# Patient Record
Sex: Male | Born: 1970 | Race: Black or African American | Hispanic: No | Marital: Married | Smoking: Never smoker
Health system: Southern US, Community
[De-identification: ages and names within clinical notes are randomized; demographics above are authoritative.]

## PROBLEM LIST (undated history)

## (undated) DIAGNOSIS — M1990 Unspecified osteoarthritis, unspecified site: Secondary | ICD-10-CM

## (undated) DIAGNOSIS — N319 Neuromuscular dysfunction of bladder, unspecified: Secondary | ICD-10-CM

## (undated) DIAGNOSIS — R7989 Other specified abnormal findings of blood chemistry: Secondary | ICD-10-CM

## (undated) DIAGNOSIS — F32A Depression, unspecified: Secondary | ICD-10-CM

## (undated) DIAGNOSIS — G709 Myoneural disorder, unspecified: Secondary | ICD-10-CM

## (undated) DIAGNOSIS — R519 Headache, unspecified: Secondary | ICD-10-CM

## (undated) DIAGNOSIS — K219 Gastro-esophageal reflux disease without esophagitis: Secondary | ICD-10-CM

## (undated) DIAGNOSIS — E119 Type 2 diabetes mellitus without complications: Secondary | ICD-10-CM

## (undated) DIAGNOSIS — R748 Abnormal levels of other serum enzymes: Secondary | ICD-10-CM

## (undated) DIAGNOSIS — G35 Multiple sclerosis: Secondary | ICD-10-CM

## (undated) DIAGNOSIS — R51 Headache: Secondary | ICD-10-CM

## (undated) DIAGNOSIS — R251 Tremor, unspecified: Secondary | ICD-10-CM

## (undated) HISTORY — PX: TESTICLE REMOVAL: SHX68

## (undated) HISTORY — PX: KNEE SURGERY: SHX244

## (undated) HISTORY — DX: Other specified abnormal findings of blood chemistry: R79.89

## (undated) HISTORY — DX: Gastro-esophageal reflux disease without esophagitis: K21.9

## (undated) HISTORY — PX: HERNIA REPAIR: SHX51

## (undated) HISTORY — PX: COLONOSCOPY: SHX174

## (undated) HISTORY — DX: Unspecified osteoarthritis, unspecified site: M19.90

---

## 2003-06-23 ENCOUNTER — Encounter: Payer: Self-pay | Admitting: Emergency Medicine

## 2003-06-23 ENCOUNTER — Emergency Department (HOSPITAL_COMMUNITY): Admission: EM | Admit: 2003-06-23 | Discharge: 2003-06-23 | Payer: Self-pay | Admitting: Emergency Medicine

## 2008-05-28 ENCOUNTER — Ambulatory Visit: Payer: Self-pay | Admitting: Urology

## 2009-11-29 ENCOUNTER — Emergency Department (HOSPITAL_COMMUNITY): Admission: EM | Admit: 2009-11-29 | Discharge: 2009-11-29 | Payer: Self-pay | Admitting: Emergency Medicine

## 2009-12-04 ENCOUNTER — Emergency Department (HOSPITAL_COMMUNITY): Admission: EM | Admit: 2009-12-04 | Discharge: 2009-12-04 | Payer: Self-pay | Admitting: Emergency Medicine

## 2009-12-04 IMAGING — CT CT MAXILLOFACIAL W/ CM
3 of 4 series · 15 of 47 positions shown, 18 images · non-contrast
Comparison: None.

CT HEAD

CLINICAL DATA: Headache.  Weakness.  Pain.  Maxillary pain.
Immunosuppression.

CT HEAD WITHOUT CONTRAST
CT MAXILLOFACIAL WITHOUT CONTRAST
TECHNIQUE: Multidetector CT imaging of the head and maxillofacial
structures were performed using the standard protocol without
intravenous contrast. Multiplanar CT image reconstructions of the
maxillofacial structures were also generated.

[Series 4: max st 2.0 h31s · axial · 0.37mm/px · z∈[+42,+180]mm · 9 of 87 slices shown, 12 images]
[im 9/87  brain]
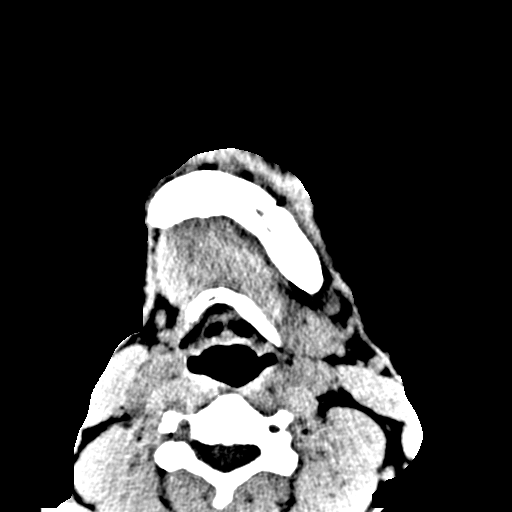
[im 9/87  bone]
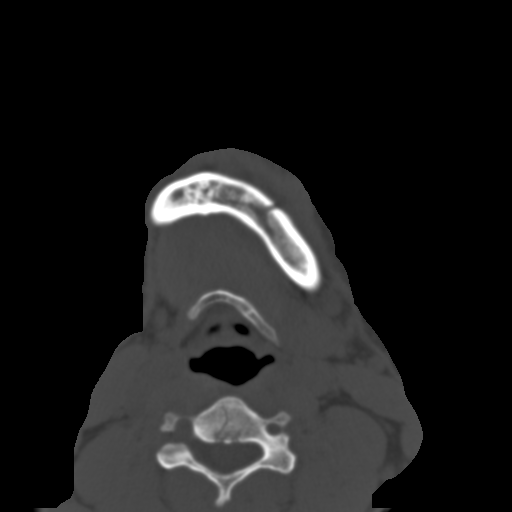
[im 18/87  bone]
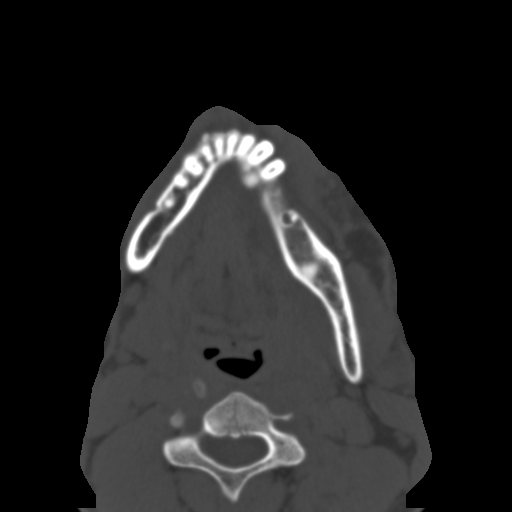
[im 26/87  bone]
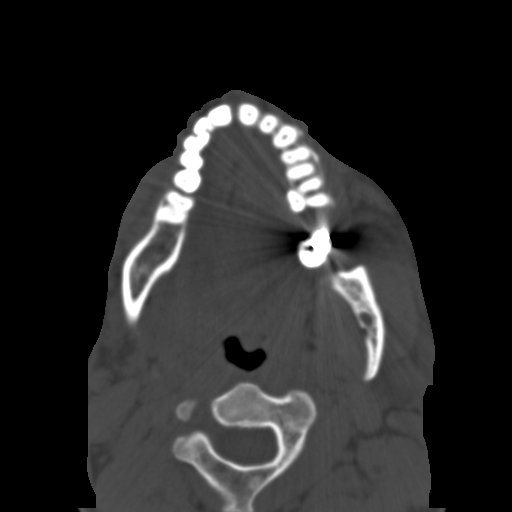
[im 35/87  bone]
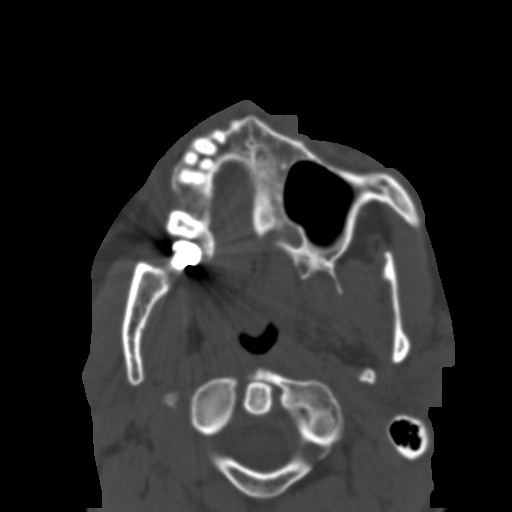
[im 44/87  brain]
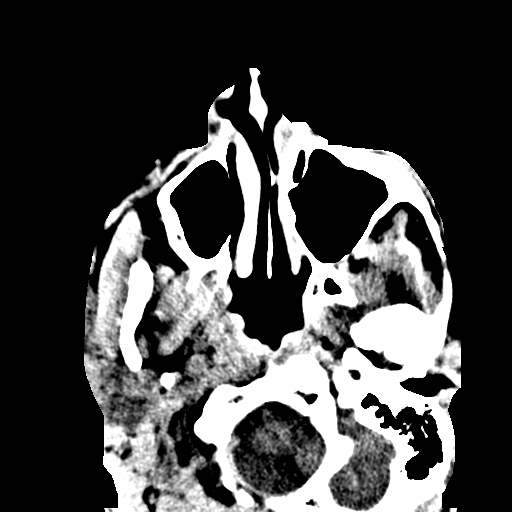
[im 44/87  bone]
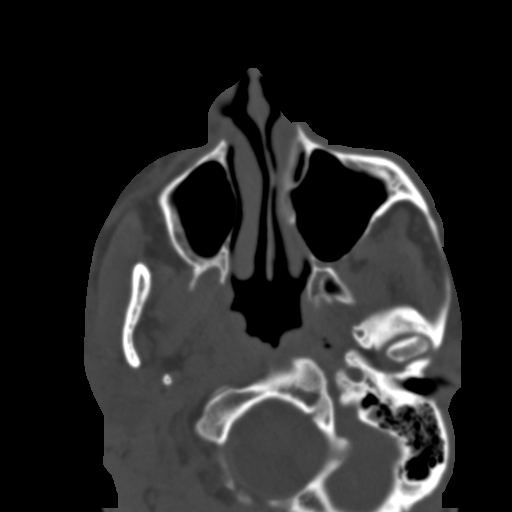
[im 52/87  bone]
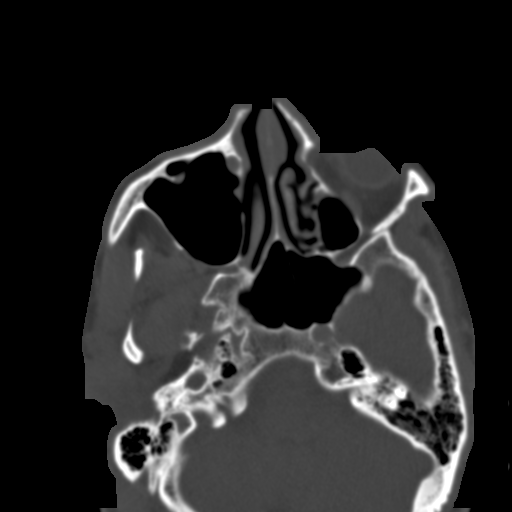
[im 61/87  bone]
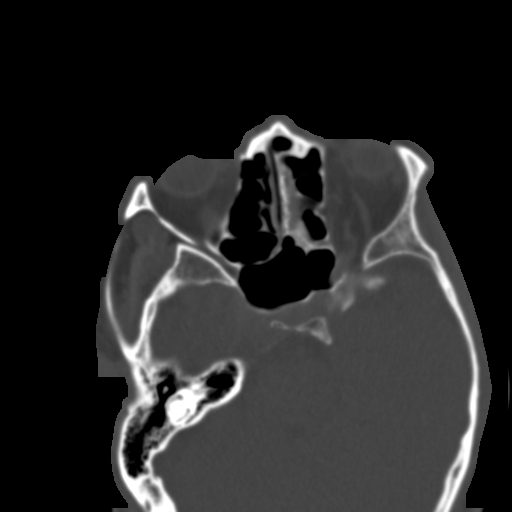
[im 69/87  bone]
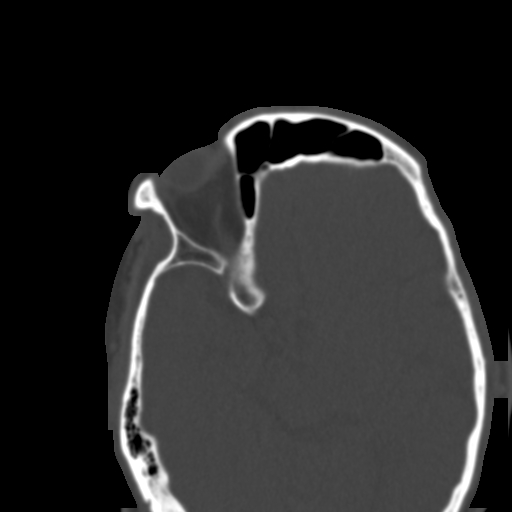
[im 78/87  brain]
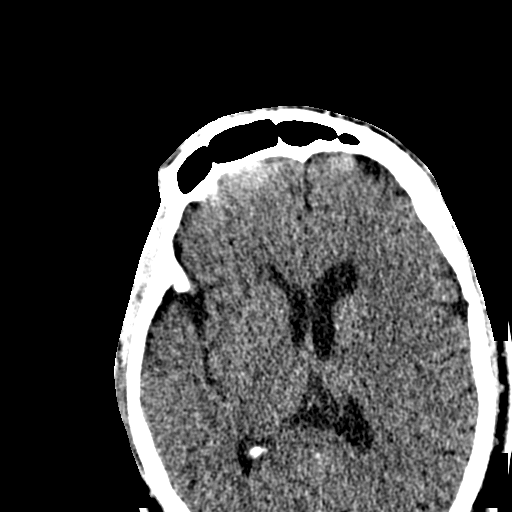
[im 78/87  bone]
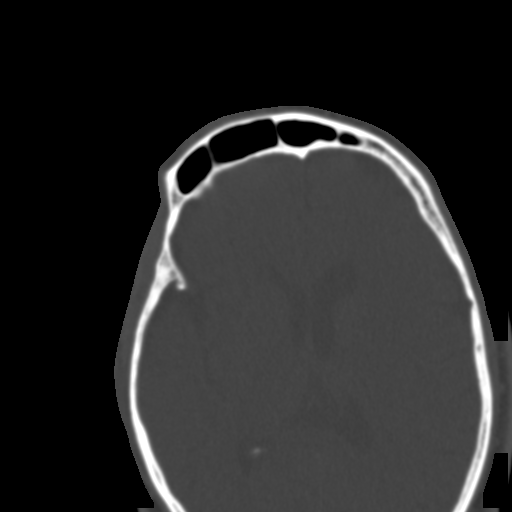

[Series 6: max st coronal · coronal · 0.33mm/px · 3 of 63 slices shown]
[im 21/63  bone]
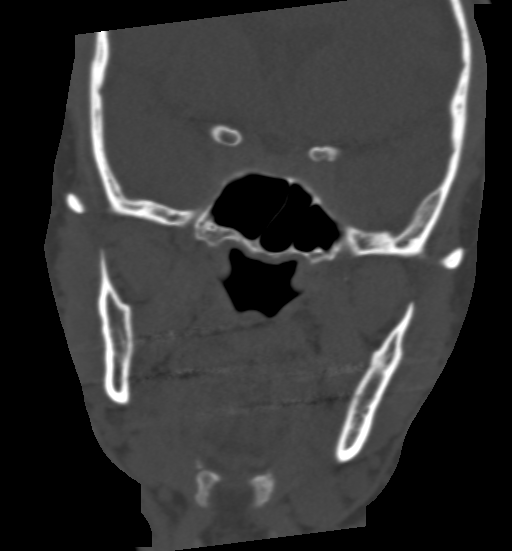
[im 28/63  bone]
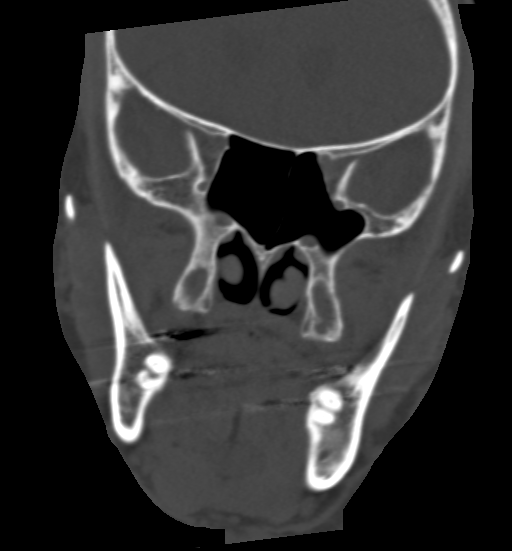
[im 35/63  bone]
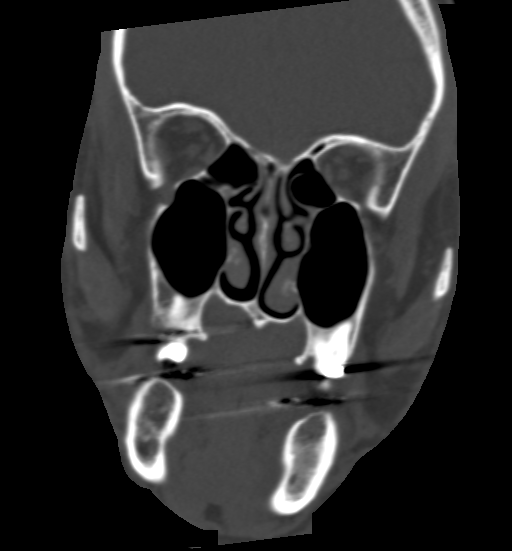

[Series 8: max st sag · sagittal · 0.27mm/px · 3 of 76 slices shown]
[im 30/76  bone]
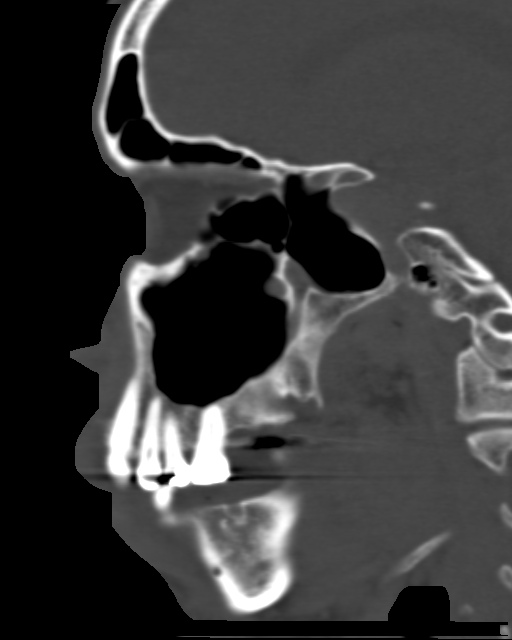
[im 38/76  bone]
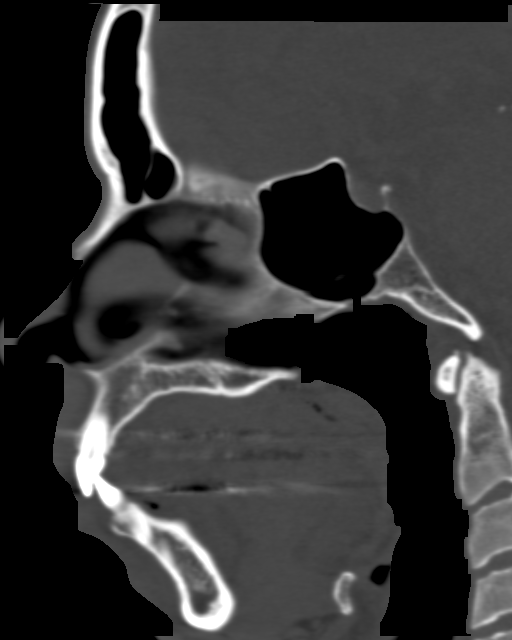
[im 46/76  bone]
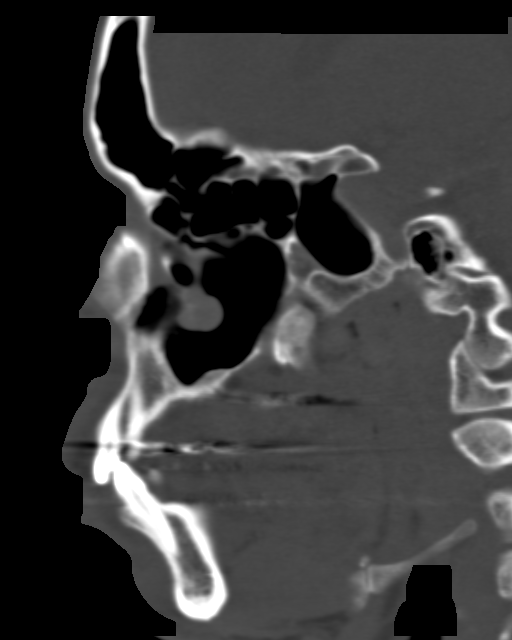

[15 of 47 positions shown; findings below may reference images not displayed]

FINDINGS: Small white matter lesion is present in the anterior
right frontal lobe.   No mass lesion, mass effect, midline shift,
hydrocephalus, hemorrhage.  No territorial ischemia or acute
infarction. Paranasal sinuses appear within normal limits aside
from right mucous retention cyst or polyp in the maxillary sinus.
IMPRESSION: No acute intracranial abnormality.  Small right frontal white
matter lesion is nonspecific.

CT MAXILLOFACIAL
FINDINGS: Right maxillary mucous retention cyst or polyp is
present.  The frontal sinuses appear clear.  Mild left maxillary
mucosal thickening.  Sphenoid sinuses unremarkable.  Mastoid air
cells clear.  No significant inflammatory changes.  Evaluation of
adenopathy suboptimal secondary to noncontrast technique.  Dental
artifact is present over the mandible and maxilla.  No osteolysis.
No focal inflammatory changes or masses identified.
IMPRESSION: No acute abnormality on the unenhanced CT maxillofacial. Paranasal
sinus disease is mild.

## 2010-08-12 ENCOUNTER — Emergency Department (HOSPITAL_COMMUNITY): Admission: EM | Admit: 2010-08-12 | Discharge: 2010-08-12 | Payer: Self-pay | Admitting: Emergency Medicine

## 2010-08-12 IMAGING — CT CT ABD-PELV W/ CM
2 of 4 series · 16 of 46 positions shown, 18 images · IV contrast (Omnipaque 300)
Comparison: None

CLINICAL DATA: Right flank and right lower quadrant pain for 2 days

CT ABDOMEN AND PELVIS WITH CONTRAST
TECHNIQUE: Multidetector CT imaging of the abdomen and pelvis was
performed following the standard protocol during bolus
administration of intravenous contrast. Breast shield utilized.
Sagittal and coronal MPR images reconstructed from axial data set.
Contrast: Dilute oral contrast and 100 ml [X8] IV

[Series 2: abd_pel_with 5.0 b40f · axial · 0.71mm/px · z∈[-502,-97]mm · 13 of 89 slices shown, 15 images]
[im 4/89  soft-tissue]
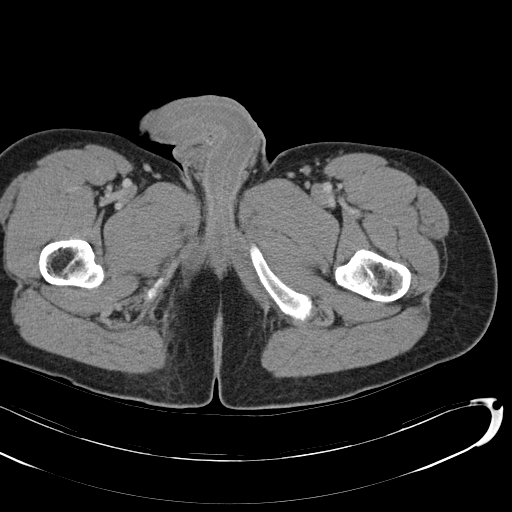
[im 4/89  bone]
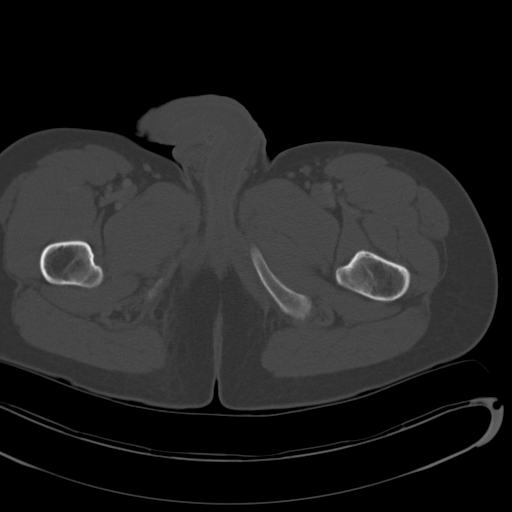
[im 11/89  soft-tissue]
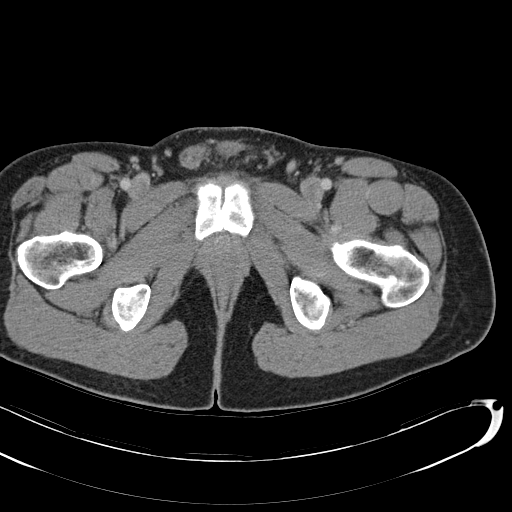
[im 17/89  soft-tissue]
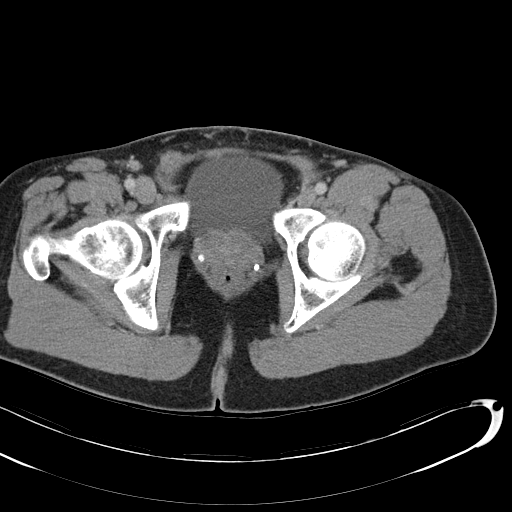
[im 24/89  soft-tissue]
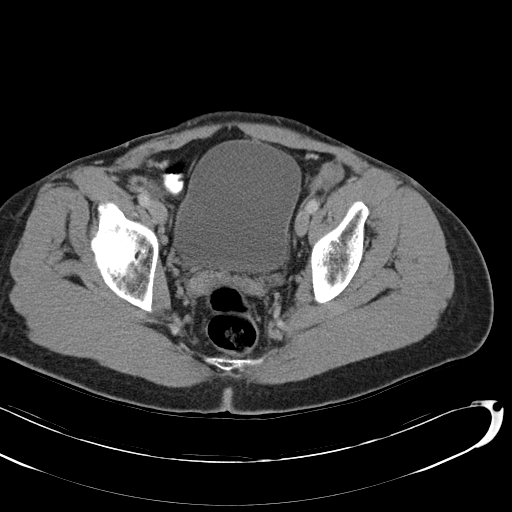
[im 31/89  soft-tissue]
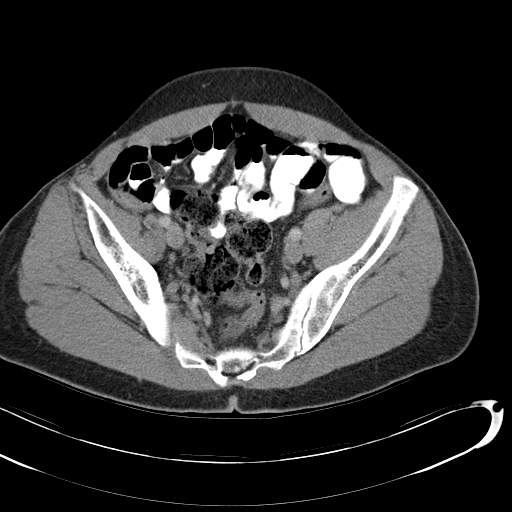
[im 38/89  soft-tissue]
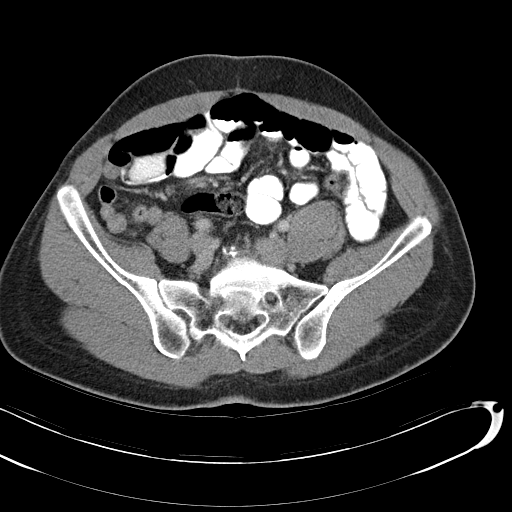
[im 45/89  soft-tissue]
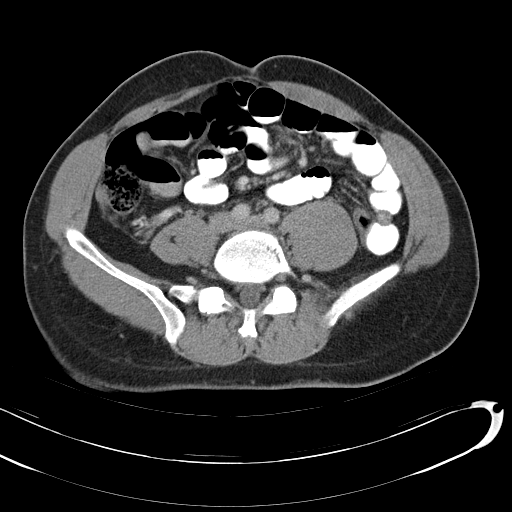
[im 51/89  soft-tissue]
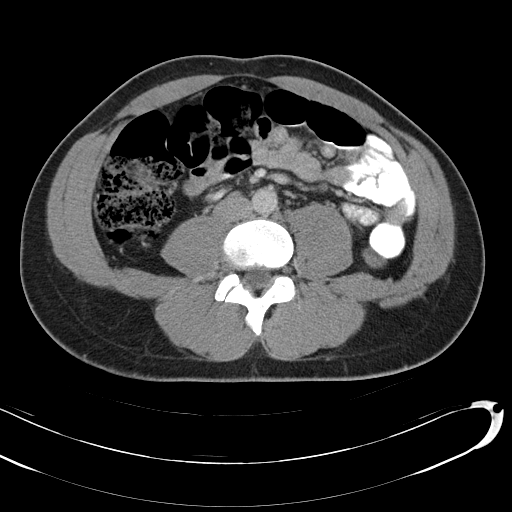
[im 58/89  soft-tissue]
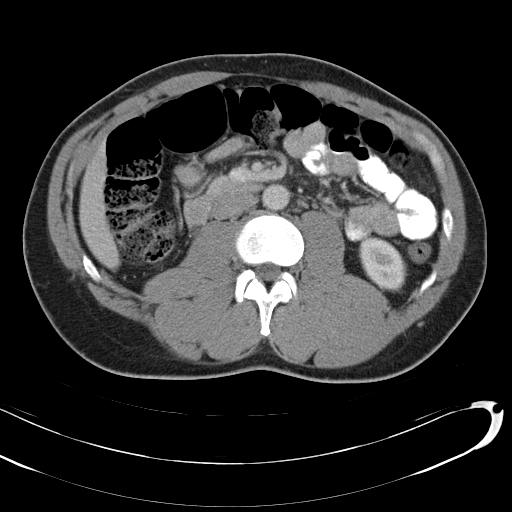
[im 58/89  bone]
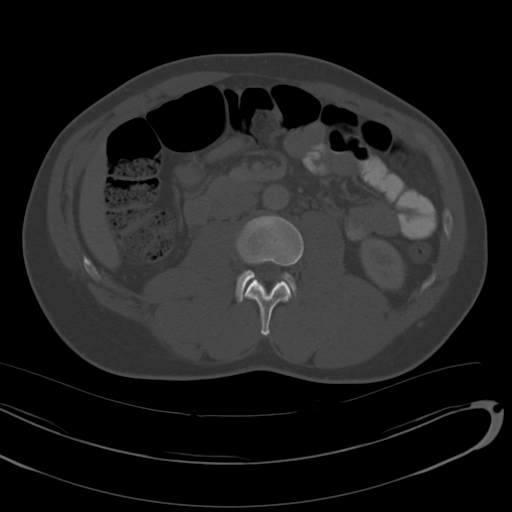
[im 65/89  soft-tissue]
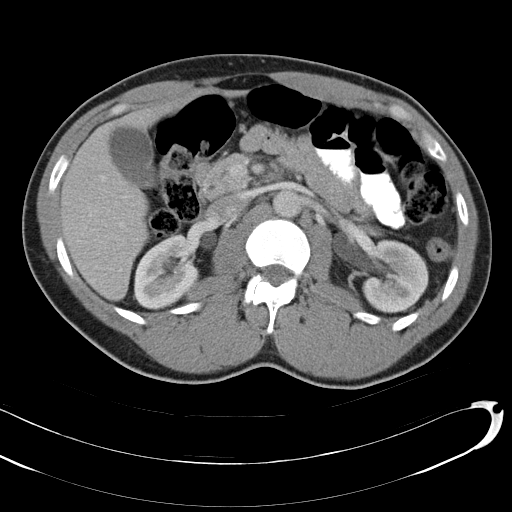
[im 72/89  soft-tissue]
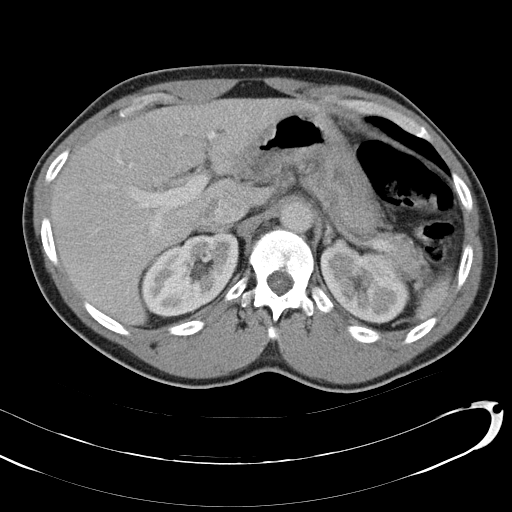
[im 78/89  soft-tissue]
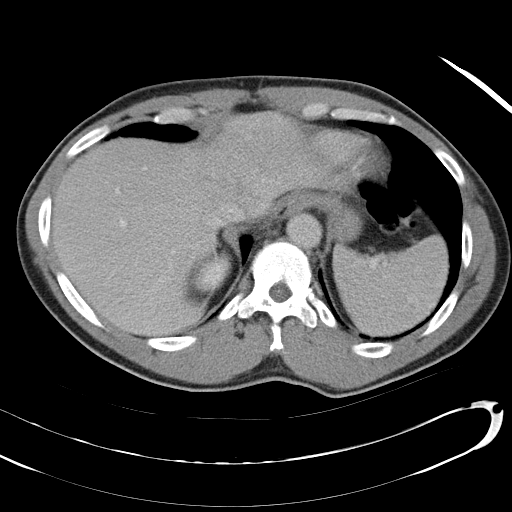
[im 85/89  soft-tissue]
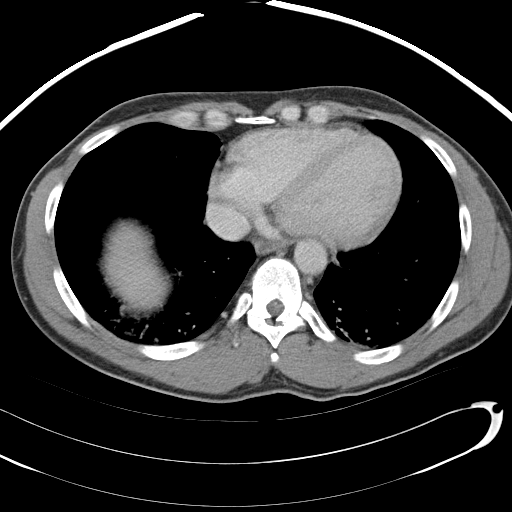

[Series 4: abd_pel_with 3.0 spo cor · coronal · 0.68mm/px · 3 of 80 slices shown]
[im 27/80  soft-tissue]
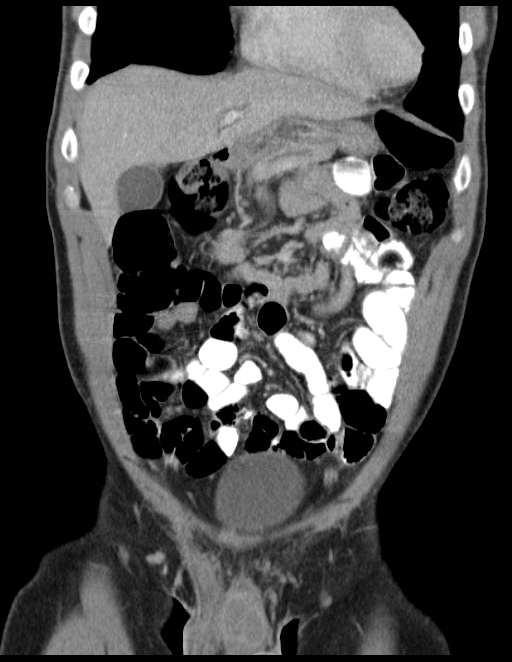
[im 36/80  soft-tissue]
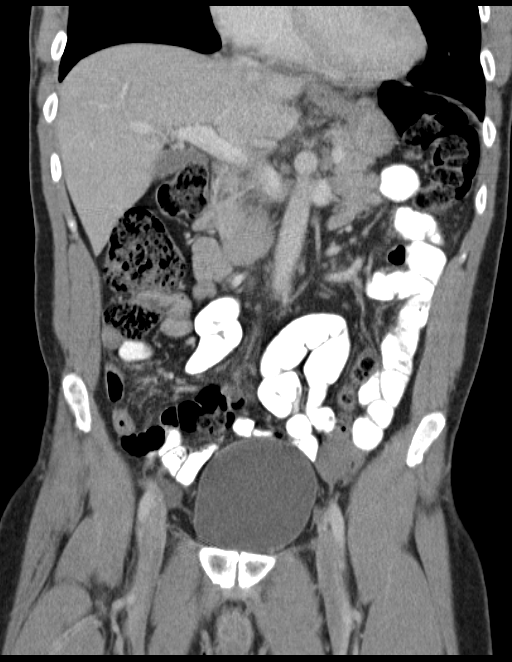
[im 44/80  soft-tissue]
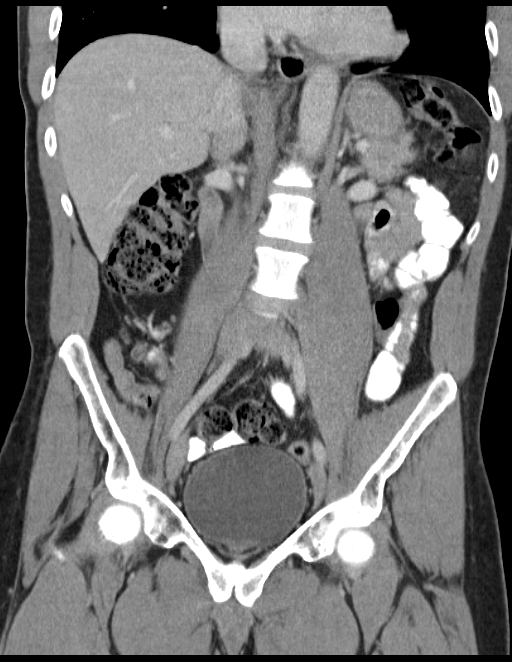

[16 of 46 positions shown; findings below may reference images not displayed]

FINDINGS: Dependent atelectasis bilateral lung bases.
Liver, spleen, pancreas, kidneys, and adrenal glands normal
appearance.
Distal ureters and bladder unremarkable.
Normal appendix, retrocecal in position.
Small umbilical hernia containing fat.
Single small bowel loop in the right lower quadrant shows
questionable wall thickening versus underdistension artifact.

Small collection of low attenuation fluid identified deep to right
internal inguinal ring, 2.8 x 1.3 cm image 68, with minimal
stranding at the inguinal fascial planes, question prior hernia
repair.
Soft tissue nodules identified in the left lower quadrant/mid left
pelvis, largest 3.0 x 2.4 cm image 63, question enlarged left
external iliac lymph nodes; these are not of fluid attenuation and
are not felt represent cysts.
Scattered pelvic phleboliths.
No additional mass, free fluid or inflammatory process.
No acute osseous findings.
IMPRESSION: Small focal fluid collection deep to right internal inguinal ring,
question prior hernia repair.
Suspect enlarged left external iliac lymph nodes, uncertain
etiology; consider follow-up imaging in 4-6 months to assess
stability.
Segment of questionable small bowel wall thickening of a loop of
distal ileum, cannot exclude infection or inflammatory bowel
disease versus artifact from underdistension.
Bibasilar atelectasis.
Small umbilical hernia containing fat.

## 2010-12-09 LAB — DIFFERENTIAL
Basophils Absolute: 0 10*3/uL (ref 0.0–0.1)
Basophils Relative: 0 % (ref 0–1)
Eosinophils Absolute: 0 10*3/uL (ref 0.0–0.7)
Eosinophils Relative: 0 % (ref 0–5)
Lymphocytes Relative: 3 % — ABNORMAL LOW (ref 12–46)
Lymphs Abs: 0.1 10*3/uL — ABNORMAL LOW (ref 0.7–4.0)
Monocytes Absolute: 0.5 10*3/uL (ref 0.1–1.0)
Monocytes Relative: 10 % (ref 3–12)
Neutro Abs: 4.8 10*3/uL (ref 1.7–7.7)
Neutrophils Relative %: 87 % — ABNORMAL HIGH (ref 43–77)

## 2010-12-09 LAB — CBC
HCT: 38.9 % — ABNORMAL LOW (ref 39.0–52.0)
Hemoglobin: 12.8 g/dL — ABNORMAL LOW (ref 13.0–17.0)
MCH: 28.4 pg (ref 26.0–34.0)
MCHC: 32.9 g/dL (ref 30.0–36.0)
MCV: 86.2 fL (ref 78.0–100.0)
Platelets: 161 10*3/uL (ref 150–400)
RBC: 4.52 MIL/uL (ref 4.22–5.81)
RDW: 14.4 % (ref 11.5–15.5)
WBC: 5.4 10*3/uL (ref 4.0–10.5)

## 2010-12-09 LAB — URINALYSIS, ROUTINE W REFLEX MICROSCOPIC
Bilirubin Urine: NEGATIVE
Glucose, UA: NEGATIVE mg/dL
Hgb urine dipstick: NEGATIVE
Ketones, ur: NEGATIVE mg/dL
Nitrite: NEGATIVE
Protein, ur: NEGATIVE mg/dL
Specific Gravity, Urine: 1.015 (ref 1.005–1.030)
Urobilinogen, UA: 2 mg/dL — ABNORMAL HIGH (ref 0.0–1.0)
pH: 6.5 (ref 5.0–8.0)

## 2010-12-09 LAB — BASIC METABOLIC PANEL
BUN: 13 mg/dL (ref 6–23)
CO2: 28 mEq/L (ref 19–32)
Calcium: 9.3 mg/dL (ref 8.4–10.5)
Chloride: 106 mEq/L (ref 96–112)
Creatinine, Ser: 1.49 mg/dL (ref 0.4–1.5)
GFR calc Af Amer: >60 mL/min (ref >60)
GFR calc non Af Amer: 53 mL/min — ABNORMAL LOW (ref >60)
Glucose, Bld: 92 mg/dL (ref 70–99)
Potassium: 4.2 mEq/L (ref 3.5–5.1)
Sodium: 140 mEq/L (ref 135–145)

## 2010-12-21 LAB — POCT I-STAT, CHEM 8
BUN: 13 mg/dL (ref 6–23)
Calcium, Ion: 1.15 mmol/L (ref 1.12–1.32)
Chloride: 105 mEq/L (ref 96–112)
Creatinine, Ser: 1.3 mg/dL (ref 0.4–1.5)
Glucose, Bld: 104 mg/dL — ABNORMAL HIGH (ref 70–99)
HCT: 44 % (ref 39.0–52.0)
Hemoglobin: 15 g/dL (ref 13.0–17.0)
Potassium: 3.7 mEq/L (ref 3.5–5.1)
Sodium: 138 mEq/L (ref 135–145)
TCO2: 26 mmol/L (ref 0–100)

## 2011-01-13 ENCOUNTER — Emergency Department (HOSPITAL_COMMUNITY)
Admission: EM | Admit: 2011-01-13 | Discharge: 2011-01-14 | Disposition: A | Discharge status: Home / Self Care | Payer: Medicare Other | Attending: Emergency Medicine | Admitting: Emergency Medicine

## 2011-01-13 DIAGNOSIS — Z79899 Other long term (current) drug therapy: Secondary | ICD-10-CM | POA: Insufficient documentation

## 2011-01-13 DIAGNOSIS — R300 Dysuria: Secondary | ICD-10-CM | POA: Insufficient documentation

## 2011-01-13 DIAGNOSIS — G35 Multiple sclerosis: Secondary | ICD-10-CM | POA: Insufficient documentation

## 2011-01-14 LAB — URINALYSIS, ROUTINE W REFLEX MICROSCOPIC
Bilirubin Urine: NEGATIVE
Glucose, UA: NEGATIVE mg/dL
Hgb urine dipstick: NEGATIVE
Nitrite: NEGATIVE
Protein, ur: NEGATIVE mg/dL
Specific Gravity, Urine: >1.03 — ABNORMAL HIGH (ref 1.005–1.030)
Urobilinogen, UA: 1 mg/dL (ref 0.0–1.0)
pH: 6 (ref 5.0–8.0)

## 2011-11-13 DIAGNOSIS — R3915 Urgency of urination: Secondary | ICD-10-CM | POA: Insufficient documentation

## 2011-11-13 DIAGNOSIS — N319 Neuromuscular dysfunction of bladder, unspecified: Secondary | ICD-10-CM | POA: Insufficient documentation

## 2012-01-13 DIAGNOSIS — L219 Seborrheic dermatitis, unspecified: Secondary | ICD-10-CM | POA: Insufficient documentation

## 2012-01-13 DIAGNOSIS — L709 Acne, unspecified: Secondary | ICD-10-CM | POA: Insufficient documentation

## 2012-09-28 HISTORY — PX: COLONOSCOPY: SHX174

## 2012-10-11 DIAGNOSIS — H5509 Other forms of nystagmus: Secondary | ICD-10-CM | POA: Insufficient documentation

## 2012-10-11 DIAGNOSIS — Z79899 Other long term (current) drug therapy: Secondary | ICD-10-CM | POA: Insufficient documentation

## 2013-01-29 DIAGNOSIS — M25561 Pain in right knee: Secondary | ICD-10-CM | POA: Insufficient documentation

## 2013-03-04 ENCOUNTER — Emergency Department (HOSPITAL_COMMUNITY)
Admission: EM | Admit: 2013-03-04 | Discharge: 2013-03-04 | Disposition: A | Discharge status: Home / Self Care | Payer: Medicare Other | Attending: Emergency Medicine | Admitting: Emergency Medicine

## 2013-03-04 ENCOUNTER — Encounter (HOSPITAL_COMMUNITY): Payer: Self-pay | Admitting: *Deleted

## 2013-03-04 DIAGNOSIS — S0180XA Unspecified open wound of other part of head, initial encounter: Secondary | ICD-10-CM | POA: Insufficient documentation

## 2013-03-04 DIAGNOSIS — Y9239 Other specified sports and athletic area as the place of occurrence of the external cause: Secondary | ICD-10-CM | POA: Insufficient documentation

## 2013-03-04 DIAGNOSIS — Y92838 Other recreation area as the place of occurrence of the external cause: Secondary | ICD-10-CM | POA: Insufficient documentation

## 2013-03-04 DIAGNOSIS — Z8669 Personal history of other diseases of the nervous system and sense organs: Secondary | ICD-10-CM | POA: Insufficient documentation

## 2013-03-04 DIAGNOSIS — W208XXA Other cause of strike by thrown, projected or falling object, initial encounter: Secondary | ICD-10-CM | POA: Insufficient documentation

## 2013-03-04 DIAGNOSIS — S0181XA Laceration without foreign body of other part of head, initial encounter: Secondary | ICD-10-CM

## 2013-03-04 DIAGNOSIS — Y93B1 Activity, exercise machines primarily for muscle strengthening: Secondary | ICD-10-CM | POA: Insufficient documentation

## 2013-03-04 HISTORY — DX: Headache: R51

## 2013-03-04 HISTORY — DX: Tremor, unspecified: R25.1

## 2013-03-04 HISTORY — DX: Multiple sclerosis: G35

## 2013-03-04 HISTORY — DX: Headache, unspecified: R51.9

## 2013-03-04 MED ORDER — LIDOCAINE HCL (PF) 1 % IJ SOLN
5.0000 mL | Freq: Once | INTRAMUSCULAR | Status: AC
  Administered 2013-03-04: 5 mL
  Filled 2013-03-04: qty 5

## 2013-03-04 MED ORDER — HYDROCODONE-ACETAMINOPHEN 5-325 MG PO TABS: 1.0000 | ORAL_TABLET | ORAL | Status: DC | PRN

## 2013-03-04 MED ORDER — LIDOCAINE-EPINEPHRINE-TETRACAINE (LET) SOLUTION
3.0000 mL | Freq: Once | NASAL | Status: AC
  Administered 2013-03-04: 3 mL via TOPICAL
  Filled 2013-03-04: qty 3

## 2013-03-04 NOTE — ED Notes (Signed)
Laceration to chin after weights fell on chin.  Gauze applied at this time with bleeding.

## 2013-03-04 NOTE — ED Provider Notes (Signed)
History     CSN: [id number]  Arrival date & time 03/04/13  1126   First MD Initiated Contact with Patient 03/04/13 1220      Chief Complaint  Patient presents with  . Laceration    (Consider location/radiation/quality/duration/timing/severity/associated sxs/prior treatment) HPI JEDREK DINOVO is a 42 y.o. male who presents to the ED with a laceration to his chin. He went to his doctor this morning and they told him to come to the ED. He has MS and goes to the Gym every day to try and keep his muscles working. Today he was working with a machine and it fell against is chin and caused a laceration. He is not sure if his teeth went through his chin or not. He denies any other injuries.   Past Medical History  Diagnosis Date  . MS (multiple sclerosis)   . Headache   . Tremor     Past Surgical History  Procedure Laterality Date  . Knee surgery    . Hernia repair      No family history on file.  History  Substance Use Topics  . Smoking status: Never Smoker   . Smokeless tobacco: Not on file  . Alcohol Use: No      Review of Systems  Constitutional: Negative for fever and chills.  HENT: Negative for neck pain.   Respiratory: Negative for chest tightness and shortness of breath.   Gastrointestinal: Negative for nausea and vomiting.  Musculoskeletal:       Hx of MS  Skin: Positive for wound.  Psychiatric/Behavioral: The patient is not nervous/anxious.     Allergies  Celebrex; Celexa; and Vioxx  Home Medications  No current outpatient prescriptions on file.  BP 119/96  Pulse 72  Temp(Src) 97.4 F (36.3 C) (Oral)  Resp 14  Ht 6\' 3"  (1.905 m)  Wt 212 lb (96.163 kg)  BMI 26.5 kg/m2  SpO2 100%  Physical Exam  Nursing note and vitals reviewed. Constitutional: He is oriented to person, place, and time. He appears well-developed and well-nourished. No distress.  HENT:  Head: Head is with laceration.    Laceration inside bottom lip, through and through    Eyes: EOM are normal.  Neck: Neck supple.  Cardiovascular: Normal rate.   Pulmonary/Chest: Effort normal.  Musculoskeletal: Normal range of motion.  Neurological: He is alert and oriented to person, place, and time. No cranial nerve deficit.  Skin: Skin is warm and dry.  Psychiatric: He has a normal mood and affect. His behavior is normal.    ED Course  Procedures (including critical care time) LACERATION REPAIR Performed by: Trystin Hargrove Authorized by: Darrill Vreeland Consent: Verbal consent obtained. Risks and benefits: risks, benefits and alternatives were discussed Consent given by: patient Patient identity confirmed: provided demographic data Prepped and Draped in normal sterile fashion Wound explored  Laceration Location: chin   Laceration Length: 1.5 cm  No Foreign Bodies seen or palpated  Anesthesia: local infiltration  Local anesthetic: lidocaine 1% without epinephrine  Anesthetic total: 3 ml  Irrigation method: syringe Amount of cleaning: standard  Skin closure: 5-0 prolene  Number of sutures: 5  Technique: single interrupted  Patient tolerance: Patient tolerated the procedure well with no immediate complications.    MDM: Dr. Adriana Simas in to examine the patient.  42 y.o. male with through and through laceration to chin. He will follow up with his PCP in 5 days for suture removal. He will use warm salt water rinses for the laceration inside the mouth.  He will return for any problems.  Discussed with the patient and all questioned fully answered.   Medication List    TAKE these medications       HYDROcodone-acetaminophen 5-325 MG per tablet  Commonly known as:  NORCO/VICODIN  Take 1 tablet by mouth every 4 (four) hours as needed.               [facility] Orlene Och, NP 03/04/13 1710

## 2013-03-04 NOTE — ED Notes (Signed)
Pt lost balance while holding on to weight machine, machine came down and hit pt's chin with fall, denies hitting head, lac to chin/lower lip that went all the way through

## 2013-03-06 NOTE — ED Provider Notes (Signed)
Medical screening examination/treatment/procedure(s) were conducted as a shared visit with non-physician practitioner(s) and myself.  I personally evaluated the patient during the encounter.  No neuro deficits.  Lac repair per NP  Donnetta Hutching, MD 03/06/13 1125

## 2013-05-16 DIAGNOSIS — Q5310 Unspecified undescended testicle, unilateral: Secondary | ICD-10-CM | POA: Insufficient documentation

## 2014-03-20 DIAGNOSIS — G894 Chronic pain syndrome: Secondary | ICD-10-CM | POA: Insufficient documentation

## 2014-10-12 ENCOUNTER — Ambulatory Visit (HOSPITAL_COMMUNITY)
Admission: RE | Admit: 2014-10-12 | Discharge: 2014-10-12 | Disposition: A | Discharge status: Home / Self Care | Payer: Medicare Other | Source: Ambulatory Visit | Attending: Neurology | Admitting: Neurology

## 2014-10-12 DIAGNOSIS — M25659 Stiffness of unspecified hip, not elsewhere classified: Secondary | ICD-10-CM | POA: Diagnosis not present

## 2014-10-12 DIAGNOSIS — R29898 Other symptoms and signs involving the musculoskeletal system: Secondary | ICD-10-CM

## 2014-10-12 DIAGNOSIS — R198 Other specified symptoms and signs involving the digestive system and abdomen: Secondary | ICD-10-CM

## 2014-10-12 DIAGNOSIS — R2689 Other abnormalities of gait and mobility: Secondary | ICD-10-CM | POA: Insufficient documentation

## 2014-10-12 DIAGNOSIS — Z9181 History of falling: Secondary | ICD-10-CM | POA: Diagnosis not present

## 2014-10-12 DIAGNOSIS — M6281 Muscle weakness (generalized): Secondary | ICD-10-CM | POA: Insufficient documentation

## 2014-10-12 DIAGNOSIS — G35 Multiple sclerosis: Secondary | ICD-10-CM | POA: Insufficient documentation

## 2014-10-12 NOTE — Therapy (Signed)
[facility] [address] [place], [place], [address] Phone: [phone]   Fax:  [phone]  Physical Therapy Evaluation  Patient Details  Name: Vincent Anthony MRN: [id number] Date of Birth: 10-20-1970 Referring Provider:  Harlen Labs, MD  Encounter Date: 10/12/2014      PT End of Session - 10/12/14 1537    Visit Number 1   Number of Visits 16   Date for PT Re-Evaluation 11/11/14   Authorization Type medicare   Authorization - Visit Number 1   Authorization - Number of Visits 16   PT Start Time 1445   PT Stop Time 1530   PT Time Calculation (min) 45 min   Equipment Utilized During Treatment Gait belt   Activity Tolerance Patient tolerated treatment well   Behavior During Therapy [facility] for tasks assessed/performed      Past Medical History  Diagnosis Date  . MS (multiple sclerosis)   . Headache   . Tremor     Past Surgical History  Procedure Laterality Date  . Knee surgery    . Hernia repair      There were no vitals taken for this visit.  Visit Diagnosis:  Multiple sclerosis  Hip stiffness, unspecified laterality  Weakness of right lower extremity  Weakness of left hip  Abdominal weakness  At risk for injury related to fall      Subjective Assessment - 10/12/14 1451    Pertinent History Patient arrives with promary complaint of LE weakness and trunk instability secondary to MS placing patient at ioncreased risk for falls. noted muscle spasms jin rt knee brac3 for which he has an knee brace and AFO to prevent Rt knee from hyper extending. Notes the cold stiffnes up his leg more.    How long can you walk comfortably? 15 minutes before wearing out. uses a cart for getting groceries can walk 20 minutes. .    Patient Stated Goals Balance: wants to be able to stand withtou needing UE supprot and being able to tolerate outside pushing.,  to be able to perofrm sit to stand without UE assistance or plopping. improve  gait efficiency to be able to ambulate >72minutes.    Pain Score 3    Pain Location Knee   Pain Orientation Right;Left;Anterior;Medial;Lateral   Pain Descriptors / Indicators Aching;Dull;Sharp          [facility] PT Assessment - 10/12/14 0001    Assessment   Medical Diagnosis decreased balance, difficulty walking and LE weakness secondary to MS.    Onset Date 02/15/00   Next MD Visit Harlen Labs, Leane Para 11/02/14   Prior Therapy Yes, 2014   Balance Screen   Has the patient fallen in the past 6 months Yes   How many times? 1  rain and slipped on floor   Has the patient had a decrease in activity level because of a fear of falling?  No   Is the patient reluctant to leave their home because of a fear of falling?  No   Prior Function   Level of Independence Independent with basic ADLs;Needs assistance with ADLs   Cognition   Overall Cognitive Status Within Functional Limits for tasks assessed   Functional Tests   Functional tests Sit to Stand;Other;Other2   Sit to Stand   Comments 22" no difficulty, 18.5": limited control with fall last 4 inches.  unable to stand withtou UE assistance   Other:   Other/ Comments Standing balance:    Other:  Other/Comments 3D hip excursions: requires UE support for maintenance of balance   Strength   Right Hip Flexion 0/5   Right Hip Extension 0/5   Right Hip ABduction 0/5   Left Hip Flexion 3+/5   Left Hip Extension 2/5   Left Hip ABduction 2/5   Right Knee Flexion 0/5   Right Knee Extension 0/5   Left Knee Flexion 4-/5   Left Knee Extension 4+/5   Right Ankle Dorsiflexion 0/5   Right Ankle Plantar Flexion 0/5   Left Ankle Dorsiflexion 4+/5   Left Ankle Plantar Flexion 2+/5   Lumbar Flexion 2+/5                          PT Education - 10/12/14 1537    Education provided Yes   Education Details HEP 3D hip excursionse, sit to stand technique   Person(s) Educated Patient;Spouse   Methods  Explanation;Demonstration;Tactile cues;Handout;Verbal cues   Comprehension Verbalized understanding;Returned demonstration          PT Short Term Goals - 10/12/14 1548    PT SHORT TERM GOAL #1   Title Patient will be able ot perfom sit to stand from 18" chair without UE assistance   Time 4   Period Weeks   Status New   PT SHORT TERM GOAL #2   Title Patient will be able to stand >1 minute withotu any UE assistance or assistive device   Time 4   Period Weeks   Status New   PT SHORT TERM GOAL #3   Title Patient will demonstrate increased Lt hamstring strength to 4+/5 to improve gait stability   Time 4   Period Weeks   Status New   PT SHORT TERM GOAL #4   Title patient will be bale to perform walk test withtou rest   Time 4   Period Weeks   Status New           PT Long Term Goals - 10/12/14 1549    PT LONG TERM GOAL #1   Title Patient will be able ot perfom sit to stand from 18" chair without UE assistance 5x in less than 20 seconds indicatign improving LE power   Time 8   Period Weeks   Status New   PT LONG TERM GOAL #2   Title Patient will be able to bend over and pick up 5 lb from the floor independently indicating improved function trunk stability and 4/5 MMT fot abdominal strength   Baseline 2/5   Time 8   Period Weeks   Status New   PT LONG TERM GOAL #3   Title Patient will dmeosntrate fast gait speed of 1.98m/s to indicate patient can ambualte at comminuty ambulatory status speeds when needed.    Time 8   Period Weeks   Status New               Plan - 10/12/14 1542    Clinical Impression Statement Patient displays decreased balance and coordination attributed to deconditioning and diagnosis of multiple sclerosis resulting in minimal to no Rt LE muscle acitvation dififculty walkign and increased risk of fall. Patient will benfit from skilled phsyical therapy to  increase balance, decrease risk of falls , improve transfers from sit to stand, and  increased gait efficiency and endurance.    PT Next Visit Plan Perform 6 minute walk test, 10 ft gait speed (convert to meters persecond)  and TUG to obtain balance  and gait measures. Initial focus on increasing abdominal strength utilizing bed mobility activities, and improvign sit to stand transfer visa 3D hip excursions and squats.  Introduce hip flexor and hamstringstretch as well.    PT Home Exercise Plan 3D hip excursions and sit to stand.    Recommended Other Services Patient would benefit from Occupational therapy to address Lt hand weakness and contractures.    Consulted and Agree with Plan of Care Patient          G-Codes - 10-14-2014 1553    Functional Assessment Tool Used FOTO 57% limited   Functional Limitation Mobility: Walking and moving around   Mobility: Walking and Moving Around Current Status [phone]) At least 40 percent but less than 60 percent impaired, limited or restricted   Mobility: Walking and Moving Around Goal Status [phone]) At least 20 percent but less than 40 percent impaired, limited or restricted       Problem List There are no active problems to display for this patient.   Doyne Keel 2014-10-14, 3:58 PM  [facility] [address] [place], [place], [address] Phone: [phone]   Fax:  [phone]

## 2014-10-19 ENCOUNTER — Ambulatory Visit (HOSPITAL_COMMUNITY): Payer: Medicare Other

## 2014-10-24 ENCOUNTER — Ambulatory Visit (HOSPITAL_COMMUNITY)
Admission: RE | Admit: 2014-10-24 | Discharge: 2014-10-24 | Disposition: A | Discharge status: Home / Self Care | Payer: Medicare Other | Source: Ambulatory Visit | Attending: Neurology | Admitting: Neurology

## 2014-10-24 DIAGNOSIS — M25659 Stiffness of unspecified hip, not elsewhere classified: Secondary | ICD-10-CM

## 2014-10-24 DIAGNOSIS — R29898 Other symptoms and signs involving the musculoskeletal system: Secondary | ICD-10-CM

## 2014-10-24 DIAGNOSIS — G35 Multiple sclerosis: Secondary | ICD-10-CM

## 2014-10-24 DIAGNOSIS — Z9181 History of falling: Secondary | ICD-10-CM

## 2014-10-24 DIAGNOSIS — M6281 Muscle weakness (generalized): Secondary | ICD-10-CM | POA: Diagnosis not present

## 2014-10-24 DIAGNOSIS — R198 Other specified symptoms and signs involving the digestive system and abdomen: Secondary | ICD-10-CM

## 2014-10-24 NOTE — Therapy (Signed)
[facility] [address] [place], [place], [address] Phone: [phone]   Fax:  [phone]  Physical Therapy Treatment  Patient Details  Name: Vincent Anthony MRN: [id number] Date of Birth: [birth year] Referring Provider:  Harlen Labs, MD  Encounter Date: 10/24/2014      PT End of Session - 10/24/14 1340    Visit Number 2   Number of Visits 16   Date for PT Re-Evaluation 11/11/14   Authorization Type medicare   Authorization - Visit Number 2   Authorization - Number of Visits 16   PT Start Time 1301   PT Stop Time 1346   PT Time Calculation (min) 45 min      Past Medical History  Diagnosis Date  . MS (multiple sclerosis)   . Headache   . Tremor     Past Surgical History  Procedure Laterality Date  . Knee surgery    . Hernia repair      There were no vitals taken for this visit.  Visit Diagnosis:  Multiple sclerosis  Hip stiffness, unspecified laterality  Weakness of right lower extremity  Weakness of left hip  Abdominal weakness  At risk for injury related to fall        [facility] PT Assessment - 10/24/14 0001    Assessment   Medical Diagnosis decreased balance, difficulty walking and LE weakness secondary to MS.    Onset Date 02/15/00   Next MD Visit Harlen Labs, Suzette Battiest Abbott 11/02/14   Prior Therapy Yes, 2014   Observation/Other Assessments   Observations walk: 116ft   Focus on Therapeutic Outcomes (FOTO)  57% limited   6 Minute Walk- Baseline   6 Minute Walk- Baseline yes   Standardized Balance Assessment   Standardized Balance Assessment Timed Up and Go Test   Timed Up and Go Test   Normal TUG (seconds) 80   TUG Comments Significant difficulty in balance tasks and navigating around bolster; muscle tremors and lagging of R leg noted            [facility] Adult PT Treatment/Exercise - 10/24/14 0001    Bed Mobility   Bed Mobility Rolling Right;Rolling Left   Rolling Right 5: Set up    Rolling Right Details (indicate cue type and reason) 10x with cuing verbal an d tactile for sequencing.    Rolling Left 5: Supervision   Rolling Left Details (indicate cue type and reason) 10x with cuing verbal an d tactile for sequencing.   Knee/Hip Exercises: Supine   Bridges 10 reps;Strengthening;Left;2 sets;Both   Bridges Limitations 1 set with both and 1 set with Left   Other Supine Knee Exercises bridge up on elbow 10x    Other Supine Knee Exercises Supine to sit 2x with verbal and tactile cuing.            PT Short Term Goals - 10/12/14 1548    PT SHORT TERM GOAL #1   Title Patient will be able ot perfom sit to stand from 18" chair without UE assistance   Time 4   Period Weeks   Status New   PT SHORT TERM GOAL #2   Title Patient will be able to stand >1 minute withotu any UE assistance or assistive device   Time 4   Period Weeks   Status New   PT SHORT TERM GOAL #3   Title Patient will demonstrate increased Lt hamstring strength to 4+/5 to improve gait stability   Time 4  Period Weeks   Status New   PT SHORT TERM GOAL #4   Title patient will be bale to perform walk test withtou rest   Time 4   Period Weeks   Status New           PT Long Term Goals - 10/12/14 1549    PT LONG TERM GOAL #1   Title Patient will be able ot perfom sit to stand from 18" chair without UE assistance 5x in less than 20 seconds indicatign improving LE power   Time 8   Period Weeks   Status New   PT LONG TERM GOAL #2   Title Patient will be able to bend over and pick up 5 lb from the floor independently indicating improved function trunk stability and 4/5 MMT fot abdominal strength   Baseline 2/5   Time 8   Period Weeks   Status New   PT LONG TERM GOAL #3   Title Patient will dmeosntrate fast gait speed of 1.64m/s to indicate patient can ambualte at comminuty ambulatory status speeds when needed.    Time 8   Period Weeks   Status New           Plan - 10/24/14 1343     Clinical Impression Statement TUG and walk performed with patient dispalsing very slow gait and limitd balance. session focused on introduction of bed mobility exercises to decrease dependence on wife for help with supine to sit and rolling in bed. Also introduced heel toe raises to increase LE strength and balance.    PT Next Visit Plan  Initial focus on increasing abdominal strength utilizing bed mobility activities, and improvign sit to stand transfer visa 3D hip excursions and squats.   PT Home Exercise Plan 3D hip excursions and sit to stand; rolling in bed 10x each way and calf raises 10x.       Problem List There are no active problems to display for this patient.  Jerilee Field PT DPT [phone]  [facility] [address] [place], [place], [address] Phone: [phone]   Fax:  [phone]

## 2014-10-24 NOTE — Patient Instructions (Signed)
Ankle Plantar Flexion / Dorsiflexion, Standing   Stand while holding a stable object. Rise up on toes. Then rock back on heels.  Repeat 10 to 2 times per session. Do 2 sessions per day.  Copyright  VHI. All rights reserved.

## 2014-10-26 ENCOUNTER — Ambulatory Visit (HOSPITAL_COMMUNITY)
Admission: RE | Admit: 2014-10-26 | Discharge: 2014-10-26 | Disposition: A | Discharge status: Home / Self Care | Payer: Medicare Other | Source: Ambulatory Visit | Attending: Neurology | Admitting: Neurology

## 2014-10-26 DIAGNOSIS — M6281 Muscle weakness (generalized): Secondary | ICD-10-CM | POA: Diagnosis not present

## 2014-10-26 DIAGNOSIS — G35 Multiple sclerosis: Secondary | ICD-10-CM

## 2014-10-26 DIAGNOSIS — M25659 Stiffness of unspecified hip, not elsewhere classified: Secondary | ICD-10-CM

## 2014-10-26 DIAGNOSIS — R29898 Other symptoms and signs involving the musculoskeletal system: Secondary | ICD-10-CM

## 2014-10-26 DIAGNOSIS — R198 Other specified symptoms and signs involving the digestive system and abdomen: Secondary | ICD-10-CM

## 2014-10-26 DIAGNOSIS — Z9181 History of falling: Secondary | ICD-10-CM

## 2014-10-26 NOTE — Therapy (Signed)
[facility] [address] [place], [place], [address] Phone: [phone]   Fax:  [phone]  Physical Therapy Treatment  Patient Details  Name: Vincent Anthony MRN: [id number] Date of Birth: [birth year] Referring Provider:  Harlen Labs, MD  Encounter Date: 10/26/2014      PT End of Session - 10/26/14 1752    Visit Number 3   Number of Visits 16   Date for PT Re-Evaluation 11/11/14   Authorization Type medicare   Authorization - Visit Number 3   Authorization - Number of Visits 16   PT Start Time 1524   PT Stop Time 1615   PT Time Calculation (min) 51 min   Equipment Utilized During Treatment Gait belt   Activity Tolerance Patient tolerated treatment well;Patient limited by fatigue   Behavior During Therapy [facility] for tasks assessed/performed      Past Medical History  Diagnosis Date  . MS (multiple sclerosis)   . Headache   . Tremor     Past Surgical History  Procedure Laterality Date  . Knee surgery    . Hernia repair      There were no vitals taken for this visit.  Visit Diagnosis:  Multiple sclerosis  Hip stiffness, unspecified laterality  Weakness of left hip  Abdominal weakness  At risk for injury related to fall  Weakness of right lower extremity      Subjective Assessment - 10/26/14 1542    Symptoms Pain free, compliant with HEP daily.  Pt stated his bed mobiltiy is improving a lot, has been able to get in and out without wife assistance.   Currently in Pain? No/denies                    [facility] Adult PT Treatment/Exercise - 10/26/14 0001    Knee/Hip Exercises: Standing   Forward Lunges Both;10 reps   Forward Lunges Limitations 6 in step with therapist assistance placing Rt foot on step   Functional Squat 2 sets;10 reps   Functional Squat Limitations 3D hip excursion   Gait Training Instructed proper UE for Rt LE weakness, cueing for sequence x 226 feet   Other Standing Knee Exercises  Side stepping and retro gait 2RT inside // bars   Other Standing Knee Exercises hip wall bumps 10x from 15in away from wall                  PT Short Term Goals - 10/26/14 1800    PT SHORT TERM GOAL #1   Title Patient will be able ot perfom sit to stand from 18" chair without UE assistance   Status On-going   PT SHORT TERM GOAL #2   Title Patient will be able to stand >1 minute withotu any UE assistance or assistive device   PT SHORT TERM GOAL #3   Title Patient will demonstrate increased Lt hamstring strength to 4+/5 to improve gait stability   PT SHORT TERM GOAL #4   Title patient will be bale to perform walk test withtou rest           PT Long Term Goals - 10/26/14 1800    PT LONG TERM GOAL #1   Title Patient will be able ot perfom sit to stand from 18" chair without UE assistance 5x in less than 20 seconds indicatign improving LE power   PT LONG TERM GOAL #2   Title Patient will be able to bend over and pick up 5  lb from the floor independently indicating improved function trunk stability and 4/5 MMT fot abdominal strength   PT LONG TERM GOAL #3   Title Patient will dmeosntrate fast gait speed of 1.62m/s to indicate patient can ambualte at comminuty ambulatory status speeds when needed.                Plan - 10/26/14 1754    Clinical Impression Statement This session focus on appropraite gait mechanics with QC for proper UE and sequence and functional strengtheing following reports of increase ease with bed mobility.  THerapist facilitation for proper form and technique with standing exercises.  Began 3D hip excursion for hip mobilty and gluteal strenghtening with hands on hips for increase challenge with balance.  Lunges comples with therapist assistance putting Rt LE onto step.  Standing balance activaiton for gluteal strengtheing inside parallel bars for safety.  Noted musculature fatigue with activtiies.   PT Next Visit Plan Continue with current PT  POC.  Assess gait mechanics.  Return to bed mobiltiy activities utilizing abdominal strengthening, sit to stands, squats and lunges.  Begin shoulder wall bumps for abdominal strenghteniing.          Problem List There are no active problems to display for this patient.  Becky Sax, [place] [phone]  Juel Burrow 10/26/2014, 6:04 PM  [facility] [address] [place], [place], [address] Phone: [phone]   Fax:  [phone]

## 2014-10-30 ENCOUNTER — Ambulatory Visit (HOSPITAL_COMMUNITY)
Admission: RE | Admit: 2014-10-30 | Discharge: 2014-10-30 | Disposition: A | Discharge status: Home / Self Care | Payer: Medicare Other | Source: Ambulatory Visit | Attending: Neurology | Admitting: Neurology

## 2014-10-30 DIAGNOSIS — R29898 Other symptoms and signs involving the musculoskeletal system: Secondary | ICD-10-CM

## 2014-10-30 DIAGNOSIS — R2689 Other abnormalities of gait and mobility: Secondary | ICD-10-CM | POA: Insufficient documentation

## 2014-10-30 DIAGNOSIS — Z9181 History of falling: Secondary | ICD-10-CM | POA: Diagnosis not present

## 2014-10-30 DIAGNOSIS — G35 Multiple sclerosis: Secondary | ICD-10-CM | POA: Insufficient documentation

## 2014-10-30 DIAGNOSIS — R198 Other specified symptoms and signs involving the digestive system and abdomen: Secondary | ICD-10-CM

## 2014-10-30 DIAGNOSIS — M25659 Stiffness of unspecified hip, not elsewhere classified: Secondary | ICD-10-CM | POA: Diagnosis not present

## 2014-10-30 DIAGNOSIS — M6281 Muscle weakness (generalized): Secondary | ICD-10-CM | POA: Insufficient documentation

## 2014-10-30 NOTE — Patient Instructions (Signed)
Bridge   Lie back, legs bent. Inhale, pressing hips up. Keeping ribs in, lengthen lower back. Exhale, rolling down along spine from top. Repeat 10 times. Do 3 sets per day.  Copyright  VHI. All rights reserved.

## 2014-10-30 NOTE — Therapy (Signed)
[facility] [address] [place], [place], [address] Phone: [phone]   Fax:  [phone]  Physical Therapy Treatment  Patient Details  Name: Vincent Anthony MRN: [id number] Date of Birth: 24-Nov-1970 Referring Provider:  Harlen Labs, MD  Encounter Date: 10/30/2014      PT End of Session - 10/30/14 1737    Visit Number 4   Number of Visits 16   Date for PT Re-Evaluation 11/11/14   Authorization Type medicare   Authorization - Visit Number 4   Authorization - Number of Visits 16   PT Start Time 1433   PT Stop Time 1515   PT Time Calculation (min) 42 min   Activity Tolerance Patient tolerated treatment well;Patient limited by fatigue   Behavior During Therapy [facility] for tasks assessed/performed      Past Medical History  Diagnosis Date  . MS (multiple sclerosis)   . Headache   . Tremor     Past Surgical History  Procedure Laterality Date  . Knee surgery    . Hernia repair      There were no vitals taken for this visit.  Visit Diagnosis:  Multiple sclerosis  Hip stiffness, unspecified laterality  Weakness of left hip  Abdominal weakness  At risk for injury related to fall  Weakness of right lower extremity      Subjective Assessment - 10/30/14 1706    Symptoms No pain, patient states he's noted gettign strnger and so have his workout buddies             [facility] Adult PT Treatment/Exercise - 10/30/14 0001    Knee/Hip Exercises: Standing   Forward Lunges Both;10 reps   Other Standing Knee Exercises 3D hip excursions 10x   Other Standing Knee Exercises Split stance 3D hip excursions 10x Lunges 10x forward   Knee/Hip Exercises: Supine   Bridges Left;Right;Strengthening;3 sets   Bridges Limitations 1 set with both and 1 set with Left and 1 set with right    Patient required multiple rest breaks restulting in limited performance of TherEx       PT Short Term Goals - 10/26/14 1800    PT SHORT TERM GOAL  #1   Title Patient will be able ot perfom sit to stand from 18" chair without UE assistance   Status On-going   PT SHORT TERM GOAL #2   Title Patient will be able to stand >1 minute withotu any UE assistance or assistive device   PT SHORT TERM GOAL #3   Title Patient will demonstrate increased Lt hamstring strength to 4+/5 to improve gait stability   PT SHORT TERM GOAL #4   Title patient will be bale to perform walk test withtou rest           PT Long Term Goals - 10/26/14 1800    PT LONG TERM GOAL #1   Title Patient will be able ot perfom sit to stand from 18" chair without UE assistance 5x in less than 20 seconds indicatign improving LE power   PT LONG TERM GOAL #2   Title Patient will be able to bend over and pick up 5 lb from the floor independently indicating improved function trunk stability and 4/5 MMT fot abdominal strength   PT LONG TERM GOAL #3   Title Patient will dmeosntrate fast gait speed of 1.72m/s to indicate patient can ambualte at comminuty ambulatory status speeds when needed.  Plan - 10/30/14 1738    Clinical Impression Statement Session focused on LE strengtheing and mobili9ty exercises to improve ability t walk and perform bed mobility. Patientdisplasyed improved ability to weight shift during exercises folllowing but is quick to fatigue resulting in increased therapist assistance with lunging and squats to prevent fall.     PT Home Exercise Plan 3D hip excursions and sit to stand; rolling in bed 10x each way and calf raises 10x. Split stance 3D hip excursions, bridges       Problem List There are no active problems to display for this patient.  Jerilee Field PT DPT [phone]  [name] [address] [place], [place], [address] Phone: [phone]   Fax:  [phone]

## 2014-10-31 ENCOUNTER — Ambulatory Visit (HOSPITAL_COMMUNITY)
Admission: RE | Admit: 2014-10-31 | Discharge: 2014-10-31 | Disposition: A | Discharge status: Home / Self Care | Payer: Medicare Other | Source: Ambulatory Visit | Attending: Neurology | Admitting: Neurology

## 2014-10-31 DIAGNOSIS — R198 Other specified symptoms and signs involving the digestive system and abdomen: Secondary | ICD-10-CM

## 2014-10-31 DIAGNOSIS — M6281 Muscle weakness (generalized): Secondary | ICD-10-CM | POA: Diagnosis not present

## 2014-10-31 DIAGNOSIS — R29898 Other symptoms and signs involving the musculoskeletal system: Secondary | ICD-10-CM

## 2014-10-31 DIAGNOSIS — Z9181 History of falling: Secondary | ICD-10-CM

## 2014-10-31 DIAGNOSIS — G35 Multiple sclerosis: Secondary | ICD-10-CM

## 2014-10-31 DIAGNOSIS — M25659 Stiffness of unspecified hip, not elsewhere classified: Secondary | ICD-10-CM

## 2014-10-31 NOTE — Therapy (Signed)
[facility] [address] [place], [place], [address] Phone: [phone]   Fax:  [phone]  Physical Therapy Treatment  Patient Details  Name: Vincent Anthony MRN: [id number] Date of Birth: Nov 14, 1970 Referring Provider:  Harlen Labs, MD  Encounter Date: 10/31/2014      PT End of Session - 10/31/14 1206    Visit Number 5   Number of Visits 16   Date for PT Re-Evaluation 11/11/14   Authorization Type medicare   Authorization - Visit Number 5   Authorization - Number of Visits 16   PT Start Time 1105   PT Stop Time 1156   PT Time Calculation (min) 51 min   Activity Tolerance Patient tolerated treatment well;Patient limited by fatigue   Behavior During Therapy [facility] for tasks assessed/performed      Past Medical History  Diagnosis Date  . MS (multiple sclerosis)   . Headache   . Tremor     Past Surgical History  Procedure Laterality Date  . Knee surgery    . Hernia repair      There were no vitals taken for this visit.  Visit Diagnosis:  Multiple sclerosis  Hip stiffness, unspecified laterality  Weakness of left hip  Abdominal weakness  At risk for injury related to fall  Weakness of right lower extremity      Subjective Assessment - 10/31/14 1158    Symptoms Pain R wrist, pt states that he had it when he woke up this morning and that it was also present yesterday but that it is been happening for a couple of months now   Pertinent History Patient arrives with promary complaint of LE weakness and trunk instability secondary to MS placing patient at ioncreased risk for falls. noted muscle spasms jin rt knee brac3 for which he has an knee brace and AFO to prevent Rt knee from hyper extending. Notes the cold stiffnes up his leg more.                     [facility] Adult PT Treatment/Exercise - 10/31/14 0001    Bed Mobility   Bed Mobility Rolling Right;Rolling Left  manual resistance   Rolling Left  Details (indicate cue type and reason) Supine to sit with Mod cues and Min-Mod assist; patient continues to show difficulty with sequencing during task   Knee/Hip Exercises: Standing   Heel Raises 1 set;15 reps   Forward Lunges Both;1 set;10 reps   Functional Squat 1 set;10 reps   Other Standing Knee Exercises 3D hip excursions 10x   Other Standing Knee Exercises Standing hip extension with cues for form 1x10 B   Knee/Hip Exercises: Supine   Bridges 1 set;10 reps   Bridges Limitations manual resistance   Other Supine Knee Exercises Pelvic walks at edge of mat table; Mod(A) for proper form, good posture, Mod levels of facilitation during task   Other Supine Knee Exercises Sit to stand with SBA 1x8                  PT Short Term Goals - 10/26/14 1800    PT SHORT TERM GOAL #1   Title Patient will be able ot perfom sit to stand from 18" chair without UE assistance   Status On-going   PT SHORT TERM GOAL #2   Title Patient will be able to stand >1 minute withotu any UE assistance or assistive device   PT SHORT TERM GOAL #3   Title  Patient will demonstrate increased Lt hamstring strength to 4+/5 to improve gait stability   PT SHORT TERM GOAL #4   Title patient will be bale to perform walk test withtou rest           PT Long Term Goals - 10/26/14 1800    PT LONG TERM GOAL #1   Title Patient will be able ot perfom sit to stand from 18" chair without UE assistance 5x in less than 20 seconds indicatign improving LE power   PT LONG TERM GOAL #2   Title Patient will be able to bend over and pick up 5 lb from the floor independently indicating improved function trunk stability and 4/5 MMT fot abdominal strength   PT LONG TERM GOAL #3   Title Patient will dmeosntrate fast gait speed of 1.32m/s to indicate patient can ambualte at comminuty ambulatory status speeds when needed.                Plan - 10/31/14 1206    Clinical Impression Statement Focus on core strength  and mobility on mat table; patient had significant difficulty with dynamic seated balance in long sitting as well as pelvic walks, required Mod(A)/Mod facilitation to complete. Education provided regarding correct sequencing for supine to sit however patient continues to have difficulty with sequencing throughout. Patient did express potential interest in bed rails at home, also possibly in [place]t crutches and states that he will speak to his neurologist about possibly obtaining prescription.   PT Next Visit Plan Core and pelvic stabilizer strength, functional strengthening with sit to stand/lunges/hip extensions/bridges, shoulder wall bumps, trial step up on 2 inch step   PT Home Exercise Plan 3D hip excursions and sit to stand; rolling in bed 10x each way and calf raises 10x. Split stance 3D hip excursions, bridges; added standing hip extension at kitchen counter        Problem List There are no active problems to display for this patient.   Nedra Hai PT, DPT [phone]  [facility] [address] [place], [place], [address] Phone: [phone]   Fax:  [phone]

## 2014-10-31 NOTE — Patient Instructions (Signed)
Balance, Proprioception: Hip Extension With Tubing   While standing at kitchen counter, maintain upright posture and swing leg back. Return. Be careful of not going to far- do not perform exercise by bending at your hips. Repeat __10__ times each side. Do _1-2___ sessions per day.  http://cc.exer.us/19   Copyright  VHI. All rights reserved.

## 2014-11-02 ENCOUNTER — Ambulatory Visit (HOSPITAL_COMMUNITY): Payer: Medicare Other | Admitting: Physical Therapy

## 2014-11-06 ENCOUNTER — Ambulatory Visit (HOSPITAL_COMMUNITY): Payer: Medicare Other

## 2014-11-09 ENCOUNTER — Ambulatory Visit (HOSPITAL_COMMUNITY): Payer: Medicare Other | Admitting: Physical Therapy

## 2014-11-13 ENCOUNTER — Ambulatory Visit (HOSPITAL_COMMUNITY)
Admission: RE | Admit: 2014-11-13 | Discharge: 2014-11-13 | Disposition: A | Discharge status: Home / Self Care | Payer: Medicare Other | Source: Ambulatory Visit | Attending: Neurology | Admitting: Neurology

## 2014-11-13 DIAGNOSIS — G35 Multiple sclerosis: Secondary | ICD-10-CM

## 2014-11-13 DIAGNOSIS — R29898 Other symptoms and signs involving the musculoskeletal system: Secondary | ICD-10-CM

## 2014-11-13 DIAGNOSIS — M25659 Stiffness of unspecified hip, not elsewhere classified: Secondary | ICD-10-CM

## 2014-11-13 DIAGNOSIS — M6281 Muscle weakness (generalized): Secondary | ICD-10-CM | POA: Diagnosis not present

## 2014-11-13 DIAGNOSIS — R198 Other specified symptoms and signs involving the digestive system and abdomen: Secondary | ICD-10-CM

## 2014-11-13 DIAGNOSIS — Z9181 History of falling: Secondary | ICD-10-CM

## 2014-11-13 NOTE — Addendum Note (Signed)
Encounter addended by: Doyne Keel, PT on: 11/13/2014  3:27 PM<BR>     Documentation filed: Patient Instructions Section, Flowsheet VN, BPA Follow-up Actions

## 2014-11-13 NOTE — Therapy (Signed)
[facility] [address] [place], [place], [address] Phone: [phone]   Fax:  [phone]  Physical Therapy Treatment  Patient Details  Name: Vincent Anthony MRN: [id number] Date of Birth: [birth year] Referring Provider:  Harlen Labs, MD  Encounter Date: 11/13/2014      PT End of Session - 11/13/14 1511    Visit Number 6   Number of Visits 16   Date for PT Re-Evaluation 11/11/14   Authorization Type medicare   Authorization - Visit Number 6   Authorization - Number of Visits 16   PT Start Time 1430   PT Stop Time 1515   PT Time Calculation (min) 45 min   Equipment Utilized During Treatment Gait belt   Activity Tolerance Patient tolerated treatment well;Patient limited by fatigue   Behavior During Therapy [facility] for tasks assessed/performed      Past Medical History  Diagnosis Date  . MS (multiple sclerosis)   . Headache   . Tremor     Past Surgical History  Procedure Laterality Date  . Knee surgery    . Hernia repair      There were no vitals taken for this visit.  Visit Diagnosis:  Multiple sclerosis  Hip stiffness, unspecified laterality  Weakness of left hip  Abdominal weakness  At risk for injury related to fall  Weakness of right lower extremity      Subjective Assessment - 11/13/14 1517    Symptoms Patient notes being sickk over the last week resulting in him missing his recent appoitnemnts , stiffening up and feeling weak. notes he was too weak to perform HE though he is going to do it the rest of this week up to his next appointment.           [facility] Adult PT Treatment/Exercise - 11/13/14 0001    Knee/Hip Exercises: Standing   Heel Raises 1 set;10 reps   Forward Lunges Both;10 reps;2 sets   Forward Lunges Limitations to 6"   Side Lunges 10 reps;Both   Side Lunges Limitations to 6"    Functional Squat Limitations 3D hip excursion   Gait Training Instructed proper cane use for Rt LE weakness,  cueing for sequence 4x 21ft.  feet   Other Standing Knee Exercises Standing hip extension and hip abducion with cues form 1x10 B                  PT Short Term Goals - 10/26/14 1800    PT SHORT TERM GOAL #1   Title Patient will be able ot perfom sit to stand from 18" chair without UE assistance   Status On-going   PT SHORT TERM GOAL #2   Title Patient will be able to stand >1 minute withotu any UE assistance or assistive device   PT SHORT TERM GOAL #3   Title Patient will demonstrate increased Lt hamstring strength to 4+/5 to improve gait stability   PT SHORT TERM GOAL #4   Title patient will be bale to perform walk test withtou rest           PT Long Term Goals - 10/26/14 1800    PT LONG TERM GOAL #1   Title Patient will be able ot perfom sit to stand from 18" chair without UE assistance 5x in less than 20 seconds indicatign improving LE power   PT LONG TERM GOAL #2   Title Patient will be able to bend over and pick up 5 lb  from the floor independently indicating improved function trunk stability and 4/5 MMT fot abdominal strength   PT LONG TERM GOAL #3   Title Patient will dmeosntrate fast gait speed of 1.87m/s to indicate patient can ambualte at comminuty ambulatory status speeds when needed.                Plan - 11/13/14 1518    Clinical Impression Statement Session focused on increasing glut med max strength. t improve gait stability and balance to decrease fear of falling. Patient demosntrated difficulty wit Rt hip marching secondary to right hip weakness.   PT Next Visit Plan Core and pelvic stabilizer strength, functional strengthening with sit to stand/lunges/hip extensions/bridges, shoulder wall bumps, trial step up on 2 inch step, continue gait training with cane in Lt UE. perform reassessment. and update gcode   Consulted and Agree with Plan of Care Patient        Problem List There are no active problems to display for this  patient.   Doyne Keel 11/13/2014, 3:19 PM  [facility] [address] [place], [place], [address] Phone: [phone]   Fax:  [phone]

## 2014-11-13 NOTE — Patient Instructions (Signed)
ABDUCTION: Standing (Active)   Stand, feet flat. Lift right leg out to side. U Complete 2 sets of 10 repetitions. Perform 2  sessions per day.  http://gtsc.exer.us/110   Copyright  VHI. All rights reserved.

## 2014-11-16 ENCOUNTER — Ambulatory Visit (HOSPITAL_COMMUNITY): Payer: Medicare Other | Admitting: Physical Therapy

## 2014-11-16 DIAGNOSIS — G35 Multiple sclerosis: Secondary | ICD-10-CM

## 2014-11-16 DIAGNOSIS — R29898 Other symptoms and signs involving the musculoskeletal system: Secondary | ICD-10-CM

## 2014-11-16 DIAGNOSIS — R198 Other specified symptoms and signs involving the digestive system and abdomen: Secondary | ICD-10-CM

## 2014-11-16 DIAGNOSIS — M25659 Stiffness of unspecified hip, not elsewhere classified: Secondary | ICD-10-CM

## 2014-11-16 DIAGNOSIS — Z9181 History of falling: Secondary | ICD-10-CM

## 2014-11-16 DIAGNOSIS — M6281 Muscle weakness (generalized): Secondary | ICD-10-CM | POA: Diagnosis not present

## 2014-11-16 NOTE — Therapy (Signed)
[facility] [address] [place], [place], [address] Phone: [phone]   Fax:  [phone]  Physical Therapy Treatment/ Reassement  Patient Details  Name: Vincent Anthony MRN: [id number] Date of Birth: 1971-03-15 Referring Provider:  Harlen Labs, MD  Encounter Date: 11/16/2014      PT End of Session - 11/16/14 1529    Visit Number 7   Number of Visits 16   Date for PT Re-Evaluation 11/11/14   Authorization Type medicare   Authorization - Visit Number 7   Authorization - Number of Visits 16   PT Start Time 1515   PT Stop Time 1600   PT Time Calculation (min) 45 min   Activity Tolerance Patient tolerated treatment well;Patient limited by fatigue   Behavior During Therapy [facility] for tasks assessed/performed      Past Medical History  Diagnosis Date  . MS (multiple sclerosis)   . Headache   . Tremor     Past Surgical History  Procedure Laterality Date  . Knee surgery    . Hernia repair      There were no vitals taken for this visit.  Visit Diagnosis:  Multiple sclerosis  Hip stiffness, unspecified laterality  Weakness of left hip  Abdominal weakness  At risk for injury related to fall  Weakness of right lower extremity        [facility] PT Assessment - 11/16/14 0001    Assessment   Medical Diagnosis decreased balance, difficulty walking and LE weakness secondary to MS.    Onset Date 02/15/00   Next MD Visit Vincent Anthony, Vincent Anthony 11/02/14   Prior Therapy Yes, 2014   Observation/Other Assessments   Focus on Therapeutic Outcomes (FOTO)  51%limited   Strength   Left Hip Flexion 4-/5   Left Hip Extension 2+/5   Left Hip ABduction 2+/5   Left Knee Flexion 4/5   Left Knee Extension 5/5   Left Ankle Dorsiflexion 5/5   Timed Up and Go Test   Normal TUG (seconds) 129   TUG Comments patint fatigued priior to measurement.            [facility] Adult PT Treatment/Exercise - 11/16/14 0001    Knee/Hip Exercises:  Standing   Heel Raises 2 sets;10 reps   Forward Lunges Both;10 reps;2 sets   Forward Lunges Limitations to 6"   Side Lunges 10 reps;Both   Side Lunges Limitations to 6"    Forward Step Up Step Height: 4";10 reps   Functional Squat Limitations 3D split stance hip excursion 10x   Gait Training Instructed proper cane use for Rt LE weakness, cueing for sequence 4x 34ft.  257ft   Other Standing Knee Exercises Standing hip extension and hip abducion with cues form 1x10 B   Knee/Hip Exercises: Supine   Bridges 1 set;10 reps            PT Short Term Goals - 11/16/14 1649    PT SHORT TERM GOAL #1   Title Patient will be able ot perfom sit to stand from 18" chair without UE assistance   Status On-going   PT SHORT TERM GOAL #2   Title Patient will be able to stand >1 minute withotu any UE assistance or assistive device   Status Achieved   PT SHORT TERM GOAL #3   Title Patient will demonstrate increased Lt hamstring strength to 4+/5 to improve gait stability   Status Achieved   PT SHORT TERM GOAL #4   Title  patient will be bale to perform walk test withtou rest   Status On-going           PT Long Term Goals - 2014/12/13 1649    PT LONG TERM GOAL #1   Title Patient will be able ot perfom sit to stand from 18" chair without UE assistance 5x in less than 20 seconds indicatign improving LE power   Status On-going   PT LONG TERM GOAL #2   Title Patient will be able to bend over and pick up 5 lb from the floor independently indicating improved function trunk stability and 4/5 MMT fot abdominal strength   Status On-going   PT LONG TERM GOAL #3   Title Patient will dmeosntrate fast gait speed of 1.54m/s to indicate patient can ambualte at comminuty ambulatory status speeds when needed.    Status On-going          Plan - December 13, 2014 1647    Clinical Impression Statement Reassessment performed. Patient dmeosntrates improving Lt LE strength resulting in decreased dependence on UE  support with functional LE activities. Patient continues to display difficulty with functional outcomes measured secondary to LE weakness. Continue skilled PT 2x a week for 4 more weeks.    PT Next Visit Plan Core and pelvic stabilizer strength, functional strengthening with sit to stand/lunges/hip extensions/bridges, shoulder wall bumps, trial step up on 2 inch step, continue gait training with cane in Lt UE.           G-Codes - Dec 13, 2014 1537    Functional Assessment Tool Used FOTO 51% limited   Functional Limitation Mobility: Walking and moving around   Mobility: Walking and Moving Around Current Status (C1448) At least 40 percent but less than 60 percent impaired, limited or restricted   Mobility: Walking and Moving Around Goal Status [id number]) At least 20 percent but less than 40 percent impaired, limited or restricted      Problem List There are no active problems to display for this patient.  Jerilee Field PT DPT [phone]  [facility] [address] [place], [place], [address] Phone: [phone]   Fax:  [phone]

## 2014-11-16 NOTE — Patient Instructions (Signed)
ABDUCTION: Standing (Active)   Stand, feet flat. Lift right leg out to side.  Complete 2 sets of _10__ repetitions. Perform 1 sessions per day.  http://gtsc.exer.us/110   Copyright  VHI. All rights reserved.

## 2014-11-20 ENCOUNTER — Ambulatory Visit (HOSPITAL_COMMUNITY): Payer: Medicare Other

## 2014-11-23 ENCOUNTER — Ambulatory Visit (HOSPITAL_COMMUNITY): Payer: Medicare Other

## 2014-11-23 ENCOUNTER — Telehealth (HOSPITAL_COMMUNITY): Payer: Self-pay

## 2014-11-23 NOTE — Telephone Encounter (Signed)
His aunt died and he can't come in today

## 2014-11-27 ENCOUNTER — Ambulatory Visit (HOSPITAL_COMMUNITY): Payer: Medicare Other | Attending: Neurology

## 2014-11-27 DIAGNOSIS — R2689 Other abnormalities of gait and mobility: Secondary | ICD-10-CM | POA: Diagnosis not present

## 2014-11-27 DIAGNOSIS — M25659 Stiffness of unspecified hip, not elsewhere classified: Secondary | ICD-10-CM

## 2014-11-27 DIAGNOSIS — R198 Other specified symptoms and signs involving the digestive system and abdomen: Secondary | ICD-10-CM

## 2014-11-27 DIAGNOSIS — M6281 Muscle weakness (generalized): Secondary | ICD-10-CM | POA: Diagnosis not present

## 2014-11-27 DIAGNOSIS — Z9181 History of falling: Secondary | ICD-10-CM | POA: Insufficient documentation

## 2014-11-27 DIAGNOSIS — G35 Multiple sclerosis: Secondary | ICD-10-CM | POA: Insufficient documentation

## 2014-11-27 DIAGNOSIS — R29898 Other symptoms and signs involving the musculoskeletal system: Secondary | ICD-10-CM

## 2014-11-27 NOTE — Therapy (Signed)
[facility] [address] [place], [place], [address] Phone: [phone]   Fax:  [phone]  Physical Therapy Treatment  Patient Details  Name: Vincent Anthony MRN: [id number] Date of Birth: 25-Feb-1971 Referring Provider:  Harlen Labs, MD  Encounter Date: 11/27/2014      PT End of Session - 11/27/14 1453    Visit Number 8   Number of Visits 16   Date for PT Re-Evaluation 12/14/14   Authorization Type medicare   Authorization Time Period Gcode complete 7th visit   Authorization - Visit Number 8   Authorization - Number of Visits 16   PT Start Time 1445   PT Stop Time 1520   PT Time Calculation (min) 35 min   Equipment Utilized During Treatment Gait belt   Activity Tolerance Patient tolerated treatment well;Patient limited by fatigue   Behavior During Therapy [facility] for tasks assessed/performed      Past Medical History  Diagnosis Date  . MS (multiple sclerosis)   . Headache   . Tremor     Past Surgical History  Procedure Laterality Date  . Knee surgery    . Hernia repair      There were no vitals taken for this visit.  Visit Diagnosis:  Multiple sclerosis  Hip stiffness, unspecified laterality  Weakness of left hip  Abdominal weakness  At risk for injury related to fall  Weakness of right lower extremity      Subjective Assessment - 11/27/14 1449    Symptoms Pt entered dept ambulating with RW, reported he had a fall last Thursday, no reports of pain today.  Pt late for apt today.     Currently in Pain? No/denies          [facility] PT Assessment - 11/27/14 0001    Assessment   Medical Diagnosis decreased balance, difficulty walking and LE weakness secondary to MS.    Onset Date 02/15/00   Next MD Visit Harlen Labs, Leane Para    Prior Therapy Yes, 2014                  [facility] Adult PT Treatment/Exercise - 11/27/14 0001    Exercises   Exercises Knee/Hip   Knee/Hip Exercises: Stretches   Gastroc Stretch 3 reps;20 seconds   Gastroc Stretch Limitations therapist facilitaton   Knee/Hip Exercises: Standing   Forward Lunges Both;2 sets;15 reps   Forward Lunges Limitations to 6"   Side Lunges Both;15 reps   Side Lunges Limitations to 6" with therapist facilitaton   Functional Squat 15 reps   Functional Squat Limitations split stance with Rt behind   Gait Training Gait training with RW, cueing to keep LE inside walker, therapist facilitation with Rt LE knee flexion and heel to toe                  PT Short Term Goals - 11/27/14 1455    PT SHORT TERM GOAL #1   Title Patient will be able ot perfom sit to stand from 18" chair without UE assistance   Status On-going   PT SHORT TERM GOAL #2   Title Patient will be able to stand >1 minute withotu any UE assistance or assistive device   Status Achieved   PT SHORT TERM GOAL #3   Title Patient will demonstrate increased Lt hamstring strength to 4+/5 to improve gait stability   Status Achieved   PT SHORT TERM GOAL #4   Title patient will be bale  to perform walk test withtou rest   Status On-going           PT Long Term Goals - 11/27/14 1456    PT LONG TERM GOAL #1   Title Patient will be able ot perfom sit to stand from 18" chair without UE assistance 5x in less than 20 seconds indicatign improving LE power   Status On-going   PT LONG TERM GOAL #2   Title Patient will be able to bend over and pick up 5 lb from the floor independently indicating improved function trunk stability and 4/5 MMT fot abdominal strength   Status On-going   PT LONG TERM GOAL #3   Title Patient will dmeosntrate fast gait speed of 1.35m/s to indicate patient can ambualte at comminuty ambulatory status speeds when needed.                Plan - 11/27/14 1717    Clinical Impression Statement Pt ambulating with RW this session following reports of fall last Thursday.  Gait training with therapist facilitaiton for Rt LE and cueing  to keep LE inside walker for safety.  Continued gluteal strenghteing exercises with lunges,with therapist facilitation to improve Rt LE mechanics with exercise.  Pt given tennis balls for RW for increase ease with gait.  Unable to complete full POC due to pt. late for apt.     PT Next Visit Plan Core and pelvic stabilizer strength, functional strengthening with sit to stand/lunges/hip extensions/bridges, shoulder wall bumps, trial step up on 2 inch step, continue gait training with cane in Lt UE.         Problem List There are no active problems to display for this patient.  Becky Sax, [place] [phone]  Juel Burrow 11/27/2014, 5:22 PM  [facility] [address] [place], [place], [address] Phone: [phone]   Fax:  [phone]

## 2014-11-30 ENCOUNTER — Ambulatory Visit (HOSPITAL_COMMUNITY): Payer: Medicare Other | Admitting: Physical Therapy

## 2014-11-30 ENCOUNTER — Emergency Department (HOSPITAL_COMMUNITY): Payer: No Typology Code available for payment source

## 2014-11-30 ENCOUNTER — Emergency Department (HOSPITAL_COMMUNITY)
Admission: EM | Admit: 2014-11-30 | Discharge: 2014-11-30 | Disposition: A | Discharge status: Home / Self Care | Payer: No Typology Code available for payment source | Attending: Emergency Medicine | Admitting: Emergency Medicine

## 2014-11-30 ENCOUNTER — Encounter (HOSPITAL_COMMUNITY): Payer: Self-pay | Admitting: Emergency Medicine

## 2014-11-30 DIAGNOSIS — S4992XA Unspecified injury of left shoulder and upper arm, initial encounter: Secondary | ICD-10-CM | POA: Insufficient documentation

## 2014-11-30 DIAGNOSIS — S199XXA Unspecified injury of neck, initial encounter: Secondary | ICD-10-CM | POA: Diagnosis present

## 2014-11-30 DIAGNOSIS — G35 Multiple sclerosis: Secondary | ICD-10-CM | POA: Diagnosis not present

## 2014-11-30 DIAGNOSIS — Y9389 Activity, other specified: Secondary | ICD-10-CM | POA: Diagnosis not present

## 2014-11-30 DIAGNOSIS — S0990XA Unspecified injury of head, initial encounter: Secondary | ICD-10-CM | POA: Insufficient documentation

## 2014-11-30 DIAGNOSIS — Z79899 Other long term (current) drug therapy: Secondary | ICD-10-CM | POA: Diagnosis not present

## 2014-11-30 DIAGNOSIS — Y92410 Unspecified street and highway as the place of occurrence of the external cause: Secondary | ICD-10-CM | POA: Insufficient documentation

## 2014-11-30 DIAGNOSIS — Y998 Other external cause status: Secondary | ICD-10-CM | POA: Insufficient documentation

## 2014-11-30 DIAGNOSIS — S161XXA Strain of muscle, fascia and tendon at neck level, initial encounter: Secondary | ICD-10-CM | POA: Diagnosis not present
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
## 2014-11-30 NOTE — Discharge Instructions (Signed)
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
## 2014-11-30 NOTE — ED Notes (Signed)
Patient given discharge instruction, verbalized understand. Patient ambulatory out of the department.  
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
## 2014-12-04 ENCOUNTER — Ambulatory Visit (HOSPITAL_COMMUNITY): Payer: Medicare Other | Admitting: Physical Therapy

## 2014-12-07 ENCOUNTER — Ambulatory Visit (HOSPITAL_COMMUNITY): Payer: Medicare Other | Admitting: Physical Therapy

## 2014-12-07 ENCOUNTER — Telehealth (HOSPITAL_COMMUNITY): Payer: Self-pay | Admitting: Physical Therapy

## 2014-12-07 NOTE — Telephone Encounter (Signed)
He is still not feeling well and will not come in today

## 2014-12-18 ENCOUNTER — Ambulatory Visit (HOSPITAL_COMMUNITY): Payer: Medicare Other | Admitting: Physical Therapy

## 2015-01-03 DIAGNOSIS — N209 Urinary calculus, unspecified: Secondary | ICD-10-CM | POA: Insufficient documentation

## 2015-01-16 ENCOUNTER — Encounter (HOSPITAL_COMMUNITY): Payer: Self-pay | Admitting: Physical Therapy

## 2015-01-16 NOTE — Therapy (Signed)
[facility] [place], [place], [address] Phone: [phone]   Fax:  [phone]  Patient Details  Name: Vincent Anthony MRN: [id number] Date of Birth: [birth year] Referring Provider:  No ref. provider found  Encounter Date: 01/16/2015  PHYSICAL THERAPY DISCHARGE SUMMARY  Visits from Start of Care: 8  Current functional level related to goals / functional outcomes: PT SHORT TERM GOAL #1    Title Patient will be able ot perfom sit to stand from 18" chair without UE assistance   Status Not Met   PT SHORT TERM GOAL #2   Title Patient will be able to stand >1 minute withotu any UE assistance or assistive device   Status Achieved   PT SHORT TERM GOAL #3   Title Patient will demonstrate increased Lt hamstring strength to 4+/5 to improve gait stability   Status Achieved   PT SHORT TERM GOAL #4   Title patient will be bale to perform 17minute walk test withtou rest   Status Not Met           PT Long Term Goals - 11/27/14 1456    PT LONG TERM GOAL #1   Title Patient will be able ot perfom sit to stand from 18" chair without UE assistance 5x in less than 20 seconds indicatign improving LE power   Status Not Met   PT LONG TERM GOAL #2   Title Patient will be able to bend over and pick up 5 lb from the floor independently indicating improved function trunk stability and 4/5 MMT fot abdominal strength   Status Not Met   PT LONG TERM GOAL #3   Title Patient will dmeosntrate fast gait speed of 1.37m/s to indicate patient can ambualte at comminuty ambulatory status speeds when needed. Not Met        Plan: Patient agrees to discharge.  Patient goals were not met. Patient is being discharged due to not returning since the last visit.  ?????       Devona Konig PT DPT [facility] [address] [place], [place],  [address] Phone: [phone]   Fax:  [phone]

## 2015-05-23 DIAGNOSIS — M792 Neuralgia and neuritis, unspecified: Secondary | ICD-10-CM | POA: Insufficient documentation

## 2015-05-23 DIAGNOSIS — M25561 Pain in right knee: Secondary | ICD-10-CM | POA: Insufficient documentation

## 2015-10-08 ENCOUNTER — Telehealth (HOSPITAL_COMMUNITY): Payer: Self-pay | Admitting: Physical Therapy
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
## 2015-10-09 ENCOUNTER — Ambulatory Visit (HOSPITAL_COMMUNITY): Payer: Medicare Other | Admitting: Physical Therapy

## 2015-10-23 ENCOUNTER — Ambulatory Visit (HOSPITAL_COMMUNITY): Payer: Medicare Other | Attending: Neurology | Admitting: Physical Therapy

## 2015-10-23 ENCOUNTER — Encounter (HOSPITAL_COMMUNITY): Payer: Self-pay | Admitting: Physical Therapy

## 2015-10-23 DIAGNOSIS — Z9181 History of falling: Secondary | ICD-10-CM

## 2015-10-23 DIAGNOSIS — R198 Other specified symptoms and signs involving the digestive system and abdomen: Secondary | ICD-10-CM | POA: Diagnosis present

## 2015-10-23 DIAGNOSIS — R269 Unspecified abnormalities of gait and mobility: Secondary | ICD-10-CM | POA: Diagnosis present

## 2015-10-23 DIAGNOSIS — G35 Multiple sclerosis: Secondary | ICD-10-CM | POA: Diagnosis not present

## 2015-10-23 DIAGNOSIS — M6281 Muscle weakness (generalized): Secondary | ICD-10-CM

## 2015-10-23 DIAGNOSIS — R293 Abnormal posture: Secondary | ICD-10-CM | POA: Diagnosis present

## 2015-10-23 DIAGNOSIS — Z9189 Other specified personal risk factors, not elsewhere classified: Secondary | ICD-10-CM | POA: Insufficient documentation

## 2015-10-23 DIAGNOSIS — R278 Other lack of coordination: Secondary | ICD-10-CM | POA: Diagnosis present

## 2015-10-23 NOTE — Patient Instructions (Signed)
Bridging    Slowly raise buttocks from floor, keeping stomach tight. Repeat __10__ times per set. Do __1__ sets per session. Do __2-3__ sessions per day.  http://orth.exer.us/1096   Functional Quadriceps: Sit to Stand    Sit on edge of chair, feet flat on floor. Stand upright, extending knees fully. Make sure you have: 1) feet bent back under you 2) you are pushing up from the surface you are sitting on instead of pulling on the walker 3) you are leaning forward, nose over toes.   Repeat __5__ times per set. Do _1___ sets per session. Do _2-3___ sessions per day.  http://orth.exer.us/734    CORE ACTIVATION  While sitting on your couch, or another comfortable chair, with good posture and your hands in your lap: 1) lean as far back as you can without losing your balance backwards, then use your core to pull yourself forwards 2) lean to the left and right as far as you can without losing your balance either way, using your core to pull yourself forward  Repeat 5-10 times each direction, 2-3 times per day.   Copyright  VHI. All rights reserved.    Copyright  VHI. All rights reserved.

## 2015-10-23 NOTE — Therapy (Signed)
[facility] [address] [place], [place], [address] Phone: [phone]   Fax:  [phone]  Physical Therapy Evaluation  Patient Details  Name: Vincent Anthony MRN: [id number] Date of Birth: 1971/01/15 Referring Provider: Dr. Harlen Labs   Encounter Date: 10/23/2015      PT End of Session - 10/23/15 1542    Visit Number 1   Number of Visits 20   Date for PT Re-Evaluation 11/20/15   Authorization Type Medicare    Authorization Time Period 10/23/15 to 12/21/15   Authorization - Visit Number 1   Authorization - Number of Visits 10   PT Start Time 1431   PT Stop Time 1521   PT Time Calculation (min) 50 min   Equipment Utilized During Treatment Gait belt   Activity Tolerance Patient tolerated treatment well   Behavior During Therapy [facility] for tasks assessed/performed      Past Medical History  Diagnosis Date  . MS (multiple sclerosis) (HCC)   . Headache   . Tremor     Past Surgical History  Procedure Laterality Date  . Knee surgery    . Hernia repair    . Testicle removal      There were no vitals filed for this visit.  Visit Diagnosis:  Multiple sclerosis (HCC) - Plan: PT plan of care cert/re-cert  Abdominal weakness - Plan: PT plan of care cert/re-cert  At risk for injury related to fall - Plan: PT plan of care cert/re-cert  Muscle weakness - Plan: PT plan of care cert/re-cert  Abnormal gait - Plan: PT plan of care cert/re-cert  Coordination abnormal - Plan: PT plan of care cert/re-cert  Poor posture - Plan: PT plan of care cert/re-cert      Subjective Assessment - 10/23/15 1443    Subjective Getting from sit to stand is quite difficult, staying inside of walker safely; has had quite a few falls since he stopped PT last year, maybe 10-12 times while he was quad cane. Started using walker after neurologist, also has a custom made wheelchair but does not use it a lot. Has a hard time using wheelchair correctly.    Pertinent History Patient reports that he has had more problems with his balance and has really gotten weak in his core; has had to walk with a walker recently. Has also gotten an AFO to help keep his R knee from hyperextending. Patient reports he does not feel like it is not working the way he wants it to; in process of tracking down orthotist to get another adjustment to his brace.    Patient Stated Goals be able to transfer with more ease, walk better, better balance    Currently in Pain? No/denies            [facility] PT Assessment - 10/23/15 0001    Assessment   Medical Diagnosis multiple sclerosis    Referring Provider Dr. Harlen Labs    Onset Date/Surgical Date --  chronic    Next MD Visit Dr. Renne Crigler June 2017   Prior Therapy recieved PT in early 2016 for MS at this clinic    Precautions   Precautions Fall   Restrictions   Weight Bearing Restrictions No   Balance Screen   Has the patient fallen in the past 6 months Yes   How many times? 2   Has the patient had a decrease in activity level because of a fear of falling?  Yes   Is the patient  reluctant to leave their home because of a fear of falling?  Yes   Prior Function   Level of Independence Independent;Independent with basic ADLs;Independent with gait;Independent with transfers;Other (comment)  needs help getting AFO on    Vocation Part time employment   Leisure no hobbies, just wants to be able to move getter and be more mobile in general   Posture/Postural Control   Posture Comments forward head, B IR shoulders, wide BOS, flexed at hips; seated posture poor with slouching tendency, B IR shoulders, sacral sitting    Strength   Right Hip Flexion 2/5   Right Hip Extension 2+/5  estimated with quality of functional bridge    Right Hip ABduction 2-/5  supine    Left Hip Flexion 2/5   Left Hip Extension 2+/5  estimated by quality of functional bridge   Left Hip ABduction 2+/5  supine    Right Knee Flexion 2+/5  based on  heel slides in supine    Right Knee Extension 3-/5   Left Knee Flexion 2+/5  estimated based on heel slides in supine    Left Knee Extension 4+/5   Right Ankle Dorsiflexion 1/5   Left Ankle Dorsiflexion 4+/5   Palpation   Palpation comment moderate limitations noted in L ankle (AFO on R), moderate hamstring tightness R LE, moderate hip tightness L LE    Bed Mobility   Bed Mobility Rolling Right;Rolling Left;Supine to Sit;Sit to Supine   Rolling Right 3: Mod assist   Rolling Left 3: Mod assist   Supine to Sit 3: Mod assist   Sit to Supine 4: Min assist   Transfers   Transfers Sit to Stand;Stand to Sit   Sit to Stand 4: Min guard   Stand to Sit 4: Min guard   Ambulation/Gait   Gait Comments poor toe clearance R with lots of toe scuffing, reduced swing R/stance L, felxed at hips, unsafe use of walker, unsteadiness, very slow gait speed, high fall risk                            PT Education - 10/23/15 1541    Education provided Yes   Education Details prognosis, HEP, plan of care    Person(s) Educated Patient   Methods Explanation;Handout;Demonstration   Comprehension Verbalized understanding;Returned demonstration;Need further instruction          PT Short Term Goals - 10/23/15 1557    PT SHORT TERM GOAL #1   Title Patient to be able to perform rolling from supine to either side with correct sequencing and only min guard in order to increase independence in mobility at home    Time 4   Period Weeks   Status New   PT SHORT TERM GOAL #2   Title Patient to be able to perform sit to stand and stand to sit from chairs of various heights with correct sequencing and minimal unsteadiness in order to enhance safety and independence in mobility    Time 4   Period Weeks   Status New   PT SHORT TERM GOAL #3   Title Patient to demonstrate improved gait mechanics including reduced toe drag/scuff R LE, improved stance/swing times both LEs, improved posture during  gait, improved gait speed, and safe use of AD,  with cues no mroe than 25% of the time in order to improve efficiency of mobility and safety in gait    Time 4   Period Weeks  Status New   PT SHORT TERM GOAL #4   Title Patient to be independend in wheelchair management and navigation in order to enhance mobility and independence in community    Time 4   Period Weeks   Status New   PT SHORT TERM GOAL #5   Title Patient to be consistent in correctly performing approprate HEP, to be updated PRN    Time 4   Period Weeks   Status New           PT Long Term Goals - 10/23/15 1618    PT LONG TERM GOAL #1   Title Patient to demonstrate strength 4-/5 in all tested muscle groups, especally hip extensors and abductors, in order to enhance mobility skills and reduce fall risk    Time 8   Period Weeks   Status New   PT LONG TERM GOAL #2   Title Patient to be able to perform all bed mobility and functional transfers with mod(I) and rolling walker in order to enhance independence in mobilty at home with minimal fall risk    Time 8   Period Weeks   Status New   PT LONG TERM GOAL #3   Title Patient to report no falls within the past 4 weeks in order to demosntrate improved safety at home and minimal risk of injury with mobility    Time 8   Period Weeks   Status New   PT LONG TERM GOAL #4   Title Patient to be able to perform TUG test in 20 seconds or less with rolling walker in order to demonstrate improved general mobility and balance    Time 8   Period Weeks   Status New   PT LONG TERM GOAL #5   Title Patient to maintain correct posture 80% of the time when sitting on all surfaces in order to demonstrate improved core strength and improved body awareness for mobilty    Time 8   Period Weeks   Status New               Plan - 10/23/15 1546    Clinical Impression Statement Patient arrives with diagnosis of ongoing MS, which has gotten worse over the course of the past year to  the point that he has had at least 10 falls and is now needing to use rolling walker for mobility; he does have AFO but reports that it still needs adjustment, is in process of contacting orthotist to take care of this. He also reports quite a bit of difffiulcty in properly steering and using his wheelchair. Required Min guard escort to safely enter bathroom today with rolling walker but was able to compelte all toileting/dressing with distant supervision. Upon examination, noted severe gait and postural deficits, significant difficulty with balance and unsteadiness,muscle tightness, difficulty with functional mobility, unsafe use of AD, and in general overall high risk of fall. Assigned appropriate HEP today. Recommend skilled PT services in order to address functional limitations, assist in reaching optimal level of function,and reducing overall fall risk.    Pt will benefit from skilled therapeutic intervention in order to improve on the following deficits Abnormal gait;Decreased endurance;Hypomobility;Decreased activity tolerance;Decreased strength;Decreased balance;Decreased mobility;Difficulty walking;Improper body mechanics;Decreased coordination;Decreased safety awareness;Postural dysfunction   Rehab Potential Good   Clinical Impairments Affecting Rehab Potential ongoing MS and complications thereof    PT Frequency 3x / week  3x/week for 4 weeks, 2x/week for next 4 weeks    PT Duration 8 weeks  PT Treatment/Interventions ADLs/Self Care Home Management;DME Instruction;Gait training;Stair training;Functional mobility training;Therapeutic activities;Therapeutic exercise;Balance training;Neuromuscular re-education;Patient/family education;Wheelchair mobility training;Manual techniques;Energy conservation   PT Next Visit Plan review HEP and goals; remind patient to bring [facility] for training; postural training, core strengthing, functional mobility, gait   PT Home Exercise Plan given    Consulted and Agree  with Plan of Care Patient          G-Codes - 2015-11-13 1636    Functional Assessment Tool Used FOTO 59% limited however based on skilled clinical assessment of gait, balance, mobility, safety, fall risk estimate limitation to be about 75%   Functional Limitation Mobility: Walking and moving around   Mobility: Walking and Moving Around Current Status (R6045) At least 60 percent but less than 80 percent impaired, limited or restricted   Mobility: Walking and Moving Around Goal Status (W0981) At least 40 percent but less than 60 percent impaired, limited or restricted       Problem List There are no active problems to display for this patient.   Nedra Hai PT, DPT [phone]  [facility] [address] [place], [place], [address] Phone: [phone]   Fax:  [phone]  Name: ANDREE GOLPHIN MRN: [id number] Date of Birth: 04/10/1971

## 2015-10-28 ENCOUNTER — Ambulatory Visit (HOSPITAL_COMMUNITY): Payer: Medicare Other | Admitting: Physical Therapy

## 2015-10-30 ENCOUNTER — Ambulatory Visit (HOSPITAL_COMMUNITY): Payer: Medicare Other | Admitting: Physical Therapy

## 2015-10-30 ENCOUNTER — Telehealth (HOSPITAL_COMMUNITY): Payer: Self-pay | Admitting: Physical Therapy

## 2015-10-30 NOTE — Telephone Encounter (Signed)
he just got in from seeing the MD in [place], [place] and he doesn't feel good he is very tired. NF 10/30/15

## 2015-11-04 ENCOUNTER — Ambulatory Visit (HOSPITAL_COMMUNITY): Payer: Medicare Other | Admitting: Physical Therapy

## 2015-11-06 ENCOUNTER — Ambulatory Visit (HOSPITAL_COMMUNITY): Payer: Medicare Other | Attending: Neurology

## 2015-11-06 DIAGNOSIS — R293 Abnormal posture: Secondary | ICD-10-CM | POA: Insufficient documentation

## 2015-11-06 DIAGNOSIS — Z9189 Other specified personal risk factors, not elsewhere classified: Secondary | ICD-10-CM | POA: Diagnosis present

## 2015-11-06 DIAGNOSIS — G35 Multiple sclerosis: Secondary | ICD-10-CM | POA: Diagnosis not present

## 2015-11-06 DIAGNOSIS — R198 Other specified symptoms and signs involving the digestive system and abdomen: Secondary | ICD-10-CM | POA: Diagnosis present

## 2015-11-06 DIAGNOSIS — M25659 Stiffness of unspecified hip, not elsewhere classified: Secondary | ICD-10-CM | POA: Diagnosis present

## 2015-11-06 DIAGNOSIS — R278 Other lack of coordination: Secondary | ICD-10-CM | POA: Diagnosis present

## 2015-11-06 DIAGNOSIS — R29898 Other symptoms and signs involving the musculoskeletal system: Secondary | ICD-10-CM | POA: Diagnosis present

## 2015-11-06 DIAGNOSIS — Z9181 History of falling: Secondary | ICD-10-CM

## 2015-11-06 DIAGNOSIS — M6281 Muscle weakness (generalized): Secondary | ICD-10-CM

## 2015-11-06 DIAGNOSIS — Z789 Other specified health status: Secondary | ICD-10-CM | POA: Insufficient documentation

## 2015-11-06 DIAGNOSIS — R269 Unspecified abnormalities of gait and mobility: Secondary | ICD-10-CM | POA: Insufficient documentation

## 2015-11-06 NOTE — Therapy (Signed)
[facility] [address] [place], [place], [address] Phone: [phone]   Fax:  [phone]  Physical Therapy Treatment  Patient Details  Name: Vincent Anthony MRN: [id number] Date of Birth: Apr 23, 1971 Referring Provider: Dr. Harlen Labs   Encounter Date: 11/06/2015      PT End of Session - 11/06/15 1352    Visit Number 2   Number of Visits 20   Date for PT Re-Evaluation 11/20/15   Authorization Type Medicare    Authorization Time Period 10/23/15 to 12/21/15   Authorization - Visit Number 2   Authorization - Number of Visits 10   PT Start Time 1305   PT Stop Time 1350   PT Time Calculation (min) 45 min   Equipment Utilized During Treatment Gait belt   Activity Tolerance Patient tolerated treatment well   Behavior During Therapy [facility] for tasks assessed/performed      Past Medical History  Diagnosis Date  . MS (multiple sclerosis) (HCC)   . Headache   . Tremor     Past Surgical History  Procedure Laterality Date  . Knee surgery    . Hernia repair    . Testicle removal      There were no vitals filed for this visit.  Visit Diagnosis:  Multiple sclerosis (HCC)  Abdominal weakness  At risk for injury related to fall  Muscle weakness  Abnormal gait  Coordination abnormal  Poor posture  Hip stiffness, unspecified laterality  Weakness of left hip  Weakness of right lower extremity      Subjective Assessment - 11/06/15 1359    Subjective Pt walked into dept, no reports of pain.  Reports compliance with HEP has difficulty with bridge and requires wifes assistance to bend Rt knee   Pertinent History Patient reports that he has had more problems with his balance and has really gotten weak in his core; has had to walk with a walker recently. Has also gotten an AFO to help keep his R knee from hyperextending. Patient reports he does not feel like it is not working the way he wants it to; in process of tracking down  orthotist to get another adjustment to his brace.    Patient Stated Goals be able to transfer with more ease, walk better, better balance    Currently in Pain? No/denies                         [facility] Adult PT Treatment/Exercise - 11/06/15 0001    Bed Mobility   Bed Mobility Left Sidelying to Sit;Sit to Sidelying Right;Rolling Right;Rolling Left  5x each direction   Transfers   Transfers Sit to Stand;Stand to Sit   Sit to Stand 4: Min guard   Stand to Sit 4: Min guard   Comments cueing for forward lean to reduce posterior lean upon standing 2 sets x 5 reps   Exercises   Exercises Knee/Hip   Knee/Hip Exercises: Standing   Gait Training Gait training with cueing to increase Rt LE stride length and reduce shuffling assistance leaving dept for safety   Knee/Hip Exercises: Seated   Other Seated Knee/Hip Exercises cone rotation sitting on dynadisc 10cones each UE   Knee/Hip Exercises: Supine   Heel Slides AAROM;Right;10 reps   Heel Slides Limitations knee flex/ext and abd/add   Bridges 10 reps                PT Education - 11/06/15 1417  Education provided Yes   Education Details Reviewed goals, compliance and technique with HEP, copy of eval given, pt educated on benefits of bringing in [facility] for safety transfer   Person(s) Educated Patient   Methods Explanation;Demonstration;Handout   Comprehension Verbalized understanding;Returned demonstration;Verbal cues required          PT Short Term Goals - 10/23/15 1557    PT SHORT TERM GOAL #1   Title Patient to be able to perform rolling from supine to either side with correct sequencing and only min guard in order to increase independence in mobility at home    Time 4   Period Weeks   Status New   PT SHORT TERM GOAL #2   Title Patient to be able to perform sit to stand and stand to sit from chairs of various heights with correct sequencing and minimal unsteadiness in order to enhance safety and independence in  mobility    Time 4   Period Weeks   Status New   PT SHORT TERM GOAL #3   Title Patient to demonstrate improved gait mechanics including reduced toe drag/scuff R LE, improved stance/swing times both LEs, improved posture during gait, improved gait speed, and safe use of AD,  with cues no mroe than 25% of the time in order to improve efficiency of mobility and safety in gait    Time 4   Period Weeks   Status New   PT SHORT TERM GOAL #4   Title Patient to be independend in wheelchair management and navigation in order to enhance mobility and independence in community    Time 4   Period Weeks   Status New   PT SHORT TERM GOAL #5   Title Patient to be consistent in correctly performing approprate HEP, to be updated PRN    Time 4   Period Weeks   Status New           PT Long Term Goals - 10/23/15 1618    PT LONG TERM GOAL #1   Title Patient to demonstrate strength 4-/5 in all tested muscle groups, especally hip extensors and abductors, in order to enhance mobility skills and reduce fall risk    Time 8   Period Weeks   Status New   PT LONG TERM GOAL #2   Title Patient to be able to perform all bed mobility and functional transfers with mod(I) and rolling walker in order to enhance independence in mobilty at home with minimal fall risk    Time 8   Period Weeks   Status New   PT LONG TERM GOAL #3   Title Patient to report no falls within the past 4 weeks in order to demosntrate improved safety at home and minimal risk of injury with mobility    Time 8   Period Weeks   Status New   PT LONG TERM GOAL #4   Title Patient to be able to perform TUG test in 20 seconds or less with rolling walker in order to demonstrate improved general mobility and balance    Time 8   Period Weeks   Status New   PT LONG TERM GOAL #5   Title Patient to maintain correct posture 80% of the time when sitting on all surfaces in order to demonstrate improved core strength and improved body awareness for  mobilty    Time 8   Period Weeks   Status New  Plan - 11/06/15 1501    Clinical Impression Statement Reviewed goals, compliance and technique with HEP and copy of evaluation given.  Session focus on improving mechanics with bed mobitliy and core/ proximal musculature strengthening.  Pt required AAROM with all Rt LE activities due to extreme weakness and spasticity for Rt knee extension.  Min guard and cueng required to improve mechanics with sit to stands with tendency to posterior lean.  Gait training to car at end of session for safety with cueing to increase stride length with Rt LE.  Pt encouraged to bring his wheelchair next session for training to increase ease with WC mobility for safety to reduce risk of fall.  No reports of increased pain through session.    PT Next Visit Plan Continue with current PT POC;  Pt asked to bring [facility] for training next session, core strenghtening, functional mobiltiy, postural training and gait training.        Problem List There are no active problems to display for this patient.  [address], LPTA; CBIS [phone]   Juel Burrow 11/06/2015, 3:13 PM  [facility] [address] [place], [place], [address] Phone: [phone]   Fax:  [phone]  Name: Vincent Anthony MRN: [id number] Date of Birth: 04/28/1971

## 2015-11-08 ENCOUNTER — Encounter (HOSPITAL_COMMUNITY): Payer: Self-pay | Admitting: Occupational Therapy

## 2015-11-08 ENCOUNTER — Ambulatory Visit (HOSPITAL_COMMUNITY): Payer: Medicare Other | Admitting: Occupational Therapy

## 2015-11-08 ENCOUNTER — Ambulatory Visit (HOSPITAL_COMMUNITY): Payer: Medicare Other

## 2015-11-08 DIAGNOSIS — G35 Multiple sclerosis: Secondary | ICD-10-CM

## 2015-11-08 DIAGNOSIS — Z9181 History of falling: Secondary | ICD-10-CM

## 2015-11-08 DIAGNOSIS — R269 Unspecified abnormalities of gait and mobility: Secondary | ICD-10-CM

## 2015-11-08 DIAGNOSIS — R29898 Other symptoms and signs involving the musculoskeletal system: Secondary | ICD-10-CM

## 2015-11-08 DIAGNOSIS — Z789 Other specified health status: Secondary | ICD-10-CM

## 2015-11-08 DIAGNOSIS — R278 Other lack of coordination: Secondary | ICD-10-CM

## 2015-11-08 DIAGNOSIS — R293 Abnormal posture: Secondary | ICD-10-CM

## 2015-11-08 DIAGNOSIS — R198 Other specified symptoms and signs involving the digestive system and abdomen: Secondary | ICD-10-CM

## 2015-11-08 DIAGNOSIS — M6281 Muscle weakness (generalized): Secondary | ICD-10-CM

## 2015-11-08 DIAGNOSIS — M25659 Stiffness of unspecified hip, not elsewhere classified: Secondary | ICD-10-CM

## 2015-11-08 NOTE — Therapy (Signed)
[facility] [address] [place], [place], [address] Phone: [phone]   Fax:  [phone]  Physical Therapy Treatment  Patient Details  Name: Vincent Anthony MRN: [id number] Date of Birth: [birth year] Referring Provider: Dr. Harlen Labs   Encounter Date: 11/08/2015      PT End of Session - 11/08/15 1513    Visit Number 3   Number of Visits 20   Date for PT Re-Evaluation 11/20/15   Authorization Type Medicare    Authorization Time Period 10/23/15 to 12/21/15   Authorization - Visit Number 3   Authorization - Number of Visits 10   PT Start Time 1435   PT Stop Time 1514   PT Time Calculation (min) 39 min   Equipment Utilized During Treatment Gait belt   Activity Tolerance Patient limited by fatigue;Patient tolerated treatment well   Behavior During Therapy [facility] for tasks assessed/performed      Past Medical History  Diagnosis Date  . MS (multiple sclerosis) (HCC)   . Headache   . Tremor     Past Surgical History  Procedure Laterality Date  . Knee surgery    . Hernia repair    . Testicle removal      There were no vitals filed for this visit.  Visit Diagnosis:  Multiple sclerosis (HCC)  Abdominal weakness  At risk for injury related to fall  Muscle weakness  Abnormal gait  Coordination abnormal  Poor posture  Hip stiffness, unspecified laterality  Weakness of left hip  Weakness of right lower extremity      Subjective Assessment - 11/08/15 1431    Subjective Pt walked into dept, stated he forgot WC.  Reports increased soreness following last session pain scale 4/10 BLE    Pertinent History Patient reports that he has had more problems with his balance and has really gotten weak in his core; has had to walk with a walker recently. Has also gotten an AFO to help keep his R knee from hyperextending. Patient reports he does not feel like it is not working the way he wants it to; in process of tracking down  orthotist to get another adjustment to his brace.    Patient Stated Goals be able to transfer with more ease, walk better, better balance    Currently in Pain? Yes   Pain Score 4    Pain Location Leg   Pain Orientation Right;Left   Pain Descriptors / Indicators Sore             [facility] Adult PT Treatment/Exercise - 11/08/15 0001    Bed Mobility   Bed Mobility Left Sidelying to Sit;Rolling Right;Rolling Left;Sit to Sidelying Left   Rolling Right 3: Mod assist   Rolling Right Details (indicate cue type and reason) Cueing for knee   Rolling Left 3: Mod assist   Supine to Sit 3: Mod assist   Sit to Supine 3: Mod assist   Transfers   Transfers Sit to Stand;Stand to Sit   Sit to Stand 4: Min guard   Stand to Sit 4: Min guard   Comments cueing for forward lean to reduce posterior lean upon standing 2 sets x 5 reps   Exercises   Exercises Knee/Hip   Knee/Hip Exercises: Standing   Gait Training Gait training with cueing to increase Rt LE stride length and reduce shuffling    Knee/Hip Exercises: Seated   Other Seated Knee/Hip Exercises cone rotation sitting on dynadisc 10cones each UE  Knee/Hip Exercises: Supine   Heel Slides AAROM;Right;10 reps   Heel Slides Limitations knee flex/ext and abd/add   Bridges 2 sets;10 reps   Bridges Limitations Therapist facilitation with Rt LE   Other Supine Knee/Hip Exercises clam 10x   Other Supine Knee/Hip Exercises ankle pumps 10x                  PT Short Term Goals - 10/23/15 1557    PT SHORT TERM GOAL #1   Title Patient to be able to perform rolling from supine to either side with correct sequencing and only min guard in order to increase independence in mobility at home    Time 4   Period Weeks   Status New   PT SHORT TERM GOAL #2   Title Patient to be able to perform sit to stand and stand to sit from chairs of various heights with correct sequencing and minimal unsteadiness in order to enhance safety and independence in  mobility    Time 4   Period Weeks   Status New   PT SHORT TERM GOAL #3   Title Patient to demonstrate improved gait mechanics including reduced toe drag/scuff R LE, improved stance/swing times both LEs, improved posture during gait, improved gait speed, and safe use of AD,  with cues no mroe than 25% of the time in order to improve efficiency of mobility and safety in gait    Time 4   Period Weeks   Status New   PT SHORT TERM GOAL #4   Title Patient to be independend in wheelchair management and navigation in order to enhance mobility and independence in community    Time 4   Period Weeks   Status New   PT SHORT TERM GOAL #5   Title Patient to be consistent in correctly performing approprate HEP, to be updated PRN    Time 4   Period Weeks   Status New           PT Long Term Goals - 10/23/15 1618    PT LONG TERM GOAL #1   Title Patient to demonstrate strength 4-/5 in all tested muscle groups, especally hip extensors and abductors, in order to enhance mobility skills and reduce fall risk    Time 8   Period Weeks   Status New   PT LONG TERM GOAL #2   Title Patient to be able to perform all bed mobility and functional transfers with mod(I) and rolling walker in order to enhance independence in mobilty at home with minimal fall risk    Time 8   Period Weeks   Status New   PT LONG TERM GOAL #3   Title Patient to report no falls within the past 4 weeks in order to demosntrate improved safety at home and minimal risk of injury with mobility    Time 8   Period Weeks   Status New   PT LONG TERM GOAL #4   Title Patient to be able to perform TUG test in 20 seconds or less with rolling walker in order to demonstrate improved general mobility and balance    Time 8   Period Weeks   Status New   PT LONG TERM GOAL #5   Title Patient to maintain correct posture 80% of the time when sitting on all surfaces in order to demonstrate improved core strength and improved body awareness for  mobilty    Time 8   Period Weeks   Status New  Plan - 11/08/15 1648    Clinical Impression Statement Session focus on improving bed mobiltiy and core/proximal musculature strenghtening.  Verbal cueing to improve form with bed mobiltiy to improve technique with sit to supine and rolling technqiues, increased difficulty due to fatigue and Rt LE weakness/spasticity required mod to max assistance especially with knee flexion.  Cueing with gait to increase stride length for more normalized gait mechanics and energy conservation especially wtih Rt LE advancement.  Pt forgot to bring WC to session, pt encouraged to place in car over weekend for next session as pt. stated he does not use inside home.  No reports of increased pain through session, was limited by fatigue with activity.   PT Next Visit Plan Continue with current PT POC;  Pt asked to bring [facility] for training next session, core strenghtening, functional mobiltiy, postural training and gait training.        Problem List There are no active problems to display for this patient.  [address], LPTA; CBIS [phone]  Juel Burrow 11/08/2015, 5:00 PM  [facility] [address] [place], [place], [address] Phone: [phone]   Fax:  [phone]  Name: Vincent Anthony MRN: [id number] Date of Birth: September 11, 1971

## 2015-11-08 NOTE — Patient Instructions (Signed)
AROM Exercises   1) Wrist Flexion  Start with wrist at edge of table, palm facing up. With wrist hanging slightly off table, curl wrist upward, and back down.      2) Wrist Extension  Start with wrist at edge of table, palm facing down. With wrist slightly off the edge of the table, curl wrist up and back down.      3) Radial Deviations  Start with forearm flat against a table, wrist hanging slightly off the edge, and palm facing the wall. Bending at the wrist only, and keeping palm facing the wall, bend wrist so fist is pointing towards the floor, back up to start position, and up towards the ceiling. Return to start.        4) WRIST PRONATION  Turn your forearm towards palm face down.  Keep your elbow bent and by the side of your  Body.      5) WRIST SUPINATION  Turn your forearm towards palm face up.  Keep your elbow bent and by the side of your  Body.      *Complete exercises _10-15_____ times each, ___1-2____ times per day*

## 2015-11-08 NOTE — Therapy (Signed)
[facility] [address] [place], [place], [address] Phone: [phone]   Fax:  [phone]  Occupational Therapy Evaluation  Patient Details  Name: Vincent Anthony MRN: [id number] Date of Birth: 08/06/1971 Referring Provider: Dr. Harlen Labs  Encounter Date: 11/08/2015      OT End of Session - 11/08/15 1536    Visit Number 1   Number of Visits 12   Date for OT Re-Evaluation 01/07/16  12/06/2015   Authorization Type Medicare/Medicare A & B   Authorization Time Period Before 10th visit   Authorization - Visit Number 1   Authorization - Number of Visits 10   OT Start Time 1350   OT Stop Time 1433   OT Time Calculation (min) 43 min   Activity Tolerance Patient tolerated treatment well   Behavior During Therapy [facility] for tasks assessed/performed      Past Medical History  Diagnosis Date  . MS (multiple sclerosis) (HCC)   . Headache   . Tremor     Past Surgical History  Procedure Laterality Date  . Knee surgery    . Hernia repair    . Testicle removal      There were no vitals filed for this visit.  Visit Diagnosis:  Multiple sclerosis (HCC) - Plan: Ot plan of care cert/re-cert  Decreased grip strength of right hand - Plan: Ot plan of care cert/re-cert  Decreased pinch strength - Plan: Ot plan of care cert/re-cert  Weakness of right upper extremity - Plan: Ot plan of care cert/re-cert  Decreased activities of daily living (ADL) - Plan: Ot plan of care cert/re-cert      Subjective Assessment - 11/08/15 1532    Subjective  S: I would like to be able to use this right hand better.    Pertinent History Pt is a 45 y/o male presenting with multiple sclerosis effecting his RUE and RLE. Pt expresses desire to work on improving strength and ability to use the right hand during daily and work tasks. Dr. Harlen Labs referred pt to occupational therapy for evaluation and treatment.    Patient Stated Goals To have better use of  my right hand.   Currently in Pain? No/denies           [facility] OT Assessment - 11/08/15 1350    Assessment   Diagnosis multiple sclerosis   Referring Provider Dr. Harlen Labs   Onset Date --  chronic condition   Prior Therapy PT in 2016 at [facility]   Precautions   Precautions Fall   Restrictions   Weight Bearing Restrictions No   Balance Screen   Has the patient fallen in the past 6 months No   Has the patient had a decrease in activity level because of a fear of falling?  No   Is the patient reluctant to leave their home because of a fear of falling?  No   Home  Environment   Family/patient expects to be discharged to: Private residence   Living Arrangements Spouse/significant other   Available Help at Discharge [facility] Equipment [place] - 2 wheels;Cane -quad;Wheelchair - manual   Lives With Spouse   Prior Function   Level of Independence Independent with basic ADLs;Independent with gait;Independent with transfers   Vocation Part time employment   Vocation Requirements answering phones, filing   Leisure weight lifting, working out   ADL   Eating/Feeding Set up   Grooming Modified independent   Chief of Staff;Increased time  Lower Body Dressing Moderate assistance  wife assists   Toilet Tranfer Modified independent   Mobility   Mobility Status History of falls   Mobility Status Comments Pt uses walker for functional mobility purposes. Also has custom manual wheelchair for community mobility   Written Expression   Dominant Hand Right   Handwriting 50% legible  print; 25% legible with cursive   Cognition   Overall Cognitive Status Within Functional Limits for tasks assessed   Coordination   9 Hole Peg Test Left;Right   Right 9 Hole Peg Test 2'04"   Left 9 Hole Peg Test 52.80"   ROM / Strength   AROM / PROM / Strength AROM;Strength   AROM   Overall AROM Comments A/ROM is [facility] for shoulder/elbow/wrist   Strength   Strength Assessment Site  Shoulder;Elbow;Wrist;Hand   Right/Left Shoulder Right   Right Shoulder Flexion 4+/5   Right Shoulder ABduction 4+/5   Right Shoulder Internal Rotation 4/5   Right Shoulder External Rotation 4/5   Right/Left Elbow Right   Right Elbow Flexion 4+/5   Right Elbow Extension 4-/5   Right/Left Wrist Right   Right Wrist Flexion 4-/5   Right Wrist Extension 4-/5   Right Wrist Radial Deviation 4-/5   Right Wrist Ulnar Deviation 4-/5   Right/Left hand Right;Left   Right Hand Gross Grasp Impaired   Right Hand Grip (lbs) 45   Right Hand Lateral Pinch 16 lbs   Right Hand 3 Point Pinch --  unable   Left Hand Gross Grasp Functional   Left Hand Grip (lbs) 95   Left Hand Lateral Pinch 24 lbs   Left Hand 3 Point Pinch 17 lbs                         OT Education - 11/08/15 1531    Education provided Yes   Education Details A/ROM wrist exercises    Person(s) Educated Patient   Methods Explanation;Demonstration;Handout   Comprehension Verbalized understanding;Returned demonstration          OT Short Term Goals - 11/08/15 1545    OT SHORT TERM GOAL #1   Title Pt will be educated on and independent in HEP.    Time 6   Period Weeks   Status New   OT SHORT TERM GOAL #2   Title Pt will increase right grip strength by 10# to improve ability to grasp and hold grooming objects during daily routine.    Time 6   Period Weeks   Status New   OT SHORT TERM GOAL #3   Title Pt will increase right pinch strength by 3# to increase ability to grasp writing and eating utensils.     Time 6   Period Weeks   Status New   OT SHORT TERM GOAL #4   Title Pt will increase right wrist strength to 4+/5 to increase ability to use RUE for support when completing daily and work tasks.    Time 6   Period Weeks   Status New   OT SHORT TERM GOAL #5   Title Pt will demonstrate independence in operating manual wheelchair and manuevering around obstacles.    Time 6   Period Weeks   Status New    Additional Short Term Goals   Additional Short Term Goals Yes   OT SHORT TERM GOAL #6   Title Pt will demonstrate independence in loading and unloading wheelchair from car to increase ability to complete community mobility  tasks.     Time 6   Period Weeks   Status New   OT SHORT TERM GOAL #7   Title Pt will increase knowledge of compensatory strategies for ADL completion by verbally relaying current and newly learned strategies to OT.    Time 6   Period Weeks   Status New                  Plan - 05-Dec-2015 1538    Clinical Impression Statement A: Pt is a 45 y/o male presenting with multiple sclerosis predominantly effecting his right side and ability to complete ADL and work tasks using his RUE as dominant. Pt is equipped with a resting hand splint and elbow extension splint for nighttime use, as well as owns a custom manual wheelchair.    Pt will benefit from skilled therapeutic intervention in order to improve on the following deficits (Retired) Decreased strength;Decreased activity tolerance;Impaired UE functional use;Decreased range of motion;Decreased coordination;Impaired flexibility;Decreased knowledge of use of DME   Rehab Potential Good   OT Frequency 2x / week   OT Duration 6 weeks   OT Treatment/Interventions Self-care/ADL training;Energy conservation;Patient/family education;Passive range of motion;Splinting;Therapeutic exercise;Manual Therapy;Therapeutic activities;Functional Mobility Training;DME and/or AE instruction   Plan P: Pt would benefit from skilled occupational therapy services to increase ability to perform ADL tasks at highest level of functioning. Treatment plan: general RUE strengthening, grip and lateral pinch strengthening, wheelchair mobility trainnig, compensatory strategies for ADL completion. Next Session: Complete DASH.    OT Home Exercise Plan A/ROM exercises for wrist   Consulted and Agree with Plan of Care Patient          G-Codes - 12-05-15  1552    Functional Assessment Tool Used clinical judgement   Functional Limitation Carrying, moving and handling objects   Carrying, Moving and Handling Objects Current Status (Z6109) At least 40 percent but less than 60 percent impaired, limited or restricted   Carrying, Moving and Handling Objects Goal Status (U0454) At least 20 percent but less than 40 percent impaired, limited or restricted      Problem List There are no active problems to display for this patient.   Ezra Sites, OTR/L  [phone]  12-05-15, 3:54 PM   [facility] [address] [place], [place], [address] Phone: [phone]   Fax:  [phone]  Name: Vincent Anthony MRN: [id number] Date of Birth: 1970/10/27

## 2015-11-11 ENCOUNTER — Ambulatory Visit (HOSPITAL_COMMUNITY): Payer: Medicare Other | Admitting: Physical Therapy

## 2015-11-11 DIAGNOSIS — R293 Abnormal posture: Secondary | ICD-10-CM

## 2015-11-11 DIAGNOSIS — R29898 Other symptoms and signs involving the musculoskeletal system: Secondary | ICD-10-CM

## 2015-11-11 DIAGNOSIS — R278 Other lack of coordination: Secondary | ICD-10-CM

## 2015-11-11 DIAGNOSIS — R269 Unspecified abnormalities of gait and mobility: Secondary | ICD-10-CM

## 2015-11-11 DIAGNOSIS — M6281 Muscle weakness (generalized): Secondary | ICD-10-CM

## 2015-11-11 DIAGNOSIS — M25659 Stiffness of unspecified hip, not elsewhere classified: Secondary | ICD-10-CM

## 2015-11-11 DIAGNOSIS — G35 Multiple sclerosis: Secondary | ICD-10-CM | POA: Diagnosis not present

## 2015-11-11 DIAGNOSIS — Z9181 History of falling: Secondary | ICD-10-CM

## 2015-11-11 DIAGNOSIS — R198 Other specified symptoms and signs involving the digestive system and abdomen: Secondary | ICD-10-CM

## 2015-11-11 NOTE — Therapy (Signed)
[facility] [address] [place], [place], [address] Phone: [phone]   Fax:  [phone]  Physical Therapy Treatment  Patient Details  Name: Vincent Anthony MRN: [id number] Date of Birth: 1970-10-08 Referring Provider: Dr. Harlen Labs   Encounter Date: 11/11/2015      PT End of Session - 11/11/15 1732    Visit Number 4   Number of Visits 20   Date for PT Re-Evaluation 11/20/15   Authorization Type Medicare    Authorization Time Period 10/23/15 to 12/21/15   Authorization - Visit Number 4   Authorization - Number of Visits 10   PT Start Time 1436   PT Stop Time 1525   PT Time Calculation (min) 49 min   Equipment Utilized During Treatment Gait belt   Activity Tolerance Patient limited by fatigue;Patient tolerated treatment well   Behavior During Therapy [facility] for tasks assessed/performed      Past Medical History  Diagnosis Date  . MS (multiple sclerosis) (HCC)   . Headache   . Tremor     Past Surgical History  Procedure Laterality Date  . Knee surgery    . Hernia repair    . Testicle removal      There were no vitals filed for this visit.  Visit Diagnosis:  Multiple sclerosis (HCC)  Abdominal weakness  At risk for injury related to fall  Muscle weakness  Abnormal gait  Coordination abnormal  Hip stiffness, unspecified laterality  Weakness of left hip  Weakness of right lower extremity  Poor posture      Subjective Assessment - 11/11/15 1721    Subjective Pt comes with spouse today.  Pt reports no pain and states he's been completing his HEP.   Currently in Pain? No/denies                         [facility] Adult PT Treatment/Exercise - 11/11/15 1729    Bed Mobility   Rolling Left 4: Min assist   Supine to Sit 3: Mod assist  due to Assistance with Rt LE needed   Sit to Supine 3: Mod assist  due to assistance needed with Rt LE.   Knee/Hip Exercises: Standing   Gait Training Gait  training with cueing to increase Rt LE stride length and reduce shuffling    Knee/Hip Exercises: Seated   Sit to Sand 5 reps;2 sets;without UE support   Knee/Hip Exercises: Supine   Heel Slides AAROM;Right;10 reps   Bridges 2 sets;15 reps   Bridges Limitations Therapist facilitation to bend up Rt LE then able to complete independently   Other Supine Knee/Hip Exercises clam 10x   Other Supine Knee/Hip Exercises hip abduction AAROM Rt LE, Lt AROM 10 reps                  PT Short Term Goals - 10/23/15 1557    PT SHORT TERM GOAL #1   Title Patient to be able to perform rolling from supine to either side with correct sequencing and only min guard in order to increase independence in mobility at home    Time 4   Period Weeks   Status New   PT SHORT TERM GOAL #2   Title Patient to be able to perform sit to stand and stand to sit from chairs of various heights with correct sequencing and minimal unsteadiness in order to enhance safety and independence in mobility    Time 4  Period Weeks   Status New   PT SHORT TERM GOAL #3   Title Patient to demonstrate improved gait mechanics including reduced toe drag/scuff R LE, improved stance/swing times both LEs, improved posture during gait, improved gait speed, and safe use of AD,  with cues no mroe than 25% of the time in order to improve efficiency of mobility and safety in gait    Time 4   Period Weeks   Status New   PT SHORT TERM GOAL #4   Title Patient to be independend in wheelchair management and navigation in order to enhance mobility and independence in community    Time 4   Period Weeks   Status New   PT SHORT TERM GOAL #5   Title Patient to be consistent in correctly performing approprate HEP, to be updated PRN    Time 4   Period Weeks   Status New           PT Long Term Goals - 10/23/15 1618    PT LONG TERM GOAL #1   Title Patient to demonstrate strength 4-/5 in all tested muscle groups, especally hip extensors  and abductors, in order to enhance mobility skills and reduce fall risk    Time 8   Period Weeks   Status New   PT LONG TERM GOAL #2   Title Patient to be able to perform all bed mobility and functional transfers with mod(I) and rolling walker in order to enhance independence in mobilty at home with minimal fall risk    Time 8   Period Weeks   Status New   PT LONG TERM GOAL #3   Title Patient to report no falls within the past 4 weeks in order to demosntrate improved safety at home and minimal risk of injury with mobility    Time 8   Period Weeks   Status New   PT LONG TERM GOAL #4   Title Patient to be able to perform TUG test in 20 seconds or less with rolling walker in order to demonstrate improved general mobility and balance    Time 8   Period Weeks   Status New   PT LONG TERM GOAL #5   Title Patient to maintain correct posture 80% of the time when sitting on all surfaces in order to demonstrate improved core strength and improved body awareness for mobilty    Time 8   Period Weeks   Status New               Plan - 11/11/15 1736    Clinical Impression Statement Continued focus on improving overall functional strength and mobility. Weakness in Rt LE biggest limitation in ability to complete bed mobility independently.  Added sit to stands without UE from standard height with verval cueing and AA for eccentric control when descending.  Pt requires cues to increase stride with gait, especially with Rt LE.  No increased pain, however noted fatigue at end of session.  Encouraged to continue HEP.   PT Next Visit Plan Continue with current PT POC;  Progress core strengthening, functional mobiltiy, postural training and gait training.        Problem List There are no active problems to display for this patient.   Lurena Nida, PTA/CLT [phone]  11/11/2015, 5:43 PM  [facility] [address] [place], [place],  [address] Phone: [phone]   Fax:  [phone]  Name: Vincent Anthony MRN: [id number] Date  of Birth: Dec 30, 1970

## 2015-11-13 ENCOUNTER — Encounter (HOSPITAL_COMMUNITY): Payer: Medicare Other

## 2015-11-13 ENCOUNTER — Ambulatory Visit (HOSPITAL_COMMUNITY): Payer: Medicare Other | Admitting: Physical Therapy

## 2015-11-15 ENCOUNTER — Telehealth (HOSPITAL_COMMUNITY): Payer: Self-pay

## 2015-11-15 ENCOUNTER — Ambulatory Visit (HOSPITAL_COMMUNITY): Payer: Medicare Other

## 2015-11-15 NOTE — Telephone Encounter (Signed)
No show, called pt. who stated he had called and cancelled apt. due to stomach virus, pt wishes to cancel apt on Monday as well.  [address] LPTA; CBIS [phone]

## 2015-11-18 ENCOUNTER — Encounter (HOSPITAL_COMMUNITY): Payer: Medicare Other | Admitting: Specialist

## 2015-11-18 ENCOUNTER — Ambulatory Visit (HOSPITAL_COMMUNITY): Payer: Medicare Other | Admitting: Physical Therapy

## 2015-11-20 ENCOUNTER — Ambulatory Visit (HOSPITAL_COMMUNITY): Payer: Medicare Other | Admitting: Physical Therapy

## 2015-11-22 ENCOUNTER — Encounter (HOSPITAL_COMMUNITY): Payer: Self-pay | Admitting: Occupational Therapy

## 2015-11-22 ENCOUNTER — Ambulatory Visit (HOSPITAL_COMMUNITY): Payer: Medicare Other | Admitting: Physical Therapy

## 2015-11-22 ENCOUNTER — Ambulatory Visit (HOSPITAL_COMMUNITY): Payer: Medicare Other | Admitting: Occupational Therapy

## 2015-11-22 DIAGNOSIS — R29898 Other symptoms and signs involving the musculoskeletal system: Secondary | ICD-10-CM

## 2015-11-22 DIAGNOSIS — G35 Multiple sclerosis: Secondary | ICD-10-CM

## 2015-11-22 DIAGNOSIS — R293 Abnormal posture: Secondary | ICD-10-CM

## 2015-11-22 DIAGNOSIS — R278 Other lack of coordination: Secondary | ICD-10-CM

## 2015-11-22 DIAGNOSIS — R198 Other specified symptoms and signs involving the digestive system and abdomen: Secondary | ICD-10-CM

## 2015-11-22 DIAGNOSIS — R269 Unspecified abnormalities of gait and mobility: Secondary | ICD-10-CM

## 2015-11-22 DIAGNOSIS — M25659 Stiffness of unspecified hip, not elsewhere classified: Secondary | ICD-10-CM

## 2015-11-22 DIAGNOSIS — M6281 Muscle weakness (generalized): Secondary | ICD-10-CM

## 2015-11-22 DIAGNOSIS — Z9181 History of falling: Secondary | ICD-10-CM

## 2015-11-22 NOTE — Therapy (Signed)
[facility] [address] [place], [place], [address] Phone: [phone]   Fax:  [phone]  Occupational Therapy Treatment  Patient Details  Name: Vincent Anthony MRN: [id number] Date of Birth: 09/14/1971 Referring Provider: Dr. Harlen Labs  Encounter Date: 11/22/2015      OT End of Session - 11/22/15 1240    Visit Number 2   Number of Visits 12   Date for OT Re-Evaluation 01/07/16  12/06/2015   Authorization Type Medicare/Medicare A & B   Authorization Time Period Before 10th visit   Authorization - Visit Number 2   Authorization - Number of Visits 10   OT Start Time 1150   OT Stop Time 1230   OT Time Calculation (min) 40 min   Activity Tolerance Patient tolerated treatment well   Behavior During Therapy [facility] for tasks assessed/performed      Past Medical History  Diagnosis Date  . MS (multiple sclerosis) (HCC)   . Headache   . Tremor     Past Surgical History  Procedure Laterality Date  . Knee surgery    . Hernia repair    . Testicle removal      There were no vitals filed for this visit.  Visit Diagnosis:  Decreased grip strength of right hand  Decreased pinch strength  Weakness of right upper extremity      Subjective Assessment - 11/22/15 1151    Subjective  S: That gripper you gave me is helping my writing.    Currently in Pain? No/denies            [facility] Fall OT Assessment - 11/22/15 1149    Assessment   Diagnosis multiple sclerosis   Precautions   Precautions Fall                  OT Treatments/Exercises (OP) - 11/22/15 1202    Exercises   Exercises Hand;Wrist   Wrist Exercises   Wrist Flexion Strengthening;15 reps   Bar Weights/Barbell (Wrist Flexion) 1 lb   Wrist Extension Strengthening;15 reps   Bar Weights/Barbell (Wrist Extension) 1 lb   Wrist Radial Deviation Strengthening;15 reps   Bar Weights/Barbell (Radial Deviation) 1 lb   Wrist Ulnar Deviation Strengthening;15 reps    Bar Weights/Barbell (Ulnar Deviation) 1 lb   Additional Wrist Exercises   Theraputty Flatten   Theraputty - Flatten red-using heel of hand   Hand Gripper with Large Beads 6/6 beads with gripper at 20#   Hand Gripper with Medium Beads 12/12 with gripper at 20#   Hand Exercises   Other Hand Exercises Pt completed pinch tree using lateral pinch to place clothespins. Pt placed all yellow, red, and green pins, 2 blue, and 1 black pin. Pt used left hand as assist when reaching above shoulder level. Min assist for steadying hand occasionally. Max difficulty placing pins                   OT Short Term Goals - 11/22/15 1243    OT SHORT TERM GOAL #1   Title Pt will be educated on and independent in HEP.    Time 6   Period Weeks   Status On-going   OT SHORT TERM GOAL #2   Title Pt will increase right grip strength by 10# to improve ability to grasp and hold grooming objects during daily routine.    Time 6   Period Weeks   Status On-going   OT SHORT TERM GOAL #3  Title Pt will increase right pinch strength by 3# to increase ability to grasp writing and eating utensils.     Time 6   Period Weeks   Status On-going   OT SHORT TERM GOAL #4   Title Pt will increase right wrist strength to 4+/5 to increase ability to use RUE for support when completing daily and work tasks.    Time 6   Period Weeks   Status On-going   OT SHORT TERM GOAL #5   Title Pt will demonstrate independence in operating manual wheelchair and manuevering around obstacles.    Time 6   Period Weeks   Status On-going   OT SHORT TERM GOAL #6   Title Pt will demonstrate independence in loading and unloading wheelchair from car to increase ability to complete community mobility tasks.     Time 6   Period Weeks   Status On-going   OT SHORT TERM GOAL #7   Title Pt will increase knowledge of compensatory strategies for ADL completion by verbally relaying current and newly learned strategies to OT.    Time 6    Period Weeks   Status On-going                  Plan - 11/22/15 1240    Clinical Impression Statement A: Initiated grip strengthening, lateral pinch strengthening, wrist strengthening exercises, and red theraputty exercises. Pt with max difficulty during grip and pinch exercises, however very motivated to complete each task. Verbal cuing for form and technique.    Plan P: Use a 2# weight for wrist strengthening, provide red theraputty and HEP. Fine motor coordination activity.         Problem List There are no active problems to display for this patient.   Ezra Sites, OTR/L  [phone]  11/22/2015, 12:45 PM  [facility] [address] [place], [place], [address] Phone: [phone]   Fax:  [phone]  Name: Vincent Anthony MRN: [id number] Date of Birth: 1971-01-30

## 2015-11-22 NOTE — Therapy (Signed)
[facility] [address] [place], [place], [address] Phone: [phone]   Fax:  [phone]  Physical Therapy Treatment  Patient Details  Name: Vincent Anthony MRN: [id number] Date of Birth: [birth year] Referring Provider: Dr. Harlen Labs   Encounter Date: 11/22/2015      PT End of Session - 11/22/15 1206    Visit Number 5   Number of Visits 20   Date for PT Re-Evaluation 11/20/15   Authorization Type Medicare    Authorization Time Period 10/23/15 to 12/21/15   Authorization - Visit Number 5   Authorization - Number of Visits 10   PT Start Time 1105   PT Stop Time 1148   PT Time Calculation (min) 43 min   Equipment Utilized During Treatment Gait belt   Activity Tolerance Patient limited by fatigue;Patient tolerated treatment well   Behavior During Therapy [facility] for tasks assessed/performed      Past Medical History  Diagnosis Date  . MS (multiple sclerosis) (HCC)   . Headache   . Tremor     Past Surgical History  Procedure Laterality Date  . Knee surgery    . Hernia repair    . Testicle removal      There were no vitals filed for this visit.  Visit Diagnosis:  Multiple sclerosis (HCC)  Abdominal weakness  At risk for injury related to fall  Muscle weakness  Abnormal gait  Coordination abnormal  Hip stiffness, unspecified laterality  Weakness of left hip  Weakness of right lower extremity  Poor posture      Subjective Assessment - 11/22/15 1207    Subjective Pt states he has not been here in 1 week; had the stomach virus and then fell and was sore.  STates he had to call 911 to help get him up. STates he goes to the gym some with a family member and has been helping to get him stronger.  Currently without pain    Currently in Pain? No/denies                         [facility] Adult PT Treatment/Exercise - 11/22/15 0001    Knee/Hip Exercises: Seated   Other Seated Knee/Hip Exercises sit on  wobble and complete erect sitting posture and rows with RTB   Sit to Sand 5 reps;2 sets;without UE support   Knee/Hip Exercises: Supine   Heel Slides AAROM;Right;10 reps   Heel Slides Limitations in sidelying   Bridges 2 sets;15 reps   Bridges Limitations Therapist facilitation to bend up Rt LE then able to complete independently   Other Supine Knee/Hip Exercises clam Rt LE 10x                  PT Short Term Goals - 10/23/15 1557    PT SHORT TERM GOAL #1   Title Patient to be able to perform rolling from supine to either side with correct sequencing and only min guard in order to increase independence in mobility at home    Time 4   Period Weeks   Status New   PT SHORT TERM GOAL #2   Title Patient to be able to perform sit to stand and stand to sit from chairs of various heights with correct sequencing and minimal unsteadiness in order to enhance safety and independence in mobility    Time 4   Period Weeks   Status New   PT SHORT TERM GOAL #3  Title Patient to demonstrate improved gait mechanics including reduced toe drag/scuff R LE, improved stance/swing times both LEs, improved posture during gait, improved gait speed, and safe use of AD,  with cues no mroe than 25% of the time in order to improve efficiency of mobility and safety in gait    Time 4   Period Weeks   Status New   PT SHORT TERM GOAL #4   Title Patient to be independend in wheelchair management and navigation in order to enhance mobility and independence in community    Time 4   Period Weeks   Status New   PT SHORT TERM GOAL #5   Title Patient to be consistent in correctly performing approprate HEP, to be updated PRN    Time 4   Period Weeks   Status New           PT Long Term Goals - 10/23/15 1618    PT LONG TERM GOAL #1   Title Patient to demonstrate strength 4-/5 in all tested muscle groups, especally hip extensors and abductors, in order to enhance mobility skills and reduce fall risk     Time 8   Period Weeks   Status New   PT LONG TERM GOAL #2   Title Patient to be able to perform all bed mobility and functional transfers with mod(I) and rolling walker in order to enhance independence in mobilty at home with minimal fall risk    Time 8   Period Weeks   Status New   PT LONG TERM GOAL #3   Title Patient to report no falls within the past 4 weeks in order to demosntrate improved safety at home and minimal risk of injury with mobility    Time 8   Period Weeks   Status New   PT LONG TERM GOAL #4   Title Patient to be able to perform TUG test in 20 seconds or less with rolling walker in order to demonstrate improved general mobility and balance    Time 8   Period Weeks   Status New   PT LONG TERM GOAL #5   Title Patient to maintain correct posture 80% of the time when sitting on all surfaces in order to demonstrate improved core strength and improved body awareness for mobilty    Time 8   Period Weeks   Status New               Plan - 11/22/15 1210    Clinical Impression Statement Resumed focus on improving functional strength.  Pt very motivated to improve and compliant with all actvities.  Worked on sitting balance with functional actvities with inability to maintain erect seated posture greater than 10 seconds.  Pt tends to lean to Rt side and increase extension posture due to weak core.  Discussed importance of increasing postural awareness and changing seating at home to encourage this.  Pt currently sits "slumped" on couch per wife.  Pt requires AAROM with most all actvities using Rt LE.   PT Next Visit Plan Continue with current PT POC;  Progress core strengthening, functional mobiltiy, postural training and gait training.        Problem List There are no active problems to display for this patient.   Lurena Nida, PTA/CLT [phone]  11/22/2015, 4:22 PM  [facility] [address] [place], [place],  [address] Phone: [phone]   Fax:  [phone]  Name: Vincent Anthony MRN: [id number]  Date of Birth: [birth year]

## 2015-11-25 ENCOUNTER — Ambulatory Visit (HOSPITAL_COMMUNITY): Payer: Medicare Other | Admitting: Physical Therapy

## 2015-11-25 ENCOUNTER — Telehealth (HOSPITAL_COMMUNITY): Payer: Self-pay | Admitting: Physical Therapy

## 2015-11-25 NOTE — Telephone Encounter (Signed)
He can not come today but will be here on Wed March 3rd.

## 2015-11-27 ENCOUNTER — Ambulatory Visit (HOSPITAL_COMMUNITY): Payer: Medicare Other | Admitting: Occupational Therapy

## 2015-11-27 ENCOUNTER — Telehealth (HOSPITAL_COMMUNITY): Payer: Self-pay | Admitting: Occupational Therapy

## 2015-11-27 ENCOUNTER — Encounter (HOSPITAL_COMMUNITY): Payer: Medicare Other | Admitting: Occupational Therapy

## 2015-11-27 ENCOUNTER — Encounter (HOSPITAL_COMMUNITY): Payer: Medicare Other

## 2015-11-27 NOTE — Telephone Encounter (Signed)
Pt called said he could not make this apptment he has car problems. NF 1:58 pm

## 2015-11-29 ENCOUNTER — Ambulatory Visit (HOSPITAL_COMMUNITY): Payer: Medicare Other | Admitting: Occupational Therapy

## 2015-11-29 ENCOUNTER — Encounter (HOSPITAL_COMMUNITY): Payer: Medicare Other | Admitting: Occupational Therapy

## 2015-11-29 ENCOUNTER — Encounter (HOSPITAL_COMMUNITY): Payer: Self-pay | Admitting: Occupational Therapy

## 2015-11-29 ENCOUNTER — Ambulatory Visit (HOSPITAL_COMMUNITY): Payer: Medicare Other

## 2015-11-29 ENCOUNTER — Ambulatory Visit (HOSPITAL_COMMUNITY): Payer: Medicare Other | Attending: Neurology

## 2015-11-29 DIAGNOSIS — Z789 Other specified health status: Secondary | ICD-10-CM | POA: Insufficient documentation

## 2015-11-29 DIAGNOSIS — M25659 Stiffness of unspecified hip, not elsewhere classified: Secondary | ICD-10-CM | POA: Diagnosis not present

## 2015-11-29 DIAGNOSIS — R29898 Other symptoms and signs involving the musculoskeletal system: Secondary | ICD-10-CM | POA: Diagnosis present

## 2015-11-29 DIAGNOSIS — R278 Other lack of coordination: Secondary | ICD-10-CM | POA: Diagnosis present

## 2015-11-29 DIAGNOSIS — R198 Other specified symptoms and signs involving the digestive system and abdomen: Secondary | ICD-10-CM | POA: Insufficient documentation

## 2015-11-29 DIAGNOSIS — R269 Unspecified abnormalities of gait and mobility: Secondary | ICD-10-CM | POA: Insufficient documentation

## 2015-11-29 DIAGNOSIS — Z9189 Other specified personal risk factors, not elsewhere classified: Secondary | ICD-10-CM | POA: Diagnosis present

## 2015-11-29 DIAGNOSIS — G35 Multiple sclerosis: Secondary | ICD-10-CM | POA: Insufficient documentation

## 2015-11-29 DIAGNOSIS — M6281 Muscle weakness (generalized): Secondary | ICD-10-CM | POA: Insufficient documentation

## 2015-11-29 DIAGNOSIS — R293 Abnormal posture: Secondary | ICD-10-CM | POA: Diagnosis present

## 2015-11-29 NOTE — Therapy (Signed)
[facility] [address] [place], [place], [address] Phone: [phone]   Fax:  [phone]  Occupational Therapy Treatment  Patient Details  Name: Vincent Anthony MRN: [id number] Date of Birth: 1970/11/23 Referring Provider: Dr. Harlen Labs  Encounter Date: 11/29/2015      OT End of Session - 11/29/15 1526    Visit Number 3   Number of Visits 12   Date for OT Re-Evaluation 01/07/16  12/06/2015   Authorization Type Medicare/Medicare A & B   Authorization Time Period Before 10th visit   Authorization - Visit Number 3   Authorization - Number of Visits 10   OT Start Time 1430   OT Stop Time 1517   OT Time Calculation (min) 47 min   Activity Tolerance Patient tolerated treatment well   Behavior During Therapy [facility] for tasks assessed/performed      Past Medical History  Diagnosis Date  . MS (multiple sclerosis) (HCC)   . Headache   . Tremor     Past Surgical History  Procedure Laterality Date  . Knee surgery    . Hernia repair    . Testicle removal      There were no vitals filed for this visit.  Visit Diagnosis:  Decreased grip strength of right hand  Decreased pinch strength  Weakness of right upper extremity      Subjective Assessment - 11/29/15 1433    Subjective  S: I'm going to go get me one of those exercise balls that you squeeze.    Currently in Pain? No/denies            [facility] OT Assessment - 11/29/15 1429    Assessment   Diagnosis multiple sclerosis   Precautions   Precautions Fall                  OT Treatments/Exercises (OP) - 11/29/15 1436    Exercises   Exercises Hand;Wrist   Wrist Exercises   Wrist Flexion Strengthening;15 reps   Bar Weights/Barbell (Wrist Flexion) 2 lbs   Wrist Extension Strengthening;15 reps   Bar Weights/Barbell (Wrist Extension) 2 lbs   Wrist Radial Deviation Strengthening;15 reps   Bar Weights/Barbell (Radial Deviation) 2 lbs   Wrist Ulnar Deviation  Strengthening;15 reps   Bar Weights/Barbell (Ulnar Deviation) 2 lbs   Additional Wrist Exercises   Hand Gripper with Large Beads 6/6 beads with gripper at 20#   Fine Motor Coordination   Fine Motor Coordination Small Pegboard   Small Pegboard Pt completed small pegboard pattern, using lateral pinch with right hand to place pegs. Pt required increased time to complete. Pt using shoulder elevation to assist in positioning pegs.                 OT Education - 11/29/15 1526    Education provided Yes   Education Details red theraputty exercises   Person(s) Educated Patient   Methods Explanation;Demonstration;Handout   Comprehension Verbalized understanding;Returned demonstration          OT Short Term Goals - 11/22/15 1243    OT SHORT TERM GOAL #1   Title Pt will be educated on and independent in HEP.    Time 6   Period Weeks   Status On-going   OT SHORT TERM GOAL #2   Title Pt will increase right grip strength by 10# to improve ability to grasp and hold grooming objects during daily routine.    Time 6   Period Weeks  Status On-going   OT SHORT TERM GOAL #3   Title Pt will increase right pinch strength by 3# to increase ability to grasp writing and eating utensils.     Time 6   Period Weeks   Status On-going   OT SHORT TERM GOAL #4   Title Pt will increase right wrist strength to 4+/5 to increase ability to use RUE for support when completing daily and work tasks.    Time 6   Period Weeks   Status On-going   OT SHORT TERM GOAL #5   Title Pt will demonstrate independence in operating manual wheelchair and manuevering around obstacles.    Time 6   Period Weeks   Status On-going   OT SHORT TERM GOAL #6   Title Pt will demonstrate independence in loading and unloading wheelchair from car to increase ability to complete community mobility tasks.     Time 6   Period Weeks   Status On-going   OT SHORT TERM GOAL #7   Title Pt will increase knowledge of compensatory  strategies for ADL completion by verbally relaying current and newly learned strategies to OT.    Time 6   Period Weeks   Status On-going                  Plan - 11/29/15 1533    Clinical Impression Statement A: Added fine motor coordination task, increased wrist strengthening to 2#, continued grip strengthening. Pt required increased time for fine motor activity, was only able to complete large beads with hand gripper due to fatigue. Provided red theraputty HEP.    Plan P: Follow up on theraputty HEP. Add pinch task with sponges, provide wrist strengthening HEP.         Problem List There are no active problems to display for this patient.   Ezra Sites, OTR/L  [phone]  11/29/2015, 3:35 PM  [facility] [address] [place], [place], [address] Phone: [phone]   Fax:  [phone]  Name: Vincent Anthony MRN: [id number] Date of Birth: September 22, 1971

## 2015-11-29 NOTE — Therapy (Signed)
[facility] [address] [place], [place], [address] Phone: [phone]   Fax:  [phone]  Physical Therapy Treatment  Patient Details  Name: Vincent Anthony MRN: [id number] Date of Birth: [birth year] Referring Provider: Dr. Harlen Labs   Encounter Date: 11/29/2015      PT End of Session - 11/29/15 1422    Visit Number 6   Number of Visits 20   Date for PT Re-Evaluation 11/20/15   Authorization Type Medicare    Authorization Time Period 10/23/15 to 12/21/15   Authorization - Visit Number 6   Authorization - Number of Visits 10   PT Start Time 1358   PT Stop Time 1430   PT Time Calculation (min) 32 min   Equipment Utilized During Treatment Gait belt   Activity Tolerance Patient limited by fatigue;Patient tolerated treatment well   Behavior During Therapy [facility] for tasks assessed/performed      Past Medical History  Diagnosis Date  . MS (multiple sclerosis) (HCC)   . Headache   . Tremor     Past Surgical History  Procedure Laterality Date  . Knee surgery    . Hernia repair    . Testicle removal      There were no vitals filed for this visit.  Visit Diagnosis:  Hip stiffness, unspecified laterality  Weakness of left hip  Weakness of right lower extremity  Poor posture  Abnormal gait  Muscle weakness      Subjective Assessment - 11/29/15 1406    Subjective Pt late for apt today.  Currently pain free.  Continues to go to [facility] on days he doesnt have PT.   Pertinent History Patient reports that he has had more problems with his balance and has really gotten weak in his core; has had to walk with a walker recently. Has also gotten an AFO to help keep his R knee from hyperextending. Patient reports he does not feel like it is not working the way he wants it to; in process of tracking down orthotist to get another adjustment to his brace.    Patient Stated Goals be able to transfer with more ease, walk better, better  balance    Currently in Pain? No/denies               [facility] Adult PT Treatment/Exercise - 11/29/15 0001    Bed Mobility   Rolling Right 4: Min assist   Rolling Left 3: Mod assist   Supine to Sit 3: Mod assist   Sit to Supine 3: Mod assist  A required for Rt LE   Knee/Hip Exercises: Standing   Gait Training Cueing to advance Rt LE with gait for safety x 70 feet   Knee/Hip Exercises: Seated   Sit to Sand 5 reps;2 sets;without UE support   Knee/Hip Exercises: Supine   Heel Slides AAROM;Right;10 reps   Heel Slides Limitations in sidelying   Bridges 2 sets;15 reps   Bridges Limitations Therapist facilitation to bend up Rt LE then able to complete independently   Other Supine Knee/Hip Exercises clam Rt LE 10x            PT Short Term Goals - 10/23/15 1557    PT SHORT TERM GOAL #1   Title Patient to be able to perform rolling from supine to either side with correct sequencing and only min guard in order to increase independence in mobility at home    Time 4   Period Weeks  Status New   PT SHORT TERM GOAL #2   Title Patient to be able to perform sit to stand and stand to sit from chairs of various heights with correct sequencing and minimal unsteadiness in order to enhance safety and independence in mobility    Time 4   Period Weeks   Status New   PT SHORT TERM GOAL #3   Title Patient to demonstrate improved gait mechanics including reduced toe drag/scuff R LE, improved stance/swing times both LEs, improved posture during gait, improved gait speed, and safe use of AD,  with cues no mroe than 25% of the time in order to improve efficiency of mobility and safety in gait    Time 4   Period Weeks   Status New   PT SHORT TERM GOAL #4   Title Patient to be independend in wheelchair management and navigation in order to enhance mobility and independence in community    Time 4   Period Weeks   Status New   PT SHORT TERM GOAL #5   Title Patient to be consistent in correctly  performing approprate HEP, to be updated PRN    Time 4   Period Weeks   Status New           PT Long Term Goals - 10/23/15 1618    PT LONG TERM GOAL #1   Title Patient to demonstrate strength 4-/5 in all tested muscle groups, especally hip extensors and abductors, in order to enhance mobility skills and reduce fall risk    Time 8   Period Weeks   Status New   PT LONG TERM GOAL #2   Title Patient to be able to perform all bed mobility and functional transfers with mod(I) and rolling walker in order to enhance independence in mobilty at home with minimal fall risk    Time 8   Period Weeks   Status New   PT LONG TERM GOAL #3   Title Patient to report no falls within the past 4 weeks in order to demosntrate improved safety at home and minimal risk of injury with mobility    Time 8   Period Weeks   Status New   PT LONG TERM GOAL #4   Title Patient to be able to perform TUG test in 20 seconds or less with rolling walker in order to demonstrate improved general mobility and balance    Time 8   Period Weeks   Status New   PT LONG TERM GOAL #5   Title Patient to maintain correct posture 80% of the time when sitting on all surfaces in order to demonstrate improved core strength and improved body awareness for mobilty    Time 8   Period Weeks   Status New               Plan - 11/29/15 1443    Clinical Impression Statement Pt presents with increased weakness as he has not been to therapy since 11/22/2015 due to car issues.  Session foucs on improving bed mobility and functional strengthening.  Pt continues to require mod assistnace with Rt LE with all bed mobilty exercises including sit to supine, AAROM wiith abduction and heel slides.  Increased assistance required this session with heel slides, pt stated he feels it is related to the cold weather outside. Pt able to complete rolling to Rt side independently, mod assistance with Rt knee bending and cueing to reach Rt UE across  body to assist with rolling  to Lt.  No reports of pain through session, was limited by fatigue with activites.     Pt will benefit from skilled therapeutic intervention in order to improve on the following deficits Abnormal gait;Decreased endurance;Hypomobility;Decreased activity tolerance;Decreased strength;Decreased balance;Decreased mobility;Difficulty walking;Improper body mechanics;Decreased coordination;Decreased safety awareness;Postural dysfunction   Clinical Impairments Affecting Rehab Potential ongoing MS and complications thereof    PT Frequency 3x / week   PT Duration 8 weeks   PT Treatment/Interventions ADLs/Self Care Home Management;DME Instruction;Gait training;Stair training;Functional mobility training;Therapeutic activities;Therapeutic exercise;Balance training;Neuromuscular re-education;Patient/family education;Wheelchair mobility training;Manual techniques;Energy conservation   PT Next Visit Plan Continue with current PT POC;  Progress core strengthening, functional mobiltiy, postural training and gait training.        Problem List There are no active problems to display for this patient.  [address], LPTA; CBIS [phone]  Juel Burrow 11/29/2015, 5:51 PM  [facility] [address] [place], [place], [address] Phone: [phone]   Fax:  [phone]  Name: Vincent Anthony MRN: [id number] Date of Birth: 11-28-1970

## 2015-11-29 NOTE — Patient Instructions (Signed)
Home Exercises Program °Theraputty Exercises °   °Do the following exercises 1-2 times a day using your affected hand.  °1. Roll putty into a ball. ° °2. Make into a pancake. ° °3. Roll putty into a roll. ° °4. Pinch along log with first finger and thumb.  ° °5. Make into a ball. ° °6. Roll it back into a log.  ° °7. Pinch using thumb and side of first finger. ° °8. Roll into a ball, then flatten into a pancake. ° °9. Using your fingers, make putty into a mountain. ° °

## 2015-12-02 ENCOUNTER — Ambulatory Visit (HOSPITAL_COMMUNITY): Payer: Medicare Other | Admitting: Occupational Therapy

## 2015-12-02 ENCOUNTER — Ambulatory Visit (HOSPITAL_COMMUNITY): Payer: Medicare Other | Admitting: Physical Therapy

## 2015-12-02 ENCOUNTER — Encounter (HOSPITAL_COMMUNITY): Payer: Medicare Other | Admitting: Occupational Therapy

## 2015-12-02 ENCOUNTER — Encounter (HOSPITAL_COMMUNITY): Payer: Self-pay | Admitting: Occupational Therapy

## 2015-12-02 DIAGNOSIS — Z9181 History of falling: Secondary | ICD-10-CM

## 2015-12-02 DIAGNOSIS — R29898 Other symptoms and signs involving the musculoskeletal system: Secondary | ICD-10-CM

## 2015-12-02 DIAGNOSIS — M6281 Muscle weakness (generalized): Secondary | ICD-10-CM

## 2015-12-02 DIAGNOSIS — R293 Abnormal posture: Secondary | ICD-10-CM

## 2015-12-02 DIAGNOSIS — R198 Other specified symptoms and signs involving the digestive system and abdomen: Secondary | ICD-10-CM

## 2015-12-02 DIAGNOSIS — R278 Other lack of coordination: Secondary | ICD-10-CM

## 2015-12-02 DIAGNOSIS — M25659 Stiffness of unspecified hip, not elsewhere classified: Secondary | ICD-10-CM | POA: Diagnosis not present

## 2015-12-02 DIAGNOSIS — R269 Unspecified abnormalities of gait and mobility: Secondary | ICD-10-CM

## 2015-12-02 DIAGNOSIS — G35 Multiple sclerosis: Secondary | ICD-10-CM

## 2015-12-02 NOTE — Therapy (Signed)
[facility] [address] [place], [place], [address] Phone: [phone]   Fax:  [phone]  Occupational Therapy Treatment  Patient Details  Name: Vincent Anthony MRN: [id number] Date of Birth: [birth year] Referring Provider: Dr. Harlen Labs  Encounter Date: 12/02/2015      OT End of Session - 12/02/15 1507    Visit Number 4   Number of Visits 12   Date for OT Re-Evaluation 01/07/16  12/06/2015   Authorization Type Medicare/Medicare A & B   Authorization Time Period Before 10th visit   Authorization - Visit Number 4   Authorization - Number of Visits 10   OT Start Time 1430   OT Stop Time 1517   OT Time Calculation (min) 47 min   Activity Tolerance Patient tolerated treatment well   Behavior During Therapy [facility] for tasks assessed/performed      Past Medical History  Diagnosis Date  . MS (multiple sclerosis) (HCC)   . Headache   . Tremor     Past Surgical History  Procedure Laterality Date  . Knee surgery    . Hernia repair    . Testicle removal      There were no vitals filed for this visit.  Visit Diagnosis:  Decreased grip strength of right hand  Decreased pinch strength      Subjective Assessment - 12/02/15 1431    Subjective  S: I worked with that putty this weekend.    Currently in Pain? Yes   Pain Score 4    Pain Location Knee   Pain Orientation Right;Left   Pain Descriptors / Indicators Dull;Tingling   Pain Type Chronic pain   Pain Radiating Towards behind knees   Pain Onset More than a month ago   Pain Frequency Constant   Aggravating Factors  prolonged standing   Pain Relieving Factors laying down, raising legs up   Effect of Pain on Daily Activities limited participation during standing activities   Multiple Pain Sites No            [facility] OT Assessment - 12/02/15 1426    Assessment   Diagnosis multiple sclerosis   Precautions   Precautions Fall                  OT  Treatments/Exercises (OP) - 12/02/15 1436    Exercises   Exercises Hand;Wrist   Additional Wrist Exercises   Hand Gripper with Medium Beads 11/12 with gripper at 20#   Hand Exercises   Other Hand Exercises Pt used yellow resistive clothespin to place 5 high resistance sponges into container. Max difficulty and increased time to complete task. Pt used left hand to assist in stabilizing sponges.                 OT Education - 12/02/15 1459    Education provided Yes   Education Details wrist strengthening exercises   Person(s) Educated Patient   Methods Explanation;Demonstration;Handout   Comprehension Verbalized understanding;Returned demonstration          OT Short Term Goals - 11/22/15 1243    OT SHORT TERM GOAL #1   Title Pt will be educated on and independent in HEP.    Time 6   Period Weeks   Status On-going   OT SHORT TERM GOAL #2   Title Pt will increase right grip strength by 10# to improve ability to grasp and hold grooming objects during daily routine.    Time 6  Period Weeks   Status On-going   OT SHORT TERM GOAL #3   Title Pt will increase right pinch strength by 3# to increase ability to grasp writing and eating utensils.     Time 6   Period Weeks   Status On-going   OT SHORT TERM GOAL #4   Title Pt will increase right wrist strength to 4+/5 to increase ability to use RUE for support when completing daily and work tasks.    Time 6   Period Weeks   Status On-going   OT SHORT TERM GOAL #5   Title Pt will demonstrate independence in operating manual wheelchair and manuevering around obstacles.    Time 6   Period Weeks   Status On-going   OT SHORT TERM GOAL #6   Title Pt will demonstrate independence in loading and unloading wheelchair from car to increase ability to complete community mobility tasks.     Time 6   Period Weeks   Status On-going   OT SHORT TERM GOAL #7   Title Pt will increase knowledge of compensatory strategies for ADL completion  by verbally relaying current and newly learned strategies to OT.    Time 6   Period Weeks   Status On-going                  Plan - 12/02/15 1507    Clinical Impression Statement A: Focus on grip and pinch strengthening this session. Pt able to complete grip strengthening with medium sized beads, 1 extended rest break and increased time required. Pt with max difficulty with lateral pinch task, requiring increased time and use of LUE for stabilizing sponges on table. Provided wrist strengthening HEP.    Plan P: Follow up on & resume wrist strengthening exercises. Theraputty task-use PVC pipe to cut out circles        Problem List There are no active problems to display for this patient.  Ezra Sites, OTR/L  [phone]  12/02/2015, 4:02 PM  [facility] [address] [place], [place], [address] Phone: [phone]   Fax:  [phone]  Name: Vincent Anthony MRN: [id number] Date of Birth: [birth year]

## 2015-12-02 NOTE — Therapy (Signed)
[place] [address] [place], [place], [address] Phone: [phone]   Fax:  [phone]  Physical Therapy Treatment (Re-Assessment)  Patient Details  Name: SKYY MCKNIGHT MRN: [id number] Date of Birth: 02/16/1971 Referring Provider: Dr. Ala Bent   Encounter Date: 12/02/2015      PT End of Session - 12/02/15 1438    Visit Number 7   Number of Visits 20   Date for PT Re-Evaluation 12/30/15   Authorization Type Medicare (G-codes done 7th visit)   Authorization Time Period 10/23/15 to 12/21/15   Authorization - Visit Number 7   Authorization - Number of Visits 17   PT Start Time 4656  Pt arrived late   PT Stop Time 1430   PT Time Calculation (min) 38 min   Equipment Utilized During Treatment Gait belt   Activity Tolerance Patient limited by fatigue;Patient tolerated treatment well   Behavior During Therapy [facility] for tasks assessed/performed      Past Medical History  Diagnosis Date  . MS (multiple sclerosis) ([place])   . Headache   . Tremor     Past Surgical History  Procedure Laterality Date  . Knee surgery    . Hernia repair    . Testicle removal      There were no vitals filed for this visit.  Visit Diagnosis:  Multiple sclerosis ([place])  Weakness of left hip  Weakness of right lower extremity  Poor posture  Abnormal gait  Muscle weakness  Abdominal weakness  At risk for injury related to fall  Coordination abnormal      Subjective Assessment - 12/02/15 1357    Subjective Patient arrived a few minutes late for apponitment. Reports he has good days and bad days. Having an easier time with his walking and making his step lenghts longer; sit to stand is getting easier too. Balance is still a challenge for him; had a close call as he was getting ready to go to church, almost fell in hallway when he was trying to manuever without walker.    Pertinent History Patient reports that he has had more problems with  his balance and has really gotten weak in his core; has had to walk with a walker recently. Has also gotten an AFO to help keep his R knee from hyperextending. Patient reports he does not feel like it is not working the way he wants it to; in process of tracking down orthotist to get another adjustment to his brace.    Patient Stated Goals be able to transfer with more ease, walk better, better balance    Currently in Pain? Yes   Pain Score 4    Pain Location Knee   Pain Orientation Right;Left   Pain Descriptors / Indicators Dull;Tingling   Pain Type Chronic pain   Pain Radiating Towards can go behind his knees    Pain Onset More than a month ago   Pain Frequency Constant   Aggravating Factors  trying to stand for a long time   Pain Relieving Factors laying down and elevating legs, TENS    Effect of Pain on Daily Activities limits how long he can participate in activity             [facility] PT Assessment - 12/02/15 0001    Observation/Other Assessments   Observations R solid AFO donned; trunk strength 3 to 3+/5   Strength   Right Hip Flexion 2/5   Left Hip Flexion 2/5   Left  Hip ABduction 2/5   Right Knee Extension 3+/5   Left Knee Extension 4+/5   Left Ankle Dorsiflexion 4+/5   Bed Mobility   Rolling Right 4: Min assist   Rolling Left 3: Mod assist   Supine to Sit 3: Mod assist   Sitting - Scoot to Edge of Bed 6: Modified independent (Device/Increase time);5: Supervision  in supine   Sit to Supine 3: Mod assist   Transfers   Transfers Sit to Stand;Stand to Sit   Sit to Stand 4: Min assist   Number of Reps Other reps (comment)  x5 reps   Ambulation/Gait   Ambulation Distance (Feet) 140 Feet   Gait Comments difficulty with R foot clearance, alternation between vaulting and hip hiking R, poor posture, reduced gait speed, reduced DF ankle R, occasional knee hyperetension R                              PT Education - 12/02/15 1438    Education provided  Yes   Education Details HEP adherence; progress with skilled PT services, plan of care moving forward    Person(s) Educated Patient   Methods Explanation   Comprehension Verbalized understanding          PT Short Term Goals - 12/02/15 1422    PT SHORT TERM GOAL #1   Title Patient to be able to perform rolling from supine to either side with correct sequencing and only min guard in order to increase independence in mobility at home    Time 4   Period Weeks   Status On-going   PT SHORT TERM GOAL #2   Title Patient to be able to perform sit to stand and stand to sit from chairs of various heights with correct sequencing and minimal unsteadiness in order to enhance safety and independence in mobility    Time 4   Period Weeks   Status On-going   PT SHORT TERM GOAL #3   Title Patient to demonstrate improved gait mechanics including reduced toe drag/scuff R LE, improved stance/swing times both LEs, improved posture during gait, improved gait speed, and safe use of AD,  with cues no mroe than 25% of the time in order to improve efficiency of mobility and safety in gait    Time 4   Period Weeks   Status On-going   PT SHORT TERM GOAL #4   Title Patient to be independend in wheelchair management and navigation in order to enhance mobility and independence in community    Time 4   Period Weeks   Status On-going   PT SHORT TERM GOAL #5   Title Patient to be consistent in correctly performing approprate HEP, to be updated PRN    Time 4   Period Weeks   Status On-going           PT Long Term Goals - 12/02/15 1425    PT LONG TERM GOAL #1   Title Patient to demonstrate strength 4-/5 in all tested muscle groups, especally hip extensors and abductors, in order to enhance mobility skills and reduce fall risk    Time 8   Period Weeks   Status On-going   PT LONG TERM GOAL #2   Title Patient to be able to perform all bed mobility and functional transfers with mod(I) and rolling walker in  order to enhance independence in mobilty at home with minimal fall risk    Time 8  Period Weeks   Status On-going   PT LONG TERM GOAL #3   Title Patient to report no falls within the past 4 weeks in order to demosntrate improved safety at home and minimal risk of injury with mobility    Baseline 07-Dec-2015* fall one week ago    Time 8   Period Weeks   Status On-going   PT LONG TERM GOAL #4   Title Patient to be able to perform TUG test in 20 seconds or less with rolling walker in order to demonstrate improved general mobility and balance    Time 8   Period Weeks   Status On-going   PT LONG TERM GOAL #5   Title Patient to maintain correct posture 80% of the time when sitting on all surfaces in order to demonstrate improved core strength and improved body awareness for mobilty    Time 8   Period Weeks   Status On-going               Plan - 12-07-15 1441    Clinical Impression Statement Pt presents with gait abnormalities and limited mobility secondary to decreased strength (R>L), ROM, stability throughout his trunk and BLE. He has not met any of his goals at this time, but her is progressing towards some. He has reported 1 near fall and 1 fall since his initial evaluation, noted when not using his AD. He requires moderate levels of assistance with transitions from sit to stand and crashes onto surfaces secondary to poor quad control during activity. He continues to require assistance with RLE advancement during bed mobility. He would benefit from continued PT services 2x/week for 8 weeks to address his limitations and improve safety and independence with function at home and in the community.    Pt will benefit from skilled therapeutic intervention in order to improve on the following deficits Abnormal gait;Decreased endurance;Hypomobility;Decreased activity tolerance;Decreased strength;Decreased balance;Decreased mobility;Difficulty walking;Improper body mechanics;Decreased  coordination;Decreased safety awareness;Postural dysfunction;Decreased range of motion;Pain   Rehab Potential Good   Clinical Impairments Affecting Rehab Potential ongoing MS and complications thereof    PT Frequency 2x / week   PT Duration 8 weeks   PT Treatment/Interventions ADLs/Self Care Home Management;DME Instruction;Gait training;Stair training;Functional mobility training;Therapeutic activities;Therapeutic exercise;Balance training;Neuromuscular re-education;Patient/family education;Wheelchair mobility training;Manual techniques;Energy conservation;Electrical Stimulation;Cryotherapy;Moist Heat;Orthotic Fit/Training;Passive range of motion   PT Next Visit Plan Continue with current PT POC;  Progress core strengthening, functional mobiltiy in bed, postural training and gait training.   PT Home Exercise Plan Continue with previous HEP   Consulted and Agree with Plan of Care Patient          G-Codes - 2015-12-07 1530    Functional Assessment Tool Used FOTO  limited however based on skilled clinical assessment of gait, balance, mobility, safety, fall risk estimate limitation to be about 75%   Functional Limitation Mobility: Walking and moving around      Problem List There are no active problems to display for this patient.    3:31 PM,12-07-15 Elly Modena PT, DPT [facility] [phone]  Deniece Ree PT, DPT [phone]   [place] [address] [place], [place], [address] Phone: [phone]   Fax:  [phone]  Name: VANNIE HOCHSTETLER MRN: [id number] Date of Birth: Jul 31, 1971

## 2015-12-02 NOTE — Patient Instructions (Signed)
Wrist Strengthening Exercises  1) WRIST EXTENSION CURLS - TABLE  Hold a small free weight, rest your forearm on a table and bend your wrist up and down with your palm face down as shown.      2) WRIST FLEXION CURLS - TABLE  Hold a small free weight, rest your forearm on a table and bend your wrist up and down with your palm face up as shown.     3) FREE WEIGHT RADIAL/ULNAR DEVIATION - TABLE  Hold a small free weight, rest your forearm on a table and bend your wrist up and down with your palm facing towards the side as shown.     4) Pronation  Forearm supported on table with wrist in neutral position. Using a weight, roll wrist so that palm faces downward. Hold for 2 seconds and return to starting position.     5) Supination  Forearm supported on table with wrist in neutral position. Using a weight, roll wrist so that palm is now facing upward. Hold for 2 seconds and return to starting position.      *Complete exercises using __2__ pound weight, _15___times each, __1-2__times per day*

## 2015-12-04 ENCOUNTER — Ambulatory Visit (HOSPITAL_COMMUNITY): Payer: Medicare Other | Admitting: Occupational Therapy

## 2015-12-04 ENCOUNTER — Ambulatory Visit (HOSPITAL_COMMUNITY): Payer: Medicare Other | Admitting: Physical Therapy

## 2015-12-04 ENCOUNTER — Encounter (HOSPITAL_COMMUNITY): Payer: Self-pay | Admitting: Occupational Therapy

## 2015-12-04 DIAGNOSIS — M25659 Stiffness of unspecified hip, not elsewhere classified: Secondary | ICD-10-CM

## 2015-12-04 DIAGNOSIS — G35 Multiple sclerosis: Secondary | ICD-10-CM

## 2015-12-04 DIAGNOSIS — R29898 Other symptoms and signs involving the musculoskeletal system: Secondary | ICD-10-CM

## 2015-12-04 DIAGNOSIS — R269 Unspecified abnormalities of gait and mobility: Secondary | ICD-10-CM

## 2015-12-04 DIAGNOSIS — R293 Abnormal posture: Secondary | ICD-10-CM

## 2015-12-04 DIAGNOSIS — R278 Other lack of coordination: Secondary | ICD-10-CM

## 2015-12-04 DIAGNOSIS — R198 Other specified symptoms and signs involving the digestive system and abdomen: Secondary | ICD-10-CM

## 2015-12-04 NOTE — Therapy (Signed)
[facility] [address] [place], [place], [address] Phone: [phone]   Fax:  [phone]  Physical Therapy Treatment  Patient Details  Name: Vincent Anthony MRN: [id number] Date of Birth: 01/28/1971 Referring Provider: Dr. Harlen Labs   Encounter Date: 12/04/2015      PT End of Session - 12/04/15 1616    Visit Number 8   Number of Visits 20   Authorization Type Medicare (G-codes done 7th visit)   Authorization Time Period 10/23/15 to 12/21/15   Authorization - Visit Number 8   Authorization - Number of Visits 17   PT Start Time 1518   PT Stop Time 1604   PT Time Calculation (min) 46 min   Equipment Utilized During Treatment Gait belt   Activity Tolerance Patient limited by fatigue   Behavior During Therapy [facility] for tasks assessed/performed      Past Medical History  Diagnosis Date  . MS (multiple sclerosis) (HCC)   . Headache   . Tremor     Past Surgical History  Procedure Laterality Date  . Knee surgery    . Hernia repair    . Testicle removal      There were no vitals filed for this visit.  Visit Diagnosis:  Weakness of left hip  Weakness of right lower extremity  Poor posture  Abnormal gait  Multiple sclerosis (HCC)  Abdominal weakness  Coordination abnormal  Hip stiffness, unspecified laterality      Subjective Assessment - 12/04/15 1528    Subjective Pt states he has pain occasionally in his Rt leg when it goes into spasms.   PT states that he is doing his home exercise program.     Currently in Pain? Yes   Pain Score 6    Pain Location Hip   Pain Orientation Right   Pain Descriptors / Indicators Dull   Pain Type Chronic pain                [facility] Adult PT Treatment/Exercise - 12/04/15 1548    Ambulation/Gait   Ambulation/Gait Yes   Ambulation/Gait Assistance 5: Supervision   Ambulation Distance (Feet) 80 Feet  x1; 50 x 5   Assistive device Rolling walker   Gait Pattern Decreased  stride length   Gait velocity slow   Gait Comments verbal cuing to hip hike prior to advancement of Rt LE    Elbow Exercises   Wrist Flexion --   Bar Weights/Barbell (Wrist Flexion) --   Wrist Extension --   Bar Weights/Barbell (Wrist Extension) --   Knee/Hip Exercises: Standing   Other Standing Knee Exercises side step at mat x 1 RT   Other Standing Knee Exercises standing with walker hip hike Rt LE; advance Rt LE x 10; abduction x 10  each (pillowcase over foot to make this task easier)   Knee/Hip Exercises: Seated   Ball Squeeze x10    Marching Limitations x10 (therapist thigh under pt to assist with flexion and resist with extension)   Sit to Sand 10 reps   Wrist Exercises   Wrist Radial Deviation    Bar Weights/Barbell (Radial Deviation)    Wrist Ulnar Deviation    Bar Weights/Barbell (Ulnar Deviation)              Balance Exercises - 12/04/15 1616    Balance Exercises: Standing   Standing Eyes Opened Narrow base of support (BOS);2 reps   Tandem Stance Eyes open;2 reps  PT Education - 12/04/15 1615    Education provided Yes   Education Details hip hike priot to advancement of RT LE    Person(s) Educated Patient   Methods Explanation;Demonstration   Comprehension Verbalized understanding;Returned demonstration          PT Short Term Goals - 12/02/15 1422    PT SHORT TERM GOAL #1   Title Patient to be able to perform rolling from supine to either side with correct sequencing and only min guard in order to increase independence in mobility at home    Time 4   Period Weeks   Status On-going   PT SHORT TERM GOAL #2   Title Patient to be able to perform sit to stand and stand to sit from chairs of various heights with correct sequencing and minimal unsteadiness in order to enhance safety and independence in mobility    Time 4   Period Weeks   Status On-going   PT SHORT TERM GOAL #3   Title Patient to demonstrate improved gait mechanics including  reduced toe drag/scuff R LE, improved stance/swing times both LEs, improved posture during gait, improved gait speed, and safe use of AD,  with cues no mroe than 25% of the time in order to improve efficiency of mobility and safety in gait    Time 4   Period Weeks   Status On-going   PT SHORT TERM GOAL #4   Title Patient to be independend in wheelchair management and navigation in order to enhance mobility and independence in community    Time 4   Period Weeks   Status On-going   PT SHORT TERM GOAL #5   Title Patient to be consistent in correctly performing approprate HEP, to be updated PRN    Time 4   Period Weeks   Status On-going           PT Long Term Goals - 12/02/15 1425    PT LONG TERM GOAL #1   Title Patient to demonstrate strength 4-/5 in all tested muscle groups, especally hip extensors and abductors, in order to enhance mobility skills and reduce fall risk    Time 8   Period Weeks   Status On-going   PT LONG TERM GOAL #2   Title Patient to be able to perform all bed mobility and functional transfers with mod(I) and rolling walker in order to enhance independence in mobilty at home with minimal fall risk    Time 8   Period Weeks   Status On-going   PT LONG TERM GOAL #3   Title Patient to report no falls within the past 4 weeks in order to demosntrate improved safety at home and minimal risk of injury with mobility    Baseline 12/02/15* fall one week ago    Time 8   Period Weeks   Status On-going   PT LONG TERM GOAL #4   Title Patient to be able to perform TUG test in 20 seconds or less with rolling walker in order to demonstrate improved general mobility and balance    Time 8   Period Weeks   Status On-going   PT LONG TERM GOAL #5   Title Patient to maintain correct posture 80% of the time when sitting on all surfaces in order to demonstrate improved core strength and improved body awareness for mobilty    Time 8   Period Weeks   Status On-going  Plan - 12/04/15 1617    Clinical Impression Statement Concentrated on standing activities to improve standing tolerance.  All exercises were completed in front ot the mat for safety.  Pt needs verbal cuing to keep core tight and shoulders within base of support during balance activities.    Pt will benefit from skilled therapeutic intervention in order to improve on the following deficits Abnormal gait;Decreased endurance;Hypomobility;Decreased activity tolerance;Decreased strength;Decreased balance;Decreased mobility;Difficulty walking;Improper body mechanics;Decreased coordination;Decreased safety awareness;Postural dysfunction;Decreased range of motion;Pain   PT Next Visit Plan Continue with current PT POC;  Progress core strengthening, functional mobiltiy in bed, postural training and gait training.        Problem List There are no active problems to display for this patient.   Virgina Organ, PT CLT [phone] 12/04/2015, 4:20 PM  [facility] [address] [place], [place], [address] Phone: [phone]   Fax:  [phone]  Name: DELMAS FAUCETT MRN: [id number] Date of Birth: [birth year]

## 2015-12-04 NOTE — Therapy (Signed)
[place], [place], [address] Phone: [phone]   Fax:  [phone]  Occupational Therapy Reassessment & Treatment  Patient Details  Name: Vincent Anthony MRN: [id number] Date of Birth: 1971/01/12 Referring Provider: Dr. Ala Bent  Encounter Date: 12/04/2015      OT End of Session - 12/04/15 1530    Visit Number 5   Number of Visits 12   Date for OT Re-Evaluation 01/07/16   Authorization Type Medicare/Medicare A & B   Authorization Time Period Before 10th visit   Authorization - Visit Number 5   Authorization - Number of Visits 10   OT Start Time 0947   OT Stop Time 1515   OT Time Calculation (min) 41 min   Activity Tolerance Patient tolerated treatment well   Behavior During Therapy [facility] for tasks assessed/performed      Past Medical History  Diagnosis Date  . MS (multiple sclerosis) ([place])   . Headache   . Tremor     Past Surgical History  Procedure Laterality Date  . Knee surgery    . Hernia repair    . Testicle removal      There were no vitals filed for this visit.  Visit Diagnosis:  Decreased grip strength of right hand  Decreased pinch strength  Weakness of right upper extremity      Subjective Assessment - 12/04/15 1438    Subjective  S: I've just got the normal pain today.    Currently in Pain? Yes   Pain Score 4    Pain Location Knee   Pain Orientation Right;Left   Pain Descriptors / Indicators Dull;Tingling   Pain Type Chronic pain   Pain Radiating Towards behind knees   Pain Onset More than a month ago   Pain Frequency Constant   Aggravating Factors  prolonged standing   Pain Relieving Factors laying down, elevating legs   Effect of Pain on Daily Activities limited tolerance for standing during daily tasks.    Multiple Pain Sites No           [facility] OT Assessment - 12/04/15 1438    Assessment   Diagnosis multiple sclerosis   Precautions   Precautions Fall   Coordination   Right 9 Hole Peg Test 3 pegs placed in 5'  previous 2'04"   Strength   Strength Assessment Site Shoulder;Elbow;Wrist;Hand   Right/Left Shoulder Right   Right Shoulder Flexion 4+/5  same as previous   Right Shoulder ABduction 4+/5  same as previous   Right Shoulder Internal Rotation 4/5  same as previous   Right Shoulder External Rotation 4/5  same as previous   Right/Left Elbow Right   Right Elbow Flexion 4+/5  same as previous   Right Elbow Extension 4-/5  same as previous   Right/Left Wrist Right   Right Wrist Flexion 4-/5  same as previous   Right Wrist Extension 4-/5  same as previous   Right Wrist Radial Deviation 4-/5  same as previous   Right Wrist Ulnar Deviation 4-/5  same as previous   Right/Left hand Right   Right Hand Grip (lbs) 48  45 previous   Right Hand Lateral Pinch 16 lbs  same as previous                  OT Treatments/Exercises (OP) - 12/04/15 1521    Exercises   Exercises Hand;Wrist   Wrist Exercises   Wrist Flexion Strengthening;15 reps  Bar Weights/Barbell (Wrist Flexion) 2 lbs   Wrist Extension Strengthening;15 reps   Bar Weights/Barbell (Wrist Extension) 2 lbs   Wrist Radial Deviation Strengthening;15 reps   Bar Weights/Barbell (Radial Deviation) 2 lbs   Wrist Ulnar Deviation Strengthening;15 reps   Bar Weights/Barbell (Ulnar Deviation) 2 lbs   Functional Reaching Activities   High Level Utilizing slant board, pt placed 9 large pegs into pegboard, focusing on reaching with shoulder and fine motor coordination. Pt with mod difficulty reaching in flexion, using abduction to compensate. Pt required min assist with placing 2/9 pegs. Increased time required for task                 OT Short Term Goals - 11/22/15 1243    OT SHORT TERM GOAL #1   Title Pt will be educated on and independent in HEP.    Time 6   Period Weeks   Status On-going   OT SHORT TERM GOAL #2   Title Pt will increase right grip  strength by 10# to improve ability to grasp and hold grooming objects during daily routine.    Time 6   Period Weeks   Status On-going   OT SHORT TERM GOAL #3   Title Pt will increase right pinch strength by 3# to increase ability to grasp writing and eating utensils.     Time 6   Period Weeks   Status On-going   OT SHORT TERM GOAL #4   Title Pt will increase right wrist strength to 4+/5 to increase ability to use RUE for support when completing daily and work tasks.    Time 6   Period Weeks   Status On-going   OT SHORT TERM GOAL #5   Title Pt will demonstrate independence in operating manual wheelchair and manuevering around obstacles.    Time 6   Period Weeks   Status On-going   OT SHORT TERM GOAL #6   Title Pt will demonstrate independence in loading and unloading wheelchair from car to increase ability to complete community mobility tasks.     Time 6   Period Weeks   Status On-going   OT SHORT TERM GOAL #7   Title Pt will increase knowledge of compensatory strategies for ADL completion by verbally relaying current and newly learned strategies to OT.    Time 6   Period Weeks   Status On-going                  Plan - 12/04/15 1530    Clinical Impression Statement A: Mini-reassessment completed this session, although pt has only completed 4 appointments prior to today. Pt has not met any goals and is making slow progress towards goals. Pt has been completing HEP and has purchased items to practice fine motor coordination tasks at home as well as during therapy sessions.    Plan P: Theraputty task-using PVC pipe to cut out circles.         Problem List There are no active problems to display for this patient.   Guadelupe Sabin, OTR/L  [phone]  12/04/2015, 3:34 PM  [place] [address] [place], [place], [address] Phone: [phone]   Fax:  [phone]  Name: Vincent Anthony MRN: [id number] Date of  Birth: [birth year]

## 2015-12-06 ENCOUNTER — Ambulatory Visit (HOSPITAL_COMMUNITY): Payer: Medicare Other

## 2015-12-09 ENCOUNTER — Ambulatory Visit (HOSPITAL_COMMUNITY): Payer: Medicare Other | Admitting: Physical Therapy

## 2015-12-09 DIAGNOSIS — M6281 Muscle weakness (generalized): Secondary | ICD-10-CM

## 2015-12-09 DIAGNOSIS — R29898 Other symptoms and signs involving the musculoskeletal system: Secondary | ICD-10-CM

## 2015-12-09 DIAGNOSIS — M25659 Stiffness of unspecified hip, not elsewhere classified: Secondary | ICD-10-CM

## 2015-12-09 DIAGNOSIS — Z9181 History of falling: Secondary | ICD-10-CM

## 2015-12-09 DIAGNOSIS — Z789 Other specified health status: Secondary | ICD-10-CM

## 2015-12-09 DIAGNOSIS — R278 Other lack of coordination: Secondary | ICD-10-CM

## 2015-12-09 DIAGNOSIS — R269 Unspecified abnormalities of gait and mobility: Secondary | ICD-10-CM

## 2015-12-09 DIAGNOSIS — G35 Multiple sclerosis: Secondary | ICD-10-CM

## 2015-12-09 DIAGNOSIS — R198 Other specified symptoms and signs involving the digestive system and abdomen: Secondary | ICD-10-CM

## 2015-12-09 DIAGNOSIS — R293 Abnormal posture: Secondary | ICD-10-CM

## 2015-12-09 NOTE — Therapy (Signed)
[facility] [address] [place], [place], [address] Phone: [phone]   Fax:  [phone]  Physical Therapy Treatment  Patient Details  Name: Vincent Anthony MRN: [id number] Date of Birth: 02-12-1971 Referring Provider: Dr. Harlen Labs   Encounter Date: 12/09/2015      PT End of Session - 12/09/15 1451    Visit Number 9   Number of Visits 20   Date for PT Re-Evaluation 12/30/15   Authorization Type Medicare (G-codes done 7th visit)   Authorization Time Period 10/23/15 to 12/21/15   Authorization - Visit Number 9   Authorization - Number of Visits 17   PT Start Time 1302   PT Stop Time 1347   PT Time Calculation (min) 45 min   Equipment Utilized During Treatment Gait belt   Activity Tolerance Patient tolerated treatment well      Past Medical History  Diagnosis Date  . MS (multiple sclerosis) ([facility])   . Headache   . Tremor     Past Surgical History  Procedure Laterality Date  . Knee surgery    . Hernia repair    . Testicle removal      There were no vitals filed for this visit.  Visit Diagnosis:  Abdominal weakness  Coordination abnormal  Hip stiffness, unspecified laterality  Muscle weakness  Decreased activities of daily living (ADL)  Multiple sclerosis ([facility])  Abnormal gait  Poor posture  Weakness of right lower extremity      Subjective Assessment - 12/09/15 1313    Subjective Pt states that he doing his exercies at home.     Currently in Pain? Yes   Pain Score 4    Pain Location Hip   Pain Orientation Right;Left   Pain Type Chronic pain   Pain Onset More than a month ago   Pain Frequency Constant   Aggravating Factors  walking   Pain Relieving Factors resting    Multiple Pain Sites Yes   Pain Score 4   Pain Location Knee   Pain Orientation Right;Left   Pain Descriptors / Indicators Aching   Pain Type Chronic pain   Pain Onset More than a month ago   Pain Frequency Constant    Aggravating Factors  weightbearing    Pain Relieving Factors Sitting                          [facility] Adult PT Treatment/Exercise - 12/09/15 0001    Ambulation/Gait   Ambulation/Gait Yes   Ambulation/Gait Assistance 5: Supervision   Ambulation Distance (Feet) 80 Feet  x1; 50 x 5   Assistive device Rolling walker   Gait Pattern Decreased stride length   Gait velocity slow   Gait Comments verbal cuing to hip hike prior to advancement of Rt LE    Knee/Hip Exercises: Standing   Functional Squat 10 reps   Other Standing Knee Exercises side step at mat x 1 RT   Other Standing Knee Exercises standing;  advance  LE x 10 each    Knee/Hip Exercises: Seated   Ball Squeeze x20    Marching Limitations x10   Sit to Sand 10 reps   Wrist Exercises   Wrist Radial Deviation Strengthening;15 reps   Bar Weights/Barbell (Radial Deviation) 2 lbs   Wrist Ulnar Deviation Strengthening;15 reps   Bar Weights/Barbell (Ulnar Deviation) 2 lbs             Balance Exercises - 12/09/15 1342  Balance Exercises: Standing   Standing Eyes Opened Narrow base of support (BOS)   Tandem Stance Eyes open;2 reps             PT Short Term Goals - 12/02/15 1422    PT SHORT TERM GOAL #1   Title Patient to be able to perform rolling from supine to either side with correct sequencing and only min guard in order to increase independence in mobility at home    Time 4   Period Weeks   Status On-going   PT SHORT TERM GOAL #2   Title Patient to be able to perform sit to stand and stand to sit from chairs of various heights with correct sequencing and minimal unsteadiness in order to enhance safety and independence in mobility    Time 4   Period Weeks   Status On-going   PT SHORT TERM GOAL #3   Title Patient to demonstrate improved gait mechanics including reduced toe drag/scuff R LE, improved stance/swing times both LEs, improved posture during gait, improved gait speed, and safe use of AD,   with cues no mroe than 25% of the time in order to improve efficiency of mobility and safety in gait    Time 4   Period Weeks   Status On-going   PT SHORT TERM GOAL #4   Title Patient to be independend in wheelchair management and navigation in order to enhance mobility and independence in community    Time 4   Period Weeks   Status On-going   PT SHORT TERM GOAL #5   Title Patient to be consistent in correctly performing approprate HEP, to be updated PRN    Time 4   Period Weeks   Status On-going           PT Long Term Goals - 12/02/15 1425    PT LONG TERM GOAL #1   Title Patient to demonstrate strength 4-/5 in all tested muscle groups, especally hip extensors and abductors, in order to enhance mobility skills and reduce fall risk    Time 8   Period Weeks   Status On-going   PT LONG TERM GOAL #2   Title Patient to be able to perform all bed mobility and functional transfers with mod(I) and rolling walker in order to enhance independence in mobilty at home with minimal fall risk    Time 8   Period Weeks   Status On-going   PT LONG TERM GOAL #3   Title Patient to report no falls within the past 4 weeks in order to demosntrate improved safety at home and minimal risk of injury with mobility    Baseline 12/02/15* fall one week ago    Time 8   Period Weeks   Status On-going   PT LONG TERM GOAL #4   Title Patient to be able to perform TUG test in 20 seconds or less with rolling walker in order to demonstrate improved general mobility and balance    Time 8   Period Weeks   Status On-going   PT LONG TERM GOAL #5   Title Patient to maintain correct posture 80% of the time when sitting on all surfaces in order to demonstrate improved core strength and improved body awareness for mobilty    Time 8   Period Weeks   Status On-going               Plan - 12/09/15 1453    Clinical Impression Statement Worked on gait training  as pt was advancing walker than left leg then walker  then right.  Pt gait trained with proper sequence and verbal cuing to keep proper sequencing.  Pt able to advance Rt LE with less difficulty today.   Focused on balance and strengthening with minimal assiist needed from therapist for safety.    Pt will benefit from skilled therapeutic intervention in order to improve on the following deficits Abnormal gait;Decreased endurance;Hypomobility;Decreased activity tolerance;Decreased strength;Decreased balance;Decreased mobility;Difficulty walking;Improper body mechanics;Decreased coordination;Decreased safety awareness;Postural dysfunction;Decreased range of motion;Pain   Rehab Potential Good   Clinical Impairments Affecting Rehab Potential ongoing MS and complications thereof    PT Frequency 2x / week   PT Treatment/Interventions ADLs/Self Care Home Management;DME Instruction;Gait training;Stair training;Functional mobility training;Therapeutic activities;Therapeutic exercise;Balance training;Neuromuscular re-education;Patient/family education;Wheelchair mobility training;Manual techniques;Energy conservation;Electrical Stimulation;Cryotherapy;Moist Heat;Orthotic Fit/Training;Passive range of motion   PT Next Visit Plan Continue with gati training if pt is not demonstrating proper sequencing.  Balance with one foot on 2" step.         Problem List There are no active problems to display for this patient.  Virgina Organ, PT CLT [phone] 12/09/2015, 2:57 PM  [place] [facility] [address] [place], [place], [address] Phone: [phone]   Fax:  [phone]  Name: Vincent Anthony MRN: [id number] Date of Birth: [birth year]

## 2015-12-11 ENCOUNTER — Encounter (HOSPITAL_COMMUNITY): Payer: Medicare Other | Admitting: Specialist

## 2015-12-11 ENCOUNTER — Ambulatory Visit (HOSPITAL_COMMUNITY): Payer: Medicare Other | Admitting: Physical Therapy

## 2015-12-11 ENCOUNTER — Encounter (HOSPITAL_COMMUNITY): Payer: Medicare Other | Admitting: Occupational Therapy

## 2015-12-13 ENCOUNTER — Ambulatory Visit (HOSPITAL_COMMUNITY): Payer: Medicare Other

## 2015-12-13 ENCOUNTER — Ambulatory Visit (HOSPITAL_COMMUNITY): Payer: Medicare Other | Admitting: Physical Therapy

## 2015-12-13 ENCOUNTER — Encounter (HOSPITAL_COMMUNITY): Payer: Medicare Other | Admitting: Occupational Therapy

## 2015-12-13 ENCOUNTER — Encounter (HOSPITAL_COMMUNITY): Payer: Self-pay

## 2015-12-13 DIAGNOSIS — R269 Unspecified abnormalities of gait and mobility: Secondary | ICD-10-CM

## 2015-12-13 DIAGNOSIS — R29898 Other symptoms and signs involving the musculoskeletal system: Secondary | ICD-10-CM

## 2015-12-13 DIAGNOSIS — Z789 Other specified health status: Secondary | ICD-10-CM

## 2015-12-13 DIAGNOSIS — M25659 Stiffness of unspecified hip, not elsewhere classified: Secondary | ICD-10-CM

## 2015-12-13 DIAGNOSIS — R198 Other specified symptoms and signs involving the digestive system and abdomen: Secondary | ICD-10-CM

## 2015-12-13 DIAGNOSIS — G35 Multiple sclerosis: Secondary | ICD-10-CM

## 2015-12-13 DIAGNOSIS — M6281 Muscle weakness (generalized): Secondary | ICD-10-CM

## 2015-12-13 DIAGNOSIS — R278 Other lack of coordination: Secondary | ICD-10-CM

## 2015-12-13 DIAGNOSIS — R293 Abnormal posture: Secondary | ICD-10-CM

## 2015-12-13 NOTE — Therapy (Signed)
[place] [facility] [address] [place], [place], [address] Phone: [phone]   Fax:  [phone]  Occupational Therapy Treatment  Patient Details  Name: Vincent Anthony MRN: [id number] Date of Birth: 1970-11-21 Referring Provider: Dr. Harlen Labs  Encounter Date: 12/13/2015      OT End of Session - 12/13/15 1306    Visit Number 6   Number of Visits 12   Date for OT Re-Evaluation 01/07/16   Authorization Type Medicare/Medicare A & B   Authorization Time Period Before 10th visit   Authorization - Visit Number 6   Authorization - Number of Visits 10   OT Start Time 1025   OT Stop Time 1100   OT Time Calculation (min) 35 min   Activity Tolerance Patient tolerated treatment well   Behavior During Therapy [facility] for tasks assessed/performed      Past Medical History  Diagnosis Date  . MS (multiple sclerosis) ([facility])   . Headache   . Tremor     Past Surgical History  Procedure Laterality Date  . Knee surgery    . Hernia repair    . Testicle removal      There were no vitals filed for this visit.  Visit Diagnosis:  Decreased grip strength of right hand  Weakness of right upper extremity      Subjective Assessment - 12/13/15 1303    Subjective  S: This just isn't working any more. (during handgripper task)   Currently in Pain? Yes   Pain Score 3    Pain Location Hip  knee   Pain Orientation Right;Left   Pain Descriptors / Indicators Aching   Pain Type Chronic pain   Pain Radiating Towards N/A   Pain Onset More than a month ago   Pain Frequency Constant   Aggravating Factors  walking   Pain Relieving Factors Resting   Effect of Pain on Daily Activities limited tolerance for standing during daily tasks.    Multiple Pain Sites No            [facility] OT Assessment - 12/13/15 1306    Assessment   Diagnosis multiple sclerosis   Precautions   Precautions Fall                  OT Treatments/Exercises (OP) -  12/13/15 1026    Exercises   Exercises Hand;Wrist   Additional Wrist Exercises   Theraputty Flatten  used PVC pipe to cut circles in putty   Theraputty - Flatten red-using heel of hand   Hand Gripper with Large Beads 1/6 with gripper at 20#, 1/6 with gripper at 11#   Hand Gripper with Medium Beads 4/13 with gripper at 20#                  OT Short Term Goals - 11/22/15 1243    OT SHORT TERM GOAL #1   Title Pt will be educated on and independent in HEP.    Time 6   Period Weeks   Status On-going   OT SHORT TERM GOAL #2   Title Pt will increase right grip strength by 10# to improve ability to grasp and hold grooming objects during daily routine.    Time 6   Period Weeks   Status On-going   OT SHORT TERM GOAL #3   Title Pt will increase right pinch strength by 3# to increase ability to grasp writing and eating utensils.     Time 6  Period Weeks   Status On-going   OT SHORT TERM GOAL #4   Title Pt will increase right wrist strength to 4+/5 to increase ability to use RUE for support when completing daily and work tasks.    Time 6   Period Weeks   Status On-going   OT SHORT TERM GOAL #5   Title Pt will demonstrate independence in operating manual wheelchair and manuevering around obstacles.    Time 6   Period Weeks   Status On-going   OT SHORT TERM GOAL #6   Title Pt will demonstrate independence in loading and unloading wheelchair from car to increase ability to complete community mobility tasks.     Time 6   Period Weeks   Status On-going   OT SHORT TERM GOAL #7   Title Pt will increase knowledge of compensatory strategies for ADL completion by verbally relaying current and newly learned strategies to OT.    Time 6   Period Weeks   Status On-going                  Plan - 12/13/15 1307    Clinical Impression Statement A: Completed theraputty task with PVC pipe to increase grip and RUE strength. patient completed with increased time. No reports of  pain. Patient did have increased difficulty with gripper task. Increased difficulty seen with both large and medium beads. Resistance was reduced without affect.    Plan P: Continue with wrist and pinch strengthening exercises. Discuss compensatory techniques for ADL tasks if education has not been completed yet.        Problem List There are no active problems to display for this patient.   Limmie Patricia, OTR/L,CBIS  [phone]  12/13/2015, 1:10 PM  [place] [facility] [address] [place], [place], [address] Phone: [phone]   Fax:  [phone]  Name: Vincent Anthony MRN: [id number] Date of Birth: 01/07/1971

## 2015-12-13 NOTE — Therapy (Signed)
[place], [place], [address] Phone: [phone]   Fax:  [phone]  Physical Therapy Treatment  Patient Details  Name: Vincent Anthony MRN: [id number] Date of Birth: 1971-06-20 Referring Provider: Dr. Ala Bent   Encounter Date: 12/13/2015      PT End of Session - 12/13/15 1121    Visit Number 10   Number of Visits 20   Date for PT Re-Evaluation 12/30/15   Authorization Type Medicare (G-codes done 7th visit)   Authorization Time Period 10/23/15 to 12/21/15   Authorization - Visit Number 10   Authorization - Number of Visits 17      Past Medical History  Diagnosis Date  . MS (multiple sclerosis) ([place])   . Headache   . Tremor     Past Surgical History  Procedure Laterality Date  . Knee surgery    . Hernia repair    . Testicle removal      There were no vitals filed for this visit.  Visit Diagnosis:  Abdominal weakness  Coordination abnormal  Hip stiffness, unspecified laterality  Muscle weakness  Decreased activities of daily living (ADL)  Multiple sclerosis (HCC)  Abnormal gait  Poor posture  Weakness of right lower extremity      Subjective Assessment - 12/13/15 1118    Subjective Pt is walking in his hallway about 3 times a day.     Pertinent History Patient reports that he has had more problems with his balance and has really gotten weak in his core; has had to walk with a walker recently. Has also gotten an AFO to help keep his R knee from hyperextending. Patient reports he does not feel like it is not working the way he wants it to; in process of tracking down orthotist to get another adjustment to his brace.    Patient Stated Goals be able to transfer with more ease, walk better, better balance    Currently in Pain? Yes   Pain Score 3    Pain Location Hip   Pain Orientation Right;Left   Pain Descriptors / Indicators Aching   Pain Type Chronic pain   Pain Onset More than  a month ago   Pain Frequency Constant   Aggravating Factors  walking    Pain Relieving Factors resting             [facility] PT Assessment - 12/13/15 1120    Functional Tests   Functional tests Sit to Stand   Sit to Stand   Comments unable to come sit to stand withiout moderate assist    Strength   Right Hip Flexion 2-/5  ws 2/5    Right Hip Extension 2/5   Right Hip ABduction 2-/5   Right Hip ADduction 2+/5   Left Hip Flexion 2/5  was 2/5    Left Hip Extension 2+/5   Left Hip ABduction 2+/5  was 2/5    Left Hip ADduction 2+/5   Right Knee Extension 4-/5  was 3+/5   Left Knee Extension 5/5  was 4+/5    Left Ankle Dorsiflexion 5/5  was 4+/5   Bed Mobility   Rolling Right 6: Modified independent (Device/Increase time)  was min assist    Rolling Left 6: Modified independent (Device/Increase time)   Supine to Sit 4: Min guard  was moderate assist    Sit to Supine 4: Min assist  was moderate assist    Timed Up and Go Test  TUG Normal TUG   Normal TUG (seconds) --  2:01                      [facility] Adult PT Treatment/Exercise - 12/13/15 1120    Ambulation/Gait   Ambulation/Gait Yes   Ambulation/Gait Assistance 5: Supervision   Ambulation Distance (Feet) 216 Feet  x1; 50 x 5   Assistive device Rolling walker   Gait Pattern Decreased stride length   Gait velocity slow   Gait Comments verbal cuing to increase stride length    Knee/Hip Exercises: Standing   Functional Squat 10 reps   Other Standing Knee Exercises --   Other Standing Knee Exercises --   Knee/Hip Exercises: Seated   Ball Squeeze --   Marching Limitations --   Sit to Sand --   Knee/Hip Exercises: Supine   Heel Slides AAROM;Both;5 reps   Bridges Both;10 reps   Wrist Exercises   Wrist Radial Deviation Strengthening;15 reps   Bar Weights/Barbell (Radial Deviation) 2 lbs   Wrist Ulnar Deviation Strengthening;15 reps   Bar Weights/Barbell (Ulnar Deviation) 2 lbs                   PT Short Term Goals - 12/13/15 1141    PT SHORT TERM GOAL #1   Title Patient to be able to perform rolling from supine to either side with correct sequencing and only min guard in order to increase independence in mobility at home    Time 4   Period Weeks   Status Achieved   PT SHORT TERM GOAL #2   Title Patient to be able to perform sit to stand and stand to sit from chairs of various heights with correct sequencing and minimal unsteadiness in order to enhance safety and independence in mobility    Time 4   Period Weeks   Status On-going   PT SHORT TERM GOAL #3   Title Patient to demonstrate improved gait mechanics including reduced toe drag/scuff R LE, improved stance/swing times both LEs, improved posture during gait, improved gait speed, and safe use of AD,  with cues no mroe than 25% of the time in order to improve efficiency of mobility and safety in gait    Time 4   Period Weeks   Status Achieved   PT SHORT TERM GOAL #4   Title Patient to be independend in wheelchair management and navigation in order to enhance mobility and independence in community    Baseline Pt is not using the wheelchair due to limted space    Time 4   Status Not Met   PT SHORT TERM GOAL #5   Title Patient to be consistent in correctly performing approprate HEP, to be updated PRN    Time 4   Period Weeks   Status On-going           PT Long Term Goals - 12/13/15 1202    PT LONG TERM GOAL #1   Title Patient to demonstrate strength 4-/5 in all tested muscle groups, especally hip extensors and abductors, in order to enhance mobility skills and reduce fall risk    Time 8   Period Weeks   Status On-going   PT LONG TERM GOAL #2   Title Patient to be able to perform all bed mobility and functional transfers with mod(I) and rolling walker in order to enhance independence in mobilty at home with minimal fall risk    Baseline 2/5   Time 8  Period Weeks   Status On-going   PT LONG TERM GOAL #3    Title Patient to report no falls within the past 4 weeks in order to demosntrate improved safety at home and minimal risk of injury with mobility    Baseline 12/02/15* fall one week ago    Time 8   Period Weeks   Status On-going   PT LONG TERM GOAL #4   Title Patient to be able to perform TUG test in 1:45 seconds or less with rolling walker in order to demonstrate improved general mobility and balance    Status Revised  from 20 seconds    PT LONG TERM GOAL #5   Title Patient to maintain correct posture 80% of the time when sitting on all surfaces in order to demonstrate improved core strength and improved body awareness for mobilty    Time 8   Period Weeks   Status On-going               Plan - 12/13/15 1205    Clinical Impression Statement Pt reassessed with improvement noted in gait, bed mobility and some mm strengths.  Pt will continue to benefit from skilled physical therapy to improve his balance and strength and continued instruction in proper sequencing with ambulation with walker to decrease his risk of falling. Goals reviewed and updated.    Pt will benefit from skilled therapeutic intervention in order to improve on the following deficits Abnormal gait;Decreased endurance;Hypomobility;Decreased activity tolerance;Decreased strength;Decreased balance;Decreased mobility;Difficulty walking;Improper body mechanics;Decreased coordination;Decreased safety awareness;Postural dysfunction;Decreased range of motion;Pain   Clinical Impairments Affecting Rehab Potential ongoing MS and complications thereof    PT Frequency 2x / week   PT Duration 6 weeks   PT Treatment/Interventions ADLs/Self Care Home Management;DME Instruction;Gait training;Stair training;Functional mobility training;Therapeutic activities;Therapeutic exercise;Balance training;Neuromuscular re-education;Patient/family education;Wheelchair mobility training;Manual techniques;Energy conservation;Electrical  Stimulation;Cryotherapy;Moist Heat;Orthotic Fit/Training;Passive range of motion   PT Next Visit Plan Continue with gati training if pt is not demonstrating proper sequencing.  Balance with one foot on 2" step. functional squat and sidestepping    PT Home Exercise Plan Continue with previous HEP        Problem List There are no active problems to display for this patient.   Rayetta Humphrey, PT CLT [phone] 12/13/2015, 12:08 PM  [facility] [address] [place], [place], [address] Phone: [phone]   Fax:  [phone]  Name: Vincent Anthony MRN: [id number] Date of Birth: August 27, 1971

## 2015-12-16 ENCOUNTER — Ambulatory Visit (HOSPITAL_COMMUNITY): Payer: Medicare Other | Admitting: Physical Therapy

## 2015-12-16 ENCOUNTER — Telehealth (HOSPITAL_COMMUNITY): Payer: Self-pay

## 2015-12-16 ENCOUNTER — Encounter (HOSPITAL_COMMUNITY): Payer: Medicare Other

## 2015-12-16 ENCOUNTER — Encounter (HOSPITAL_COMMUNITY): Payer: Medicare Other | Admitting: Specialist

## 2015-12-16 NOTE — Telephone Encounter (Signed)
Patient have to work and will not be abl to come until Friday

## 2015-12-18 ENCOUNTER — Encounter (HOSPITAL_COMMUNITY): Payer: Medicare Other

## 2015-12-18 ENCOUNTER — Ambulatory Visit (HOSPITAL_COMMUNITY): Payer: Medicare Other

## 2015-12-20 ENCOUNTER — Ambulatory Visit (HOSPITAL_COMMUNITY): Payer: Medicare Other | Admitting: Occupational Therapy

## 2015-12-20 ENCOUNTER — Ambulatory Visit (HOSPITAL_COMMUNITY): Payer: Medicare Other | Admitting: Physical Therapy

## 2015-12-20 ENCOUNTER — Ambulatory Visit (HOSPITAL_COMMUNITY): Payer: Medicare Other

## 2015-12-20 DIAGNOSIS — M25659 Stiffness of unspecified hip, not elsewhere classified: Secondary | ICD-10-CM | POA: Diagnosis not present

## 2015-12-20 DIAGNOSIS — G35 Multiple sclerosis: Secondary | ICD-10-CM

## 2015-12-20 DIAGNOSIS — R29898 Other symptoms and signs involving the musculoskeletal system: Secondary | ICD-10-CM

## 2015-12-20 DIAGNOSIS — M6281 Muscle weakness (generalized): Secondary | ICD-10-CM

## 2015-12-20 DIAGNOSIS — R269 Unspecified abnormalities of gait and mobility: Secondary | ICD-10-CM

## 2015-12-20 DIAGNOSIS — Z9181 History of falling: Secondary | ICD-10-CM

## 2015-12-20 DIAGNOSIS — R293 Abnormal posture: Secondary | ICD-10-CM

## 2015-12-20 DIAGNOSIS — R198 Other specified symptoms and signs involving the digestive system and abdomen: Secondary | ICD-10-CM

## 2015-12-20 DIAGNOSIS — R278 Other lack of coordination: Secondary | ICD-10-CM

## 2015-12-20 NOTE — Therapy (Signed)
[place], [place], [address] Phone: [phone]   Fax:  [phone]  Physical Therapy Treatment  Patient Details  Name: Vincent Anthony MRN: [id number] Date of Birth: [birth year] Referring Provider: Dr. Ala Bent   Encounter Date: 12/20/2015      PT End of Session - 12/20/15 1238    Visit Number 11   Number of Visits 20   Date for PT Re-Evaluation 12/30/15   Authorization - Visit Number 11   Authorization - Number of Visits 17   PT Start Time 5852   PT Stop Time 1148   PT Time Calculation (min) 43 min   Equipment Utilized During Treatment Gait belt   Activity Tolerance Patient tolerated treatment well   Behavior During Therapy [facility] for tasks assessed/performed      Past Medical History  Diagnosis Date  . MS (multiple sclerosis) ([place])   . Headache   . Tremor     Past Surgical History  Procedure Laterality Date  . Knee surgery    . Hernia repair    . Testicle removal      There were no vitals filed for this visit.  Visit Diagnosis:  Abdominal weakness  Coordination abnormal  Hip stiffness, unspecified laterality  Muscle weakness  Multiple sclerosis (HCC)  Abnormal gait  Poor posture  Weakness of right lower extremity  At risk for injury related to fall      Subjective Assessment - 12/20/15 1103    Subjective Pt is still walking  in his home still but is not doing many of his exercises    Currently in Pain? Yes   Pain Score 3    Pain Location Knee   Pain Orientation Right;Left   Pain Type Chronic pain                         [facility] Adult PT Treatment/Exercise - 12/20/15 0001    Knee/Hip Exercises: Standing   Functional Squat 10 reps   Other Standing Knee Exercises side step at mat x 1 RT; stand no UE support with head rotationl:; stand with one foot on 2" step bilateral x 30 seconds    Other Standing Knee Exercises standing;  advance  LE x 10 each    Knee/Hip Exercises: Seated   Long Arc Quad Strengthening;Both;10 reps;Weights   Long Arc Quad Weight 4 lbs.   Abduction/Adduction  Strengthening;Both;10 reps   Sit to [employer] 10 reps                  PT Short Term Goals - 12/13/15 1141    PT SHORT TERM GOAL #1   Title Patient to be able to perform rolling from supine to either side with correct sequencing and only min guard in order to increase independence in mobility at home    Time 4   Period Weeks   Status Achieved   PT SHORT TERM GOAL #2   Title Patient to be able to perform sit to stand and stand to sit from chairs of various heights with correct sequencing and minimal unsteadiness in order to enhance safety and independence in mobility    Time 4   Period Weeks   Status On-going   PT SHORT TERM GOAL #3   Title Patient to demonstrate improved gait mechanics including reduced toe drag/scuff R LE, improved stance/swing times both LEs, improved posture during gait, improved gait speed, and safe use of AD,  with cues no mroe than 25% of the time in order to improve efficiency of mobility and safety in gait    Time 4   Period Weeks   Status Achieved   PT SHORT TERM GOAL #4   Title Patient to be independend in wheelchair management and navigation in order to enhance mobility and independence in community    Baseline Pt is not using the wheelchair due to limted space    Time 4   Status Not Met   PT SHORT TERM GOAL #5   Title Patient to be consistent in correctly performing approprate HEP, to be updated PRN    Time 4   Period Weeks   Status On-going           PT Long Term Goals - 12/13/15 1202    PT LONG TERM GOAL #1   Title Patient to demonstrate strength 4-/5 in all tested muscle groups, especally hip extensors and abductors, in order to enhance mobility skills and reduce fall risk    Time 8   Period Weeks   Status On-going   PT LONG TERM GOAL #2   Title Patient to be able to perform all bed mobility and  functional transfers with mod(I) and rolling walker in order to enhance independence in mobilty at home with minimal fall risk    Baseline 2/5   Time 8   Period Weeks   Status On-going   PT LONG TERM GOAL #3   Title Patient to report no falls within the past 4 weeks in order to demosntrate improved safety at home and minimal risk of injury with mobility    Baseline 12/02/15* fall one week ago    Time 8   Period Weeks   Status On-going   PT LONG TERM GOAL #4   Title Patient to be able to perform TUG test in 1:45 seconds or less with rolling walker in order to demonstrate improved general mobility and balance    Status Revised  from 20 seconds    PT LONG TERM GOAL #5   Title Patient to maintain correct posture 80% of the time when sitting on all surfaces in order to demonstrate improved core strength and improved body awareness for mobilty    Time 8   Period Weeks   Status On-going               Plan - 12/20/15 1240    Clinical Impression Statement Mr. Kepner feels his gait has improved.  Therapist observed pt still drags Rt foot rather than hiking hip up.  Pt also tends to lean his weight forward to unweight legs to allow advancement of his leg rather than shifting weight from right to left therefore treatment today focuse on weight shifting with minimal to moderate assist from therapist for safety.    PT Next Visit Plan Continue with gati training if pt is not demonstrating proper sequencing.          Problem List There are no active problems to display for this patient.   Rayetta Humphrey, PT CLT [phone] 12/20/2015, 12:44 PM  [facility] [address] [place], [place], [address] Phone: [phone]   Fax:  [phone]  Name: Vincent Anthony MRN: [id number] Date of Birth: 1971/03/06

## 2015-12-23 ENCOUNTER — Encounter (HOSPITAL_COMMUNITY): Payer: Self-pay

## 2015-12-23 ENCOUNTER — Ambulatory Visit (HOSPITAL_COMMUNITY): Payer: Medicare Other

## 2015-12-23 ENCOUNTER — Ambulatory Visit (HOSPITAL_COMMUNITY): Payer: Medicare Other | Admitting: Physical Therapy

## 2015-12-23 DIAGNOSIS — G35 Multiple sclerosis: Secondary | ICD-10-CM

## 2015-12-23 DIAGNOSIS — M6281 Muscle weakness (generalized): Secondary | ICD-10-CM

## 2015-12-23 DIAGNOSIS — R278 Other lack of coordination: Secondary | ICD-10-CM

## 2015-12-23 DIAGNOSIS — R293 Abnormal posture: Secondary | ICD-10-CM

## 2015-12-23 DIAGNOSIS — R29898 Other symptoms and signs involving the musculoskeletal system: Secondary | ICD-10-CM

## 2015-12-23 DIAGNOSIS — R269 Unspecified abnormalities of gait and mobility: Secondary | ICD-10-CM

## 2015-12-23 DIAGNOSIS — M25659 Stiffness of unspecified hip, not elsewhere classified: Secondary | ICD-10-CM | POA: Diagnosis not present

## 2015-12-23 NOTE — Therapy (Signed)
[facility] [address] [place], [place], [address] Phone: [phone]   Fax:  [phone]  Occupational Therapy Treatment  Patient Details  Name: Vincent Anthony MRN: [id number] Date of Birth: [birth year] Referring Provider: Dr. Harlen Labs  Encounter Date: 12/23/2015      OT End of Session - 12/23/15 1148    Visit Number 7   Number of Visits 12   Date for OT Re-Evaluation 01/07/16   Authorization Type Medicare/Medicare A & B   Authorization Time Period Before 10th visit   Authorization - Visit Number 7   Authorization - Number of Visits 10   OT Start Time 1105   OT Stop Time 1145   OT Time Calculation (min) 40 min   Activity Tolerance Patient tolerated treatment well   Behavior During Therapy [facility] for tasks assessed/performed      Past Medical History  Diagnosis Date  . MS (multiple sclerosis) ([facility])   . Headache   . Tremor     Past Surgical History  Procedure Laterality Date  . Knee surgery    . Hernia repair    . Testicle removal      There were no vitals filed for this visit.  Visit Diagnosis:  Decreased pinch strength  Muscle weakness  Decreased grip strength of right hand  Weakness of right upper extremity      Subjective Assessment - 12/23/15 1113    Subjective  I feel stronger than last week.   Currently in Pain? Yes   Pain Score 3    Pain Location Knee   Pain Orientation Right;Left   Pain Descriptors / Indicators Aching   Pain Type Chronic pain   Pain Score 3   Pain Location Ankle   Pain Orientation Left;Right   Pain Descriptors / Indicators Aching   Pain Type Chronic pain   Pain Onset More than a month ago   Pain Frequency Constant            [facility] OT Assessment - 12/23/15 0001    Assessment   Diagnosis multiple sclerosis   Precautions   Precautions Fall                  OT Treatments/Exercises (OP) - 12/23/15 1116    Exercises   Exercises Hand;Wrist   Wrist  Exercises   Wrist Radial Deviation Strengthening;15 reps   Bar Weights/Barbell (Radial Deviation) 2 lbs   Wrist Ulnar Deviation Strengthening;15 reps   Bar Weights/Barbell (Ulnar Deviation) 2 lbs   Additional Wrist Exercises   Theraputty Flatten  also used PVC pipe to cut circles in putty   Theraputty - Flatten red-using heel of hand   Hand Gripper with Large Beads 6/6 with gripper at 15#   Hand Gripper with Medium Beads 6/6 with gripper at 15#   Hand Exercises   Other Hand Exercises Pt removed red resistive clothespins from container and clipped to pole with mod difficulty to pinch clips open.  Patient clipped green clothespins to top horizontal bar with assistance from L hand to place clips into R hand.                OT Education - 12/23/15 1104    Education provided Yes   Education Details Adaptive equipment and compensatory strategies for ADLs discussed this session. Patient reports how donning difficulty due to AFO's, lock laces, discussed bathing and patient states he bathes while seated and has grab bars. He has never fallen in the  shower.   Person(s) Educated Patient   Methods Explanation;Handout   Comprehension Verbalized understanding          OT Short Term Goals - 11/22/15 1243    OT SHORT TERM GOAL #1   Title Pt will be educated on and independent in HEP.    Time 6   Period Weeks   Status On-going   OT SHORT TERM GOAL #2   Title Pt will increase right grip strength by 10# to improve ability to grasp and hold grooming objects during daily routine.    Time 6   Period Weeks   Status On-going   OT SHORT TERM GOAL #3   Title Pt will increase right pinch strength by 3# to increase ability to grasp writing and eating utensils.     Time 6   Period Weeks   Status On-going   OT SHORT TERM GOAL #4   Title Pt will increase right wrist strength to 4+/5 to increase ability to use RUE for support when completing daily and work tasks.    Time 6   Period Weeks    Status On-going   OT SHORT TERM GOAL #5   Title Pt will demonstrate independence in operating manual wheelchair and manuevering around obstacles.    Time 6   Period Weeks   Status On-going   OT SHORT TERM GOAL #6   Title Pt will demonstrate independence in loading and unloading wheelchair from car to increase ability to complete community mobility tasks.     Time 6   Period Weeks   Status On-going   OT SHORT TERM GOAL #7   Title Pt will increase knowledge of compensatory strategies for ADL completion by verbally relaying current and newly learned strategies to OT.    Time 6   Period Weeks   Status On-going                  Plan - 12/23/15 1149    Clinical Impression Statement A: Patient demonstrated decreaed difficulty with gripper task this session with resistance set at 15 pounds. Increased to 2 sets of radial/ulnar deviation.   Plan P: Follow up on education provided about shoe laces. Increase weight for radial/ulnar deviation to 3lbs. Continue with exercises to strengthen wrist, grip, and pinch.        Problem List There are no active problems to display for this patient.   Limmie Patricia, OTR/L,CBIS  [phone]  12/23/2015, 12:36 PM  [facility] [address] [place], [place], [address] Phone: [phone]   Fax:  [phone]  Name: Vincent Anthony MRN: [id number] Date of Birth: 1970-10-01

## 2015-12-23 NOTE — Therapy (Signed)
[place], [place], [address] Phone: [phone]   Fax:  [phone]  Physical Therapy Treatment  Patient Details  Name: Vincent Anthony MRN: [id number] Date of Birth: January 03, 1971 Referring Provider: Dr. Ala Bent   Encounter Date: 12/23/2015      PT End of Session - 12/23/15 1058    Visit Number 12   Number of Visits 20   Date for PT Re-Evaluation 12/30/15   Authorization - Visit Number 12   Authorization - Number of Visits 17   PT Start Time 1010   PT Stop Time 1100   PT Time Calculation (min) 50 min   Equipment Utilized During Treatment Gait belt   Activity Tolerance Patient tolerated treatment well   Behavior During Therapy [facility] for tasks assessed/performed      Past Medical History  Diagnosis Date  . MS (multiple sclerosis) ([place])   . Headache   . Tremor     Past Surgical History  Procedure Laterality Date  . Knee surgery    . Hernia repair    . Testicle removal      There were no vitals filed for this visit.  Visit Diagnosis:  Coordination abnormal  Hip stiffness, unspecified laterality  Muscle weakness  Multiple sclerosis (HCC)  Abnormal gait  Poor posture  Weakness of right lower extremity      Subjective Assessment - 12/23/15 1034    Subjective Pt states his Rt ankle and knee are hurting 3/10.  States he has new pain into his Rt thenar eminence at night.  States he intends on letting his neurologist know.     Currently in Pain? Yes   Pain Score 3    Pain Location Knee   Pain Orientation Right                         [facility] Adult PT Treatment/Exercise - 12/23/15 1006    Knee/Hip Exercises: Standing   Gait Training with RW X 200 feet   Other Standing Knee Exercises side step at mat x 1 RT;stand with one foot on 2" step bilateral x 10   Knee/Hip Exercises: Seated   Long Arc Quad Strengthening;Both;10 reps;Weights   Long Arc Quad Weight 0 lbs.   Long  [employer] Limitations unable to complete on Rt LE today, Lt only without weight   Sit to [employer] 10 reps                  PT Short Term Goals - 12/13/15 1141    PT SHORT TERM GOAL #1   Title Patient to be able to perform rolling from supine to either side with correct sequencing and only min guard in order to increase independence in mobility at home    Time 4   Period Weeks   Status Achieved   PT SHORT TERM GOAL #2   Title Patient to be able to perform sit to stand and stand to sit from chairs of various heights with correct sequencing and minimal unsteadiness in order to enhance safety and independence in mobility    Time 4   Period Weeks   Status On-going   PT SHORT TERM GOAL #3   Title Patient to demonstrate improved gait mechanics including reduced toe drag/scuff R LE, improved stance/swing times both LEs, improved posture during gait, improved gait speed, and safe use of AD,  with cues no mroe than 25% of the time in  order to improve efficiency of mobility and safety in gait    Time 4   Period Weeks   Status Achieved   PT SHORT TERM GOAL #4   Title Patient to be independend in wheelchair management and navigation in order to enhance mobility and independence in community    Baseline Pt is not using the wheelchair due to limted space    Time 4   Status Not Met   PT SHORT TERM GOAL #5   Title Patient to be consistent in correctly performing approprate HEP, to be updated PRN    Time 4   Period Weeks   Status On-going           PT Long Term Goals - 12/13/15 1202    PT LONG TERM GOAL #1   Title Patient to demonstrate strength 4-/5 in all tested muscle groups, especally hip extensors and abductors, in order to enhance mobility skills and reduce fall risk    Time 8   Period Weeks   Status On-going   PT LONG TERM GOAL #2   Title Patient to be able to perform all bed mobility and functional transfers with mod(I) and rolling walker in order to enhance independence in  mobilty at home with minimal fall risk    Baseline 2/5   Time 8   Period Weeks   Status On-going   PT LONG TERM GOAL #3   Title Patient to report no falls within the past 4 weeks in order to demosntrate improved safety at home and minimal risk of injury with mobility    Baseline 12/02/15* fall one week ago    Time 8   Period Weeks   Status On-going   PT LONG TERM GOAL #4   Title Patient to be able to perform TUG test in 1:45 seconds or less with rolling walker in order to demonstrate improved general mobility and balance    Status Revised  from 20 seconds    PT LONG TERM GOAL #5   Title Patient to maintain correct posture 80% of the time when sitting on all surfaces in order to demonstrate improved core strength and improved body awareness for mobilty    Time 8   Period Weeks   Status On-going               Plan - 12/23/15 1109    Clinical Impression Statement PT with improved awareness of lifting Rt LE with ambulation, hiking hip and taking larger steps.  Pt unable to complete LAQ on Rt LE today as too weak.  Difficulty establishing and  maintaining standing static balance with Lt LE on 2" box.  No difficulty with Rt LE on box.  Focused on balance and stablity with weight shifts to Rt LE.    PT Next Visit Plan Continue to progress towards goals with improving Rt LE strength and stabliity.  continue to improve ambulation as well.         Problem List There are no active problems to display for this patient.   Teena Irani, PTA/CLT [phone]  12/23/2015, 11:18 AM  [place], [place], [address] Phone: [phone]   Fax:  [phone]  Name: Vincent Anthony MRN: [id number] Date of Birth: 1971/04/15

## 2015-12-25 ENCOUNTER — Ambulatory Visit (HOSPITAL_COMMUNITY): Payer: Medicare Other | Admitting: Physical Therapy

## 2015-12-25 ENCOUNTER — Ambulatory Visit (HOSPITAL_COMMUNITY): Payer: Medicare Other

## 2015-12-25 ENCOUNTER — Encounter (HOSPITAL_COMMUNITY): Payer: Medicare Other | Admitting: Occupational Therapy

## 2015-12-25 DIAGNOSIS — R198 Other specified symptoms and signs involving the digestive system and abdomen: Secondary | ICD-10-CM

## 2015-12-25 DIAGNOSIS — R269 Unspecified abnormalities of gait and mobility: Secondary | ICD-10-CM

## 2015-12-25 DIAGNOSIS — R278 Other lack of coordination: Secondary | ICD-10-CM

## 2015-12-25 DIAGNOSIS — Z9181 History of falling: Secondary | ICD-10-CM

## 2015-12-25 DIAGNOSIS — G35 Multiple sclerosis: Secondary | ICD-10-CM

## 2015-12-25 DIAGNOSIS — R293 Abnormal posture: Secondary | ICD-10-CM

## 2015-12-25 DIAGNOSIS — M25659 Stiffness of unspecified hip, not elsewhere classified: Secondary | ICD-10-CM

## 2015-12-25 DIAGNOSIS — R29898 Other symptoms and signs involving the musculoskeletal system: Secondary | ICD-10-CM

## 2015-12-25 NOTE — Therapy (Signed)
[facility] [address] [place], [place], [address] Phone: [phone]   Fax:  [phone]  Physical Therapy Treatment  Patient Details  Name: Vincent Anthony MRN: [id number] Date of Birth: [birth year] Referring Provider: Dr. Harlen Labs   Encounter Date: 12/25/2015      PT End of Session - 12/25/15 1743    Visit Number 13   Number of Visits 20   Date for PT Re-Evaluation 12/30/15      Past Medical History  Diagnosis Date  . MS (multiple sclerosis) (HCC)   . Headache   . Tremor     Past Surgical History  Procedure Laterality Date  . Knee surgery    . Hernia repair    . Testicle removal      There were no vitals filed for this visit.  Visit Diagnosis:  Coordination abnormal  Hip stiffness, unspecified laterality  Multiple sclerosis (HCC)  Abnormal gait  Poor posture  Weakness of right lower extremity  Abdominal weakness  At risk for injury related to fall      Subjective Assessment - 12/25/15 1450    Subjective Patient reports that things are getting easier, sometimes he stumbles but he feels like he is getting more confident without risk of fall. Still gets fatigued easily.    Pertinent History Patient reports that he has had more problems with his balance and has really gotten weak in his core; has had to walk with a walker recently. Has also gotten an AFO to help keep his R knee from hyperextending. Patient reports he does not feel like it is not working the way he wants it to; in process of tracking down orthotist to get another adjustment to his brace.    Patient Stated Goals be able to transfer with more ease, walk better, better balance    Currently in Pain? Yes   Pain Score 3    Pain Location Other (Comment)  R knee/ankle    Pain Orientation Right   Pain Descriptors / Indicators Aching   Pain Type Chronic pain   Pain Radiating Towards small amount of radiation noted up and down from painful joints     Pain Onset More than a month ago   Pain Frequency Constant   Aggravating Factors  walking    Pain Relieving Factors resting    Effect of Pain on Daily Activities limited weight bearing due to pain in joints                          [facility] Adult PT Treatment/Exercise - 12/25/15 0001    Transfers   Comments sit to supine/supine to sit with Min-Mod(A), cues for proper form but patient continues to have dificulty, depending heavily on abdominals    Knee/Hip Exercises: Stretches   Other Knee/Hip Stretches passive ER/IR of R LE in order to reduce pain and allow for less restricted movement    Knee/Hip Exercises: Seated   Other Seated Knee/Hip Exercises core exercises sitting at edge of mat table: trunk rotations wit no UE support, guarded sit ups onto swiss ball for abdominal activation     Sit to Sand 10 reps;Other (comment)  full stand --> partial sit down, return to stand                 PT Education - 12/25/15 1554    Education provided Yes   Education Details re-eval scheudled for friday so will determine coures  of care at this visit; OTs recommend continuing with them at least until re-eval on 11th per Yevette Edwards) Educated Patient   Methods Explanation   Comprehension Verbalized understanding          PT Short Term Goals - 12/13/15 1141    PT SHORT TERM GOAL #1   Title Patient to be able to perform rolling from supine to either side with correct sequencing and only min guard in order to increase independence in mobility at home    Time 4   Period Weeks   Status Achieved   PT SHORT TERM GOAL #2   Title Patient to be able to perform sit to stand and stand to sit from chairs of various heights with correct sequencing and minimal unsteadiness in order to enhance safety and independence in mobility    Time 4   Period Weeks   Status On-going   PT SHORT TERM GOAL #3   Title Patient to demonstrate improved gait mechanics including reduced toe  drag/scuff R LE, improved stance/swing times both LEs, improved posture during gait, improved gait speed, and safe use of AD,  with cues no mroe than 25% of the time in order to improve efficiency of mobility and safety in gait    Time 4   Period Weeks   Status Achieved   PT SHORT TERM GOAL #4   Title Patient to be independend in wheelchair management and navigation in order to enhance mobility and independence in community    Baseline Pt is not using the wheelchair due to limted space    Time 4   Status Not Met   PT SHORT TERM GOAL #5   Title Patient to be consistent in correctly performing approprate HEP, to be updated PRN    Time 4   Period Weeks   Status On-going           PT Long Term Goals - 12/13/15 1202    PT LONG TERM GOAL #1   Title Patient to demonstrate strength 4-/5 in all tested muscle groups, especally hip extensors and abductors, in order to enhance mobility skills and reduce fall risk    Time 8   Period Weeks   Status On-going   PT LONG TERM GOAL #2   Title Patient to be able to perform all bed mobility and functional transfers with mod(I) and rolling walker in order to enhance independence in mobilty at home with minimal fall risk    Baseline 2/5   Time 8   Period Weeks   Status On-going   PT LONG TERM GOAL #3   Title Patient to report no falls within the past 4 weeks in order to demosntrate improved safety at home and minimal risk of injury with mobility    Baseline 12/02/15* fall one week ago    Time 8   Period Weeks   Status On-going   PT LONG TERM GOAL #4   Title Patient to be able to perform TUG test in 1:45 seconds or less with rolling walker in order to demonstrate improved general mobility and balance    Status Revised  from 20 seconds    PT LONG TERM GOAL #5   Title Patient to maintain correct posture 80% of the time when sitting on all surfaces in order to demonstrate improved core strength and improved body awareness for mobilty    Time 8    Period Weeks   Status On-going  Plan - 12/25/15 1625    Clinical Impression Statement Patient arrived late today, he reports due to traffic. Began session with pasive rotation of R LE in order to reduce tone and allow patient to move through activities with more ease. Perofrmed sit to stnads with parital lower before subsequent stand  in order to address muscle control and timing, also performed core work at edge of table with noted difficulty and fatigue from pateint. Educated patient that he will be re-assessed friday to determine ongoing course of care; also spoke to OT  who asked him to schedule OT appointments up to the 11th when their resassessment is scheduled.    Pt will benefit from skilled therapeutic intervention in order to improve on the following deficits Abnormal gait;Decreased endurance;Hypomobility;Decreased activity tolerance;Decreased strength;Decreased balance;Decreased mobility;Difficulty walking;Improper body mechanics;Decreased coordination;Decreased safety awareness;Postural dysfunction;Decreased range of motion;Pain   Rehab Potential Good   Clinical Impairments Affecting Rehab Potential ongoing MS and complications thereof    PT Frequency 2x / week   PT Duration 6 weeks   PT Treatment/Interventions ADLs/Self Care Home Management;DME Instruction;Gait training;Stair training;Functional mobility training;Therapeutic activities;Therapeutic exercise;Balance training;Neuromuscular re-education;Patient/family education;Wheelchair mobility training;Manual techniques;Energy conservation;Electrical Stimulation;Cryotherapy;Moist Heat;Orthotic Fit/Training;Passive range of motion   PT Next Visit Plan Re-assess/update cert    PT Home Exercise Plan Continue with previous HEP   Consulted and Agree with Plan of Care Patient        Problem List There are no active problems to display for this patient.   Deniece Ree PT, DPT [id number]  [facility] [address] [place], [place], [address] Phone: [phone]   Fax:  [phone]  Name: Vincent Anthony MRN: [id number] Date of Birth: 1971/08/17

## 2015-12-27 ENCOUNTER — Ambulatory Visit (HOSPITAL_COMMUNITY): Payer: Medicare Other | Admitting: Physical Therapy

## 2015-12-27 DIAGNOSIS — G35 Multiple sclerosis: Secondary | ICD-10-CM

## 2015-12-27 DIAGNOSIS — R293 Abnormal posture: Secondary | ICD-10-CM

## 2015-12-27 DIAGNOSIS — R269 Unspecified abnormalities of gait and mobility: Secondary | ICD-10-CM

## 2015-12-27 DIAGNOSIS — R278 Other lack of coordination: Secondary | ICD-10-CM

## 2015-12-27 DIAGNOSIS — M25659 Stiffness of unspecified hip, not elsewhere classified: Secondary | ICD-10-CM | POA: Diagnosis not present

## 2015-12-27 DIAGNOSIS — R29898 Other symptoms and signs involving the musculoskeletal system: Secondary | ICD-10-CM

## 2015-12-27 DIAGNOSIS — R198 Other specified symptoms and signs involving the digestive system and abdomen: Secondary | ICD-10-CM

## 2015-12-27 NOTE — Therapy (Signed)
[place], [place], [address] Phone: [phone]   Fax:  [phone]  Physical Therapy Treatment  Patient Details  Name: Vincent Anthony MRN: [id number] Date of Birth: [birth year] Referring Provider: Dr. Ala Bent   Encounter Date: 12/27/2015      PT End of Session - 12/27/15 1528    Visit Number 14   Number of Visits 20   Date for PT Re-Evaluation 12/30/15   Authorization Type need gcode on visigt 17   Authorization Time Period 4/13 reassessement   Authorization - Visit Number 14   Authorization - Number of Visits 20   PT Start Time 2585   PT Stop Time 1524   PT Time Calculation (min) 42 min   Equipment Utilized During Treatment Gait belt      Past Medical History  Diagnosis Date  . MS (multiple sclerosis) ([place])   . Headache   . Tremor     Past Surgical History  Procedure Laterality Date  . Knee surgery    . Hernia repair    . Testicle removal      There were no vitals filed for this visit.  Visit Diagnosis:  Coordination abnormal  Hip stiffness, unspecified laterality  Multiple sclerosis (HCC)  Abnormal gait  Poor posture  Weakness of right lower extremity  Abdominal weakness          [facility] PT Assessment - 12/27/15 0001    Precautions   Precautions Fall   Functional Tests   Functional tests Sit to Stand   Sit to Stand   Comments unable to come sit to stand withiout moderate assist    Strength   Right Hip Flexion 2-/5  ws 2/5    Right Hip Extension 2/5   Right Hip ABduction 2-/5   Right Hip ADduction 2+/5   Left Hip Flexion 2/5  was 2/5    Left Hip Extension 2+/5   Left Hip ABduction 2+/5  was 2/5    Left Hip ADduction 2+/5   Right Knee Extension 4-/5  was 3+/5   Left Knee Extension 5/5  was 4+/5    Left Ankle Dorsiflexion 5/5  was 4+/5   Bed Mobility   Rolling Right 6: Modified independent (Device/Increase time)  was min assist    Rolling Left 6:  Modified independent (Device/Increase time)   Supine to Sit 4: Min guard  was moderate assist    Sit to Supine 4: Min assist  was moderate assist    Ambulation/Gait   Ambulation/Gait Assistance 5: Supervision   Assistive device Rolling walker   Gait Pattern Decreased stride length   Gait velocity slow   Gait Comments verbal cuing to increase stride length    Timed Up and Go Test   TUG Normal TUG   Normal TUG (seconds) --  2:01                [facility] Adult PT Treatment/Exercise - 12/27/15 0001    Ambulation/Gait   Ambulation/Gait --   Ambulation Distance (Feet) 60 Feet  x1; 50 x 5             Balance Exercises - 12/27/15 1453    Balance Exercises: Standing   Standing Eyes Opened Narrow base of support (BOS)   Tandem Stance Eyes open;2 reps   Sidestepping 2 reps  at mat    Marching Limitations 10   Sit to Stand Time 10   Other Standing Exercises Rt foot on  2" step  place foot on step while sitting then stand              PT Short Term Goals - 12/13/15 1141    PT SHORT TERM GOAL #1   Title Patient to be able to perform rolling from supine to either side with correct sequencing and only min guard in order to increase independence in mobility at home    Time 4   Period Weeks   Status Achieved   PT SHORT TERM GOAL #2   Title Patient to be able to perform sit to stand and stand to sit from chairs of various heights with correct sequencing and minimal unsteadiness in order to enhance safety and independence in mobility    Time 4   Period Weeks   Status On-going   PT SHORT TERM GOAL #3   Title Patient to demonstrate improved gait mechanics including reduced toe drag/scuff R LE, improved stance/swing times both LEs, improved posture during gait, improved gait speed, and safe use of AD,  with cues no mroe than 25% of the time in order to improve efficiency of mobility and safety in gait    Time 4   Period Weeks   Status Achieved   PT SHORT TERM GOAL #4    Title Patient to be independend in wheelchair management and navigation in order to enhance mobility and independence in community    Baseline Pt is not using the wheelchair due to limted space    Time 4   Status Not Met   PT SHORT TERM GOAL #5   Title Patient to be consistent in correctly performing approprate HEP, to be updated PRN    Time 4   Period Weeks   Status On-going           PT Long Term Goals - 12/13/15 1202    PT LONG TERM GOAL #1   Title Patient to demonstrate strength 4-/5 in all tested muscle groups, especally hip extensors and abductors, in order to enhance mobility skills and reduce fall risk    Time 8   Period Weeks   Status On-going   PT LONG TERM GOAL #2   Title Patient to be able to perform all bed mobility and functional transfers with mod(I) and rolling walker in order to enhance independence in mobilty at home with minimal fall risk    Baseline 2/5   Time 8   Period Weeks   Status On-going   PT LONG TERM GOAL #3   Title Patient to report no falls within the past 4 weeks in order to demosntrate improved safety at home and minimal risk of injury with mobility    Baseline 12/02/15* fall one week ago    Time 8   Period Weeks   Status On-going   PT LONG TERM GOAL #4   Title Patient to be able to perform TUG test in 1:45 seconds or less with rolling walker in order to demonstrate improved general mobility and balance    Status Revised  from 20 seconds    PT LONG TERM GOAL #5   Title Patient to maintain correct posture 80% of the time when sitting on all surfaces in order to demonstrate improved core strength and improved body awareness for mobilty    Time 8   Period Weeks   Status On-going               Plan - 12/27/15 1513    Clinical Impression Statement  Pt late for todays appointment.  Today appointment focused on balance as pt is waliking at home. Pt needs moderate assist with higher level balance actiities;( pt would have fallen without  therapist assist. )  Pt to continue twice a week for three more weeks.    Pt will benefit from skilled therapeutic intervention in order to improve on the following deficits Abnormal gait;Decreased endurance;Hypomobility;Decreased activity tolerance;Decreased strength;Decreased balance;Decreased mobility;Difficulty walking;Improper body mechanics;Decreased coordination;Decreased safety awareness;Postural dysfunction;Decreased range of motion;Pain   Rehab Potential Good   PT Frequency 2x / week   PT Next Visit Plan PT to continue for 3 more weeks focusing on balance.         Problem List There are no active problems to display for this patient.  Rayetta Humphrey, PT CLT [phone] 12/27/2015, 3:53 PM  [facility] [address] [place], [place], [address] Phone: [phone]   Fax:  [phone]  Name: Vincent Anthony MRN: [id number] Date of Birth: 1971/08/14

## 2015-12-30 ENCOUNTER — Ambulatory Visit (HOSPITAL_COMMUNITY): Payer: Medicare Other | Admitting: Occupational Therapy

## 2015-12-31 ENCOUNTER — Ambulatory Visit (HOSPITAL_COMMUNITY): Payer: Medicare Other | Attending: Neurology | Admitting: Occupational Therapy

## 2015-12-31 DIAGNOSIS — R293 Abnormal posture: Secondary | ICD-10-CM | POA: Diagnosis present

## 2015-12-31 DIAGNOSIS — R262 Difficulty in walking, not elsewhere classified: Secondary | ICD-10-CM | POA: Diagnosis present

## 2015-12-31 DIAGNOSIS — R278 Other lack of coordination: Secondary | ICD-10-CM | POA: Diagnosis present

## 2015-12-31 DIAGNOSIS — R29898 Other symptoms and signs involving the musculoskeletal system: Secondary | ICD-10-CM

## 2015-12-31 DIAGNOSIS — R2689 Other abnormalities of gait and mobility: Secondary | ICD-10-CM | POA: Diagnosis present

## 2015-12-31 DIAGNOSIS — M6281 Muscle weakness (generalized): Secondary | ICD-10-CM | POA: Diagnosis present

## 2015-12-31 NOTE — Therapy (Signed)
[place] [facility] [address] [place], [place], [address] Phone: [phone]   Fax:  [phone]  Occupational Therapy Treatment  Patient Details  Name: Vincent Anthony MRN: [id number] Date of Birth: Jul 25, 1971 Referring Provider: Dr. Harlen Labs  Encounter Date: 12/31/2015      OT End of Session - 12/31/15 1558    Visit Number 8   Number of Visits 12   Date for OT Re-Evaluation 01/07/16   Authorization Type Medicare/Medicare A & B   Authorization Time Period Before 10th visit   Authorization - Visit Number 8   Authorization - Number of Visits 10   OT Start Time 1433   OT Stop Time 1515   OT Time Calculation (min) 42 min   Activity Tolerance Patient tolerated treatment well   Behavior During Therapy [facility] for tasks assessed/performed      Past Medical History  Diagnosis Date  . MS (multiple sclerosis) (HCC)   . Headache   . Tremor     Past Surgical History  Procedure Laterality Date  . Knee surgery    . Hernia repair    . Testicle removal      There were no vitals filed for this visit.  Visit Diagnosis:  Weakness of right upper extremity  Decreased grip strength of right hand  Decreased pinch strength          [facility] OT Assessment - 12/31/15 1558    Assessment   Diagnosis multiple sclerosis   Precautions   Precautions Fall                  OT Treatments/Exercises (OP) - 12/31/15 1438    Exercises   Exercises Hand;Wrist   Wrist Exercises   Wrist Radial Deviation Strengthening;15 reps   Bar Weights/Barbell (Radial Deviation) 3 lbs   Wrist Ulnar Deviation Strengthening;15 reps   Bar Weights/Barbell (Ulnar Deviation) 3 lbs   Additional Wrist Exercises   Theraputty Grip;Pinch   Theraputty - Grip red pronated   Theraputty - Pinch red lateral pinch   Hand Gripper with Large Beads 6/6 with gripper at 15#   Hand Gripper with Medium Beads 12/12 with gripper at 15#   Hand Exercises   Other Hand  Exercises Pt used yellow resistive clothespin to place 15 high resistance sponges into container. Pt with mod difficulty, used left hand to stabilize sponges. Pt used red resistive clothespin to place 2 sponges into container with max difficulty.                   OT Short Term Goals - 11/22/15 1243    OT SHORT TERM GOAL #1   Title Pt will be educated on and independent in HEP.    Time 6   Period Weeks   Status On-going   OT SHORT TERM GOAL #2   Title Pt will increase right grip strength by 10# to improve ability to grasp and hold grooming objects during daily routine.    Time 6   Period Weeks   Status On-going   OT SHORT TERM GOAL #3   Title Pt will increase right pinch strength by 3# to increase ability to grasp writing and eating utensils.     Time 6   Period Weeks   Status On-going   OT SHORT TERM GOAL #4   Title Pt will increase right wrist strength to 4+/5 to increase ability to use RUE for support when completing daily and work tasks.  Time 6   Period Weeks   Status On-going   OT SHORT TERM GOAL #5   Title Pt will demonstrate independence in operating manual wheelchair and manuevering around obstacles.    Time 6   Period Weeks   Status On-going   OT SHORT TERM GOAL #6   Title Pt will demonstrate independence in loading and unloading wheelchair from car to increase ability to complete community mobility tasks.     Time 6   Period Weeks   Status On-going   OT SHORT TERM GOAL #7   Title Pt will increase knowledge of compensatory strategies for ADL completion by verbally relaying current and newly learned strategies to OT.    Time 6   Period Weeks   Status On-going                  Plan - 12/31/15 1559    Clinical Impression Statement A: Continued grip and pinch strengthening this session, pt completed gripper task in appropriate amount of time with min difficulty at 15 pounds this session. Attempted 18 pounds, pt unable to completed. Increased  weight to 3 pounds for radial & ulnar deviation exercises, min difficulty, verbal cuing for speed.    Plan P: Reassessment. Continue with exercises to strengthen wrist, grip, and pinch. Follow up on information provided about adaptive aids for writing utensils, provide information of adaptive eating utensils or built-up grips for utensils.         Problem List There are no active problems to display for this patient.   Ezra Sites, OTR/L  [phone]  12/31/2015, 4:04 PM   [facility] [address] [place], [place], [address] Phone: [phone]   Fax:  [phone]  Name: Vincent Anthony MRN: [id number] Date of Birth: 1971/04/29

## 2016-01-01 ENCOUNTER — Ambulatory Visit (HOSPITAL_COMMUNITY): Payer: Medicare Other | Admitting: Physical Therapy

## 2016-01-03 ENCOUNTER — Ambulatory Visit (HOSPITAL_COMMUNITY): Payer: Medicare Other | Admitting: Physical Therapy

## 2016-01-07 ENCOUNTER — Encounter (HOSPITAL_COMMUNITY): Payer: Self-pay | Admitting: Occupational Therapy

## 2016-01-07 ENCOUNTER — Ambulatory Visit (HOSPITAL_COMMUNITY): Payer: Medicare Other | Admitting: Occupational Therapy

## 2016-01-07 ENCOUNTER — Ambulatory Visit (HOSPITAL_COMMUNITY): Payer: Medicare Other | Admitting: Physical Therapy

## 2016-01-07 DIAGNOSIS — R29898 Other symptoms and signs involving the musculoskeletal system: Secondary | ICD-10-CM | POA: Diagnosis not present

## 2016-01-07 DIAGNOSIS — R293 Abnormal posture: Secondary | ICD-10-CM

## 2016-01-07 DIAGNOSIS — R2689 Other abnormalities of gait and mobility: Secondary | ICD-10-CM

## 2016-01-07 DIAGNOSIS — M6281 Muscle weakness (generalized): Secondary | ICD-10-CM

## 2016-01-07 DIAGNOSIS — R278 Other lack of coordination: Secondary | ICD-10-CM

## 2016-01-07 DIAGNOSIS — R262 Difficulty in walking, not elsewhere classified: Secondary | ICD-10-CM

## 2016-01-07 NOTE — Therapy (Signed)
[place], [place], [address] Phone: [phone]   Fax:  [phone]  Occupational Therapy Reassessment, Treatment, Discharge Summary  Patient Details  Name: Vincent Anthony MRN: [id number] Date of Birth: 06-Dec-1970 Referring Provider: Dr. Ala Bent  Encounter Date: 01/07/2016      OT End of Session - 01/07/16 1717    Visit Number 9   Number of Visits 12   Date for OT Re-Evaluation 01/07/16   Authorization Type Medicare/Medicare A & B   Authorization Time Period Before 10th visit   Authorization - Visit Number 9   Authorization - Number of Visits 10   OT Start Time 2423   OT Stop Time 1518   OT Time Calculation (min) 44 min   Activity Tolerance Patient tolerated treatment well   Behavior During Therapy [facility] for tasks assessed/performed      Past Medical History  Diagnosis Date  . MS (multiple sclerosis) ([place])   . Headache   . Tremor     Past Surgical History  Procedure Laterality Date  . Knee surgery    . Hernia repair    . Testicle removal      There were no vitals filed for this visit.      Subjective Assessment - 01/07/16 1714    Subjective  S: I've been doing my exercises when I'm sitting on the couch watching tv.    Currently in Pain? No/denies           [facility] OT Assessment - 01/07/16 1711    Assessment   Diagnosis multiple sclerosis   Precautions   Precautions Fall   Coordination   Right 9 Hole Peg Test All pegs placed in 4'45" independently; all pegs placed in 1'15" with left hand setting up pegs for right   Strength   Strength Assessment Site Shoulder   Right/Left Shoulder Right   Right Shoulder Flexion 4+/5  same as previous   Right Shoulder ABduction 4+/5  same as previous   Right Shoulder Internal Rotation 4/5  same as previous   Right Shoulder External Rotation 4/5  same as previous   Right/Left Elbow Right   Right Elbow Flexion 4+/5  same as previous   Right  Elbow Extension 4/5  4-/5 previous   Right/Left Wrist Right   Right Wrist Flexion 4-/5  same as previous   Right Wrist Extension 4/5  4-/5 previous   Right Wrist Radial Deviation 4/5  4-/5 previous   Right Wrist Ulnar Deviation 4/5  4-/5 previous   Right/Left hand Right   Right Hand Grip (lbs) 45  48 previous   Right Hand Lateral Pinch 17 lbs  16 previous                  OT Treatments/Exercises (OP) - 01/07/16 1715    Exercises   Exercises Hand;Wrist   Wrist Exercises   Wrist Flexion Strengthening;15 reps   Bar Weights/Barbell (Wrist Flexion) 3 lbs   Wrist Extension Strengthening;15 reps   Bar Weights/Barbell (Wrist Extension) 3 lbs   Wrist Radial Deviation Strengthening;15 reps   Bar Weights/Barbell (Radial Deviation) 3 lbs   Wrist Ulnar Deviation Strengthening;15 reps   Bar Weights/Barbell (Ulnar Deviation) 3 lbs   Additional Wrist Exercises   Hand Gripper with Large Beads 6/6 with gripper at 15#   Hand Gripper with Medium Beads 5/5 gripper at 15#              OT Education -  01-09-16 1716    Education provided Yes   Education Details wrist and forearm strengthening exercises, provided information on purchasing eating utensils with built up handles   Person(s) Educated Patient   Methods Explanation;Handout;Demonstration   Comprehension Verbalized understanding;Returned demonstration          OT Short Term Goals - 01/09/16 1719    OT SHORT TERM GOAL #1   Title Pt will be educated on and independent in [place].    Time 6   Period Weeks   Status Achieved   OT SHORT TERM GOAL #2   Title Pt will increase right grip strength by 10# to improve ability to grasp and hold grooming objects during daily routine.    Time 6   Period Weeks   Status Not Met   OT SHORT TERM GOAL #3   Title Pt will increase right pinch strength by 3# to increase ability to grasp writing and eating utensils.     Time 6   Period Weeks   Status Not Met   OT SHORT TERM GOAL #4    Title Pt will increase right wrist strength to 4+/5 to increase ability to use RUE for support when completing daily and work tasks.    Time 6   Period Weeks   Status Not Met   OT SHORT TERM GOAL #5   Title Pt will demonstrate independence in operating manual wheelchair and manuevering around obstacles.    Time 6   Period Weeks   Status Deferred   OT SHORT TERM GOAL #6   Title Pt will demonstrate independence in loading and unloading wheelchair from car to increase ability to complete community mobility tasks.     Time 6   Period Weeks   Status Deferred   OT SHORT TERM GOAL #7   Title Pt will increase knowledge of compensatory strategies for ADL completion by verbally relaying current and newly learned strategies to OT.    Time 6   Period Weeks   Status Achieved                  Plan - 01/09/2016 1718    Clinical Impression Statement A: Reassessment completed this session. Pt has met 2/5 STGs and has made progress towards remaining goals throughout OT sessions including grip and pinch strength, as well as demonstrating improvements in fine motor coordination of right hand. Pt is agreeable to discharge this session. Pt provided with wrist and forearm strengthening HEP, as well as information on purchasing adaptive utensils to promote improved grip using RUE.    Rehab Potential Good   OT Frequency 2x / week   OT Duration 6 weeks   OT Treatment/Interventions Self-care/ADL training;Energy conservation;Patient/family education;Passive range of motion;Splinting;Therapeutic exercise;Manual Therapy;Therapeutic activities;Functional Mobility Training;DME and/or AE instruction   Plan P: Pt discharged with HEP   OT Home Exercise Plan wrist and forearm strengthening   Consulted and Agree with Plan of Care Patient      Patient will benefit from skilled therapeutic intervention in order to improve the following deficits and impairments:  Decreased strength, Decreased activity  tolerance, Impaired UE functional use, Decreased range of motion, Decreased coordination, Impaired flexibility, Decreased knowledge of use of DME  Visit Diagnosis: Muscle weakness (generalized)  Other lack of coordination      G-Codes - 01/09/2016 1723    Functional Assessment Tool Used clincial judgement   Functional Limitation Carrying, moving and handling objects   Carrying, Moving and Handling Objects Goal Status ([id number]) At least  20 percent but less than 40 percent impaired, limited or restricted   Carrying, Moving and Handling Objects Discharge Status [phone]) At least 40 percent but less than 60 percent impaired, limited or restricted      Problem List There are no active problems to display for this patient.   Guadelupe Sabin, OTR/L  [phone]  01/07/2016, 5:26 PM  [facility] [address] [place], [place], [address] Phone: [phone]   Fax:  [phone]  Name: Vincent Anthony MRN: [id number] Date of Birth: [birth year]  OCCUPATIONAL THERAPY DISCHARGE SUMMARY  Visits from Start of Care: 9  Current functional level related to goals / functional outcomes: See above. Pt does demonstrate improvements in the areas of grip strength, lateral pinch strength, and fine motor coordination of RUE. Pt is also able to verbalize compensatory strategies for completing tasks with RUE using adaptive equipment.    Remaining deficits: Pt continues to have deficits in the areas of strength and functional completion of daily activities using RUE, as well as difficulties with fine motor tasks.    Education / Equipment: Pt provided with wrist and forearm strengthening exercises, as well as education on adaptive equipment available for purchase including built up pencil grips and eating utensils.  Plan: Patient agrees to discharge.  Patient goals were partially met. Patient is being discharged due to lack of progress.  ?????

## 2016-01-07 NOTE — Patient Instructions (Signed)
Strengthening Exercises  1) WRIST EXTENSION CURLS - TABLE  Hold a small free weight, rest your forearm on a table and bend your wrist up and down with your palm face down as shown.      2) WRIST FLEXION CURLS - TABLE  Hold a small free weight, rest your forearm on a table and bend your wrist up and down with your palm face up as shown.     3) FREE WEIGHT RADIAL/ULNAR DEVIATION - TABLE  Hold a small free weight, rest your forearm on a table and bend your wrist up and down with your palm facing towards the side as shown.     4) Pronation  Forearm supported on table with wrist in neutral position. Using a weight, roll wrist so that palm faces downward. Hold for 2 seconds and return to starting position.     5) Supination  Forearm supported on table with wrist in neutral position. Using a weight, roll wrist so that palm is now facing upward. Hold for 2 seconds and return to starting position.      *Complete exercises using _2-3___ pound weight, __10-15__times each, __1-2__times per day*

## 2016-01-07 NOTE — Therapy (Signed)
[place], [place], [address] Phone: [phone]   Fax:  [phone]  Physical Therapy Treatment  Patient Details  Name: Vincent Anthony MRN: [id number] Date of Birth: October 26, 1970 Referring Provider: Dr. Ala Bent   Encounter Date: 01/07/2016      PT End of Session - 01/07/16 1642    Visit Number 15   Number of Visits 20   Date for PT Re-Evaluation 12/30/15   Authorization - Visit Number 15   Authorization - Number of Visits 20   PT Start Time 6759   PT Stop Time 1620   PT Time Calculation (min) 50 min   Equipment Utilized During Treatment Gait belt   Activity Tolerance Patient tolerated treatment well   Behavior During Therapy [facility] for tasks assessed/performed      Past Medical History  Diagnosis Date  . MS (multiple sclerosis) ([place])   . Headache   . Tremor     Past Surgical History  Procedure Laterality Date  . Knee surgery    . Hernia repair    . Testicle removal      There were no vitals filed for this visit.      Subjective Assessment - 01/07/16 1548    Subjective Pt states he is doing well other than his balance.  STates he still gets tired easily   Currently in Pain? No/denies                         [facility] Adult PT Treatment/Exercise - 01/07/16 0001    Ambulation/Gait   Ambulation/Gait Assistance 4: Min guard   Ambulation Distance (Feet) 100 Feet  50 feet X 2    Assistive device Hemi-walker   Gait Pattern Decreased stride length   Ambulation Surface Level             Balance Exercises - 01/07/16 1603    Balance Exercises: Standing   Tandem Stance Eyes open;1 rep  used UE to get feet staggered (unable to do tandem)   Sidestepping 2 reps  1 HHA on therapist, no AD   Sit to Stand Time 10  no UE's             PT Short Term Goals - 12/13/15 1141    PT SHORT TERM GOAL #1   Title Patient to be able to perform rolling from supine to either side  with correct sequencing and only min guard in order to increase independence in mobility at home    Time 4   Period Weeks   Status Achieved   PT SHORT TERM GOAL #2   Title Patient to be able to perform sit to stand and stand to sit from chairs of various heights with correct sequencing and minimal unsteadiness in order to enhance safety and independence in mobility    Time 4   Period Weeks   Status On-going   PT SHORT TERM GOAL #3   Title Patient to demonstrate improved gait mechanics including reduced toe drag/scuff R LE, improved stance/swing times both LEs, improved posture during gait, improved gait speed, and safe use of AD,  with cues no mroe than 25% of the time in order to improve efficiency of mobility and safety in gait    Time 4   Period Weeks   Status Achieved   PT SHORT TERM GOAL #4   Title Patient to be independend in wheelchair management and navigation in order to  enhance mobility and independence in community    Baseline Pt is not using the wheelchair due to limted space    Time 4   Status Not Met   PT SHORT TERM GOAL #5   Title Patient to be consistent in correctly performing approprate HEP, to be updated PRN    Time 4   Period Weeks   Status On-going           PT Long Term Goals - 12/13/15 1202    PT LONG TERM GOAL #1   Title Patient to demonstrate strength 4-/5 in all tested muscle groups, especally hip extensors and abductors, in order to enhance mobility skills and reduce fall risk    Time 8   Period Weeks   Status On-going   PT LONG TERM GOAL #2   Title Patient to be able to perform all bed mobility and functional transfers with mod(I) and rolling walker in order to enhance independence in mobilty at home with minimal fall risk    Baseline 2/5   Time 8   Period Weeks   Status On-going   PT LONG TERM GOAL #3   Title Patient to report no falls within the past 4 weeks in order to demosntrate improved safety at home and minimal risk of injury with  mobility    Baseline 12/02/15* fall one week ago    Time 8   Period Weeks   Status On-going   PT LONG TERM GOAL #4   Title Patient to be able to perform TUG test in 1:45 seconds or less with rolling walker in order to demonstrate improved general mobility and balance    Status Revised  from 20 seconds    PT LONG TERM GOAL #5   Title Patient to maintain correct posture 80% of the time when sitting on all surfaces in order to demonstrate improved core strength and improved body awareness for mobilty    Time 8   Period Weeks   Status On-going               Plan - 01/07/16 1643    Clinical Impression Statement Focused session on balance and improving functional actvity performance.  Pt contiinues to show improvement with sit to stands with better eccentric control than concentric.  Progressed to gait with hemiwalker today with overall good stabiltiy noted but fatigued quickly with max of 50 feet completed X 2 bouts.  Pt requires UE assist to position LE's for actvities and is unable to stand in true tandem due to instabiltiy.  Cues required to shift weight to Lt to equalize support. Pt reassessed last session with continuation of 2 more weeks per PT with focus on balance.  Encouraged pateint to complete some type of cardio workout at home as he has a stationary bike and has access to the gym with his brother.     Rehab Potential Good   PT Frequency 2x / week   PT Next Visit Plan PT to continue for 5 more visits weeks focusing on balance.       Patient will benefit from skilled therapeutic intervention in order to improve the following deficits and impairments:  Abnormal gait, Decreased endurance, Hypomobility, Decreased activity tolerance, Decreased strength, Decreased balance, Decreased mobility, Difficulty walking, Improper body mechanics, Decreased coordination, Decreased safety awareness, Postural dysfunction, Decreased range of motion, Pain  Visit Diagnosis: Muscle weakness  (generalized)  Other abnormalities of gait and mobility  Posture abnormality  Difficulty in walking, not elsewhere classified  Problem List There are no active problems to display for this patient.   Teena Irani, PTA/CLT [phone]  01/07/2016, 5:39 PM  [facility] [address] [place], [place], [address] Phone: [phone]   Fax:  [phone]  Name: DYLLEN MENNING MRN: [id number] Date of Birth: 1971/05/05

## 2016-01-10 ENCOUNTER — Encounter (HOSPITAL_COMMUNITY): Payer: Medicare Other

## 2016-01-10 ENCOUNTER — Ambulatory Visit (HOSPITAL_COMMUNITY): Payer: Medicare Other | Admitting: Physical Therapy

## 2016-01-10 DIAGNOSIS — R262 Difficulty in walking, not elsewhere classified: Secondary | ICD-10-CM

## 2016-01-10 DIAGNOSIS — R2689 Other abnormalities of gait and mobility: Secondary | ICD-10-CM

## 2016-01-10 DIAGNOSIS — R293 Abnormal posture: Secondary | ICD-10-CM

## 2016-01-10 DIAGNOSIS — R29898 Other symptoms and signs involving the musculoskeletal system: Secondary | ICD-10-CM | POA: Diagnosis not present

## 2016-01-10 DIAGNOSIS — M6281 Muscle weakness (generalized): Secondary | ICD-10-CM

## 2016-01-10 NOTE — Therapy (Signed)
[place], [place], [address] Phone: [phone]   Fax:  [phone]  Physical Therapy Treatment  Patient Details  Name: Vincent Anthony MRN: [id number] Date of Birth: [birth year] Referring Provider: Dr. Ala Bent   Encounter Date: 01/10/2016      PT End of Session - 01/10/16 1623    Visit Number 16   Number of Visits 20   Date for PT Re-Evaluation 02/06/16   Authorization Type need gcode on visigt 17   Authorization Time Period 1/77/93 end [place] - Visit Number 16   Authorization - Number of Visits 20   PT Start Time 9030   PT Stop Time 1515   PT Time Calculation (min) 38 min   Equipment Utilized During Treatment Gait belt   Activity Tolerance Patient tolerated treatment well   Behavior During Therapy [facility] for tasks assessed/performed      Past Medical History  Diagnosis Date  . MS (multiple sclerosis) ([place])   . Headache   . Tremor     Past Surgical History  Procedure Laterality Date  . Knee surgery    . Hernia repair    . Testicle removal      There were no vitals filed for this visit.      Subjective Assessment - 01/10/16 1442    Subjective Pt states he is doing good today. Rates B ankles and knees are 2 or 3/10 pain on VAS. His exercises are going well at home.    Pertinent History Patient reports that he has had more problems with his balance and has really gotten weak in his core; has had to walk with a walker recently. Has also gotten an AFO to help keep his R knee from hyperextending. Patient reports he does not feel like it is not working the way he wants it to; in process of tracking down orthotist to get another adjustment to his brace.    Patient Stated Goals be able to transfer with more ease, walk better, better balance    Currently in Pain? Yes   Pain Score 3    Pain Location Ankle   Pain Orientation Right;Left   Pain Descriptors / Indicators Aching   Pain Type  Chronic pain   Pain Onset More than a month ago   Pain Frequency Constant   Aggravating Factors  walking    Pain Relieving Factors resting                         [facility] Adult PT Treatment/Exercise - 01/10/16 0001    Bed Mobility   Supine to Sit 4: Min assist   Transfers   Sit to Stand 6: Modified independent (Device/Increase time)   Ambulation/Gait   Ambulation/Gait Assistance 5: Supervision   Ambulation Distance (Feet) 100 Feet   Assistive device Rolling walker   Gait Pattern Step-to pattern;Decreased hip/knee flexion - right;Right hip hike;Poor foot clearance - right   Ambulation Surface Level   Gait velocity slow   Knee/Hip Exercises: Seated   Sit to Sand 3 sets;5 reps;with UE support  focus on eccentric lowering, crashing onto chair 2/5 trials.   Knee/Hip Exercises: Supine   Heel Slides AAROM;Right;5 reps             Balance Exercises - 01/10/16 1451    Balance Exercises: Standing   Tandem Stance Eyes open;2 reps;30 secs  30 sec max; use UE to get into position  Sit to Stand Time with UE 3x5 reps noting crashing to the chair 2/5 trials           PT Education - 01/10/16 1622    Education provided Yes   Education Details discussed controlled sitting for improved safety with transfers   Person(s) Educated Patient   Methods Explanation   Comprehension Verbalized understanding;Returned demonstration          PT Short Term Goals - 01/10/16 1446    PT SHORT TERM GOAL #1   Title Patient to be able to perform rolling from supine to either side with correct sequencing and only min guard in order to increase independence in mobility at home    Time 4   Period Weeks   Status Achieved   PT SHORT TERM GOAL #2   Title Patient to be able to perform sit to stand and stand to sit from chairs of various heights with correct sequencing and minimal unsteadiness in order to enhance safety and independence in mobility    Baseline Pt with unsteadiness going  from stand to sit, often crashing into the chair. Difficulty getting up out of low chairs   Time 4   Period Weeks   Status On-going   PT SHORT TERM GOAL #3   Title Patient to demonstrate improved gait mechanics including reduced toe drag/scuff R LE, improved stance/swing times both LEs, improved posture during gait, improved gait speed, and safe use of AD,  with cues no mroe than 25% of the time in order to improve efficiency of mobility and safety in gait    Time 4   Period Weeks   Status Achieved   PT SHORT TERM GOAL #4   Title Patient to be independend in wheelchair management and navigation in order to enhance mobility and independence in community    Baseline Pt is not using the wheelchair due to limited space    Time 4   Status Not Met   PT SHORT TERM GOAL #5   Title Patient to be consistent in correctly performing approprate HEP, to be updated PRN    Time 4   Period Weeks   Status On-going           PT Long Term Goals - 01/10/16 1448    PT LONG TERM GOAL #1   Title Patient to demonstrate strength 4-/5 in all tested muscle groups, especally hip extensors and abductors, in order to enhance mobility skills and reduce fall risk    Time 8   Period Weeks   Status On-going   PT LONG TERM GOAL #2   Title Patient to be able to perform all bed mobility and functional transfers with mod(I) and rolling walker in order to enhance independence in mobilty at home with minimal fall risk    Baseline 2/5   Time 8   Period Weeks   Status On-going   PT LONG TERM GOAL #3   Title Patient to report no falls within the past 4 weeks in order to demosntrate improved safety at home and minimal risk of injury with mobility    Baseline 12/02/15* fall one week ago; 01/09/13: no falls in the past 3-4 weeks   Time 8   Period Weeks   Status On-going   PT LONG TERM GOAL #4   Title Patient to be able to perform TUG test in 1:45 seconds or less with rolling walker in order to demonstrate improved general  mobility and balance    Status Revised  from 20 seconds    PT LONG TERM GOAL #5   Title Patient to maintain correct posture 80% of the time when sitting on all surfaces in order to demonstrate improved core strength and improved body awareness for mobilty    Time 8   Period Weeks   Status On-going               Plan - 01/10/16 1625    Clinical Impression Statement Pt session focused on balance and sit to stand activity focusing on eccentric control during sit to stand transfers. Pt continues to demonstrate limited Rt hip flexor activation which is making it difficulty for him to advance his leg during activity. His balance is improving evident by maintaining tandem stance for atleast 30 sec with each LE forward without LOB. He continues to go to the gym with friends for improved conditioning and feels he has made an overall 50% improvement since he first starting coming to PT. will continue with current POC.    Rehab Potential Good   PT Frequency 2x / week   PT Treatment/Interventions ADLs/Self Care Home Management;DME Instruction;Gait training;Stair training;Functional mobility training;Therapeutic activities;Therapeutic exercise;Balance training;Neuromuscular re-education;Patient/family education;Wheelchair mobility training;Manual techniques;Energy conservation;Electrical Stimulation;Cryotherapy;Moist Heat;Orthotic Fit/Training;Passive range of motion   PT Next Visit Plan PT to continue for 5 more visits weeks focusing on balance.    PT Home Exercise Plan No updates this session   Consulted and Agree with Plan of Care Patient      Patient will benefit from skilled therapeutic intervention in order to improve the following deficits and impairments:  Abnormal gait, Decreased endurance, Hypomobility, Decreased activity tolerance, Decreased strength, Decreased balance, Decreased mobility, Difficulty walking, Improper body mechanics, Decreased coordination, Decreased safety awareness,  Postural dysfunction, Decreased range of motion, Pain  Visit Diagnosis: Muscle weakness (generalized)  Other abnormalities of gait and mobility  Posture abnormality  Difficulty in walking, not elsewhere classified     Problem List There are no active problems to display for this patient.   4:34 PM,01/10/2016 Elly Modena PT, DPT [facility] [place] [address] [place], [place], [address] Phone: [phone]   Fax:  [phone]  Name: ZACARI STIFF MRN: [id number] Date of Birth: 08/28/1971

## 2016-01-14 ENCOUNTER — Ambulatory Visit (HOSPITAL_COMMUNITY): Payer: Medicare Other | Admitting: Physical Therapy

## 2016-01-16 ENCOUNTER — Ambulatory Visit (HOSPITAL_COMMUNITY): Payer: Medicare Other

## 2016-01-21 ENCOUNTER — Ambulatory Visit (HOSPITAL_COMMUNITY): Payer: Medicare Other

## 2016-01-21 DIAGNOSIS — R293 Abnormal posture: Secondary | ICD-10-CM

## 2016-01-21 DIAGNOSIS — R29898 Other symptoms and signs involving the musculoskeletal system: Secondary | ICD-10-CM | POA: Diagnosis not present

## 2016-01-21 DIAGNOSIS — M6281 Muscle weakness (generalized): Secondary | ICD-10-CM

## 2016-01-21 DIAGNOSIS — R262 Difficulty in walking, not elsewhere classified: Secondary | ICD-10-CM

## 2016-01-21 DIAGNOSIS — R2689 Other abnormalities of gait and mobility: Secondary | ICD-10-CM

## 2016-01-21 NOTE — Therapy (Addendum)
[place] [address] [place], [place], [address] Phone: [phone]   Fax:  [phone]  Physical Therapy Treatment (G-codes done)  Patient Details  Name: Vincent Anthony MRN: [id number] Date of Birth: 25-Sep-1971 Referring Provider: Dr. Ala Bent  Encounter Date: 01/21/2016      PT End of Session - 01/21/16 1659    Visit Number 17   Number of Visits 20   Date for PT Re-Evaluation 02/06/16   Authorization Type medicare   Authorization Time Period 6/76/72 end cert   Authorization - Visit Number 90   Authorization - Number of Visits 27   PT Start Time 1605   PT Stop Time 1654   PT Time Calculation (min) 49 min   Equipment Utilized During Treatment Gait belt   Activity Tolerance Patient tolerated treatment well   Behavior During Therapy [facility] for tasks assessed/performed      Past Medical History  Diagnosis Date  . MS (multiple sclerosis) ([place])   . Headache   . Tremor     Past Surgical History  Procedure Laterality Date  . Knee surgery    . Hernia repair    . Testicle removal      There were no vitals filed for this visit.      Subjective Assessment - 01/21/16 1609    Subjective Pt stated he is a little sore, achey and stiff Bil ankles and knees today, pain scale 3/10   Pertinent History Patient reports that he has had more problems with his balance and has really gotten weak in his core; has had to walk with a walker recently. Has also gotten an AFO to help keep his R knee from hyperextending. Patient reports he does not feel like it is not working the way he wants it to; in process of tracking down orthotist to get another adjustment to his brace.    Patient Stated Goals be able to transfer with more ease, walk better, better balance    Currently in Pain? Yes   Pain Score 3    Pain Location Leg   Pain Orientation Right;Left   Pain Descriptors / Indicators Tightness  stiffness                               Balance Exercises - 01/21/16 1717    Balance Exercises: Standing   Tandem Stance Eyes open;2 reps;30 secs   Sidestepping 3 reps  infront of mat   Marching Limitations 10   Sit to Stand Time no UE 3x 5 with cueing for eccentric control   Other Standing Exercises step up training leading with Lt foot 10x             PT Short Term Goals - 01/10/16 1446    PT SHORT TERM GOAL #1   Title Patient to be able to perform rolling from supine to either side with correct sequencing and only min guard in order to increase independence in mobility at home    Time 4   Period Weeks   Status Achieved   PT SHORT TERM GOAL #2   Title Patient to be able to perform sit to stand and stand to sit from chairs of various heights with correct sequencing and minimal unsteadiness in order to enhance safety and independence in mobility    Baseline Pt with unsteadiness going from stand to sit, often crashing into the chair. Difficulty getting up out  of low chairs   Time 4   Period Weeks   Status On-going   PT SHORT TERM GOAL #3   Title Patient to demonstrate improved gait mechanics including reduced toe drag/scuff R LE, improved stance/swing times both LEs, improved posture during gait, improved gait speed, and safe use of AD,  with cues no mroe than 25% of the time in order to improve efficiency of mobility and safety in gait    Time 4   Period Weeks   Status Achieved   PT SHORT TERM GOAL #4   Title Patient to be independend in wheelchair management and navigation in order to enhance mobility and independence in community    Baseline Pt is not using the wheelchair due to limited space    Time 4   Status Not Met   PT SHORT TERM GOAL #5   Title Patient to be consistent in correctly performing approprate HEP, to be updated PRN    Time 4   Period Weeks   Status On-going           PT Long Term Goals - 01/10/16 1448    PT LONG TERM GOAL #1   Title  Patient to demonstrate strength 4-/5 in all tested muscle groups, especally hip extensors and abductors, in order to enhance mobility skills and reduce fall risk    Time 8   Period Weeks   Status On-going   PT LONG TERM GOAL #2   Title Patient to be able to perform all bed mobility and functional transfers with mod(I) and rolling walker in order to enhance independence in mobilty at home with minimal fall risk    Baseline 2/5   Time 8   Period Weeks   Status On-going   PT LONG TERM GOAL #3   Title Patient to report no falls within the past 4 weeks in order to demosntrate improved safety at home and minimal risk of injury with mobility    Baseline 12/02/15* fall one week ago; 01/09/13: no falls in the past 3-4 weeks   Time 8   Period Weeks   Status On-going   PT LONG TERM GOAL #4   Title Patient to be able to perform TUG test in 1:45 seconds or less with rolling walker in order to demonstrate improved general mobility and balance    Status Revised  from 20 seconds    PT LONG TERM GOAL #5   Title Patient to maintain correct posture 80% of the time when sitting on all surfaces in order to demonstrate improved core strength and improved body awareness for mobilty    Time 8   Period Weeks   Status On-going               Plan - 01/21/16 1700    Clinical Impression Statement Session focus on improving balance training and functional strengthening to improve proximal musculature strengthening to improve eccentric control with sit to stand transfers.  Pt continues to demonstrate limited Rt hip flexor activation which is making it difficulty for him to advance his leg during activity  Balance is improving with abiltiy to stabilize partial tandem stance with min guard and no UE A.  Added marching and step up training for functional strengthening.  FOTO complete with improved percieved functional ability based on status score.  End of session no reports of increased pain, was limited by fatigue.      Rehab Potential Good   Clinical Impairments Affecting Rehab Potential ongoing MS and  complications thereof    PT Frequency 2x / week   PT Duration 6 weeks   PT Treatment/Interventions ADLs/Self Care Home Management;DME Instruction;Gait training;Stair training;Functional mobility training;Therapeutic activities;Therapeutic exercise;Balance training;Neuromuscular re-education;Patient/family education;Wheelchair mobility training;Manual techniques;Energy conservation;Electrical Stimulation;Cryotherapy;Moist Heat;Orthotic Fit/Training;Passive range of motion   PT Next Visit Plan Continue focus on balance for 3 more sessions.        Patient will benefit from skilled therapeutic intervention in order to improve the following deficits and impairments:  Abnormal gait, Decreased endurance, Hypomobility, Decreased activity tolerance, Decreased strength, Decreased balance, Decreased mobility, Difficulty walking, Improper body mechanics, Decreased coordination, Decreased safety awareness, Postural dysfunction, Decreased range of motion, Pain  Visit Diagnosis: Muscle weakness (generalized)  Other abnormalities of gait and mobility  Posture abnormality  Difficulty in walking, not elsewhere classified    Problem List There are no active problems to display for this patient.  [place], [place]; Delana Meyer [phone]      G-Codes - 02-08-2016 1751    Functional Assessment Tool Used FOTO 48% limited    Functional Limitation Mobility: Walking and moving around   Mobility: Walking and Moving Around Current Status [phone]) At least 40 percent but less than 60 percent impaired, limited or restricted   Mobility: Walking and Moving Around Goal Status [phone]) At least 40 percent but less than 60 percent impaired, limited or restricted      Deniece Ree PT, DPT [place], [place], [address] Phone: [phone]   Fax:   [phone]  Name: Vincent Anthony MRN: [id number] Date of Birth: Feb 11, 1971

## 2016-01-22 ENCOUNTER — Ambulatory Visit (HOSPITAL_COMMUNITY): Payer: Medicare Other | Admitting: Physical Therapy

## 2016-01-23 ENCOUNTER — Ambulatory Visit (HOSPITAL_COMMUNITY): Payer: Medicare Other | Admitting: Physical Therapy

## 2016-01-23 ENCOUNTER — Ambulatory Visit (HOSPITAL_COMMUNITY): Payer: Medicare Other

## 2016-01-23 DIAGNOSIS — R2689 Other abnormalities of gait and mobility: Secondary | ICD-10-CM

## 2016-01-23 DIAGNOSIS — R29898 Other symptoms and signs involving the musculoskeletal system: Secondary | ICD-10-CM | POA: Diagnosis not present

## 2016-01-23 DIAGNOSIS — M6281 Muscle weakness (generalized): Secondary | ICD-10-CM

## 2016-01-23 DIAGNOSIS — R293 Abnormal posture: Secondary | ICD-10-CM

## 2016-01-23 DIAGNOSIS — R262 Difficulty in walking, not elsewhere classified: Secondary | ICD-10-CM

## 2016-01-23 NOTE — Therapy (Signed)
[place], [place], [address] Phone: [phone]   Fax:  [phone]  Physical Therapy Treatment  Patient Details  Name: Vincent Anthony MRN: [id number] Date of Birth: 1971/04/05 Referring Provider: Dr. Ala Bent  Encounter Date: 01/23/2016      PT End of Session - 01/23/16 1554    Visit Number 18   Number of Visits 20   Date for PT Re-Evaluation 02/06/16   Authorization Type medicare   Authorization Time Period 12/10/95 end cert   Authorization - Visit Number 18   Authorization - Number of Visits 27   PT Start Time 1518   PT Stop Time 1610   PT Time Calculation (min) 52 min   Equipment Utilized During Treatment Gait belt   Activity Tolerance Patient tolerated treatment well   Behavior During Therapy [facility] for tasks assessed/performed      Past Medical History  Diagnosis Date  . MS (multiple sclerosis) ([place])   . Headache   . Tremor     Past Surgical History  Procedure Laterality Date  . Knee surgery    . Hernia repair    . Testicle removal      There were no vitals filed for this visit.      Subjective Assessment - 01/23/16 1539    Subjective Pt stated Bil knee and ankles are sore and achey, pain scale 2.5/10.   Pertinent History Patient reports that he has had more problems with his balance and has really gotten weak in his core; has had to walk with a walker recently. Has also gotten an AFO to help keep his R knee from hyperextending. Patient reports he does not feel like it is not working the way he wants it to; in process of tracking down orthotist to get another adjustment to his brace.    Patient Stated Goals be able to transfer with more ease, walk better, better balance    Currently in Pain? Yes   Pain Score --  2.5/10 Bil LE   Pain Location Leg   Pain Orientation Right;Left   Pain Descriptors / Indicators Aching;Sore   Pain Type Chronic pain   Pain Radiating Towards small amount  of radiation noted up and down from painful joints    Pain Onset More than a month ago   Aggravating Factors  walking    Pain Relieving Factors resting   Effect of Pain on Daily Activities limited weight bearing due to pain in joints            [facility] Adult PT Treatment/Exercise - 01/23/16 0001    Ambulation/Gait   Ambulation/Gait Assistance 5: Supervision   Ambulation Distance (Feet) 100 Feet   Assistive device Rolling walker   Gait Pattern Step-to pattern;Decreased hip/knee flexion - right;Right hip hike;Poor foot clearance - right   Ambulation Surface Level   Gait velocity slow   Gait Comments verbal cuing to increase stride length    Knee/Hip Exercises: Aerobic   Nustep L2 UE and LE x 10 min (no charge)             Balance Exercises - 01/23/16 1602    Balance Exercises: Standing   Tandem Stance Eyes open;3 reps;30 secs   Sidestepping 3 reps  inside // bars no UE A   Sit to Stand Time no UE 3x 5 with cueing for eccentric control             PT Short Term Goals -  01/10/16 1446    PT SHORT TERM GOAL #1   Title Patient to be able to perform rolling from supine to either side with correct sequencing and only min guard in order to increase independence in mobility at home    Time 4   Period Weeks   Status Achieved   PT SHORT TERM GOAL #2   Title Patient to be able to perform sit to stand and stand to sit from chairs of various heights with correct sequencing and minimal unsteadiness in order to enhance safety and independence in mobility    Baseline Pt with unsteadiness going from stand to sit, often crashing into the chair. Difficulty getting up out of low chairs   Time 4   Period Weeks   Status On-going   PT SHORT TERM GOAL #3   Title Patient to demonstrate improved gait mechanics including reduced toe drag/scuff R LE, improved stance/swing times both LEs, improved posture during gait, improved gait speed, and safe use of AD,  with cues no mroe than 25% of the  time in order to improve efficiency of mobility and safety in gait    Time 4   Period Weeks   Status Achieved   PT SHORT TERM GOAL #4   Title Patient to be independend in wheelchair management and navigation in order to enhance mobility and independence in community    Baseline Pt is not using the wheelchair due to limited space    Time 4   Status Not Met   PT SHORT TERM GOAL #5   Title Patient to be consistent in correctly performing approprate HEP, to be updated PRN    Time 4   Period Weeks   Status On-going           PT Long Term Goals - 01/10/16 1448    PT LONG TERM GOAL #1   Title Patient to demonstrate strength 4-/5 in all tested muscle groups, especally hip extensors and abductors, in order to enhance mobility skills and reduce fall risk    Time 8   Period Weeks   Status On-going   PT LONG TERM GOAL #2   Title Patient to be able to perform all bed mobility and functional transfers with mod(I) and rolling walker in order to enhance independence in mobilty at home with minimal fall risk    Baseline 2/5   Time 8   Period Weeks   Status On-going   PT LONG TERM GOAL #3   Title Patient to report no falls within the past 4 weeks in order to demosntrate improved safety at home and minimal risk of injury with mobility    Baseline 12/02/15* fall one week ago; 01/09/13: no falls in the past 3-4 weeks   Time 8   Period Weeks   Status On-going   PT LONG TERM GOAL #4   Title Patient to be able to perform TUG test in 1:45 seconds or less with rolling walker in order to demonstrate improved general mobility and balance    Status Revised  from 20 seconds    PT LONG TERM GOAL #5   Title Patient to maintain correct posture 80% of the time when sitting on all surfaces in order to demonstrate improved core strength and improved body awareness for mobilty    Time 8   Period Weeks   Status On-going               Plan - 01/23/16 1555    Clinical Impression  Statement Continued  session focus on improving balance training and functional strengthening with proximal musculature strengtheing to improve functional sit to stand transfers.  Noted increased musculature fatigue with Rt LE this session with standing activities with increased difficulty with sidestepping today.  Moderate cueing required to increase stride length for energy conservation and to improve gait mechanics through session.  Ended session with Nustep to improve activity tolerance and improve sequence with UE and LE.  No reports of pain through session.     Rehab Potential Good   Clinical Impairments Affecting Rehab Potential ongoing MS and complications thereof    PT Frequency 2x / week   PT Duration 6 weeks   PT Treatment/Interventions ADLs/Self Care Home Management;DME Instruction;Gait training;Stair training;Functional mobility training;Therapeutic activities;Therapeutic exercise;Balance training;Neuromuscular re-education;Patient/family education;Wheelchair mobility training;Manual techniques;Energy conservation;Electrical Stimulation;Cryotherapy;Moist Heat;Orthotic Fit/Training;Passive range of motion   PT Next Visit Plan Continue focus on balance for 2 more sessions.        Patient will benefit from skilled therapeutic intervention in order to improve the following deficits and impairments:  Abnormal gait, Decreased endurance, Hypomobility, Decreased activity tolerance, Decreased strength, Decreased balance, Decreased mobility, Difficulty walking, Improper body mechanics, Decreased coordination, Decreased safety awareness, Postural dysfunction, Decreased range of motion, Pain  Visit Diagnosis: Muscle weakness (generalized)  Other abnormalities of gait and mobility  Posture abnormality  Difficulty in walking, not elsewhere classified       G-Codes - 02-10-16 1751    Functional Assessment Tool Used FOTO 48% limited    Functional Limitation Mobility: Walking and moving around   Mobility: Walking  and Moving Around Current Status [phone]) At least 40 percent but less than 60 percent impaired, limited or restricted   Mobility: Walking and Moving Around Goal Status [phone]) At least 40 percent but less than 60 percent impaired, limited or restricted      Problem List There are no active problems to display for this patient.  [address] LPTA; [place]  Aldona Lento 01/23/2016, 4:18 PM  [place] [address] [place], [place], [address] Phone: [phone]   Fax:  [phone]  Name: Vincent Anthony MRN: [id number] Date of Birth: [birth year]

## 2016-01-29 ENCOUNTER — Ambulatory Visit (HOSPITAL_COMMUNITY): Payer: Medicare Other | Attending: Neurology

## 2016-01-29 DIAGNOSIS — R262 Difficulty in walking, not elsewhere classified: Secondary | ICD-10-CM | POA: Diagnosis present

## 2016-01-29 DIAGNOSIS — R293 Abnormal posture: Secondary | ICD-10-CM | POA: Diagnosis present

## 2016-01-29 DIAGNOSIS — R278 Other lack of coordination: Secondary | ICD-10-CM | POA: Insufficient documentation

## 2016-01-29 DIAGNOSIS — M6281 Muscle weakness (generalized): Secondary | ICD-10-CM | POA: Diagnosis not present

## 2016-01-29 DIAGNOSIS — R2689 Other abnormalities of gait and mobility: Secondary | ICD-10-CM | POA: Diagnosis present

## 2016-01-29 NOTE — Therapy (Signed)
[place], [place], [address] Phone: [phone]   Fax:  [phone]  Physical Therapy Treatment  Patient Details  Name: Vincent Anthony MRN: [id number] Date of Birth: August 27, 1971 Referring Provider: Dr. Ala Bent  Encounter Date: 01/29/2016      PT End of Session - 01/29/16 1526    Visit Number 19   Number of Visits 20   Date for PT Re-Evaluation 02/06/16   Authorization Type medicare   Authorization Time Period 1/66/06 end [place] - Visit Number 19   Authorization - Number of Visits 27   PT Start Time 1520   PT Stop Time 1603   PT Time Calculation (min) 43 min   Equipment Utilized During Treatment Gait belt   Activity Tolerance Patient tolerated treatment well;No increased pain;Patient limited by fatigue   Behavior During Therapy [facility] for tasks assessed/performed      Past Medical History  Diagnosis Date  . MS (multiple sclerosis) ([place])   . Headache   . Tremor     Past Surgical History  Procedure Laterality Date  . Knee surgery    . Hernia repair    . Testicle removal      There were no vitals filed for this visit.      Subjective Assessment - 01/29/16 1525    Subjective Pt stated 2 days ago he has increased pain Rt thumb with difficutly to extend thumb, pain scale 3/10   Pertinent History Patient reports that he has had more problems with his balance and has really gotten weak in his core; has had to walk with a walker recently. Has also gotten an AFO to help keep his R knee from hyperextending. Patient reports he does not feel like it is not working the way he wants it to; in process of tracking down orthotist to get another adjustment to his brace.    Patient Stated Goals be able to transfer with more ease, walk better, better balance    Currently in Pain? Yes   Pain Score 3    Pain Location Finger (Comment which one)                         [facility] Adult PT  Treatment/Exercise - 01/29/16 0001    Ambulation/Gait   Ambulation/Gait Assistance 5: Supervision   Ambulation Distance (Feet) 100 Feet   Assistive device Rolling walker   Gait Pattern Step-to pattern;Decreased hip/knee flexion - right;Right hip hike;Poor foot clearance - right   Ambulation Surface Level   Gait velocity slow   Gait Comments cueing to advance Rt LE   Knee/Hip Exercises: Standing   Forward Lunges Both;10 reps   Forward Lunges Limitations 2in step   Functional Squat 10 reps             Balance Exercises - 01/29/16 1556    Balance Exercises: Standing   Tandem Stance Eyes open;3 reps;30 secs;1 rep;Foam/compliant surface   Sidestepping 3 reps  inside // bars no UE A   Marching Limitations 10   Sit to Stand Time no UE 3x 5 with cueing for eccentric control             PT Short Term Goals - 01/10/16 1446    PT SHORT TERM GOAL #1   Title Patient to be able to perform rolling from supine to either side with correct sequencing and only min guard in order to increase independence  in mobility at home    Time 4   Period Weeks   Status Achieved   PT SHORT TERM GOAL #2   Title Patient to be able to perform sit to stand and stand to sit from chairs of various heights with correct sequencing and minimal unsteadiness in order to enhance safety and independence in mobility    Baseline Pt with unsteadiness going from stand to sit, often crashing into the chair. Difficulty getting up out of low chairs   Time 4   Period Weeks   Status On-going   PT SHORT TERM GOAL #3   Title Patient to demonstrate improved gait mechanics including reduced toe drag/scuff R LE, improved stance/swing times both LEs, improved posture during gait, improved gait speed, and safe use of AD,  with cues no mroe than 25% of the time in order to improve efficiency of mobility and safety in gait    Time 4   Period Weeks   Status Achieved   PT SHORT TERM GOAL #4   Title Patient to be independend in  wheelchair management and navigation in order to enhance mobility and independence in community    Baseline Pt is not using the wheelchair due to limited space    Time 4   Status Not Met   PT SHORT TERM GOAL #5   Title Patient to be consistent in correctly performing approprate HEP, to be updated PRN    Time 4   Period Weeks   Status On-going           PT Long Term Goals - 01/10/16 1448    PT LONG TERM GOAL #1   Title Patient to demonstrate strength 4-/5 in all tested muscle groups, especally hip extensors and abductors, in order to enhance mobility skills and reduce fall risk    Time 8   Period Weeks   Status On-going   PT LONG TERM GOAL #2   Title Patient to be able to perform all bed mobility and functional transfers with mod(I) and rolling walker in order to enhance independence in mobilty at home with minimal fall risk    Baseline 2/5   Time 8   Period Weeks   Status On-going   PT LONG TERM GOAL #3   Title Patient to report no falls within the past 4 weeks in order to demosntrate improved safety at home and minimal risk of injury with mobility    Baseline 12/02/15* fall one week ago; 01/09/13: no falls in the past 3-4 weeks   Time 8   Period Weeks   Status On-going   PT LONG TERM GOAL #4   Title Patient to be able to perform TUG test in 1:45 seconds or less with rolling walker in order to demonstrate improved general mobility and balance    Status Revised  from 20 seconds    PT LONG TERM GOAL #5   Title Patient to maintain correct posture 80% of the time when sitting on all surfaces in order to demonstrate improved core strength and improved body awareness for mobilty    Time 8   Period Weeks   Status On-going               Plan - 01/29/16 1758    Clinical Impression Statement Session focus on functional strenghtening to proximal musculatyre to improve gait training and balance training.  Added forwards lunges (assistance required to place Rt LE onto 2in step)  for stabiltiy with minimal UE A.  Progressed balance activities to partial tandem stance on dynamic surface inside// bars for safety with min to mod A required, tendency to lean to Rt due to weakness.  No reports of increased pain through session, noted visible musculature fatigue following activities.  Pt continues to required verbal cueing to advamce Rt LE during gait with RW.     Rehab Potential Good   Clinical Impairments Affecting Rehab Potential ongoing MS and complications thereof    PT Frequency 2x / week   PT Duration 6 weeks   PT Treatment/Interventions ADLs/Self Care Home Management;DME Instruction;Gait training;Stair training;Functional mobility training;Therapeutic activities;Therapeutic exercise;Balance training;Neuromuscular re-education;Patient/family education;Wheelchair mobility training;Manual techniques;Energy conservation;Electrical Stimulation;Cryotherapy;Moist Heat;Orthotic Fit/Training;Passive range of motion   PT Next Visit Plan Reassess next session.        Patient will benefit from skilled therapeutic intervention in order to improve the following deficits and impairments:  Abnormal gait, Decreased endurance, Hypomobility, Decreased activity tolerance, Decreased strength, Decreased balance, Decreased mobility, Difficulty walking, Improper body mechanics, Decreased coordination, Decreased safety awareness, Postural dysfunction, Decreased range of motion, Pain  Visit Diagnosis: Muscle weakness (generalized)  Other abnormalities of gait and mobility  Posture abnormality  Difficulty in walking, not elsewhere classified     Problem List There are no active problems to display for this patient.  [address] LPTA; [place]  Aldona Lento 01/29/2016, 6:10 PM  [place] [address] [place], [place], [address] Phone: [phone]   Fax:  [phone]  Name: Vincent Anthony MRN: [id number] Date of  Birth: 1971-05-23

## 2016-02-05 ENCOUNTER — Ambulatory Visit (HOSPITAL_COMMUNITY): Payer: Medicare Other | Admitting: Physical Therapy

## 2016-02-13 ENCOUNTER — Ambulatory Visit (HOSPITAL_COMMUNITY): Payer: Medicare Other | Admitting: Physical Therapy

## 2016-02-13 DIAGNOSIS — R262 Difficulty in walking, not elsewhere classified: Secondary | ICD-10-CM

## 2016-02-13 DIAGNOSIS — R293 Abnormal posture: Secondary | ICD-10-CM

## 2016-02-13 DIAGNOSIS — R278 Other lack of coordination: Secondary | ICD-10-CM

## 2016-02-13 DIAGNOSIS — R2689 Other abnormalities of gait and mobility: Secondary | ICD-10-CM

## 2016-02-13 DIAGNOSIS — M6281 Muscle weakness (generalized): Secondary | ICD-10-CM | POA: Diagnosis not present

## 2016-02-13 NOTE — Therapy (Signed)
[facility] [address] [place], [place], [address] Phone: [phone]   Fax:  [phone]  Physical Therapy Treatment (Discharge)  Patient Details  Name: Vincent Anthony MRN: [id number] Date of Birth: June 16, 1971 Referring Provider: Harlen Labs   Encounter Date: 02/13/2016      PT End of Session - 02/13/16 1143    Visit Number 20   Number of Visits 20   Authorization Type medicare   Authorization - Visit Number 20   Authorization - Number of Visits 27   PT Start Time 0947   PT Stop Time 1027   PT Time Calculation (min) 40 min   Activity Tolerance Patient tolerated treatment well   Behavior During Therapy [facility] for tasks assessed/performed      Past Medical History  Diagnosis Date  . MS (multiple sclerosis) ([facility])   . Headache   . Tremor     Past Surgical History  Procedure Laterality Date  . Knee surgery    . Hernia repair    . Testicle removal      There were no vitals filed for this visit.      Subjective Assessment - 02/13/16 0954    Subjective patient reports that he is still torn up overa  death in the family; he reports that he is on a new medication from his dermatologist. He is having an easier time walking and sit to stand, moving side to sdie is easier; he is still having trouble with his thumb and reports that an urgent care MD told him it is actually arthritis and inflamed. he has been trying to work it but it is still sore. No falls or close calls recently. He reports that sit to stand is still dificult for him; he rates himself sa being at around 50-60% in compairson where he'd like to be.    Pertinent History Patient reports that he has had more problems with his balance and has really gotten weak in his core; has had to walk with a walker recently. Has also gotten an AFO to help keep his R knee from hyperextending. Patient reports he does not feel like it is not working the way he wants it to; in process of  tracking down orthotist to get another adjustment to his brace.    Patient Stated Goals be able to transfer with more ease, walk better, better balance    Currently in Pain? Yes   Pain Score 3    Pain Location Other (Comment)  general pain   Pain Orientation Other (Comment)  all over    Pain Descriptors / Indicators Aching   Pain Type Chronic pain   Pain Radiating Towards none    Pain Onset More than a month ago   Pain Frequency Constant   Aggravating Factors  walking    Pain Relieving Factors resting    Effect of Pain on Daily Activities limited weight bearing due to pain in joints    Multiple Pain Sites No            [facility] PT Assessment - 02/13/16 0001    Assessment   Medical Diagnosis multiple sclerosis    Referring Provider [facility]    Prior Therapy recieved PT in early 2016 for MS at this clinic    Precautions   Precautions Fall   Balance Screen   Has the patient fallen in the past 6 months Yes   How many times? 2   Has the patient had  a decrease in activity level because of a fear of falling?  Yes   Is the patient reluctant to leave their home because of a fear of falling?  Yes   Prior Function   Level of Independence Independent with basic ADLs;Independent with gait;Independent with transfers   Vocation Part time employment   Vocation Requirements answering phones, filing   Leisure weight lifting, working out   Strength   Right Hip Extension 2/5   Right Hip ABduction 2/5   Left Hip Extension 2/5   Left Hip ABduction 2+/5   Right Knee Flexion 4+/5   Right Knee Extension 4-/5   Left Knee Flexion 2+/5   Left Knee Extension 5/5   Right Ankle Dorsiflexion 0/5   Left Ankle Dorsiflexion 5/5   Bed Mobility   Rolling Right 6: Modified independent (Device/Increase time)   Rolling Left 6: Modified independent (Device/Increase time)   Supine to Sit 4: Min guard   Sit to Supine 4: Min guard   Transfers   Sit to Stand 6: Modified independent (Device/Increase time)    Ambulation/Gait   Gait Comments improved clearance of R LE, however reduced gait speed and continues vaulting L ankle    Timed Up and Go Test   Normal TUG (seconds) 1.27  RW                              PT Education - 02/13/16 1143    Education provided Yes   Education Details progress wtih skilled PT services, DC today    Person(s) Educated Patient   Methods Explanation   Comprehension Verbalized understanding          PT Short Term Goals - 02/13/16 1014    PT SHORT TERM GOAL #1   Title Patient to be able to perform rolling from supine to either side with correct sequencing and only min guard in order to increase independence in mobility at home    Time 4   Period Weeks   Status Achieved   PT SHORT TERM GOAL #2   Title Patient to be able to perform sit to stand and stand to sit from chairs of various heights with correct sequencing and minimal unsteadiness in order to enhance safety and independence in mobility    Baseline 5/18- continues to have some difficulty controlling descent/unsteadiness with sit to stand, but this is improved    Time 4   Period Weeks   Status Partially Met   PT SHORT TERM GOAL #3   Title Patient to demonstrate improved gait mechanics including reduced toe drag/scuff R LE, improved stance/swing times both LEs, improved posture during gait, improved gait speed, and safe use of AD,  with cues no mroe than 25% of the time in order to improve efficiency of mobility and safety in gait    Time 4   Period Weeks   Status Achieved   PT SHORT TERM GOAL #4   Title Patient to be independend in wheelchair management and navigation in order to enhance mobility and independence in community    Baseline 5/18- they use WC from time to time, but wife has been pushing him; reports that they may be moving soon to a bigger space where he can use the  custom chair more    Time 4   Period Weeks   Status Deferred   PT SHORT TERM GOAL #5   Title  Patient to be consistent in correctly  performing approprate HEP, to be updated PRN    Baseline 5/18- goes to gym 3-4 times per week, also doing his HEP at home    Time 4   Period Weeks   Status Achieved           PT Long Term Goals - 02/13/16 1018    PT LONG TERM GOAL #1   Title Patient to demonstrate strength 4-/5 in all tested muscle groups, especally hip extensors and abductors, in order to enhance mobility skills and reduce fall risk    Time 8   Period Weeks   Status On-going   PT LONG TERM GOAL #2   Title Patient to be able to perform all bed mobility and functional transfers with mod(I) and rolling walker in order to enhance independence in mobilty at home with minimal fall risk    Baseline 5/18- mod(I) in majority of transfers except for supine to sit with RW and extended time    Time 8   Period Weeks   Status Partially Met   PT LONG TERM GOAL #3   Title Patient to report no falls within the past 4 weeks in order to demosntrate improved safety at home and minimal risk of injury with mobility    Baseline 5/18- no falls within the past 4 weeks    Time 8   Period Weeks   Status Achieved   PT LONG TERM GOAL #4   Title Patient to be able to perform TUG test in 1:45 seconds or less with rolling walker in order to demonstrate improved general mobility and balance    Baseline 5/18- 1:27 with RW    Time 8   Period Weeks   Status Achieved   PT LONG TERM GOAL #5   Title Patient to maintain correct posture 80% of the time when sitting on all surfaces in order to demonstrate improved core strength and improved body awareness for mobilty    Baseline 5/18- more aware of posture, finds himself correcting his posture more often    Time 8   Period Weeks   Status On-going               Plan - 02/13/16 1144    Clinical Impression Statement Re-assessment performed today. Patient shows improvements in quality of gait pattern, some improvement in gait speed, improvement in balance  as evidenced by reduction in TUG score with RW, and improved postural awareness. While he does remain limited by specific MS related impairments, such as tone, functioanl muscle weakness, and fatigue, patient reports he is very happy with his progress wtih skilled PT services, has been able to return to the gym with freinds/family, and feels that he is ready for DC today. Did not update HEP due to concern of exacerbating patient's MS secondary to fatigue since he is already quite active with working, his existing HEP, and exercising at the gym, all of which he appears to be tolerating well. DC today due to pateint request.    Rehab Potential Good   Clinical Impairments Affecting Rehab Potential ongoing MS and complications thereof    PT Treatment/Interventions ADLs/Self Care Home Management;DME Instruction;Gait training;Stair training;Functional mobility training;Therapeutic activities;Therapeutic exercise;Balance training;Neuromuscular re-education;Patient/family education;Wheelchair mobility training;Manual techniques;Energy conservation;Electrical Stimulation;Cryotherapy;Moist Heat;Orthotic Fit/Training;Passive range of motion   PT Next Visit Plan DC today   PT Home Exercise Plan no updates    Consulted and Agree with Plan of Care Patient      Patient will benefit from skilled therapeutic intervention in order  to improve the following deficits and impairments:  Abnormal gait, Decreased endurance, Hypomobility, Decreased activity tolerance, Decreased strength, Decreased balance, Decreased mobility, Difficulty walking, Improper body mechanics, Decreased coordination, Decreased safety awareness, Postural dysfunction, Decreased range of motion, Pain  Visit Diagnosis: Muscle weakness (generalized) - Plan: PT plan of care cert/re-cert  Other abnormalities of gait and mobility - Plan: PT plan of care cert/re-cert  Posture abnormality - Plan: PT plan of care cert/re-cert  Difficulty in walking, not  elsewhere classified - Plan: PT plan of care cert/re-cert  Other lack of coordination - Plan: PT plan of care cert/re-cert       G-Codes - Feb 16, 2016 1151    Functional Assessment Tool Used Based on skilled clinical assessment of strength, gait, balance, functional mobility    Functional Limitation Mobility: Walking and moving around   Mobility: Walking and Moving Around Goal Status [phone]) At least 40 percent but less than 60 percent impaired, limited or restricted   Mobility: Walking and Moving Around Discharge Status [phone]) At least 40 percent but less than 60 percent impaired, limited or restricted      Problem List There are no active problems to display for this patient.  PHYSICAL THERAPY DISCHARGE SUMMARY  Visits from Start of Care: 20  Current functional level related to goals / functional outcomes: Patient reports he is doing well right now, feels ready for DC as he is able to do what he needs and wants. Will get new MD order if he has any new problems or concerns.    Remaining deficits: Classic MS impairments; weakness, unsteadiness, fatigue, poor posture    Education / Equipment: DC today  Plan: Patient agrees to discharge.  Patient goals were partially met. Patient is being discharged due to being pleased with the current functional level.  ?????        Deniece Ree PT, DPT [place] [address] [place], [place], [address] Phone: [phone]   Fax:  [phone]  Name: Vincent Anthony MRN: [id number] Date of Birth: [birth year]

## 2016-10-06 ENCOUNTER — Encounter: Payer: Self-pay | Admitting: Internal Medicine

## 2016-10-28 ENCOUNTER — Ambulatory Visit: Payer: Medicare Other | Admitting: Nurse Practitioner

## 2016-11-03 ENCOUNTER — Encounter (HOSPITAL_COMMUNITY): Payer: Self-pay | Admitting: *Deleted

## 2016-11-03 ENCOUNTER — Emergency Department (HOSPITAL_COMMUNITY)
Admission: EM | Admit: 2016-11-03 | Discharge: 2016-11-03 | Disposition: A | Discharge status: Home / Self Care | Payer: Medicare Other | Attending: Emergency Medicine | Admitting: Emergency Medicine

## 2016-11-03 DIAGNOSIS — Z79899 Other long term (current) drug therapy: Secondary | ICD-10-CM | POA: Insufficient documentation

## 2016-11-03 DIAGNOSIS — R519 Headache, unspecified: Secondary | ICD-10-CM

## 2016-11-03 DIAGNOSIS — R110 Nausea: Secondary | ICD-10-CM | POA: Diagnosis not present

## 2016-11-03 DIAGNOSIS — R51 Headache: Secondary | ICD-10-CM | POA: Diagnosis present

## 2016-11-03 DIAGNOSIS — R109 Unspecified abdominal pain: Secondary | ICD-10-CM | POA: Insufficient documentation

## 2016-11-03 HISTORY — DX: Abnormal levels of other serum enzymes: R74.8

## 2016-11-03 LAB — COMPREHENSIVE METABOLIC PANEL
ALT: 42 U/L (ref 17–63)
AST: 36 U/L (ref 15–41)
Albumin: 4.4 g/dL (ref 3.5–5.0)
Alkaline Phosphatase: 125 U/L (ref 38–126)
Anion gap: 13 (ref 5–15)
BUN: 17 mg/dL (ref 6–20)
CO2: 26 mmol/L (ref 22–32)
Calcium: 9.7 mg/dL (ref 8.9–10.3)
Chloride: 97 mmol/L — ABNORMAL LOW (ref 101–111)
Creatinine, Ser: 1.11 mg/dL (ref 0.61–1.24)
GFR calc Af Amer: >60 mL/min (ref >60)
GFR calc non Af Amer: >60 mL/min (ref >60)
Glucose, Bld: 113 mg/dL — ABNORMAL HIGH (ref 65–99)
Potassium: 3.9 mmol/L (ref 3.5–5.1)
Sodium: 136 mmol/L (ref 135–145)
Total Bilirubin: 1 mg/dL (ref 0.3–1.2)
Total Protein: 7.4 g/dL (ref 6.5–8.1)

## 2016-11-03 LAB — URINALYSIS, ROUTINE W REFLEX MICROSCOPIC
Bilirubin Urine: NEGATIVE
Glucose, UA: NEGATIVE mg/dL
Hgb urine dipstick: NEGATIVE
Ketones, ur: 20 mg/dL — AB
Leukocytes, UA: NEGATIVE
Nitrite: NEGATIVE
Protein, ur: NEGATIVE mg/dL
Specific Gravity, Urine: 1.017 (ref 1.005–1.030)
WBC, UA: NONE SEEN WBC/hpf (ref 0–5)
pH: 5 (ref 5.0–8.0)

## 2016-11-03 LAB — CBC WITH DIFFERENTIAL/PLATELET
Basophils Absolute: 0 10*3/uL (ref 0.0–0.1)
Basophils Relative: 0 %
Eosinophils Absolute: 0 10*3/uL (ref 0.0–0.7)
Eosinophils Relative: 0 %
HCT: 44 % (ref 39.0–52.0)
Hemoglobin: 15 g/dL (ref 13.0–17.0)
Lymphocytes Relative: 3 %
Lymphs Abs: 0.2 10*3/uL — ABNORMAL LOW (ref 0.7–4.0)
MCH: 29 pg (ref 26.0–34.0)
MCHC: 34.1 g/dL (ref 30.0–36.0)
MCV: 85.1 fL (ref 78.0–100.0)
Monocytes Absolute: 0.3 10*3/uL (ref 0.1–1.0)
Monocytes Relative: 4 %
Neutro Abs: 6.4 10*3/uL (ref 1.7–7.7)
Neutrophils Relative %: 93 %
Platelets: 179 10*3/uL (ref 150–400)
RBC: 5.17 MIL/uL (ref 4.22–5.81)
RDW: 14.1 % (ref 11.5–15.5)
WBC: 6.9 10*3/uL (ref 4.0–10.5)

## 2016-11-03 MED ORDER — DIPHENHYDRAMINE HCL 50 MG/ML IJ SOLN
25.0000 mg | Freq: Once | INTRAMUSCULAR | Status: AC
  Administered 2016-11-03: 25 mg via INTRAVENOUS
  Filled 2016-11-03: qty 1

## 2016-11-03 MED ORDER — ONDANSETRON 4 MG PO TBDP: 4.0000 mg | ORAL_TABLET | Freq: Three times a day (TID) | ORAL | 0 refills | Status: DC | PRN

## 2016-11-03 MED ORDER — PROCHLORPERAZINE EDISYLATE 5 MG/ML IJ SOLN
10.0000 mg | Freq: Once | INTRAMUSCULAR | Status: AC
  Administered 2016-11-03: 10 mg via INTRAVENOUS
  Filled 2016-11-03: qty 2

## 2016-11-03 MED ORDER — ACETAMINOPHEN 500 MG PO TABS
1000.0000 mg | ORAL_TABLET | Freq: Once | ORAL | Status: AC
  Administered 2016-11-03: 1000 mg via ORAL
  Filled 2016-11-03: qty 2

## 2016-11-03 MED ORDER — ONDANSETRON HCL 4 MG/2ML IJ SOLN
4.0000 mg | Freq: Once | INTRAMUSCULAR | Status: AC
  Administered 2016-11-03: 4 mg via INTRAVENOUS
  Filled 2016-11-03: qty 2

## 2016-11-03 MED ORDER — SODIUM CHLORIDE 0.9 % IV BOLUS (SEPSIS)
1000.0000 mL | Freq: Once | INTRAVENOUS | Status: AC
  Administered 2016-11-03: 1000 mL via INTRAVENOUS

## 2016-11-03 NOTE — ED Provider Notes (Signed)
[facility] DEPT Provider Note   CSN: [id number] Arrival date & time: 11/03/16  0140     History   Chief Complaint Chief Complaint  Patient presents with  . Generalized Body Aches    HPI Vincent Anthony is a 46 y.o. male.  HPI  This is a 46 year old male with a history of MS who presents with headache, nausea, generalized bodyaches. Patient reports onset of symptoms yesterday. Reports temporal headache which is 8 out of 10 in severity. Reports a history of headaches with similar symptoms but not "this bad." Denies neck pain or fevers. Reports nausea without vomiting. States that he took Tylenol with minimal relief. Also reports generalized bodyaches and "not feeling well." Denies chest pain, shortness breath, cough, upper respiratory symptoms.  Does report right flank pain. No urinary symptoms.  Denies vision changes, new weakness, numbness. Past Medical History:  Diagnosis Date  . Elevated liver enzymes   . Headache   . MS (multiple sclerosis) (HCC)   . Tremor     There are no active problems to display for this patient.   Past Surgical History:  Procedure Laterality Date  . HERNIA REPAIR    . KNEE SURGERY    . TESTICLE REMOVAL         Home Medications    Prior to Admission medications   Medication Sig Start Date End Date Taking? Authorizing Provider  amitriptyline (ELAVIL) 10 MG tablet Take 1 tablet by mouth at bedtime.  11/09/14  Yes Historical Provider, MD  baclofen (LIORESAL) 10 MG tablet Take 1 tablet by mouth 3 (three) times daily. 11/09/14  Yes Historical Provider, MD  cetirizine (ZYRTEC) 10 MG tablet Take 10 mg by mouth daily.   Yes Historical Provider, MD  clonazePAM (KLONOPIN) 1 MG tablet Take 1 tablet by mouth 3 (three) times daily as needed for anxiety.  10/30/14  Yes Historical Provider, MD  Coenzyme Q10 (CO Q 10) 100 MG CAPS Take 1 tablet by mouth every morning.   Yes Historical Provider, MD  cyclobenzaprine (FLEXERIL) 10 MG tablet Take 10 mg by mouth  3 (three) times daily as needed for muscle spasms.   Yes Historical Provider, MD  fluticasone (FLONASE) 50 MCG/ACT nasal spray Place 2 sprays into both nostrils daily as needed for allergies or rhinitis.   Yes Historical Provider, MD  gabapentin (NEURONTIN) 300 MG capsule Take 1 capsule by mouth 3 (three) times daily. 11/09/14  Yes Historical Provider, MD  GILENYA 0.5 MG CAPS Take 1 capsule by mouth daily. 11/16/14  Yes Historical Provider, MD  ibuprofen (ADVIL,MOTRIN) 200 MG tablet Take 400 mg by mouth every 6 (six) hours as needed for moderate pain.   Yes Historical Provider, MD  levETIRAcetam (KEPPRA) 250 MG tablet Take 2 tablets by mouth 3 (three) times daily.  11/09/14  Yes Historical Provider, MD  methylphenidate (RITALIN) 10 MG tablet Take 1 tablet by mouth daily. 11/02/14  Yes Historical Provider, MD  Multiple Vitamin (MULTIVITAMIN WITH MINERALS) TABS tablet Take 1 tablet by mouth daily.   Yes Historical Provider, MD  pravastatin (PRAVACHOL) 20 MG tablet Take 20 mg by mouth daily.   Yes Historical Provider, MD  traMADol (ULTRAM) 50 MG tablet Take 50-100 mg by mouth at bedtime as needed for moderate pain.  10/27/14  Yes Historical Provider, MD  VESICARE 10 MG tablet Take 1 tablet by mouth daily. 11/09/14  Yes Historical Provider, MD  HYDROcodone-acetaminophen (NORCO/VICODIN) 5-325 MG per tablet Take 1 tablet by mouth every 4 (four) hours as  needed. Patient not taking: Reported on 11/08/2015 03/04/13   Janne Napoleon, NP  ondansetron (ZOFRAN ODT) 4 MG disintegrating tablet Take 1 tablet (4 mg total) by mouth every 8 (eight) hours as needed for nausea or vomiting. 11/03/16   Shon Baton, MD    Family History History reviewed. No pertinent family history.  Social History Social History  Substance Use Topics  . Smoking status: Never Smoker  . Smokeless tobacco: Never Used  . Alcohol use No     Comment: occasionally     Allergies   Celebrex [celecoxib]; Vioxx [rofecoxib]; and Zanaflex  [tizanidine hcl]   Review of Systems Review of Systems  Constitutional: Negative for fever.  HENT: Negative for congestion.   Respiratory: Negative for cough and shortness of breath.   Cardiovascular: Negative for chest pain.  Gastrointestinal: Positive for abdominal pain and nausea. Negative for diarrhea and vomiting.  Genitourinary: Positive for flank pain. Negative for dysuria and hematuria.  Neurological: Positive for headaches. Negative for dizziness, weakness and numbness.  All other systems reviewed and are negative.    Physical Exam Updated Vital Signs BP 114/74 (BP Location: Left Arm)   Pulse 91   Temp 97.9 F (36.6 C) (Oral)   Resp 18   Ht 6\' 3"  (1.905 m)   Wt 194 lb (88 kg)   SpO2 96%   BMI 24.25 kg/m   Physical Exam  Constitutional: He is oriented to person, place, and time. He appears well-developed and well-nourished. No distress.  HENT:  Head: Normocephalic and atraumatic.  Eyes: EOM are normal. Pupils are equal, round, and reactive to light.  Neck: Normal range of motion.  Cardiovascular: Normal rate, regular rhythm and normal heart sounds.   No murmur heard. Pulmonary/Chest: Effort normal and breath sounds normal. No respiratory distress. He has no wheezes.  Abdominal: Soft. Bowel sounds are normal. There is no tenderness. There is no rebound and no guarding.  Musculoskeletal: He exhibits no edema.  Neurological: He is alert and oriented to person, place, and time.  4+ out of 5 strength right grip, biceps, triceps (baseline per patient), 5 out of 5 strength left upper extremity, foot drop right lower extremity, 5 out of 5 strength left lower extremity, cranial nerves II through XII intact  Skin: Skin is warm and dry.  Psychiatric: He has a normal mood and affect.  Nursing note and vitals reviewed.    ED Treatments / Results  Labs (all labs ordered are listed, but only abnormal results are displayed) Labs Reviewed  CBC WITH DIFFERENTIAL/PLATELET -  Abnormal; Notable for the following:       Result Value   Lymphs Abs 0.2 (*)    All other components within normal limits  COMPREHENSIVE METABOLIC PANEL - Abnormal; Notable for the following:    Chloride 97 (*)    Glucose, Bld 113 (*)    All other components within normal limits  URINALYSIS, ROUTINE W REFLEX MICROSCOPIC - Abnormal; Notable for the following:    Ketones, ur 20 (*)    Bacteria, UA RARE (*)    All other components within normal limits    EKG  EKG Interpretation None       Radiology No results found.  Procedures Procedures (including critical care time)  Medications Ordered in ED Medications  acetaminophen (TYLENOL) tablet 1,000 mg (1,000 mg Oral Given 11/03/16 0256)  sodium chloride 0.9 % bolus 1,000 mL (1,000 mLs Intravenous New Bag/Given 11/03/16 0247)  prochlorperazine (COMPAZINE) injection 10 mg (10 mg  Intravenous Given 11/03/16 0252)  diphenhydrAMINE (BENADRYL) injection 25 mg (25 mg Intravenous Given 11/03/16 0255)  ondansetron (ZOFRAN) injection 4 mg (4 mg Intravenous Given 11/03/16 0250)     Initial Impression / Assessment and Plan / ED Course  I have reviewed the triage vital signs and the nursing notes.  Pertinent labs & imaging results that were available during my care of the patient were reviewed by me and considered in my medical decision making (see chart for details).     A short presents with headache and nausea. Generally doesn't fill well. No new focal neurologic deficits. Otherwise nontoxic. History of headaches with similar presentation. Patient was given a migraine cocktail. Basic labwork obtained given generalized myalgias and right flank pain. Lab work is reassuring. Patient reports he is much improved after migraine cocktail. Will discharge home with primary follow-up.  After history, exam, and medical workup I feel the patient has been appropriately medically screened and is safe for discharge home. Pertinent diagnoses were discussed with  the patient. Patient was given return precautions.   Final Clinical Impressions(s) / ED Diagnoses   Final diagnoses:  Nausea  Acute nonintractable headache, unspecified headache type    New Prescriptions New Prescriptions   ONDANSETRON (ZOFRAN ODT) 4 MG DISINTEGRATING TABLET    Take 1 tablet (4 mg total) by mouth every 8 (eight) hours as needed for nausea or vomiting.     Shon Baton, MD 11/03/16 [phone]

## 2016-11-03 NOTE — ED Triage Notes (Signed)
Pt c/o headache with n/v and generalized body aches x 1 day

## 2016-11-05 ENCOUNTER — Ambulatory Visit (INDEPENDENT_AMBULATORY_CARE_PROVIDER_SITE_OTHER): Payer: Medicare Other | Admitting: Nurse Practitioner

## 2016-11-05 ENCOUNTER — Encounter: Payer: Self-pay | Admitting: Nurse Practitioner

## 2016-11-05 DIAGNOSIS — R7989 Other specified abnormal findings of blood chemistry: Secondary | ICD-10-CM | POA: Diagnosis not present

## 2016-11-05 DIAGNOSIS — R945 Abnormal results of liver function studies: Principal | ICD-10-CM

## 2016-11-05 NOTE — Patient Instructions (Signed)
1. Have your labs drawn when you're able to. 2. We will help you schedule your ultrasound of your liver. 3. When your labs to been drawn and your ultrasound completed, you can discuss with neurology about possibly trying a lower dose of your medication. 4. We will call you with results only receive them.

## 2016-11-05 NOTE — Progress Notes (Signed)
Primary Care Physician:  [name], MD Primary Gastroenterologist:  Dr. Jena Gauss  Chief Complaint  Patient presents with  . Elevated LFT'S    HPI:   Vincent Anthony is a 46 y.o. male who presents On referral from primary care for elevated liver enzymes. PCP notes reviewed. Neurology notes reviewed from [facility]. Patient has MS and is on medication (Gilenya) for this. Drug profile reviewed and has a 10-15% incidence of elevated AST and/or ALT. Per PCP notes the patients LFT have been trending up over the last few years. Query adverse effect of medication versus other etiology. No recent liver imaging in our system.  Today he states he's doing ok today. Has some right-sided abdominal pain, typically improved after bowel movement. Had a recent single episode of vomiting and fever on Monday and went to the ER thinks it was related to picking up "the bug." Denies hematochezia, melena. Has had some weight loss recently, hasn't had much of an appetite since last week (around the time he began to feel ill.) Per PCP borderline DM. Denies yellowing of skin/eyes, darkened urine (UA on Monday was negative for bilirubin), sudden confusion. Has recently had worsening fluctuance more foul odor then he's used to. Has a bowel movement about every 2 days, passes easily without straining. Denies chest pain, dyspnea, dizziness, lightheadedness, syncope, near syncope. Denies any other upper or lower GI symptoms.  Occasional alcohol, none in a couple years. Denies history of heavy dring. Denies ever using IV drugs.  Past Medical History:  Diagnosis Date  . Elevated liver enzymes   . Headache   . MS (multiple sclerosis) (HCC)   . Tremor     Past Surgical History:  Procedure Laterality Date  . COLONOSCOPY     2-4 years ago  . HERNIA REPAIR    . KNEE SURGERY    . TESTICLE REMOVAL      Current Outpatient Prescriptions  Medication Sig Dispense Refill  . amitriptyline (ELAVIL) 10 MG tablet  Take 1 tablet by mouth at bedtime.     . baclofen (LIORESAL) 10 MG tablet Take 1 tablet by mouth 3 (three) times daily.    . cetirizine (ZYRTEC) 10 MG tablet Take 10 mg by mouth daily.    . clonazePAM (KLONOPIN) 1 MG tablet Take 1 tablet by mouth 3 (three) times daily as needed for anxiety.     . Coenzyme Q10 (CO Q 10) 100 MG CAPS Take 1 tablet by mouth every morning.    . cyclobenzaprine (FLEXERIL) 10 MG tablet Take 10 mg by mouth 3 (three) times daily as needed for muscle spasms.    . fluticasone (FLONASE) 50 MCG/ACT nasal spray Place 2 sprays into both nostrils daily as needed for allergies or rhinitis.    Marland Kitchen gabapentin (NEURONTIN) 300 MG capsule Take 1 capsule by mouth 3 (three) times daily.    Marland Kitchen GILENYA 0.5 MG CAPS Take 1 capsule by mouth daily.    Marland Kitchen ibuprofen (ADVIL,MOTRIN) 200 MG tablet Take 400 mg by mouth every 6 (six) hours as needed for moderate pain.    Marland Kitchen levETIRAcetam (KEPPRA) 250 MG tablet Take 2 tablets by mouth 3 (three) times daily.     . methylphenidate (RITALIN) 10 MG tablet Take 1 tablet by mouth daily.    . Multiple Vitamin (MULTIVITAMIN WITH MINERALS) TABS tablet Take 1 tablet by mouth daily.    . ondansetron (ZOFRAN ODT) 4 MG disintegrating tablet Take 1 tablet (4 mg total) by mouth every 8 (  eight) hours as needed for nausea or vomiting. 20 tablet 0  . pravastatin (PRAVACHOL) 20 MG tablet Take 20 mg by mouth daily.    . traMADol (ULTRAM) 50 MG tablet Take 50-100 mg by mouth at bedtime as needed for moderate pain.     . VESICARE 10 MG tablet Take 1 tablet by mouth daily.     No current facility-administered medications for this visit.     Allergies as of 11/05/2016 - Review Complete 11/05/2016  Allergen Reaction Noted  . Celebrex [celecoxib] Nausea And Vomiting 03/04/2013  . Vioxx [rofecoxib] Nausea And Vomiting 03/04/2013  . Zanaflex [tizanidine hcl] Nausea And Vomiting 11/30/2014    Family History  Problem Relation Age of Onset  . Colon cancer Neg Hx     Social  History   Social History  . Marital status: Married    Spouse name: N/A  . Number of children: N/A  . Years of education: N/A   Occupational History  . Not on file.   Social History Main Topics  . Smoking status: Never Smoker  . Smokeless tobacco: Never Used  . Alcohol use No     Comment: occasionally  . Drug use: No  . Sexual activity: Not on file   Other Topics Concern  . Not on file   Social History Narrative  . No narrative on file    Review of Systems: General: Negative for anorexia, fever, chills, fatigue, weakness. ENT: Negative for hoarseness, difficulty swallowing. CV: Negative for chest pain, angina, palpitations, peripheral edema.  Respiratory: Negative for dyspnea at rest, cough, sputum, wheezing.  GI: See history of present illness. Derm: Negative for rash or itching.  Neuro: Negative for memory loss, confusion.  Endo: Has had some limited recent weight loss when sick last week.  Heme: Negative for bruising or bleeding. Allergy: Negative for rash or hives.    Physical Exam: BP 138/86   Pulse 83   Temp 97.6 F (36.4 C) (Oral)   Ht 6\' 3"  (1.905 m)   Wt 205 lb (93 kg)   BMI 25.62 kg/m  General:   Alert and oriented. Pleasant and cooperative. Well-nourished and well-developed. Ambulates with assistive device. Head:  Normocephalic and atraumatic. Eyes:  Without icterus, sclera clear and conjunctiva pink.  Ears:  Normal auditory acuity. Cardiovascular:  S1, S2 present without murmurs appreciated. Extremities without clubbing or edema. Respiratory:  Clear to auscultation bilaterally. No wheezes, rales, or rhonchi. No distress.  Gastrointestinal:  +BS, soft, non-tender and non-distended. No HSM noted. No guarding or rebound. No masses appreciated.  Rectal:  Deferred  Musculoskalatal:  Symmetrical without gross deformities. Neurologic:  Alert and oriented x4;  grossly normal neurologically. Psych:  Alert and cooperative. Normal mood and  affect. Heme/Lymph/Immune: No excessive bruising noted.    11/09/2016 12:59 PM   Disclaimer: This note was dictated with voice recognition software. Similar sounding words can inadvertently be transcribed and may not be corrected upon review.

## 2016-11-09 NOTE — Assessment & Plan Note (Signed)
Noted elevated AST/ALT 51/178 and alk phos 175 on Gilenya for MS which has an ADE of elevated AST and/or ALT in 10-15% of patients. PCP notes upward trend in his labs over the past few years. They are considering dose reduction or agent adjustment for his MS. I will work toward rapid evaluation of other causes of elevated LFTs to help neurology with decision making for his MS regimen. Today I'll order a right upper quadrant ultrasound and labs including CBC, BMP, HFP, PTT/INR, autoimmune labs, and hepatitis A, B, and C and serologies. After his labs are done and ultrasound completed they can consider trial of dose reduction and follow labs for improvement at which point a rechallenge can be attempted to help discern if his medication is contributing to his labs. Return for follow-up in 2 months.

## 2016-11-09 NOTE — Progress Notes (Signed)
CC'D TO PCP °

## 2016-11-11 ENCOUNTER — Telehealth: Payer: Self-pay | Admitting: Internal Medicine

## 2016-11-11 NOTE — Telephone Encounter (Signed)
[phone] patient called and stated that he already tested neg for hep c, his pcp sent him and he got the results today.  Can you please call him because he has a question

## 2016-11-13 NOTE — Telephone Encounter (Signed)
Darl Pikes, I have looked in [place] and in [facility] and cannot find any results. Can you get results from pcp?

## 2016-11-16 ENCOUNTER — Ambulatory Visit (HOSPITAL_COMMUNITY)
Admission: RE | Admit: 2016-11-16 | Discharge: 2016-11-16 | Disposition: A | Discharge status: Home / Self Care | Payer: Medicare Other | Source: Ambulatory Visit | Attending: Nurse Practitioner | Admitting: Nurse Practitioner

## 2016-11-16 DIAGNOSIS — R932 Abnormal findings on diagnostic imaging of liver and biliary tract: Secondary | ICD-10-CM | POA: Insufficient documentation

## 2016-11-16 DIAGNOSIS — R7989 Other specified abnormal findings of blood chemistry: Secondary | ICD-10-CM | POA: Insufficient documentation

## 2016-11-16 DIAGNOSIS — R945 Abnormal results of liver function studies: Secondary | ICD-10-CM

## 2016-11-16 LAB — CBC WITH DIFFERENTIAL/PLATELET
Basophils Absolute: 0 cells/uL (ref 0–200)
Basophils Relative: 0 %
Eosinophils Absolute: 44 cells/uL (ref 15–500)
Eosinophils Relative: 1 %
HCT: 40.9 % (ref 38.5–50.0)
Hemoglobin: 13.2 g/dL (ref 13.2–17.1)
Lymphocytes Relative: 7 %
Lymphs Abs: 308 cells/uL — ABNORMAL LOW (ref 850–3900)
MCH: 27.7 pg (ref 27.0–33.0)
MCHC: 32.3 g/dL (ref 32.0–36.0)
MCV: 85.7 fL (ref 80.0–100.0)
MPV: 10.3 fL (ref 7.5–12.5)
Monocytes Absolute: 748 cells/uL (ref 200–950)
Monocytes Relative: 17 %
Neutro Abs: 3300 cells/uL (ref 1500–7800)
Neutrophils Relative %: 75 %
Platelets: 281 10*3/uL (ref 140–400)
RBC: 4.77 MIL/uL (ref 4.20–5.80)
RDW: 15.2 % — ABNORMAL HIGH (ref 11.0–15.0)
WBC: 4.4 10*3/uL (ref 3.8–10.8)

## 2016-11-17 LAB — HEPATIC FUNCTION PANEL
ALT: 108 U/L — ABNORMAL HIGH (ref 9–46)
AST: 50 U/L — ABNORMAL HIGH (ref 10–40)
Albumin: 4.6 g/dL (ref 3.6–5.1)
Alkaline Phosphatase: 143 U/L — ABNORMAL HIGH (ref 40–115)
Bilirubin, Direct: 0.2 mg/dL (ref <=0.2)
Indirect Bilirubin: 0.4 mg/dL (ref 0.2–1.2)
Total Bilirubin: 0.6 mg/dL (ref 0.2–1.2)
Total Protein: 7.1 g/dL (ref 6.1–8.1)

## 2016-11-17 LAB — BASIC METABOLIC PANEL
BUN: 14 mg/dL (ref 7–25)
CO2: 27 mmol/L (ref 20–31)
Calcium: 9.7 mg/dL (ref 8.6–10.3)
Chloride: 105 mmol/L (ref 98–110)
Creat: 0.99 mg/dL (ref 0.60–1.35)
Glucose, Bld: 101 mg/dL — ABNORMAL HIGH (ref 65–99)
Potassium: 4.4 mmol/L (ref 3.5–5.3)
Sodium: 140 mmol/L (ref 135–146)

## 2016-11-17 LAB — HEPATITIS A ANTIBODY, TOTAL: Hep A Total Ab: NONREACTIVE

## 2016-11-17 LAB — ANTI-NUCLEAR AB-TITER (ANA TITER): ANA Titer 1: 1:80 {titer} — ABNORMAL HIGH

## 2016-11-17 LAB — HEPATITIS C ANTIBODY: HCV Ab: NEGATIVE

## 2016-11-17 LAB — ANA: Anti Nuclear Antibody(ANA): POSITIVE — AB

## 2016-11-17 LAB — HEPATITIS B SURFACE ANTIGEN: Hepatitis B Surface Ag: NEGATIVE

## 2016-11-17 NOTE — Telephone Encounter (Signed)
Requested recent labs (hep c)

## 2016-11-18 ENCOUNTER — Telehealth: Payer: Self-pay | Admitting: Internal Medicine

## 2016-11-18 LAB — ANTI-MICROSOMAL ANTIBODY LIVER / KIDNEY: LKM1 Ab: <=20 U (ref <=20.0)

## 2016-11-18 LAB — ANTI-SMOOTH MUSCLE ANTIBODY, IGG: Smooth Muscle Ab: 20 U — ABNORMAL HIGH (ref <20)

## 2016-11-18 NOTE — Telephone Encounter (Signed)
Spoke with the pt. See results.

## 2016-11-18 NOTE — Telephone Encounter (Signed)
PATIENT CALLED INQUIRING ABOUT LABS WE HAD HIM HAVE DONE LAST WEEK.  [phone]

## 2016-11-23 ENCOUNTER — Telehealth: Payer: Self-pay

## 2016-11-23 NOTE — Telephone Encounter (Signed)
Pt is wanting to know if his blood work is back. Please advise

## 2016-11-23 NOTE — Telephone Encounter (Signed)
Routing to EG, we were waiting for autoimmune labs to come in.

## 2016-11-24 ENCOUNTER — Telehealth: Payer: Self-pay | Admitting: Internal Medicine

## 2016-11-24 ENCOUNTER — Other Ambulatory Visit: Payer: Self-pay

## 2016-11-24 ENCOUNTER — Telehealth: Payer: Self-pay | Admitting: Nurse Practitioner

## 2016-11-24 ENCOUNTER — Other Ambulatory Visit: Payer: Self-pay | Admitting: Nurse Practitioner

## 2016-11-24 DIAGNOSIS — R7989 Other specified abnormal findings of blood chemistry: Secondary | ICD-10-CM

## 2016-11-24 DIAGNOSIS — R945 Abnormal results of liver function studies: Principal | ICD-10-CM

## 2016-11-24 NOTE — Telephone Encounter (Signed)
Pt is aware. He is taking Gilenya 0.5mg  daily, he said his neurologist told him to start taking it every other day if we needed to cut back on his medication. Pt said he took it this morning, so he will not take it tomorrow. I am going to mail him the lab orders and he will have them done in 30 days. He also requested that I sent a copy of his blood work to his pcp. I have cc'd his pcp.

## 2016-11-24 NOTE — Telephone Encounter (Signed)
Please tell the patient one of his autoimmune labs came back elevated. That could be due to MS (it is a non-specific lab). Please ask if they've cut his MS medication in half yet.   If not: we recommend cutting his medication in half and we will check his liver enzymes 30 days after being on a reduced dose to see if they change.  If they have, please find the date he started to reduced dose and put in CMP for 30 days after that.  Please let me know what dose he is on currently and if neurology has decided if they're going to change his dose.  He may eventually need a liver biopsy, but we can hold off for now. Further recommendations to follow.

## 2016-11-24 NOTE — Telephone Encounter (Signed)
Pt called again today asking if his lab results were back. I told him it normally takes 7-10 business days and the nurse would be in touch with him when they were available. [phone]

## 2016-11-24 NOTE — Telephone Encounter (Signed)
See other phone note. I spoke with the pt.

## 2016-11-25 NOTE — Telephone Encounter (Signed)
Noted. He has an OV 4/10 (a couple weeks after his labs are due) which will allow [place] to discuss and make any further recommendations.

## 2017-01-05 ENCOUNTER — Ambulatory Visit (INDEPENDENT_AMBULATORY_CARE_PROVIDER_SITE_OTHER): Payer: Medicare Other | Admitting: Nurse Practitioner

## 2017-01-05 ENCOUNTER — Other Ambulatory Visit: Payer: Self-pay

## 2017-01-05 ENCOUNTER — Encounter: Payer: Self-pay | Admitting: Nurse Practitioner

## 2017-01-05 VITALS — BP 123/68 | HR 77 | Temp 97.6°F | Ht 75.0 in | Wt 218.6 lb

## 2017-01-05 DIAGNOSIS — R945 Abnormal results of liver function studies: Principal | ICD-10-CM

## 2017-01-05 DIAGNOSIS — R1084 Generalized abdominal pain: Secondary | ICD-10-CM | POA: Diagnosis not present

## 2017-01-05 DIAGNOSIS — R7989 Other specified abnormal findings of blood chemistry: Secondary | ICD-10-CM

## 2017-01-05 DIAGNOSIS — R109 Unspecified abdominal pain: Secondary | ICD-10-CM | POA: Insufficient documentation

## 2017-01-05 MED ORDER — HYOSCYAMINE SULFATE ER 0.375 MG PO TB12: 0.3750 mg | ORAL_TABLET | Freq: Two times a day (BID) | ORAL | 3 refills | Status: DC

## 2017-01-05 NOTE — Progress Notes (Signed)
CC'ED TO PCP 

## 2017-01-05 NOTE — Assessment & Plan Note (Signed)
Chronic history of right-sided abdominal/colon spasm. Has been managed on Levbid for number of years with good effect. He is asking [place] to take over prescription of this medicine. I will send in a refill for him as it is difficult for him to travel all the way to [facility] for follow-up visit.

## 2017-01-05 NOTE — Progress Notes (Signed)
Referring Provider: [name], MD Primary Care Physician:  [name], MD Primary GI:  Dr. Jena Gauss  Chief Complaint  Patient presents with  . elevated LFTs    f/u, doing ok    HPI:   Vincent Anthony is a 46 y.o. male who presents for follow-up on elevated LFTs. The patient was last seen in our office to 05/17/2017 for the same. The patient has a history of multiple sclerosis and is on a medication (Gilenya) and it was noted by primary care and neurology that the drug profile has a 10-15% increase of elevated AST and/or ALT. PCP noted trend up and LFTs of the previous few years and thought to be adverse effect of medication. Mild, intermittent, rare abdominal pain at his last visit and recent encounter with "stomach bug." Otherwise generally asymptomatic from a GI and hepatic standpoint. Noted occasional alcohol use stone 9 and a couple years. Denies IV drug use. Recommended right upper quadrant ultrasound, CBC, BMP, HSP, PTT/INR, autoimmune serologies, viral hepatitis serologies. If normal can consider trial of dose reduction and trending of LFTs. Return for follow-up in 2 months.  Negative hepatitis A, B, and see. BMP normal, AST/ALT continued elevated at 50/108 and mild elevation in alkaline phosphatase at 143 but normal bilirubin. CBC essentially normal, normal platelet count specifically. Most autoimmune serologies normal. N/A noted to be elevated with a titer of 1:80 considered borderline low antibody level to elevated antibody level, but this is not surprising given his MS. Right upper quadrant ultrasound found normal gallbladder and common bile duct, increased hepatic echotexture most compatible with fatty infiltrative change. All results were communicated to the patient. Cleared by neurology to decrease his dose to every other day which was recommended to the patient in order to trend his LFTs.  Today he states he's doing ok overall. Has some fatigue related to his MS. Has not  noted any significant changes in his MS symptoms since decreasing his MS medication to every other day. Rare abdominal pain right-sided, he takes Levbid for this. His previous GI provider at [facility] is no longer there and was told he would need to come for an OV at [facility] to continue to receive Levbid, which is a hardship for him given his MS symptoms. Is requesting Rx from [place]. Denies N/V, fever, chills, unintentional weight loss, acute bowel habit changes. Denies yellowing of skin/eyes, darkened urine, sudden episodic confusion. Denies chest pain, dyspnea, dizziness, lightheadedness, syncope, near syncope. Denies any other upper or lower GI symptoms.  Past Medical History:  Diagnosis Date  . Elevated liver enzymes   . Headache   . MS (multiple sclerosis) (HCC)   . Tremor     Past Surgical History:  Procedure Laterality Date  . COLONOSCOPY     2-4 years ago  . HERNIA REPAIR    . KNEE SURGERY    . TESTICLE REMOVAL      Current Outpatient Prescriptions  Medication Sig Dispense Refill  . amitriptyline (ELAVIL) 10 MG tablet Take 1 tablet by mouth at bedtime.     . baclofen (LIORESAL) 10 MG tablet Take 1 tablet by mouth 3 (three) times daily.    . cetirizine (ZYRTEC) 10 MG tablet Take 10 mg by mouth daily.    . clonazePAM (KLONOPIN) 1 MG tablet Take 1 tablet by mouth 3 (three) times daily as needed for anxiety.     . Coenzyme Q10 (CO Q 10) 100 MG CAPS Take 1 tablet by mouth every morning.    Marland Kitchen  cyclobenzaprine (FLEXERIL) 10 MG tablet Take 10 mg by mouth 3 (three) times daily as needed for muscle spasms.    . fluticasone (FLONASE) 50 MCG/ACT nasal spray Place 2 sprays into both nostrils daily as needed for allergies or rhinitis.    Marland Kitchen gabapentin (NEURONTIN) 300 MG capsule Take 1 capsule by mouth 3 (three) times daily.    Marland Kitchen GILENYA 0.5 MG CAPS Take 1 capsule by mouth every other day.     . hyoscyamine (LEVBID) 0.375 MG 12 hr tablet Take 1 tablet by mouth 2 (two) times daily.    Marland Kitchen ibuprofen  (ADVIL,MOTRIN) 200 MG tablet Take 400 mg by mouth every 6 (six) hours as needed for moderate pain.    Marland Kitchen levETIRAcetam (KEPPRA) 250 MG tablet Take 2 tablets by mouth 3 (three) times daily.     . methylphenidate (RITALIN) 10 MG tablet Take 1 tablet by mouth as needed.     . Multiple Vitamin (MULTIVITAMIN WITH MINERALS) TABS tablet Take 1 tablet by mouth daily.    . ondansetron (ZOFRAN ODT) 4 MG disintegrating tablet Take 1 tablet (4 mg total) by mouth every 8 (eight) hours as needed for nausea or vomiting. 20 tablet 0  . pravastatin (PRAVACHOL) 20 MG tablet Take 20 mg by mouth daily.    . VESICARE 10 MG tablet Take 1 tablet by mouth daily.     No current facility-administered medications for this visit.     Allergies as of 01/05/2017 - Review Complete 01/05/2017  Allergen Reaction Noted  . Celebrex [celecoxib] Nausea And Vomiting 03/04/2013  . Vioxx [rofecoxib] Nausea And Vomiting 03/04/2013  . Zanaflex [tizanidine hcl] Nausea And Vomiting 11/30/2014    Family History  Problem Relation Age of Onset  . Colon cancer Neg Hx     Social History   Social History  . Marital status: Married    Spouse name: N/A  . Number of children: N/A  . Years of education: N/A   Social History Main Topics  . Smoking status: Never Smoker  . Smokeless tobacco: Never Used  . Alcohol use No     Comment: occasionally  . Drug use: No  . Sexual activity: Not Asked   Other Topics Concern  . None   Social History Narrative  . None    Review of Systems: General: Negative for anorexia, weight loss, fever, chills. ENT: Negative for hoarseness, difficulty swallowing. CV: Negative for chest pain, angina, palpitations, peripheral edema.  Respiratory: Negative for dyspnea at rest, cough, sputum, wheezing.  GI: See history of present illness. GU:  Negative for dysuria, hematuria, urinary incontinence, urinary frequency, nocturnal urination.  MS: Negative for joint pain, low back pain.  Derm: Negative  for rash or itching.  Neuro: No significanr changes in MS symptoms.  Endo: Negative for unusual weight change.  Heme: Negative for bruising or bleeding. Allergy: Negative for rash or hives.   Physical Exam: BP 123/68   Pulse 77   Temp 97.6 F (36.4 C) (Oral)   Ht 6\' 3"  (1.905 m)   Wt 218 lb 9.6 oz (99.2 kg)   BMI 27.32 kg/m  General:   Alert and oriented. Pleasant and cooperative. Well-nourished and well-developed.  Head:  Normocephalic and atraumatic. Eyes:  Without icterus, sclera clear and conjunctiva pink.  Ears:  Normal auditory acuity. Cardiovascular:  S1, S2 present without murmurs appreciated. Extremities without clubbing or edema. Respiratory:  Clear to auscultation bilaterally. No wheezes, rales, or rhonchi. No distress.  Gastrointestinal:  +BS, rounded but soft,  non-tender and non-distended. No HSM noted. No guarding or rebound. No masses appreciated.  Rectal:  Deferred  Musculoskalatal:  Lower right leg brace in place. Neurologic:   Ambulates with a walker Psych:  Alert and cooperative. Normal mood and affect. Heme/Lymph/Immune: No excessive bruising noted.    01/05/2017 10:07 AM   Disclaimer: This note was dictated with voice recognition software. Similar sounding words can inadvertently be transcribed and may not be corrected upon review.

## 2017-01-05 NOTE — Assessment & Plan Note (Addendum)
Elevated LFTs generally felt to possibly be due to adverse effect of his MS medicine. He has been on a half dose per neurology for the past 3 weeks. We will plan on rechecking his LFTs in one week for 30 month washout period. Viral hepatitis levels negative, CBC normal, mild bump in LFTs at last lab draw with normal bilirubin. Generally asymptomatic from a hepatic standpoint. And a mildly elevated but no other autoimmune serologies positive. Mild bump in ANA likely due to MS. We will call him only get the results of his updated LFTs in order to allow neurology to decide how they would like to proceed with his medications.  Return for follow-up in 3 months which will allow neurology to make any changes in 1 month. At that time we can check his LFTs again.

## 2017-01-05 NOTE — Patient Instructions (Addendum)
1. Have your blood test drawn in one week. 2. Follow-up and urology next month. 3. I've refilled your hyoscyamine. 4. Return for follow-up. In 3 months. 5. Below is information on fatty liver diet.     Fatty Liver Fatty liver, also called hepatic steatosis or steatohepatitis, is a condition in which too much fat has built up in your liver cells. The liver removes harmful substances from your bloodstream. It produces fluids your body needs. It also helps your body use and store energy from the food you eat. In many cases, fatty liver does not cause symptoms or problems. It is often diagnosed when tests are being done for other reasons. However, over time, fatty liver can cause inflammation that may lead to more serious liver problems, such as scarring of the liver (cirrhosis). What are the causes? Causes of fatty liver may include:  Drinking too much alcohol.  Poor nutrition.  Obesity.  Cushing syndrome.  Diabetes.  Hyperlipidemia.  Pregnancy.  Certain drugs.  Poisons.  Some viral infections. What increases the risk? You may be more likely to develop fatty liver if you:  Abuse alcohol.  Are pregnant.  Are overweight.  Have diabetes.  Have hepatitis.  Have a high triglyceride level. What are the signs or symptoms? Fatty liver often does not cause any symptoms. In cases where symptoms develop, they can include:  Fatigue.  Weakness.  Weight loss.  Confusion.  Abdominal pain.  Yellowing of your skin and the white parts of your eyes (jaundice).  Nausea and vomiting. How is this diagnosed? Fatty liver may be diagnosed by:  Physical exam and medical history.  Blood tests.  Imaging tests, such as an ultrasound, CT scan, or MRI.  Liver biopsy. A small sample of liver tissue is removed using a needle. The sample is then looked at under a microscope. How is this treated? Fatty liver is often caused by other health conditions. Treatment for fatty liver  may involve medicines and lifestyle changes to manage conditions such as:  Alcoholism.  High cholesterol.  Diabetes.  Being overweight or obese. Follow these instructions at home:  Eat a healthy diet as directed by your health care provider.  Exercise regularly. This can help you lose weight and control your cholesterol and diabetes. Talk to your health care provider about an exercise plan and which activities are best for you.  Do not drink alcohol.  Take medicines only as directed by your health care provider. Contact a health care provider if: You have difficulty controlling your:  Blood sugar.  Cholesterol.  Alcohol consumption. Get help right away if:  You have abdominal pain.  You have jaundice.  You have nausea and vomiting. This information is not intended to replace advice given to you by your health care provider. Make sure you discuss any questions you have with your health care provider. Document Released: 10/30/2005 Document Revised: 02/20/2016 Document Reviewed: 01/24/2014 Elsevier Interactive Patient Education  2017 Elsevier Inc.    Heart-Healthy Eating Plan Heart-healthy meal planning includes:  Limiting unhealthy fats.  Increasing healthy fats.  Making other small dietary changes. You may need to talk with your doctor or a diet specialist (dietitian) to create an eating plan that is right for you. What types of fat should I choose?  Choose healthy fats. These include olive oil and canola oil, flaxseeds, walnuts, almonds, and seeds.  Eat more omega-3 fats. These include salmon, mackerel, sardines, tuna, flaxseed oil, and ground flaxseeds. Try to eat fish at least twice  each week.  Limit saturated fats.  Saturated fats are often found in animal products, such as meats, butter, and cream.  Plant sources of saturated fats include palm oil, palm kernel oil, and coconut oil.  Avoid foods with partially hydrogenated oils in them. These include  stick margarine, some tub margarines, cookies, crackers, and other baked goods. These contain trans fats. What general guidelines do I need to follow?  Check food labels carefully. Identify foods with trans fats or high amounts of saturated fat.  Fill one half of your plate with vegetables and green salads. Eat 4-5 servings of vegetables per day. A serving of vegetables is:  1 cup of raw leafy vegetables.   cup of raw or cooked cut-up vegetables.   cup of vegetable juice.  Fill one fourth of your plate with whole grains. Look for the word "whole" as the first word in the ingredient list.  Fill one fourth of your plate with lean protein foods.  Eat 4-5 servings of fruit per day. A serving of fruit is:  One medium whole fruit.   cup of dried fruit.   cup of fresh, frozen, or canned fruit.   cup of 100% fruit juice.  Eat more foods that contain soluble fiber. These include apples, broccoli, carrots, beans, peas, and barley. Try to get 20-30 g of fiber per day.  Eat more home-cooked food. Eat less restaurant, buffet, and fast food.  Limit or avoid alcohol.  Limit foods high in starch and sugar.  Avoid fried foods.  Avoid frying your food. Try baking, boiling, grilling, or broiling it instead. You can also reduce fat by:  Removing the skin from poultry.  Removing all visible fats from meats.  Skimming the fat off of stews, soups, and gravies before serving them.  Steaming vegetables in water or broth.  Lose weight if you are overweight.  Eat 4-5 servings of nuts, legumes, and seeds per week:  One serving of dried beans or legumes equals  cup after being cooked.  One serving of nuts equals 1 ounces.  One serving of seeds equals  ounce or one tablespoon.  You may need to keep track of how much salt or sodium you eat. This is especially true if you have high blood pressure. Talk with your doctor or dietitian to get more information. What foods can I  eat? Grains  Breads, including [place], white, pita, wheat, raisin, rye, oatmeal, and [place]. Tortillas that are neither fried nor made with lard or trans fat. Low-fat rolls, including hotdog and hamburger buns and English muffins. Biscuits. Muffins. Waffles. Pancakes. Light popcorn. Whole-grain cereals. Flatbread. Melba toast. Pretzels. Breadsticks. Rusks. Low-fat snacks. Low-fat crackers, including oyster, saltine, matzo, graham, animal, and rye. Rice and pasta, including brown rice and pastas that are made with whole wheat. Vegetables  All vegetables. Fruits  All fruits, but limit coconut. Meats and Other Protein Sources  Lean, well-trimmed beef, veal, pork, and lamb. Chicken and [place] without skin. All fish and shellfish. Wild duck, rabbit, pheasant, and venison. Egg whites or low-cholesterol egg substitutes. Dried beans, peas, lentils, and tofu. Seeds and most nuts. Dairy  Low-fat or nonfat cheeses, including ricotta, string, and mozzarella. Skim or 1% milk that is liquid, powdered, or evaporated. Buttermilk that is made with low-fat milk. Nonfat or low-fat yogurt. Beverages  Mineral water. Diet carbonated beverages. Sweets and Desserts  Sherbets and fruit ices. Honey, jam, marmalade, jelly, and syrups. Meringues and gelatins. Pure sugar candy, such as hard candy, jelly  beans, gumdrops, mints, marshmallows, and small amounts of dark chocolate. [employer]. Eat all sweets and desserts in moderation. Fats and Oils  Nonhydrogenated (trans-free) margarines. Vegetable oils, including soybean, sesame, sunflower, olive, peanut, safflower, corn, canola, and cottonseed. Salad dressings or mayonnaise made with a vegetable oil. Limit added fats and oils that you use for cooking, baking, salads, and as spreads. Other  Cocoa powder. Coffee and tea. All seasonings and condiments. The items listed above may not be a complete list of recommended foods or beverages. Contact your dietitian for more  options.  What foods are not recommended? Grains  Breads that are made with saturated or trans fats, oils, or whole milk. Croissants. Butter rolls. Cheese breads. Sweet rolls. Donuts. Buttered popcorn. Chow mein noodles. High-fat crackers, such as cheese or butter crackers. Meats and Other Protein Sources  Fatty meats, such as hotdogs, short ribs, sausage, spareribs, bacon, rib eye roast or steak, and mutton. High-fat deli meats, such as salami and bologna. Caviar. Domestic duck and goose. Organ meats, such as kidney, liver, sweetbreads, and heart. Dairy  Cream, sour cream, cream cheese, and creamed cottage cheese. Whole-milk cheeses, including blue (bleu), [address], [place], [place], [address], [place], [address], cheddar, [place], and Brutus. Whole or 2% milk that is liquid, evaporated, or condensed. Whole buttermilk. Cream sauce or high-fat cheese sauce. Yogurt that is made from whole milk. Beverages  Regular sodas and juice drinks with added sugar. Sweets and Desserts  Frosting. Pudding. Cookies. Cakes other than angel food cake. Candy that has milk chocolate or white chocolate, hydrogenated fat, butter, coconut, or unknown ingredients. Buttered syrups. Full-fat ice cream or ice cream drinks. Fats and Oils  Gravy that has suet, meat fat, or shortening. Cocoa butter, hydrogenated oils, palm oil, coconut oil, palm kernel oil. These can often be found in baked products, candy, fried foods, nondairy creamers, and whipped toppings. Solid fats and shortenings, including bacon fat, salt pork, lard, and butter. Nondairy cream substitutes, such as coffee creamers and sour cream substitutes. Salad dressings that are made of unknown oils, cheese, or sour cream. The items listed above may not be a complete list of foods and beverages to avoid. Contact your dietitian for more information.  This information is not intended to replace advice given to you by your health care provider. Make sure you discuss any  questions you have with your health care provider. Document Released: 03/15/2012 Document Revised: 02/20/2016 Document Reviewed: 03/08/2014 Elsevier Interactive Patient Education  2017 [employer].

## 2017-01-08 ENCOUNTER — Telehealth: Payer: Self-pay | Admitting: Internal Medicine

## 2017-01-08 NOTE — Telephone Encounter (Signed)
His rx is at [facility], it was sent electronically. I spoke to [place] at [facility] and they do have it.

## 2017-01-08 NOTE — Telephone Encounter (Signed)
PATIENT LOST HIS PRESCRIPTION FOR STOMACH SPASMS AND WOULD LIKE IT SENT TO [facility]

## 2017-01-19 LAB — HEPATIC FUNCTION PANEL
ALT: 42 U/L (ref 9–46)
AST: 31 U/L (ref 10–40)
Albumin: 4.8 g/dL (ref 3.6–5.1)
Alkaline Phosphatase: 122 U/L — ABNORMAL HIGH (ref 40–115)
Bilirubin, Direct: 0.1 mg/dL (ref <=0.2)
Indirect Bilirubin: 0.5 mg/dL (ref 0.2–1.2)
Total Bilirubin: 0.6 mg/dL (ref 0.2–1.2)
Total Protein: 7.5 g/dL (ref 6.1–8.1)

## 2017-01-20 ENCOUNTER — Telehealth: Payer: Self-pay | Admitting: Internal Medicine

## 2017-01-20 NOTE — Telephone Encounter (Signed)
Patient called to say that he had his labs done and requested that I fax the results to his neurologist at [facility]. Fax # [phone]

## 2017-01-21 NOTE — Telephone Encounter (Signed)
See result note.  

## 2017-01-21 NOTE — Telephone Encounter (Signed)
cc'ed to pcp and to Dr Renne Crigler

## 2017-01-25 ENCOUNTER — Telehealth: Payer: Self-pay | Admitting: Internal Medicine

## 2017-01-25 DIAGNOSIS — R7989 Other specified abnormal findings of blood chemistry: Secondary | ICD-10-CM

## 2017-01-25 DIAGNOSIS — R945 Abnormal results of liver function studies: Principal | ICD-10-CM

## 2017-01-25 NOTE — Telephone Encounter (Signed)
Tried to call pt- NA- LMOM 

## 2017-01-25 NOTE — Telephone Encounter (Signed)
Pt has questions about his medicines and his blood work. Please call him at [phone]

## 2017-01-26 NOTE — Telephone Encounter (Signed)
Pt called back, left voicemail, he said he spoke with his neurologist and they want him to start back on taking his MS medication everyday.  He wanted to know if we could check his HFP in 30 days. He restarted it on 01/23/17. Routing to EG.

## 2017-01-28 NOTE — Addendum Note (Signed)
Addended by: Delane Ginger, Omarie Parcell A on: 01/28/2017 12:59 PM   Modules accepted: Orders

## 2017-01-28 NOTE — Telephone Encounter (Signed)
Please tell the patient lab orders were entered for 02/22/17.

## 2017-01-29 ENCOUNTER — Other Ambulatory Visit: Payer: Self-pay

## 2017-01-29 DIAGNOSIS — R7989 Other specified abnormal findings of blood chemistry: Secondary | ICD-10-CM

## 2017-01-29 DIAGNOSIS — R945 Abnormal results of liver function studies: Principal | ICD-10-CM

## 2017-01-29 NOTE — Telephone Encounter (Signed)
Letter and lab order printed. Mailed to the pt. Called and LMOM that I was mailing it out to him.

## 2017-02-17 ENCOUNTER — Telehealth: Payer: Self-pay

## 2017-02-17 NOTE — Telephone Encounter (Signed)
Pt called and said he is having a lot of gas and it is worse when he stands. He has tried Gas -x and it does not help. Please advise!

## 2017-02-18 ENCOUNTER — Telehealth: Payer: Self-pay | Admitting: Internal Medicine

## 2017-02-18 NOTE — Telephone Encounter (Signed)
Is it flatuance or belching?   If flatuance: any changes in bowel movement? How often is he going?  If belching: any drinking sodas/carbonated beverages?

## 2017-02-18 NOTE — Telephone Encounter (Signed)
Pt was following up with his call from yesterday about his gas and needing a prescription. Please advise and call [phone]

## 2017-02-18 NOTE — Telephone Encounter (Signed)
He is having flatulence, no belching. He has a BM about every other day and it is soft.

## 2017-02-23 ENCOUNTER — Telehealth: Payer: Self-pay | Admitting: Internal Medicine

## 2017-02-23 NOTE — Telephone Encounter (Signed)
Pt called again this morning asking about getting a prescription for his gas. He called on 5/23 and spoke with DS and she was waiting on a reply from EG. Please call patient back at [phone] and leave a message if he isn't home.

## 2017-02-23 NOTE — Telephone Encounter (Signed)
Vincent Anthony, please see note of 02/17/2017.

## 2017-02-24 ENCOUNTER — Telehealth: Payer: Self-pay | Admitting: Gastroenterology

## 2017-02-24 NOTE — Telephone Encounter (Signed)
Minerva Areola, please address.

## 2017-02-24 NOTE — Telephone Encounter (Signed)
PT is aware to get the Lactaid pills and that the handout is in the mail.

## 2017-02-24 NOTE — Telephone Encounter (Signed)
LMOM for a return call. Handout in the mail.

## 2017-02-24 NOTE — Telephone Encounter (Signed)
There are no real medications for gas pain other than GasX. Gas is likely due to diet. We are mailing you a handout about gas prevention. In the meantime you can take Lactaid 3 pills, 3 times a day. Let us know if you develop any other symptoms.

## 2017-02-24 NOTE — Telephone Encounter (Signed)
Routing to Eric for follow-up 

## 2017-02-24 NOTE — Telephone Encounter (Signed)
Pt called again this morning asking about his prescription because he hasn't heard anything from [place] or the pharmacy. I put patient on hold to speak with DS, but patient hung up. CM said that she would speak to patient if he called back. [phone]

## 2017-03-03 LAB — HEPATIC FUNCTION PANEL
ALT: 55 U/L — ABNORMAL HIGH (ref 9–46)
AST: 61 U/L — ABNORMAL HIGH (ref 10–40)
Albumin: 4.4 g/dL (ref 3.6–5.1)
Alkaline Phosphatase: 109 U/L (ref 40–115)
Bilirubin, Direct: 0.1 mg/dL (ref <=0.2)
Indirect Bilirubin: 0.4 mg/dL (ref 0.2–1.2)
Total Bilirubin: 0.5 mg/dL (ref 0.2–1.2)
Total Protein: 6.8 g/dL (ref 6.1–8.1)

## 2017-03-05 ENCOUNTER — Telehealth: Payer: Self-pay | Admitting: Internal Medicine

## 2017-03-05 NOTE — Telephone Encounter (Signed)
Pt was calling for his lab results and wanted to make sure that his PCP got a copy of it. I cc'ed the lab from 03/02/2017 to his PCP. Please call patient with results. [phone]

## 2017-03-08 ENCOUNTER — Telehealth: Payer: Self-pay | Admitting: Internal Medicine

## 2017-03-08 NOTE — Telephone Encounter (Signed)
I have spoken with the pt 

## 2017-03-08 NOTE — Telephone Encounter (Signed)
Completed, see result notes.

## 2017-03-08 NOTE — Telephone Encounter (Signed)
Pt is aware of results. 

## 2017-03-08 NOTE — Telephone Encounter (Signed)
PATIENT WANTS TO BE CALLED ABOUT HIS LAB RESULTS.  WANTS TO KNOW IF THE RESULTS ARE IN

## 2017-03-08 NOTE — Telephone Encounter (Signed)
Routing to Eric for results.  

## 2017-03-10 ENCOUNTER — Telehealth: Payer: Self-pay | Admitting: General Practice

## 2017-03-10 NOTE — Telephone Encounter (Signed)
Patient called in and stated that his neurologist would like for him to repeat his LFTs in August 2018, however the patient would like for [place] to repeat his labs, so he doesn't have to drive to [facility].  I instructed the patient to call his neurologist and have them give him the lab order and he can go to a drawing station in [place] to have his labs drawn.   I made him aware that if he has problems he could call us and we can speak with his neurologist.

## 2017-03-11 ENCOUNTER — Telehealth: Payer: Self-pay

## 2017-03-11 NOTE — Telephone Encounter (Signed)
Pt is asking about some kind of supplement that he takes when he works out he is wanting to know if it ok to keep taking it or if it could be causing his LFT'S to be elevated. Please advise

## 2017-03-12 NOTE — Telephone Encounter (Signed)
Pt is aware. He said that he has not started taking them yet. He was just wanting to know if he could start them back

## 2017-03-12 NOTE — Telephone Encounter (Signed)
Unfortunately it is difficult to say because supplements are not regulated and/or tested by the FDA for adverse effects. The best we can recommend is stopping it for a month and having LFTs rechecked to see if it makes a difference (like we did with his MS medication)

## 2017-04-14 ENCOUNTER — Encounter: Payer: Self-pay | Admitting: Nurse Practitioner

## 2017-04-14 ENCOUNTER — Ambulatory Visit: Payer: Medicare Other | Admitting: Nurse Practitioner

## 2017-04-14 ENCOUNTER — Telehealth: Payer: Self-pay | Admitting: Nurse Practitioner

## 2017-04-14 NOTE — Telephone Encounter (Signed)
PT WAS A NO SHOW AND LETTER SENT  °

## 2017-04-14 NOTE — Progress Notes (Deleted)
Referring Provider: [name], MD Primary Care Physician:  [name], MD Primary GI:  Dr. Jena Gauss  No chief complaint on file.   HPI:   Vincent Anthony is a 46 y.o. male who presents for abdominal pain. The patient was last seen in our office 01/05/2017 for elevated LFTs and generalized abdominal pain. The patient has a history of multiple sclerosis and there was a question if his medication (Gilenya) was causing an increase in AST/ALT as to drug her file elicited 10-15% chance. He was negative for hepatitis A, B, C. His BMP was normal. AST/ALT continued to be mildly elevated at 50/108 and mild elevation in alkaline phosphatase at 143 but normal bilirubin. CBC was essentially normal, specifically normal platelet count. ANA mildly elevated at a titer of 1-80 considered borderline low, but not surprising given his multiple sclerosis diagnosis. Right upper quadrant ultrasound with normal gallbladder and common bile duct, increased hepatic echotexture must come out of a with fatty infiltration. He was cleared by neurology to decrease his dose to every other day.  At his last visit he noted some fatigue related to his MS, no significant changes in MS symptoms since decreasing his dose. Rare right-sided abdominal pain for which she takes Levbid. He was requesting a prescription from [place] for this because his GI provider at [facility] was no longer there and travel to [facility] is difficult given his MS symptoms. Recommended blood work in one week, follow-up with neurology, hyoscyamine refilled, recommended 3 month follow-up.  His labs checked on 01/18/2017 on a half dose of his multiple sclerosis medication which showed his AST/ALT return to normal. He was returned to a full dose and labs were rechecked 03/02/2017 which again showed elevation in AST/ALT at 61/55. His elevation in liver enzymes is likely due to his medication as an adverse effect. No significant chronic liver disease per  workup.  Today he states   Past Medical History:  Diagnosis Date  . Elevated liver enzymes   . Headache   . MS (multiple sclerosis) (HCC)   . Tremor     Past Surgical History:  Procedure Laterality Date  . COLONOSCOPY     2-4 years ago  . HERNIA REPAIR    . KNEE SURGERY    . TESTICLE REMOVAL      Current Outpatient Prescriptions  Medication Sig Dispense Refill  . amitriptyline (ELAVIL) 10 MG tablet Take 1 tablet by mouth at bedtime.     . baclofen (LIORESAL) 10 MG tablet Take 1 tablet by mouth 3 (three) times daily.    . cetirizine (ZYRTEC) 10 MG tablet Take 10 mg by mouth daily.    . clonazePAM (KLONOPIN) 1 MG tablet Take 1 tablet by mouth 3 (three) times daily as needed for anxiety.     . Coenzyme Q10 (CO Q 10) 100 MG CAPS Take 1 tablet by mouth every morning.    . cyclobenzaprine (FLEXERIL) 10 MG tablet Take 10 mg by mouth 3 (three) times daily as needed for muscle spasms.    . fluticasone (FLONASE) 50 MCG/ACT nasal spray Place 2 sprays into both nostrils daily as needed for allergies or rhinitis.    Marland Kitchen gabapentin (NEURONTIN) 300 MG capsule Take 1 capsule by mouth 3 (three) times daily.    Marland Kitchen GILENYA 0.5 MG CAPS Take 1 capsule by mouth every other day.     . hyoscyamine (LEVBID) 0.375 MG 12 hr tablet Take 1 tablet (0.375 mg total) by mouth 2 (two) times  daily. 60 tablet 3  . ibuprofen (ADVIL,MOTRIN) 200 MG tablet Take 400 mg by mouth every 6 (six) hours as needed for moderate pain.    Marland Kitchen levETIRAcetam (KEPPRA) 250 MG tablet Take 2 tablets by mouth 3 (three) times daily.     . methylphenidate (RITALIN) 10 MG tablet Take 1 tablet by mouth as needed.     . Multiple Vitamin (MULTIVITAMIN WITH MINERALS) TABS tablet Take 1 tablet by mouth daily.    . ondansetron (ZOFRAN ODT) 4 MG disintegrating tablet Take 1 tablet (4 mg total) by mouth every 8 (eight) hours as needed for nausea or vomiting. 20 tablet 0  . pravastatin (PRAVACHOL) 20 MG tablet Take 20 mg by mouth daily.    . VESICARE  10 MG tablet Take 1 tablet by mouth daily.     No current facility-administered medications for this visit.     Allergies as of 04/14/2017 - Review Complete 01/05/2017  Allergen Reaction Noted  . Celebrex [celecoxib] Nausea And Vomiting 03/04/2013  . Vioxx [rofecoxib] Nausea And Vomiting 03/04/2013  . Zanaflex [tizanidine hcl] Nausea And Vomiting 11/30/2014    Family History  Problem Relation Age of Onset  . Colon cancer Neg Hx     Social History   Social History  . Marital status: Married    Spouse name: N/A  . Number of children: N/A  . Years of education: N/A   Social History Main Topics  . Smoking status: Never Smoker  . Smokeless tobacco: Never Used  . Alcohol use No     Comment: occasionally  . Drug use: No  . Sexual activity: Not on file   Other Topics Concern  . Not on file   Social History Narrative  . No narrative on file    Review of Systems: General: Negative for anorexia, weight loss, fever, chills, fatigue, weakness. Eyes: Negative for vision changes.  ENT: Negative for hoarseness, difficulty swallowing , nasal congestion. CV: Negative for chest pain, angina, palpitations, dyspnea on exertion, peripheral edema.  Respiratory: Negative for dyspnea at rest, dyspnea on exertion, cough, sputum, wheezing.  GI: See history of present illness. GU:  Negative for dysuria, hematuria, urinary incontinence, urinary frequency, nocturnal urination.  MS: Negative for joint pain, low back pain.  Derm: Negative for rash or itching.  Neuro: Negative for weakness, abnormal sensation, seizure, frequent headaches, memory loss, confusion.  Psych: Negative for anxiety, depression, suicidal ideation, hallucinations.  Endo: Negative for unusual weight change.  Heme: Negative for bruising or bleeding. Allergy: Negative for rash or hives.   Physical Exam: There were no vitals taken for this visit. General:   Alert and oriented. Pleasant and cooperative. Well-nourished and  well-developed.  Head:  Normocephalic and atraumatic. Eyes:  Without icterus, sclera clear and conjunctiva pink.  Ears:  Normal auditory acuity. Mouth:  No deformity or lesions, oral mucosa pink.  Throat/Neck:  Supple, without mass or thyromegaly. Cardiovascular:  S1, S2 present without murmurs appreciated. Normal pulses noted. Extremities without clubbing or edema. Respiratory:  Clear to auscultation bilaterally. No wheezes, rales, or rhonchi. No distress.  Gastrointestinal:  +BS, soft, non-tender and non-distended. No HSM noted. No guarding or rebound. No masses appreciated.  Rectal:  Deferred  Musculoskalatal:  Symmetrical without gross deformities. Normal posture. Skin:  Intact without significant lesions or rashes. Neurologic:  Alert and oriented x4;  grossly normal neurologically. Psych:  Alert and cooperative. Normal mood and affect. Heme/Lymph/Immune: No significant cervical adenopathy. No excessive bruising noted.    04/14/2017 2:01 PM  Disclaimer: This note was dictated with voice recognition software. Similar sounding words can inadvertently be transcribed and may not be corrected upon review.

## 2017-04-14 NOTE — Telephone Encounter (Signed)
Noted  

## 2017-05-10 ENCOUNTER — Other Ambulatory Visit: Payer: Self-pay | Admitting: Nurse Practitioner

## 2017-08-31 ENCOUNTER — Other Ambulatory Visit: Payer: Self-pay | Admitting: Gastroenterology

## 2017-10-13 ENCOUNTER — Ambulatory Visit: Payer: Medicare Other | Admitting: Nurse Practitioner

## 2017-12-02 ENCOUNTER — Other Ambulatory Visit: Payer: Self-pay | Admitting: Nurse Practitioner

## 2018-02-03 DIAGNOSIS — N3941 Urge incontinence: Secondary | ICD-10-CM | POA: Insufficient documentation

## 2018-02-03 DIAGNOSIS — Z9079 Acquired absence of other genital organ(s): Secondary | ICD-10-CM | POA: Insufficient documentation

## 2018-02-03 DIAGNOSIS — Z87442 Personal history of urinary calculi: Secondary | ICD-10-CM | POA: Insufficient documentation

## 2018-03-02 ENCOUNTER — Encounter: Payer: Self-pay | Admitting: Orthopaedic Surgery

## 2018-03-03 ENCOUNTER — Other Ambulatory Visit: Payer: Self-pay | Admitting: Gastroenterology

## 2018-03-08 ENCOUNTER — Encounter: Payer: Self-pay | Admitting: Orthopaedic Surgery

## 2018-03-08 ENCOUNTER — Ambulatory Visit (INDEPENDENT_AMBULATORY_CARE_PROVIDER_SITE_OTHER): Payer: Medicare Other | Admitting: Orthopaedic Surgery

## 2018-03-08 VITALS — BP 127/72 | HR 78 | Ht 75.0 in

## 2018-03-08 DIAGNOSIS — G35D Multiple sclerosis, unspecified: Secondary | ICD-10-CM | POA: Insufficient documentation

## 2018-03-08 DIAGNOSIS — G35 Multiple sclerosis: Secondary | ICD-10-CM

## 2018-03-08 DIAGNOSIS — M778 Other enthesopathies, not elsewhere classified: Secondary | ICD-10-CM

## 2018-03-08 NOTE — Progress Notes (Signed)
Subjective:    Patient ID: Vincent Anthony, male    DOB: 03-28-1971, 47 y.o.   MRN: [id number]  HPI He has a long history of Multiple Sclerosis since 2001.  He has had progressive weakness of the right upper extremity and right lower extremity.  He has a right knee brace.  He uses a walker.  He has no trauma.  Over the last several months, he has been having some right wrist pain and the feeling that something is crawling over his dorsal right hand.  He has had some numbness at times of the right little finger.  He has a very weak grip on the right and that has not changed.  He has no swelling.  He has developed some pain over the right dorsum of the wrist near the distal ulna but has no swelling, no redness.  He has no fluctuance.  He is not improving.  He is having a little more problems using his walker.    He is right hand dominant.   Review of Systems  Constitutional: Positive for activity change.  Respiratory: Negative for cough and shortness of breath.   Cardiovascular: Negative for chest pain and leg swelling.  Musculoskeletal: Positive for arthralgias, back pain, gait problem, joint swelling and myalgias.  Neurological: Positive for tremors, weakness, numbness and headaches.   Past Medical History:  Diagnosis Date  . Arthritis   . Elevated liver enzymes   . GERD (gastroesophageal reflux disease)   . Headache   . MS (multiple sclerosis) (HCC)   . Tremor     Past Surgical History:  Procedure Laterality Date  . COLONOSCOPY     2-4 years ago  . HERNIA REPAIR    . KNEE SURGERY    . TESTICLE REMOVAL      Current Outpatient Medications on File Prior to Visit  Medication Sig Dispense Refill  . amitriptyline (ELAVIL) 10 MG tablet Take 1 tablet by mouth at bedtime.     . baclofen (LIORESAL) 10 MG tablet Take 1 tablet by mouth 3 (three) times daily.    . cetirizine (ZYRTEC) 10 MG tablet Take 10 mg by mouth daily.    . clonazePAM (KLONOPIN) 1 MG tablet Take 1 tablet by  mouth 3 (three) times daily as needed for anxiety.     . Coenzyme Q10 (CO Q 10) 100 MG CAPS Take 1 tablet by mouth every morning.    . cyclobenzaprine (FLEXERIL) 10 MG tablet Take 10 mg by mouth 3 (three) times daily as needed for muscle spasms.    . fluticasone (FLONASE) 50 MCG/ACT nasal spray Place 2 sprays into both nostrils daily as needed for allergies or rhinitis.    Marland Kitchen gabapentin (NEURONTIN) 300 MG capsule Take 1 capsule by mouth 3 (three) times daily.    Marland Kitchen GILENYA 0.5 MG CAPS Take 1 capsule by mouth every other day.     . hyoscyamine (LEVBID) 0.375 MG 12 hr tablet TAKE (1) TABLET BY MOUTH EVERY TWELVE HOURS. 60 tablet 3  . ibuprofen (ADVIL,MOTRIN) 200 MG tablet Take 400 mg by mouth every 6 (six) hours as needed for moderate pain.    Marland Kitchen levETIRAcetam (KEPPRA) 250 MG tablet Take 2 tablets by mouth 3 (three) times daily.     . methylphenidate (RITALIN) 10 MG tablet Take 1 tablet by mouth as needed.     . Multiple Vitamin (MULTIVITAMIN WITH MINERALS) TABS tablet Take 1 tablet by mouth daily.    . ondansetron (ZOFRAN ODT) 4 MG  disintegrating tablet Take 1 tablet (4 mg total) by mouth every 8 (eight) hours as needed for nausea or vomiting. 20 tablet 0  . pravastatin (PRAVACHOL) 20 MG tablet Take 20 mg by mouth daily.    . VESICARE 10 MG tablet Take 1 tablet by mouth daily.     No current facility-administered medications on file prior to visit.     Social History   Socioeconomic History  . Marital status: Married    Spouse name: Not on file  . Number of children: Not on file  . Years of education: Not on file  . Highest education level: Not on file  Occupational History  . Not on file  Social Needs  . Financial resource strain: Not on file  . Food insecurity:    Worry: Not on file    Inability: Not on file  . Transportation needs:    Medical: Not on file    Non-medical: Not on file  Tobacco Use  . Smoking status: Never Smoker  . Smokeless tobacco: Never Used  Substance and  Sexual Activity  . Alcohol use: No    Comment: occasionally  . Drug use: No  . Sexual activity: Not on file  Lifestyle  . Physical activity:    Days per week: Not on file    Minutes per session: Not on file  . Stress: Not on file  Relationships  . Social connections:    Talks on phone: Not on file    Gets together: Not on file    Attends religious service: Not on file    Active member of club or organization: Not on file    Attends meetings of clubs or organizations: Not on file    Relationship status: Not on file  . Intimate partner violence:    Fear of current or ex partner: Not on file    Emotionally abused: Not on file    Physically abused: Not on file    Forced sexual activity: Not on file  Other Topics Concern  . Not on file  Social History Narrative  . Not on file    Family History  Problem Relation Age of Onset  . Heart attack Mother   . Diabetes Father   . Hypertension Father   . Colon cancer Neg Hx     BP 127/72   Pulse 78   Ht 6\' 3"  (1.905 m)   BMI 27.32 kg/m      Objective:   Physical Exam  Constitutional: He is oriented to person, place, and time. He appears well-developed and well-nourished.  HENT:  Head: Normocephalic and atraumatic.  Eyes: Pupils are equal, round, and reactive to light. Conjunctivae and EOM are normal.  Neck: Normal range of motion. Neck supple.  Cardiovascular: Normal rate, regular rhythm and intact distal pulses.  Pulmonary/Chest: Effort normal.  Abdominal: Soft.  Musculoskeletal:       Right wrist: He exhibits tenderness.       Hands: Neurological: He is alert and oriented to person, place, and time. He displays abnormal reflex. No cranial nerve deficit. He exhibits abnormal muscle tone. Coordination abnormal.  Atrophy of the right upper and lower extremities.  Decreased reflexes right.  In wheelchair, difficult to stand.  He has a walker which he used to get into the office.  Skin: Skin is warm and dry.  Psychiatric: He  has a normal mood and affect. His behavior is normal. Judgment and thought content normal.  Vitals reviewed.   I reviewed  the notes from his family doctor.      Assessment & Plan:   Encounter Diagnoses  Name Primary?  . Tendinitis of right wrist Yes  . MS (multiple sclerosis) (HCC)    I think he has tendinitis of the fifth extensor compartment of the right wrist.  He is also having some nerve paresthesias .  It is most likely from the MS but he could have some ulnar nerve tardy as well.  I have given a cock-up splint to use.  I have advised to use Aspercreme or BioFreeze to the wrist tid.  Return in two weeks.  Call if any problem.  Precautions discussed.   Electronically Signed Darreld Mclean, MD 6/11/201911:18 AM

## 2018-03-22 ENCOUNTER — Encounter: Payer: Self-pay | Admitting: Orthopaedic Surgery

## 2018-03-22 ENCOUNTER — Ambulatory Visit (INDEPENDENT_AMBULATORY_CARE_PROVIDER_SITE_OTHER): Payer: Medicare Other | Admitting: Orthopaedic Surgery

## 2018-03-22 VITALS — BP 144/80 | HR 85 | Ht 75.0 in

## 2018-03-22 DIAGNOSIS — G35 Multiple sclerosis: Secondary | ICD-10-CM | POA: Diagnosis not present

## 2018-03-22 DIAGNOSIS — M778 Other enthesopathies, not elsewhere classified: Secondary | ICD-10-CM | POA: Diagnosis not present

## 2018-03-22 NOTE — Progress Notes (Signed)
Patient ZO:XWRUEA Vincent Anthony, male DOB:28-Jan-1971, 47 y.o. VWU:[id number]  Chief Complaint  Patient presents with  . Wrist Pain    better    HPI  Vincent Anthony is a 47 y.o. male who has right wrist pain and tendinitis as well as multiple sclerosis with weakness of the right upper extremity.  He has been using the Aspercreme and that helps.  He has pain still but not as much as before.  He is limited in use of the right hand and wrist secondary to his weakness from MS.  He has no redness, no trauma. HPI  Body mass index is 27.32 kg/m.  ROS  Review of Systems  Constitutional: Positive for activity change.  Respiratory: Negative for cough and shortness of breath.   Cardiovascular: Negative for chest pain and leg swelling.  Musculoskeletal: Positive for arthralgias, back pain, gait problem, joint swelling and myalgias.  Neurological: Positive for tremors, weakness, numbness and headaches.    Past Medical History:  Diagnosis Date  . Arthritis   . Elevated liver enzymes   . GERD (gastroesophageal reflux disease)   . Headache   . MS (multiple sclerosis) (HCC)   . Tremor     Past Surgical History:  Procedure Laterality Date  . COLONOSCOPY     2-4 years ago  . HERNIA REPAIR    . KNEE SURGERY    . TESTICLE REMOVAL      Family History  Problem Relation Age of Onset  . Heart attack Mother   . Diabetes Father   . Hypertension Father   . Colon cancer Neg Hx     Social History Social History   Tobacco Use  . Smoking status: Never Smoker  . Smokeless tobacco: Never Used  Substance Use Topics  . Alcohol use: No    Comment: occasionally  . Drug use: No    Allergies  Allergen Reactions  . Celebrex [Celecoxib] Nausea And Vomiting  . Vioxx [Rofecoxib] Nausea And Vomiting  . Zanaflex [Tizanidine Hcl] Nausea And Vomiting    Current Outpatient Medications  Medication Sig Dispense Refill  . amitriptyline (ELAVIL) 10 MG tablet Take 1 tablet by mouth at bedtime.      . baclofen (LIORESAL) 10 MG tablet Take 1 tablet by mouth 3 (three) times daily.    . cetirizine (ZYRTEC) 10 MG tablet Take 10 mg by mouth daily.    . clonazePAM (KLONOPIN) 1 MG tablet Take 1 tablet by mouth 3 (three) times daily as needed for anxiety.     . Coenzyme Q10 (CO Q 10) 100 MG CAPS Take 1 tablet by mouth every morning.    . cyclobenzaprine (FLEXERIL) 10 MG tablet Take 10 mg by mouth 3 (three) times daily as needed for muscle spasms.    . fluticasone (FLONASE) 50 MCG/ACT nasal spray Place 2 sprays into both nostrils daily as needed for allergies or rhinitis.    Marland Kitchen gabapentin (NEURONTIN) 300 MG capsule Take 1 capsule by mouth 3 (three) times daily.    Marland Kitchen GILENYA 0.5 MG CAPS Take 1 capsule by mouth every other day.     . hyoscyamine (LEVBID) 0.375 MG 12 hr tablet TAKE (1) TABLET BY MOUTH EVERY TWELVE HOURS. 60 tablet 3  . ibuprofen (ADVIL,MOTRIN) 200 MG tablet Take 400 mg by mouth every 6 (six) hours as needed for moderate pain.    Marland Kitchen levETIRAcetam (KEPPRA) 250 MG tablet Take 2 tablets by mouth 3 (three) times daily.     . methylphenidate (RITALIN) 10 MG  tablet Take 1 tablet by mouth as needed.     . Multiple Vitamin (MULTIVITAMIN WITH MINERALS) TABS tablet Take 1 tablet by mouth daily.    . ondansetron (ZOFRAN ODT) 4 MG disintegrating tablet Take 1 tablet (4 mg total) by mouth every 8 (eight) hours as needed for nausea or vomiting. 20 tablet 0  . pravastatin (PRAVACHOL) 20 MG tablet Take 20 mg by mouth daily.    . VESICARE 10 MG tablet Take 1 tablet by mouth daily.     No current facility-administered medications for this visit.      Physical Exam  Blood pressure (!) 144/80, pulse 85, height 6\' 3"  (1.905 m).  Constitutional: overall normal hygiene, normal nutrition, well developed, normal grooming, normal body habitus. Assistive device:walker, cock up splint  Musculoskeletal: gait and station Limp right, muscle tone and strength are abnormal, weakness and decreased strength of the  upper extremities more on the right upper and lower .  Neurological: coordination overall abnormal.  Deep tendon reflex/nerve stretch decreased on right upper and lower..  Sensation normal.  Cranial nerves II-XII intact.   Skin:   Normal overall no scars, lesions, ulcers or rashes. No psoriasis.  Psychiatric: Alert and oriented x 3.  Recent memory intact, remote memory unclear.  Normal mood and affect. Well groomed.  Good eye contact.  Cardiovascular: overall no swelling, no varicosities, no edema bilaterally, normal temperatures of the legs and arms, no clubbing, cyanosis and good capillary refill.  Lymphatic: palpation is normal.  Right wrist is tender over lateral area but has no swelling.  Fingers and wrist weak, 2.5 to 3 over 5.   All other systems reviewed and are negative   The patient has been educated about the nature of the problem(s) and counseled on treatment options.  The patient appeared to understand what I have discussed and is in agreement with it.  Encounter Diagnoses  Name Primary?  . Tendinitis of right wrist Yes  . MS (multiple sclerosis) (HCC)     PLAN Call if any problems.  Precautions discussed.  Continue current medications.   Return to clinic 1 month   A new cock-up splint with thumb incorporated given.  Electronically Signed Darreld Mclean, MD 6/25/20199:56 AM

## 2018-04-19 ENCOUNTER — Ambulatory Visit: Payer: Medicare Other | Admitting: Orthopaedic Surgery

## 2018-04-21 ENCOUNTER — Encounter: Payer: Self-pay | Admitting: Orthopaedic Surgery

## 2018-04-21 ENCOUNTER — Ambulatory Visit (INDEPENDENT_AMBULATORY_CARE_PROVIDER_SITE_OTHER): Payer: Medicare Other | Admitting: Orthopaedic Surgery

## 2018-04-21 VITALS — BP 126/74 | HR 88 | Ht 75.0 in

## 2018-04-21 DIAGNOSIS — M778 Other enthesopathies, not elsewhere classified: Secondary | ICD-10-CM

## 2018-04-21 DIAGNOSIS — G35 Multiple sclerosis: Secondary | ICD-10-CM

## 2018-04-21 NOTE — Progress Notes (Signed)
CC:  I am better  He has been using his splint and Aspercreme.  His right wrist is less tender and has no swelling now.  He is pleased.  He has no swelling of the right wrist.  He has weakness secondary to his MS.  ROM is good.  Encounter Diagnoses  Name Primary?  . Tendinitis of right wrist Yes  . MS (multiple sclerosis) (HCC)    I will see him as needed.  Call if any problem.  Precautions discussed.   Electronically Signed Darreld Mclean, MD 7/25/20199:28 AM

## 2018-05-12 ENCOUNTER — Other Ambulatory Visit: Payer: Self-pay

## 2018-05-12 ENCOUNTER — Ambulatory Visit (HOSPITAL_COMMUNITY): Payer: Medicare Other | Attending: Nurse Practitioner | Admitting: Physical Therapy

## 2018-05-12 ENCOUNTER — Encounter (HOSPITAL_COMMUNITY): Payer: Self-pay | Admitting: Physical Therapy

## 2018-05-12 DIAGNOSIS — R2689 Other abnormalities of gait and mobility: Secondary | ICD-10-CM | POA: Diagnosis present

## 2018-05-12 DIAGNOSIS — R29818 Other symptoms and signs involving the nervous system: Secondary | ICD-10-CM | POA: Diagnosis present

## 2018-05-12 DIAGNOSIS — M6281 Muscle weakness (generalized): Secondary | ICD-10-CM | POA: Diagnosis present

## 2018-05-13 NOTE — Therapy (Signed)
[facility] [address] [place], [place], [address] Phone: [phone]   Fax:  [phone]  Physical Therapy Evaluation  Patient Details  Name: Vincent Anthony MRN: [id number] Date of Birth: 11/21/1970 Referring Provider: Joycelyn Schmid, NP   Encounter Date: 05/12/2018  PT End of Session - 05/12/18 1751    Visit Number  1    Number of Visits  9    Date for PT Re-Evaluation  06/10/18    Authorization Type  Medicare     Authorization Time Period  05/12/18- 06/10/18    PT Start Time  1405   Patient arrived late   PT Stop Time  1433    PT Time Calculation (min)  28 min    Equipment Utilized During Treatment  Other (comment)   Orthotics and walker   Activity Tolerance  Patient tolerated treatment well;No increased pain;Patient limited by fatigue    Behavior During Therapy  [facility] for tasks assessed/performed       Past Medical History:  Diagnosis Date  . Arthritis   . Elevated liver enzymes   . GERD (gastroesophageal reflux disease)   . Headache   . MS (multiple sclerosis) (HCC)   . Tremor     Past Surgical History:  Procedure Laterality Date  . COLONOSCOPY     2-4 years ago  . HERNIA REPAIR    . KNEE SURGERY    . TESTICLE REMOVAL      There were no vitals filed for this visit.   Subjective Assessment - 05/12/18 1414    Subjective  Patient reported that he was diagnosed with and has been living with relapsing remitting MS since 2001. Patient reported that he has noted that his right leg is dragging now when he is walking.  He stated that he has been a member at the gym for a while, but that he hasn't been able to go on a consistent basis for a while now. He reported that in March of 2019 he had been going to the gym 1-2 times a week. He stated he would really like to be able to go back to the gym like he was. Patient reported a lot of spasticity in his right arm. He stated his right lower extremity has an orthotic to  help with the foot drop.    Pertinent History  Diagnosis of Multiple Sclerosis    Limitations  Standing;Walking    How long can you sit comfortably?  Not limited    How long can you stand comfortably?  5 minutes    How long can you walk comfortably?  5 minutes    Patient Stated Goals  Work on right leg as far as the spasticity    Currently in Pain?  No/denies           05/12/18 0001  Assessment  Medical Diagnosis Multiple Sclerosis  Referring Provider Joycelyn Schmid, NP  Onset Date/Surgical Date  (MS since 2001)  Next MD Visit  (January or February)  Prior Therapy Yes, at this clinic in 2017 and 2016  Balance Screen  Has the patient fallen in the past 6 months No  Has the patient had a decrease in activity level because of a fear of falling?  Yes  Is the patient reluctant to leave their home because of a fear of falling?  Yes  Home Environment  Living Environment Private residence  Living Arrangements Spouse/significant other  Type of Home House  Home Layout  One level  Magazine features editor - 2 wheels;Wheelchair - Education officer, community Part time employment  [employer], phone calls  Cognition  Overall Cognitive Status Within Functional Limits for tasks assessed  Observation/Other Assessments  Observations Noted right arm girth decreased compared to left. Noted patient's right lower extremity patient circumducts and lacks knee and hip flexion.   ROM / Strength  AROM / PROM / Strength Strength  Strength  Overall Strength Comments Patient was unable to move right lower extremity into hip flexion, knee extension, or flexion on the right lower extremity in gravity position, unable to assess in gravity eliminated positon due to time. Plan to assess more fully next session.   Strength Assessment Site Hip;Knee;Ankle  Right/Left Hip Right;Left  Right/Left Knee Right;Left  Right/Left Ankle Right;Left  Left Hip Flexion 3-/5   Left Knee Flexion 4+/5  Left Knee Extension 4+/5  Transfers  Transfers Sit to Stand;Stand to Sit  Sit to Stand 6: Modified independent (Device/Increase time)  Stand to Sit 6: Modified independent (Device/Increase time)  Comments Patient required increased time and use of rolling walker to perform sit to stand/stand to sit and several attempts with sit to stand from standard height chair on some attempts.   Ambulation/Gait  Ambulation/Gait Yes  Ambulation/Gait Assistance 6: Modified independent (Device/Increase time)  Assistive device Rolling walker  Gait Pattern Decreased stride length;Decreased hip/knee flexion - right;Decreased weight shift to right;Decreased stance time - right;Right circumduction  Timed Up and Go Test  TUG Normal TUG  Normal TUG (seconds)  (2 minutes 12 seconds)  TUG Comments Patient performed with rolling walker, from standard height chair, with use of bilateral upper extremities              Objective measurements completed on examination: See above findings.              PT Education - 05/12/18 1716    Education Details  Patient was educated on examination findings and plan of care.     Person(s) Educated  Patient    Methods  Explanation    Comprehension  Verbalized understanding       PT Short Term Goals - 05/12/18 1732      PT SHORT TERM GOAL #1   Title  Patient will demonstrate understanding and report regular compliance with HEP to improve strength and functional mobility.     Time  2    Period  Weeks    Status  New    Target Date  05/26/18      PT SHORT TERM GOAL #2   Title  Patient to be able to perform sit to stand and stand from chair, wheelchair, and low mat table with correct sequencing and minimal unsteadiness for improved safety and mobility.     Time  2    Period  Weeks    Status  New    Target Date  05/27/18      PT SHORT TERM GOAL #3   Title  Patient will report a percieved improvement in ambulation without  toe drag on the right lower extremity of 25% improvement.     Time  2    Period  Weeks    Status  New    Target Date  05/27/18        PT Long Term Goals - 05/12/18 1735      PT LONG TERM GOAL #1   Title  Patient will report a percieved improvement in ambulation  without toe drag on the right lower extremity of 50% improvement.     Time  4    Period  Weeks    Status  New    Target Date  06/10/18      PT LONG TERM GOAL #2   Title  Patient will demonstrate improvement of 1/2 MMT grade in all muscle groups tested as deficient in order to assist patient with safe transfers and ambulation.     Time  4    Period  Weeks    Status  New    Target Date  06/10/18      PT LONG TERM GOAL #3   Title  Patient will demonstrate improvement of 30 seconds on TUG indicating improved balance and safety.     Time  4    Period  Weeks    Status  New    Target Date  06/10/18      PT LONG TERM GOAL #4   Title  Patient will report return to the gym on 1-2 days/week in order to continue fitness program independently.     Time  4    Period  Weeks    Status  New    Target Date  06/10/18             Plan - 05/12/18 1737    Clinical Impression Statement  Patient is a 47 year old male who presented to physical therapy with a reported decrease in functioning and increased tone of the right lower extremity. Upon examination noted decreased lower extremity strength, increased tone and decreased functional mobility. With ambulation patient used a rolling walker and demonstrated gait deficits most significantly, right toe-drag and circumduction. Patient reported he has had a decrease in his ability to consistently attend the gym as he had earlier in the year. With the TUG patient demonstrated decreased gait speed and decreased balance. Patient would benefit from continued skilled physical therapy in order to address the abovementioned deficits and help patient return to his prior level of function.      History and Personal Factors relevant to plan of care:  Diagnosis of MS    Clinical Presentation  Evolving    Clinical Presentation due to:  MMT, TUG, clinical judgement     Clinical Decision Making  Moderate    Rehab Potential  Fair    Clinical Impairments Affecting Rehab Potential  Positive: Highly motivated, Negative: progressive nature of diagnosis    PT Frequency  2x / week    PT Duration  4 weeks    PT Treatment/Interventions  ADLs/Self Care Home Management;Aquatic Therapy;Electrical Stimulation;DME Instruction;Gait training;Stair training;Functional mobility training;Therapeutic activities;Therapeutic exercise;Balance training;Neuromuscular re-education;Patient/family education;Orthotic Fit/Training;Wheelchair mobility training;Manual techniques;Passive range of motion;Energy conservation    PT Next Visit Plan  Review evaluation and goals, perform remaining MMT, perform modified ashworth scale, initiate functional lower extremity strengthening, initiate manual therapy to decrease tone of the right lower extremity and provide stretches to address this.     PT Home Exercise Plan  Initiate at first treatment session    Consulted and Agree with Plan of Care  Patient       Patient will benefit from skilled therapeutic intervention in order to improve the following deficits and impairments:  Abnormal gait, Decreased balance, Decreased endurance, Decreased mobility, Difficulty walking, Impaired sensation, Impaired tone, Improper body mechanics, Decreased activity tolerance, Decreased coordination, Decreased strength, Decreased knowledge of use of DME, Impaired flexibility, Postural dysfunction  Visit Diagnosis: Muscle weakness (generalized)  Other abnormalities  of gait and mobility  Other symptoms and signs involving the nervous system     Problem List Patient Active Problem List   Diagnosis Date Noted  . MS (multiple sclerosis) (HCC) 03/08/2018  . Abdominal pain 01/05/2017  .  Elevated LFTs 11/05/2016   Verne Carrow PT, DPT 6:15 PM, 05/13/18 [phone]  [facility] [address] [place], [place], [address] Phone: [phone]   Fax:  [phone]  Name: Vincent Anthony MRN: [id number] Date of Birth: 1971/09/05

## 2018-05-17 ENCOUNTER — Telehealth (HOSPITAL_COMMUNITY): Payer: Self-pay | Admitting: Emergency Medicine

## 2018-05-17 NOTE — Telephone Encounter (Signed)
05/17/18   Left patient a message offering a 1:00 or a 2:30 appt for today with [facility] or amy

## 2018-05-20 ENCOUNTER — Encounter

## 2018-05-24 ENCOUNTER — Ambulatory Visit (HOSPITAL_COMMUNITY): Payer: Medicare Other | Admitting: Physical Therapy

## 2018-05-24 ENCOUNTER — Encounter (HOSPITAL_COMMUNITY): Payer: Self-pay | Admitting: Physical Therapy

## 2018-05-24 DIAGNOSIS — M6281 Muscle weakness (generalized): Secondary | ICD-10-CM | POA: Diagnosis not present

## 2018-05-24 DIAGNOSIS — R2689 Other abnormalities of gait and mobility: Secondary | ICD-10-CM

## 2018-05-24 DIAGNOSIS — R29818 Other symptoms and signs involving the nervous system: Secondary | ICD-10-CM

## 2018-05-24 NOTE — Patient Instructions (Signed)
  TOES RAISES - DORSIFLEXION - BOTH Start with your feet on the ground. Next, raise up both forefeet and toes as shown as you bend at your ankle. Keep your heels on the ground the entire time. Repeat 10 Times Hold 1 Second Complete 1 Set Perform 1 Time(s) a Day   HEEL RAISES - PLANTARFLEXION - BILATERAL Start with your entire foot on the ground. Next, raise up your heels as you press your toes down. Keep your toes on the ground the entire time. Repeat 10 Times Hold 1 Second Complete 1 Set Perform 1 Time(s) a Day   Assisted Dorsiflexion Stretch with Knee Extension Now straighten their hip and knee. Slowly pull their heel up towards you, moving their toes towards themselves while supporting their leg with your opposite hand. Hold for 15 seconds using towel/sheet. Repeat 4 Times Hold 15 Seconds Complete 1 Set Perform 1 Time(s) a Day

## 2018-05-24 NOTE — Therapy (Signed)
[facility] [address] [place], [place], [address] Phone: [phone]   Fax:  [phone]  Physical Therapy Treatment  Patient Details  Name: PAOLO OKANE MRN: [id number] Date of Birth: 02/22/1971 Referring Provider: Joycelyn Schmid, NP   Encounter Date: 05/24/2018  PT End of Session - 05/24/18 1529    Visit Number  2    Number of Visits  9    Date for PT Re-Evaluation  06/10/18    Authorization Type  Medicare     Authorization Time Period  05/12/18- 06/10/18    PT Start Time  1350    PT Stop Time  1435    PT Time Calculation (min)  45 min    Equipment Utilized During Treatment  Other (comment)   Orthotics and walker   Activity Tolerance  Patient tolerated treatment well;No increased pain;Patient limited by fatigue    Behavior During Therapy  [facility] for tasks assessed/performed       Past Medical History:  Diagnosis Date  . Arthritis   . Elevated liver enzymes   . GERD (gastroesophageal reflux disease)   . Headache   . MS (multiple sclerosis) (HCC)   . Tremor     Past Surgical History:  Procedure Laterality Date  . COLONOSCOPY     2-4 years ago  . HERNIA REPAIR    . KNEE SURGERY    . TESTICLE REMOVAL      There were no vitals filed for this visit.  Subjective Assessment - 05/24/18 1528    Subjective  Patient denied any pain at this session. Stated he still feels like his right lower extremity drags as he walks.     Pertinent History  Diagnosis of Multiple Sclerosis    Limitations  Standing;Walking    How long can you sit comfortably?  Not limited    How long can you stand comfortably?  5 minutes    How long can you walk comfortably?  5 minutes    Patient Stated Goals  Work on right leg as far as the spasticity    Currently in Pain?  No/denies         [facility] PT Assessment - 05/24/18 0001      AROM   AROM Assessment Site  Ankle    Right/Left Ankle  Right    Right Ankle Dorsiflexion  14   Lacking 14  degrees     Strength   Right Hip Flexion  2-/5    Right Hip Extension  1/5    Right Hip ABduction  1/5    Right Hip ADduction  2/5    Left Hip Extension  3/5    Left Hip ABduction  3/5    Left Hip ADduction  3/5    Right Knee Flexion  1/5    Right Knee Extension  1/5    Right/Left Ankle  Right;Left    Right Ankle Dorsiflexion  1/5    Right Ankle Plantar Flexion  1/5    Left Ankle Dorsiflexion  3+/5    Left Ankle Plantar Flexion  3+/5                   [facility] Adult PT Treatment/Exercise - 05/24/18 0001      Ambulation/Gait   Ambulation/Gait  Yes    Ambulation/Gait Assistance  6: Modified independent (Device/Increase time)    Ambulation Distance (Feet)  40 Feet    Assistive device  Rolling walker    Gait Pattern  Decreased stride length;Decreased hip/knee flexion - right;Decreased weight shift to right;Decreased stance time - right;Right circumduction      Exercises   Exercises  Knee/Hip;Ankle      Knee/Hip Exercises: Stretches   Other Knee/Hip Stretches  Supine Dorsiflexion stretch with sheet 4 x 15 seconds to right lower extremity      Knee/Hip Exercises: Supine   Other Supine Knee/Hip Exercises  Hip abduction/Adduction right lower extremity x10 with manual assistance      Ankle Exercises: Seated   Heel Raises  Right;Left;10 reps    Heel Raises Limitations  Right lower extremity mental/practice/ assistance    Toe Raise  10 reps    Toe Raise Limitations  Right lower extremity mental practice/manual assistance             PT Education - 05/24/18 1528    Education Details  Educated patient on included examination findings and HEP.     Person(s) Educated  Patient    Methods  Explanation;Handout    Comprehension  Verbalized understanding       PT Short Term Goals - 05/24/18 1551      PT SHORT TERM GOAL #1   Title  Patient will demonstrate understanding and report regular compliance with HEP to improve strength and functional mobility.     Time  2     Period  Weeks    Status  On-going      PT SHORT TERM GOAL #2   Title  Patient to be able to perform sit to stand and stand from chair, wheelchair, and low mat table with correct sequencing and minimal unsteadiness for improved safety and mobility.     Time  2    Period  Weeks    Status  On-going      PT SHORT TERM GOAL #3   Title  Patient will report a percieved improvement in ambulation without toe drag on the right lower extremity of 25% improvement.     Time  2    Period  Weeks    Status  On-going        PT Long Term Goals - 05/24/18 1551      PT LONG TERM GOAL #1   Title  Patient will report a percieved improvement in ambulation without toe drag on the right lower extremity of 50% improvement.     Time  4    Period  Weeks    Status  On-going      PT LONG TERM GOAL #2   Title  Patient will demonstrate improvement of 1/2 MMT grade in all muscle groups tested as deficient in order to assist patient with safe transfers and ambulation.     Time  4    Period  Weeks    Status  On-going      PT LONG TERM GOAL #3   Title  Patient will demonstrate improvement of 30 seconds on TUG indicating improved balance and safety.     Time  4    Period  Weeks    Status  On-going      PT LONG TERM GOAL #4   Title  Patient will report return to the gym on 1-2 days/week in order to continue fitness program independently.     Time  4    Period  Weeks    Status  On-going            Plan - 05/24/18 1550    Clinical Impression Statement  This session  began by reviewing patient's evaluation and goals. Patient reported that the goals matched what he would like to accomplish in therapy. Then therapist performed some continued tests and measures of patient's lower extremity strength and ROM. Noted significant weakness in patient's right lower extremity with testing. In addition, noted decreased ankle dorsiflexion ROM of the right lower extremity. Then session progressed to patient  performing several exercises supine and seated for improved lower extremity strengthening and ROM. Patient required assistance to perform some exercises on the right lower extremity due to weakness. This session added supine stretch to increase right ankle dorsiflexion, supine hip abduction/adduction of the right lower extremity, seated heel raises, and seated toe raises. Plan to continue with functional strengthening and gait training at the next session.     Rehab Potential  Fair    Clinical Impairments Affecting Rehab Potential  Positive: Highly motivated, Negative: progressive nature of diagnosis    PT Frequency  2x / week    PT Duration  4 weeks    PT Treatment/Interventions  ADLs/Self Care Home Management;Aquatic Therapy;Electrical Stimulation;DME Instruction;Gait training;Stair training;Functional mobility training;Therapeutic activities;Therapeutic exercise;Balance training;Neuromuscular re-education;Patient/family education;Orthotic Fit/Training;Wheelchair mobility training;Manual techniques;Passive range of motion;Energy conservation    PT Next Visit Plan  Continue functional lower extremity strengthening, review HEP. Include sit to stands. Progress to gait training as able.     PT Home Exercise Plan  05/24/18: Seated heel/toe raises x 10 with assistance for right LE, supine dorsiflexion stretch 4x15 seconds 1x/day    Consulted and Agree with Plan of Care  Patient       Patient will benefit from skilled therapeutic intervention in order to improve the following deficits and impairments:  Abnormal gait, Decreased balance, Decreased endurance, Decreased mobility, Difficulty walking, Impaired sensation, Impaired tone, Improper body mechanics, Decreased activity tolerance, Decreased coordination, Decreased strength, Decreased knowledge of use of DME, Impaired flexibility, Postural dysfunction  Visit Diagnosis: Muscle weakness (generalized)  Other abnormalities of gait and mobility  Other  symptoms and signs involving the nervous system     Problem List Patient Active Problem List   Diagnosis Date Noted  . MS (multiple sclerosis) (HCC) 03/08/2018  . Abdominal pain 01/05/2017  . Elevated LFTs 11/05/2016   Verne Carrow PT, DPT 3:54 PM, 05/24/18 [phone]  [facility] [address] [place], [place], [address] Phone: [phone]   Fax:  [phone]  Name: AMOR PACKARD MRN: [id number] Date of Birth: 1971/05/07

## 2018-05-27 ENCOUNTER — Telehealth (HOSPITAL_COMMUNITY): Payer: Self-pay | Admitting: Emergency Medicine

## 2018-05-27 ENCOUNTER — Ambulatory Visit (HOSPITAL_COMMUNITY): Payer: Medicare Other | Admitting: Physical Therapy

## 2018-05-27 NOTE — Telephone Encounter (Signed)
05/27/18  pt called to cx - he said that something wasn't cooperating with him but I couldn't understand what he said

## 2018-05-31 ENCOUNTER — Ambulatory Visit (HOSPITAL_COMMUNITY): Payer: Medicare Other | Admitting: Physical Therapy

## 2018-05-31 ENCOUNTER — Telehealth (HOSPITAL_COMMUNITY): Payer: Self-pay | Admitting: Physical Therapy

## 2018-05-31 NOTE — Telephone Encounter (Signed)
He forgot he had another MD apptment at the same time today, he was really sorry and promised to come Friday if possible. NF

## 2018-06-03 ENCOUNTER — Telehealth (HOSPITAL_COMMUNITY): Payer: Self-pay | Admitting: Emergency Medicine

## 2018-06-03 ENCOUNTER — Ambulatory Visit (HOSPITAL_COMMUNITY): Payer: Medicare Other

## 2018-06-03 NOTE — Telephone Encounter (Signed)
pt left a message to cx today he said that his sides were hurting him  06/03/18

## 2018-06-06 ENCOUNTER — Ambulatory Visit (HOSPITAL_COMMUNITY): Payer: Medicare Other

## 2018-06-06 ENCOUNTER — Emergency Department (HOSPITAL_COMMUNITY): Payer: Medicare Other

## 2018-06-06 ENCOUNTER — Telehealth (HOSPITAL_COMMUNITY): Payer: Self-pay

## 2018-06-06 ENCOUNTER — Other Ambulatory Visit: Payer: Self-pay

## 2018-06-06 ENCOUNTER — Emergency Department (HOSPITAL_COMMUNITY)
Admission: EM | Admit: 2018-06-06 | Discharge: 2018-06-06 | Disposition: A | Discharge status: Home / Self Care | Payer: Medicare Other | Attending: Emergency Medicine | Admitting: Emergency Medicine

## 2018-06-06 ENCOUNTER — Encounter (HOSPITAL_COMMUNITY): Payer: Self-pay | Admitting: Emergency Medicine

## 2018-06-06 DIAGNOSIS — Z7984 Long term (current) use of oral hypoglycemic drugs: Secondary | ICD-10-CM | POA: Insufficient documentation

## 2018-06-06 DIAGNOSIS — R0789 Other chest pain: Secondary | ICD-10-CM

## 2018-06-06 DIAGNOSIS — G35 Multiple sclerosis: Secondary | ICD-10-CM | POA: Diagnosis not present

## 2018-06-06 DIAGNOSIS — Z79899 Other long term (current) drug therapy: Secondary | ICD-10-CM | POA: Insufficient documentation

## 2018-06-06 DIAGNOSIS — R079 Chest pain, unspecified: Secondary | ICD-10-CM | POA: Diagnosis present

## 2018-06-06 LAB — CBC
HCT: 44.6 % (ref 39.0–52.0)
Hemoglobin: 14.5 g/dL (ref 13.0–17.0)
MCH: 28.2 pg (ref 26.0–34.0)
MCHC: 32.5 g/dL (ref 30.0–36.0)
MCV: 86.6 fL (ref 78.0–100.0)
Platelets: 197 10*3/uL (ref 150–400)
RBC: 5.15 MIL/uL (ref 4.22–5.81)
RDW: 14.2 % (ref 11.5–15.5)
WBC: 4.5 10*3/uL (ref 4.0–10.5)

## 2018-06-06 LAB — BASIC METABOLIC PANEL
Anion gap: 10 (ref 5–15)
BUN: 16 mg/dL (ref 6–20)
CO2: 23 mmol/L (ref 22–32)
Calcium: 9.6 mg/dL (ref 8.9–10.3)
Chloride: 105 mmol/L (ref 98–111)
Creatinine, Ser: 1.16 mg/dL (ref 0.61–1.24)
GFR calc Af Amer: >60 mL/min (ref >60)
GFR calc non Af Amer: >60 mL/min (ref >60)
Glucose, Bld: 98 mg/dL (ref 70–99)
Potassium: 4.2 mmol/L (ref 3.5–5.1)
Sodium: 138 mmol/L (ref 135–145)

## 2018-06-06 LAB — TROPONIN I: Troponin I: <0.03 ng/mL (ref <0.03)

## 2018-06-06 IMAGING — DX DG CHEST 2V
3 series · 3 of 3 positions shown · non-contrast
Comparison: None

CLINICAL DATA: Chest pain.

EXAM:
CHEST - 2 VIEW

[chest pa]
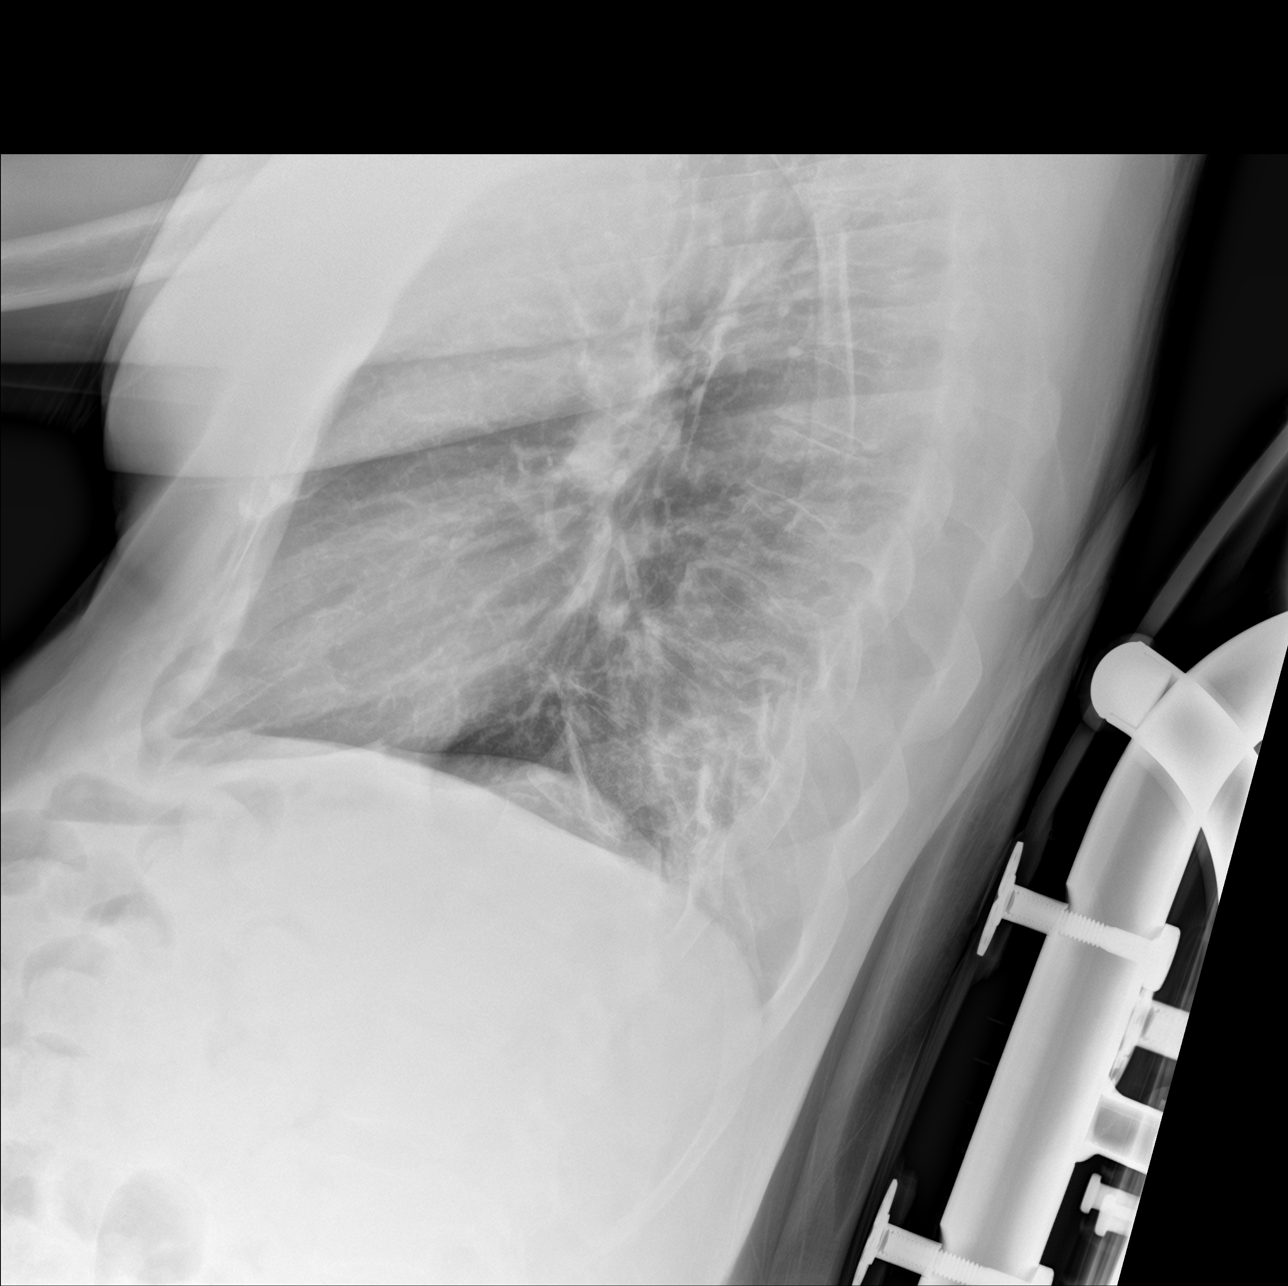

[chest ap (1 of 2)]
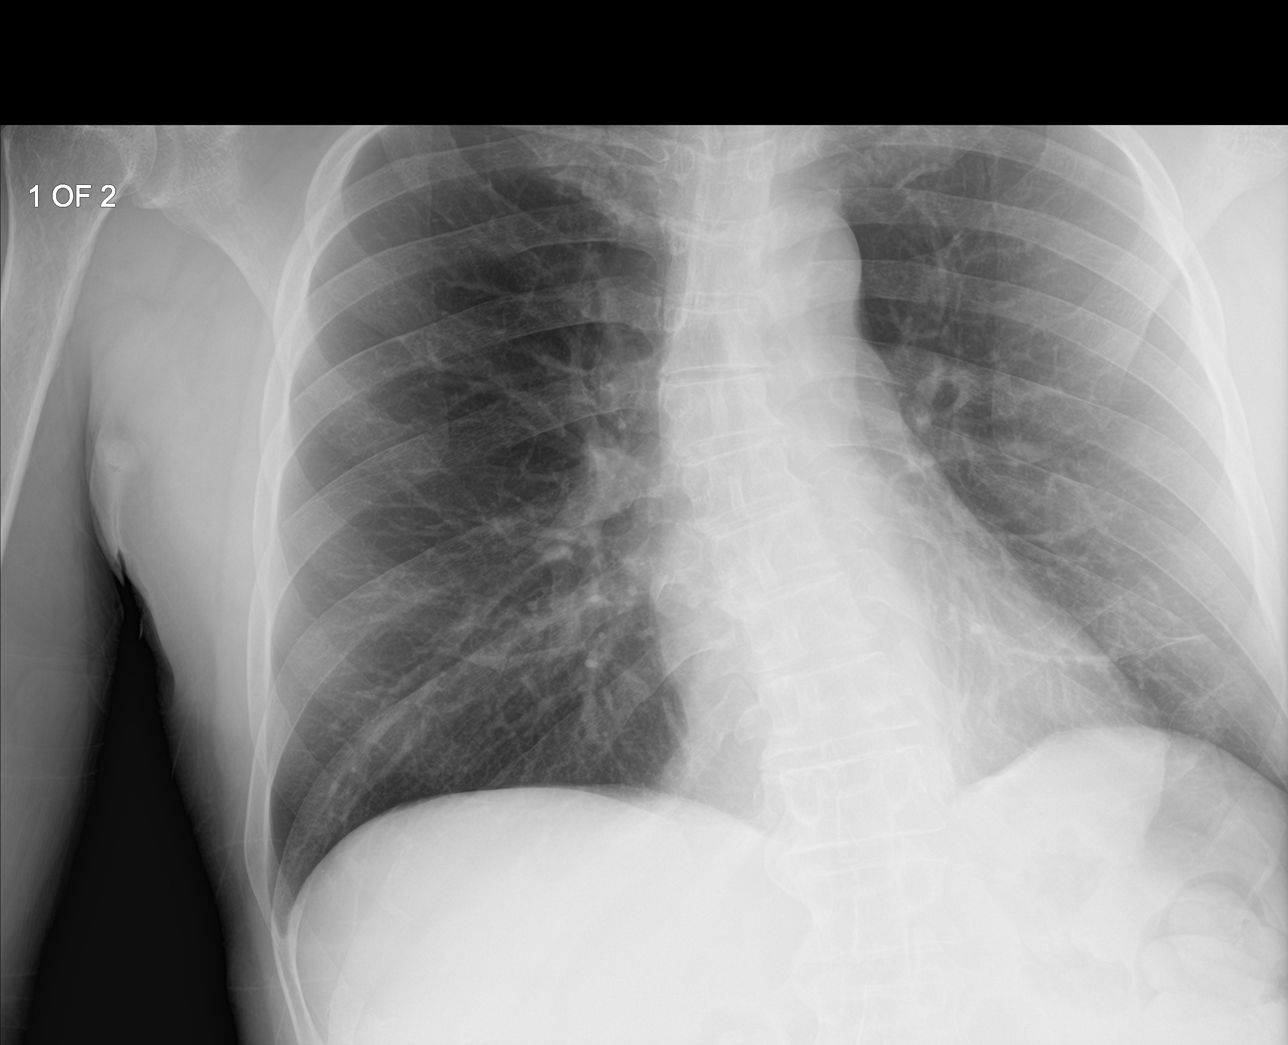

[chest ap (2 of 2)]
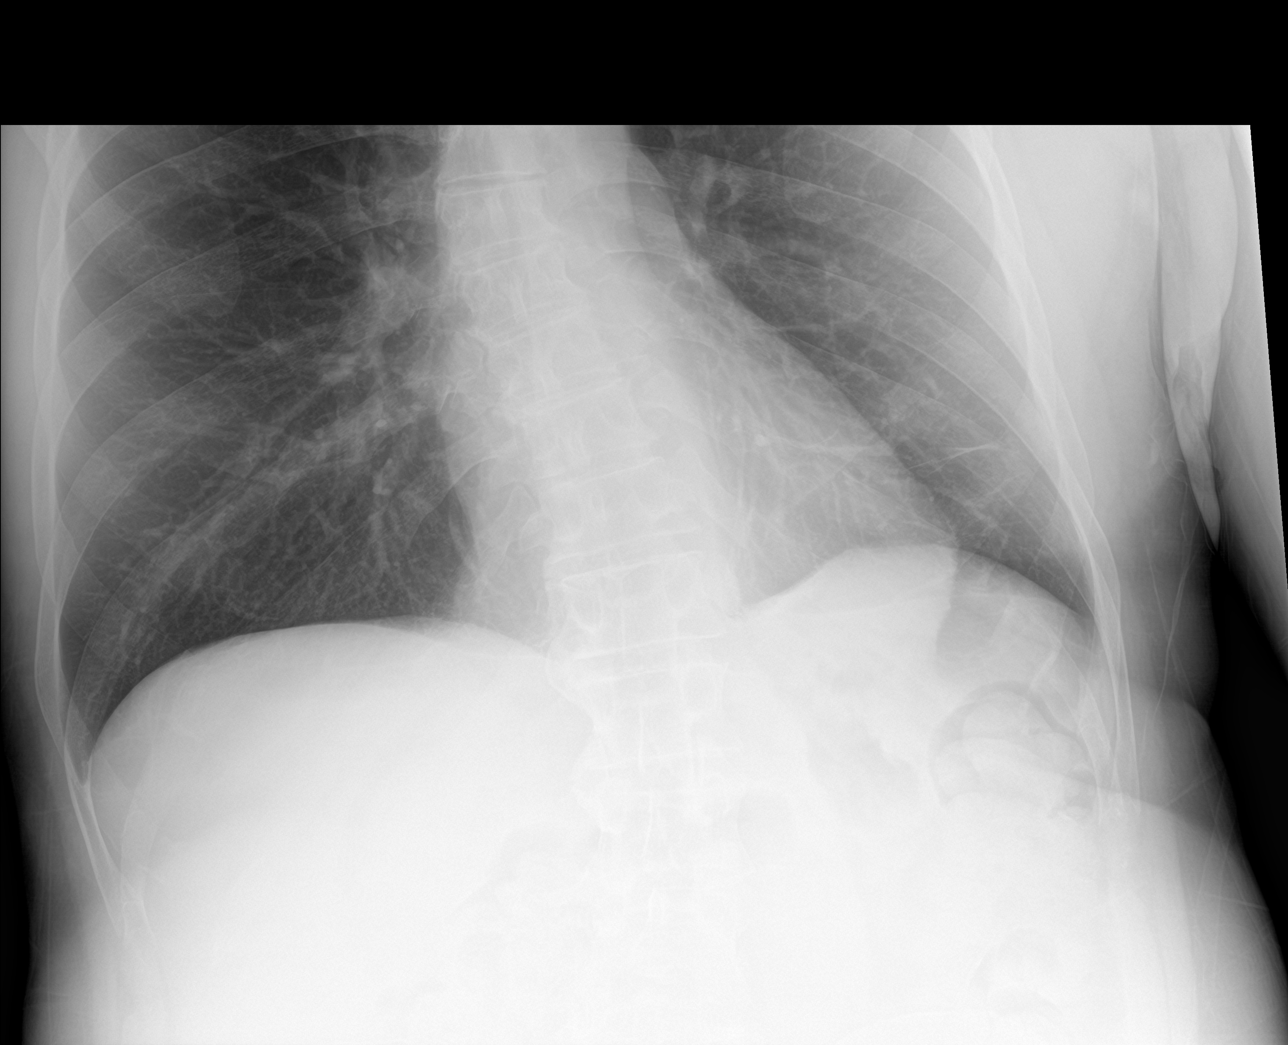

[3 of 3 positions shown; findings below may reference images not displayed]

FINDINGS: The heart size and mediastinal contours are within normal limits.
Both lungs are clear. Thoracolumbar scoliosis deformity.
IMPRESSION: No active cardiopulmonary disease.

## 2018-06-06 NOTE — Discharge Instructions (Signed)
As discussed, your evaluation today has been largely reassuring.  But, it is important that you monitor your condition carefully, and do not hesitate to return to the ED if you develop new, or concerning changes in your condition. ? ?Otherwise, please follow-up with your physician for appropriate ongoing care. ? ?

## 2018-06-06 NOTE — ED Provider Notes (Signed)
[facility] EMERGENCY DEPARTMENT Provider Note   CSN: [id number] Arrival date & time: 06/06/18  1657     History   Chief Complaint Chief Complaint  Patient presents with  . Chest Pain    HPI Vincent Anthony is a 47 y.o. male.  HPI This patient with MS, borderline diabetes mellitus presents after episodes of chest pain. He is here with his wife who assists with the HPI. They note that over the past week he has had episodes of chest pain occurring without clear precipitant, stopping without any clear intervention. Episodes are characteristically the same, beginning in the right upper chest with sharp pain, which radiates to the left. Episodes last several moments. There is no associated dyspnea, nor syncope. Beyond these episodes, patient notes that he is in his usual state of health with ongoing deficiencies secondary to MS, but no new deficiency, weakness, lightheadedness, other complaints.   Past Medical History:  Diagnosis Date  . Arthritis   . Elevated liver enzymes   . GERD (gastroesophageal reflux disease)   . Headache   . MS (multiple sclerosis) (HCC)   . Tremor     Patient Active Problem List   Diagnosis Date Noted  . MS (multiple sclerosis) (HCC) 03/08/2018  . Abdominal pain 01/05/2017  . Elevated LFTs 11/05/2016    Past Surgical History:  Procedure Laterality Date  . COLONOSCOPY     2-4 years ago  . HERNIA REPAIR    . KNEE SURGERY    . TESTICLE REMOVAL          Home Medications    Prior to Admission medications   Medication Sig Start Date End Date Taking? Authorizing Provider  amitriptyline (ELAVIL) 10 MG tablet Take 1 tablet by mouth at bedtime.  11/09/14  Yes [provider]  amphetamine-dextroamphetamine (ADDERALL) 20 MG tablet Take 20 mg by mouth daily as needed. 05/03/18  Yes [provider]  baclofen (LIORESAL) 10 MG tablet Take 1 tablet by mouth 3 (three) times daily. 11/09/14  Yes [provider]  clonazePAM  (KLONOPIN) 1 MG tablet Take 1 tablet by mouth 3 (three) times daily as needed for anxiety.  10/30/14  Yes [provider]  Coenzyme Q10 (CO Q 10) 100 MG CAPS Take 1 tablet by mouth every morning.   Yes [provider]  fluticasone (FLONASE) 50 MCG/ACT nasal spray Place 2 sprays into both nostrils daily as needed for allergies or rhinitis.   Yes [provider]  gabapentin (NEURONTIN) 300 MG capsule Take 900 mg by mouth at bedtime.  11/09/14  Yes [provider]  GILENYA 0.5 MG CAPS Take 1 capsule by mouth every other day.  11/16/14  Yes [provider]  GNP FIBER-CAPS 625 MG tablet Take 625 mg by mouth 2 (two) times daily. 05/03/18  Yes [provider]  hyoscyamine (LEVBID) 0.375 MG 12 hr tablet TAKE (1) TABLET BY MOUTH EVERY TWELVE HOURS. Patient taking differently: Take 0.375 mg by mouth every 12 (twelve) hours.  03/04/18  Yes Tiffany Kocher, PA-C  levETIRAcetam (KEPPRA) 250 MG tablet Take 2 tablets by mouth 2 (two) times daily.  11/09/14  Yes [provider]  metFORMIN (GLUCOPHAGE) 500 MG tablet Take 500 mg by mouth 2 (two) times daily.   Yes [provider]  pravastatin (PRAVACHOL) 20 MG tablet Take 20 mg by mouth every morning.    Yes [provider]  VESICARE 10 MG tablet Take 1 tablet by mouth every morning.  11/09/14  Yes [provider]  cetirizine (ZYRTEC) 10 MG tablet Take 10 mg by mouth daily.    [provider]    Family History Family History  Problem Relation Age of Onset  . Heart attack Mother   . Diabetes Father   . Hypertension Father   . Colon cancer Neg Hx     Social History Social History   Tobacco Use  . Smoking status: Never Smoker  . Smokeless tobacco: Never Used  Substance Use Topics  . Alcohol use: No    Comment: occasionally  . Drug use: No     Allergies   Celebrex [celecoxib]; Vioxx [rofecoxib]; Zanaflex [tizanidine hcl]; and Clindamycin   Review of  Systems Review of Systems  Constitutional:       Per HPI, otherwise negative  HENT:       Per HPI, otherwise negative  Respiratory:       Per HPI, otherwise negative  Cardiovascular:       Per HPI, otherwise negative  Gastrointestinal: Negative for vomiting.  Endocrine:       Negative aside from HPI  Genitourinary:       Neg aside from HPI   Musculoskeletal:       Per HPI, otherwise negative  Skin: Negative.   Allergic/Immunologic: Positive for immunocompromised state.  Neurological: Positive for weakness. Negative for syncope.     Physical Exam Updated Vital Signs BP 109/84 (BP Location: Right Arm)   Pulse 76   Temp 98 F (36.7 C) (Oral)   Resp 18   Ht 6\' 3"  (1.905 m)   Wt 96.6 kg   SpO2 97%   BMI 26.62 kg/m   Physical Exam  Constitutional: He is oriented to person, place, and time. He appears well-developed. No distress.  HENT:  Head: Normocephalic and atraumatic.  Eyes: Conjunctivae and EOM are normal.  Cardiovascular: Normal rate and regular rhythm.  Pulmonary/Chest: Effort normal. No stridor. No respiratory distress.  Abdominal: He exhibits no distension.  Musculoskeletal: He exhibits no edema.  Atrophy about the right arm, right leg, right leg in a brace.  Neurological: He is alert and oriented to person, place, and time.  Atrophy as above, decreased strength in both upper and lower extremities.  Skin: Skin is warm and dry.  Psychiatric: He has a normal mood and affect.  Nursing note and vitals reviewed.    ED Treatments / Results  Labs (all labs ordered are listed, but only abnormal results are displayed) Labs Reviewed  BASIC METABOLIC PANEL  CBC  TROPONIN I    EKG EKG Interpretation  Date/Time:  Monday June 06 2018 17:11:02 EDT Ventricular Rate:  76 PR Interval:  146 QRS Duration: 74 QT Interval:  356 QTC Calculation: 400 R Axis:   -118 Text Interpretation:  Normal sinus rhythm Right superior axis deviation Anterior injury pattern  Abnormal ekg Confirmed by Gerhard Munch [phone]) on 06/06/2018 7:20:04 PM   Radiology Dg Chest 2 View  Result Date: 06/06/2018 CLINICAL DATA:  Chest pain. EXAM: CHEST - 2 VIEW COMPARISON:  None FINDINGS: The heart size and mediastinal contours are within normal limits. Both lungs are clear. Thoracolumbar scoliosis deformity. IMPRESSION: No active cardiopulmonary disease. Electronically Signed   By: Signa Kell M.D.   On: 06/06/2018 18:15    Procedures Procedures (including critical care time)  Medications Ordered in ED Medications - No data to display   Initial Impression / Assessment and Plan / ED Course  I have reviewed the triage vital signs and  the nursing notes.  Pertinent labs & imaging results that were available during my care of the patient were reviewed by me and considered in my medical decision making (see chart for details).     8:50 PM Exam patient is in no distress, he has had no recurrence of his chest pain. We discussed need to follow-up with primary care, but given his absence of substantial risk factors, reassuring findings, there is no evidence for atypical ACS Patient also has no evidence for pneumonia, or other ongoing complaints per Patient discharged in stable condition.  Final Clinical Impressions(s) / ED Diagnoses  Atypical chest pain   Gerhard Munch, MD 06/06/18 2050

## 2018-06-06 NOTE — Telephone Encounter (Signed)
He called back after leaving a message to confrim that we cx this apptment. NF 06/06/18

## 2018-06-06 NOTE — ED Notes (Signed)
Phlebotomy drawn labs at this time and may need one more purple top when pt gets a room.

## 2018-06-06 NOTE — ED Triage Notes (Signed)
Pt c/o of central chest pain x 4 days.  VSS.

## 2018-06-10 ENCOUNTER — Ambulatory Visit (HOSPITAL_COMMUNITY): Payer: Medicare Other

## 2018-06-10 ENCOUNTER — Telehealth (HOSPITAL_COMMUNITY): Payer: Self-pay | Admitting: Emergency Medicine

## 2018-06-10 NOTE — Telephone Encounter (Signed)
06/10/18  he cx all future appts said he just wasn't up to completing therapy and when he was feeling better he would get a new order to come back

## 2018-06-13 ENCOUNTER — Ambulatory Visit (HOSPITAL_COMMUNITY): Payer: Medicare Other

## 2018-06-17 ENCOUNTER — Ambulatory Visit (HOSPITAL_COMMUNITY): Payer: Medicare Other | Admitting: Physical Therapy

## 2018-07-02 ENCOUNTER — Other Ambulatory Visit: Payer: Self-pay | Admitting: Gastroenterology

## 2018-08-04 ENCOUNTER — Encounter (HOSPITAL_COMMUNITY): Payer: Self-pay | Admitting: Physical Therapy

## 2018-08-04 NOTE — Therapy (Signed)
[facility] [place], [place], [address] Phone: [phone]   Fax:  [phone]  Patient Details  Name: Vincent Anthony MRN: [id number] Date of Birth: 1971-04-11 Referring Provider:  No ref. provider found  Encounter Date: 08/04/2018   PHYSICAL THERAPY DISCHARGE SUMMARY  Visits from Start of Care: 2   Current functional level related to goals / functional outcomes: Unable to fully assess as patient cancelled all future appointments without re-assessment. See evaluation from 05/12/18 for further detail.    Remaining deficits: Unable to fully assess as patient cancelled all future appointments without re-assessment. See evaluation from 05/12/18 for further detail.    Education / Equipment: Patient had been educated on initial HEP and benefits of physical therapy.  Plan: Patient agrees to discharge.  Patient goals were not met. Patient is being discharged due to the patient's request.  ?????             Clarene Critchley PT, DPT 1:25 PM, 08/04/18 [place], [place], [address] Phone: [phone]   Fax:  [phone]

## 2019-05-29 ENCOUNTER — Other Ambulatory Visit: Payer: Self-pay | Admitting: Gastroenterology

## 2019-12-19 ENCOUNTER — Ambulatory Visit: Payer: Medicare HMO | Admitting: Orthopaedic Surgery

## 2019-12-19 ENCOUNTER — Other Ambulatory Visit: Payer: Self-pay

## 2019-12-19 ENCOUNTER — Encounter: Payer: Self-pay | Admitting: Orthopaedic Surgery

## 2019-12-19 VITALS — Temp 97.3°F | Ht 75.0 in | Wt 220.0 lb

## 2019-12-19 DIAGNOSIS — G35 Multiple sclerosis: Secondary | ICD-10-CM

## 2019-12-19 DIAGNOSIS — S96911A Strain of unspecified muscle and tendon at ankle and foot level, right foot, initial encounter: Secondary | ICD-10-CM

## 2019-12-19 DIAGNOSIS — M21371 Foot drop, right foot: Secondary | ICD-10-CM | POA: Diagnosis not present

## 2019-12-19 NOTE — Progress Notes (Signed)
[name], male DOB:1971-08-13, 49 y.o. YDX:[id number]  Chief Complaint  Patient presents with  . Ankle Pain    R/swelling    HPI  Vincent Anthony is a 49 y.o. male who hurt his right ankle about five weeks ago.  He has had chronic swelling laterally of the right ankle and pain.  He has Multiple Sclerosis with right foot drop and weakness of the right hand.  He has a custom large brace for the right lower leg which also helps with hyperextension of the knee.  He was seen at [facility].  I have reviewed the notes and X-rays.  I have independently reviewed and interpreted x-rays of this patient done at another site by another physician or qualified health professional.  He is not getting better.   Body mass index is 27.5 kg/m.  ROS  Review of Systems  Constitutional: Positive for activity change.  Respiratory: Negative for cough and shortness of breath.   Cardiovascular: Negative for chest pain and leg swelling.  Musculoskeletal: Positive for arthralgias, back pain, gait problem, joint swelling and myalgias.  Neurological: Positive for tremors, weakness, numbness and headaches.    All other systems reviewed and are negative.  The following is a summary of the past history medically, past history surgically, known current medicines, social history and family history.  This information is gathered electronically by the computer from prior information and documentation.  I review this each visit and have found including this information at this point in the chart is beneficial and informative.    Past Medical History:  Diagnosis Date  . Arthritis   . Elevated liver enzymes   . GERD (gastroesophageal reflux disease)   . Headache   . MS (multiple sclerosis) (HCC)   . Tremor     Past Surgical History:  Procedure Laterality Date  . COLONOSCOPY     2-4 years ago  . HERNIA REPAIR    . KNEE SURGERY    . TESTICLE REMOVAL      Family  History  Problem Relation Age of Onset  . Heart attack Mother   . Diabetes Father   . Hypertension Father   . Colon cancer Neg Hx     Social History Social History   Tobacco Use  . Smoking status: Never Smoker  . Smokeless tobacco: Never Used  Substance Use Topics  . Alcohol use: No    Comment: occasionally  . Drug use: No    Allergies  Allergen Reactions  . Celebrex [Celecoxib] Nausea And Vomiting  . Vioxx [Rofecoxib] Nausea And Vomiting  . Zanaflex [Tizanidine Hcl] Nausea And Vomiting  . Clindamycin Diarrhea    Current Outpatient Medications  Medication Sig Dispense Refill  . amitriptyline (ELAVIL) 10 MG tablet Take 1 tablet by mouth at bedtime.     Marland Kitchen amphetamine-dextroamphetamine (ADDERALL) 20 MG tablet Take 20 mg by mouth daily as needed.  0  . baclofen (LIORESAL) 10 MG tablet Take 1 tablet by mouth 3 (three) times daily.    . cetirizine (ZYRTEC) 10 MG tablet Take 10 mg by mouth daily.    . clonazePAM (KLONOPIN) 1 MG tablet Take 1 tablet by mouth 3 (three) times daily as needed for anxiety.     . Coenzyme Q10 (CO Q 10) 100 MG CAPS Take 1 tablet by mouth every morning.    . fluticasone (FLONASE) 50 MCG/ACT nasal spray Place 2 sprays into both nostrils daily as needed for allergies or rhinitis.    Marland Kitchen  gabapentin (NEURONTIN) 300 MG capsule Take 900 mg by mouth at bedtime.     Marland Kitchen GILENYA 0.5 MG CAPS Take 1 capsule by mouth every other day.     Marland Kitchen GNP FIBER-CAPS 625 MG tablet Take 625 mg by mouth 2 (two) times daily.  2  . hyoscyamine (LEVBID) 0.375 MG 12 hr tablet TAKE (1) TABLET BY MOUTH EVERY TWELVE HOURS. 60 tablet 11  . levETIRAcetam (KEPPRA) 250 MG tablet Take 2 tablets by mouth 2 (two) times daily.     . metFORMIN (GLUCOPHAGE) 500 MG tablet Take 500 mg by mouth 2 (two) times daily.    . pravastatin (PRAVACHOL) 20 MG tablet Take 20 mg by mouth every morning.     . VESICARE 10 MG tablet Take 1 tablet by mouth every morning.      No current facility-administered  medications for this visit.     Physical Exam  Temperature (!) 97.3 F (36.3 C), height 6\' 3"  (1.905 m), weight 220 lb (99.8 kg).  Constitutional: overall normal hygiene, normal nutrition, well developed, normal grooming, normal body habitus. Assistive device:Walker, right lower leg brace custom  Musculoskeletal: gait and station Limp right, muscle tone and strength are normal left side, weak on the right, no tremors or atrophy is present on the left side, has large swelling laterally of the left ankle with pain over the talofibular ligament.  Sensation intact.  Foot drop left. .  Neurological: coordination overall abnormal.  Left foot drop. Deep tendon reflex/nerve stretch intact.  Sensation normal.  Cranial nerves II-XII intact.   Skin:   Normal overall no scars, lesions, ulcers or rashes. No psoriasis.  Psychiatric: Alert and oriented x 3.  Recent memory intact, remote memory unclear.  Normal mood and affect. Well groomed.  Good eye contact.  Cardiovascular: overall no swelling, no varicosities, no edema bilaterally, normal temperatures of the legs and arms, no clubbing, cyanosis and good capillary refill.  Lymphatic: palpation is normal.  All other systems reviewed and are negative   The patient has been educated about the nature of the problem(s) and counseled on treatment options.  The patient appeared to understand what I have discussed and is in agreement with it.  Encounter Diagnoses  Name Primary?  . Right ankle strain, initial encounter Yes  . MS (multiple sclerosis) ([place])   . Right foot drop     PLAN Call if any problems.  Precautions discussed.  Continue current medications. Begin compression hose.  Begin contrast baths, instructions given, elevate, ice.  Voltaren Gel as needed.  Return to clinic 2 weeks   Electronically Signed Sanjuana Kava, MD 3/23/20212:36 PM

## 2020-01-02 ENCOUNTER — Ambulatory Visit: Payer: Medicare HMO | Admitting: Orthopaedic Surgery

## 2020-02-20 ENCOUNTER — Ambulatory Visit: Payer: Medicare HMO | Admitting: Orthopaedic Surgery

## 2020-02-27 ENCOUNTER — Other Ambulatory Visit: Payer: Self-pay

## 2020-02-27 ENCOUNTER — Ambulatory Visit: Payer: Medicare HMO

## 2020-02-27 ENCOUNTER — Ambulatory Visit: Payer: Medicare HMO | Admitting: Orthopaedic Surgery

## 2020-02-27 ENCOUNTER — Encounter: Payer: Self-pay | Admitting: Orthopaedic Surgery

## 2020-02-27 VITALS — Ht 75.0 in | Wt 220.0 lb

## 2020-02-27 DIAGNOSIS — M25561 Pain in right knee: Secondary | ICD-10-CM | POA: Diagnosis not present

## 2020-02-27 DIAGNOSIS — G8929 Other chronic pain: Secondary | ICD-10-CM

## 2020-02-27 NOTE — Progress Notes (Signed)
[name], male DOB:[birth year], 50 y.o. DXI:[id number]  Chief Complaint  Patient presents with  . Knee Injury    Pt fell on rt knee 02/21/20     HPI  Vincent Anthony is a 49 y.o. male who has had ankle pain on the right.  He is much improved but he fell on 02-21-2020 and has had increasing right knee pain and giving way, swelling and popping.  He has right lower leg supportive AFO and brace because of his right sided partial paralysis from multiple sclerosis.  He is concerned he cannot stand much now and his knee is worse.  I will get X-rays today and MRI of the right knee.  He may have meniscus tear or ligamentous tear.  He is followed at [facility] for his MS and may need to be followed there if he has meniscus tear.  He understands.   Body mass index is 27.5 kg/m.  ROS  Review of Systems  Constitutional: Positive for activity change.  Respiratory: Negative for cough and shortness of breath.   Cardiovascular: Negative for chest pain and leg swelling.  Musculoskeletal: Positive for arthralgias, back pain, gait problem, joint swelling and myalgias.  Neurological: Positive for tremors, weakness, numbness and headaches.    All other systems reviewed and are negative.  The following is a summary of the past history medically, past history surgically, known current medicines, social history and family history.  This information is gathered electronically by the computer from prior information and documentation.  I review this each visit and have found including this information at this point in the chart is beneficial and informative.    Past Medical History:  Diagnosis Date  . Arthritis   . Elevated liver enzymes   . GERD (gastroesophageal reflux disease)   . Headache   . MS (multiple sclerosis) ([place])   . Tremor     Past Surgical History:  Procedure Laterality Date  . COLONOSCOPY     2-4 years ago  . HERNIA REPAIR    . KNEE SURGERY    .  TESTICLE REMOVAL      Family History  Problem Relation Age of Onset  . Heart attack Mother   . Diabetes Father   . Hypertension Father   . Colon cancer Neg Hx     Social History Social History   Tobacco Use  . Smoking status: Never Smoker  . Smokeless tobacco: Never Used  Substance Use Topics  . Alcohol use: No    Comment: occasionally  . Drug use: No    Allergies  Allergen Reactions  . Celebrex [Celecoxib] Nausea And Vomiting  . Vioxx [Rofecoxib] Nausea And Vomiting  . Zanaflex [Tizanidine Hcl] Nausea And Vomiting  . Clindamycin Diarrhea    Current Outpatient Medications  Medication Sig Dispense Refill  . amitriptyline (ELAVIL) 10 MG tablet Take 1 tablet by mouth at bedtime.     Marland Kitchen amphetamine-dextroamphetamine (ADDERALL) 20 MG tablet Take 20 mg by mouth daily as needed.  0  . baclofen (LIORESAL) 10 MG tablet Take 1 tablet by mouth 3 (three) times daily.    . cetirizine (ZYRTEC) 10 MG tablet Take 10 mg by mouth daily.    . clonazePAM (KLONOPIN) 1 MG tablet Take 1 tablet by mouth 3 (three) times daily as needed for anxiety.     . Coenzyme Q10 (CO Q 10) 100 MG CAPS Take 1 tablet by mouth every morning.    . fluticasone (FLONASE) 50 MCG/ACT nasal  spray Place 2 sprays into both nostrils daily as needed for allergies or rhinitis.    Marland Kitchen gabapentin (NEURONTIN) 300 MG capsule Take 900 mg by mouth at bedtime.     Marland Kitchen GILENYA 0.5 MG CAPS Take 1 capsule by mouth every other day.     Marland Kitchen GNP FIBER-CAPS 625 MG tablet Take 625 mg by mouth 2 (two) times daily.  2  . hyoscyamine (LEVBID) 0.375 MG 12 hr tablet TAKE (1) TABLET BY MOUTH EVERY TWELVE HOURS. 60 tablet 11  . levETIRAcetam (KEPPRA) 250 MG tablet Take 2 tablets by mouth 2 (two) times daily.     . metFORMIN (GLUCOPHAGE) 500 MG tablet Take 500 mg by mouth 2 (two) times daily.    . pravastatin (PRAVACHOL) 20 MG tablet Take 20 mg by mouth every morning.     . VESICARE 10 MG tablet Take 1 tablet by mouth every morning.      No current  facility-administered medications for this visit.     Physical Exam  Height 6\' 3"  (1.905 m), weight 220 lb (99.8 kg).  Constitutional: overall normal hygiene, normal nutrition, well developed, normal grooming, normal body habitus. Assistive device:Right lower leg AFO and anterior brace  Musculoskeletal: gait and station Limp unable to stand without pain, muscle tone and strength are abnormal on the right, no tremors but he has atrophy on right side and upper arm and hand as well.  Neurological: coordination overall abnormal.  Deep tendon reflex/nerve stretch intact.  Sensation normal.  Cranial nerves II-XII intact.   Skin:   Normal overall no scars, lesions, ulcers or rashes. No psoriasis.  Psychiatric: Alert and oriented x 3.  Recent memory intact, remote memory unclear.  Normal mood and affect. Well groomed.  Good eye contact.  Cardiovascular: overall no swelling, no varicosities, no edema bilaterally, normal temperatures of the legs and arms, no clubbing, cyanosis and good capillary refill.  Lymphatic: palpation is normal.  Right knee tender, ROM 0 to 105, effusion, crepitus, in brace, pain medially, positive medial [place], weakness on the right.  All other systems reviewed and are negative   The patient has been educated about the nature of the problem(s) and counseled on treatment options.  The patient appeared to understand what I have discussed and is in agreement with it.  Encounter Diagnosis  Name Primary?  . Chronic pain of right knee Yes   X-rays were done of the right knee, reported separately.  PLAN Call if any problems.  Precautions discussed.  Continue current medications.   Return to clinic 2 weeks   Get MRI of the right knee.  Electronically Signed , MD 6/1/20213:06 PM

## 2020-03-06 ENCOUNTER — Telehealth: Payer: Self-pay | Admitting: Orthopaedic Surgery

## 2020-03-18 ENCOUNTER — Ambulatory Visit (HOSPITAL_COMMUNITY)
Admission: RE | Admit: 2020-03-18 | Discharge: 2020-03-18 | Disposition: A | Discharge status: Home / Self Care | Payer: Medicare HMO | Source: Ambulatory Visit | Attending: Orthopaedic Surgery | Admitting: Orthopaedic Surgery

## 2020-03-18 ENCOUNTER — Other Ambulatory Visit: Payer: Self-pay

## 2020-03-18 DIAGNOSIS — G8929 Other chronic pain: Secondary | ICD-10-CM | POA: Insufficient documentation

## 2020-03-18 DIAGNOSIS — M25561 Pain in right knee: Secondary | ICD-10-CM | POA: Insufficient documentation

## 2020-03-18 IMAGING — MR MR KNEE*R* W/O CM
6 series · 38 of 40 positions shown · non-contrast
Comparison: Radiographs dated [DATE]

CLINICAL DATA: Increasing right knee pain and giving way with
swelling and popping. The patient fell on [DATE].

EXAM:
MRI OF THE RIGHT KNEE WITHOUT CONTRAST
TECHNIQUE: Multiplanar, multisequence MR imaging of the knee was performed. No
intravenous contrast was administered.

[Series 5: T2 fat-sat · axial · right · 4.0mm · 0.50mm/px · z∈[-77,+63]mm · 7 of 33 slices shown (1 of 3)]
[im 1/33]
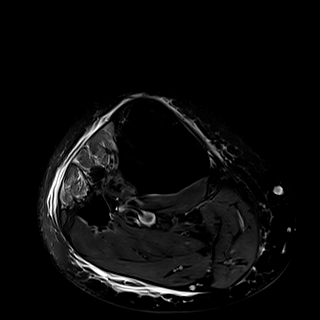
[im 6/33]
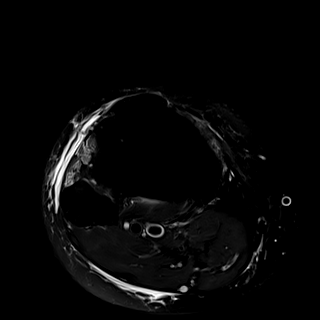
[im 11/33]
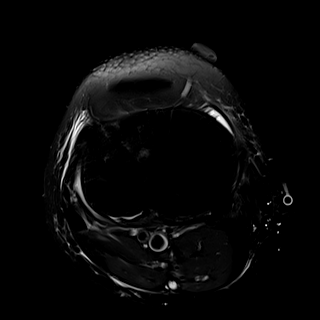
[im 17/33]
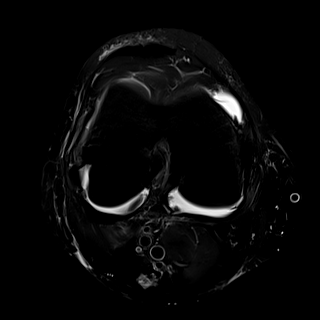
[im 22/33]
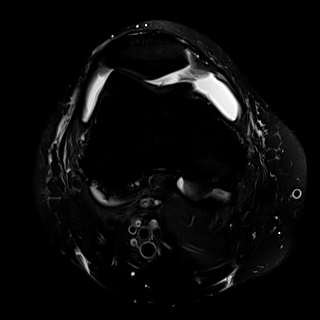
[im 27/33]
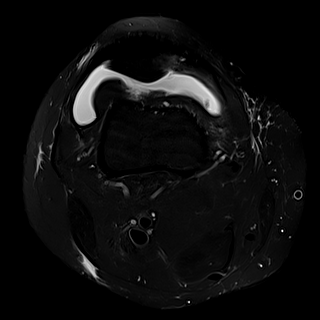
[im 33/33]
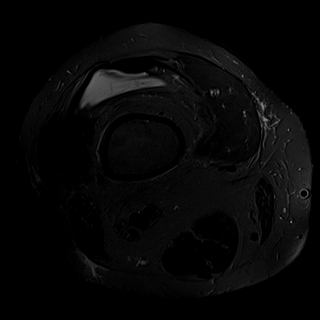

[Series 6: T1 · coronal · right · 4.0mm · 0.29mm/px · 5 of 26 slices shown]
[im 1/26]
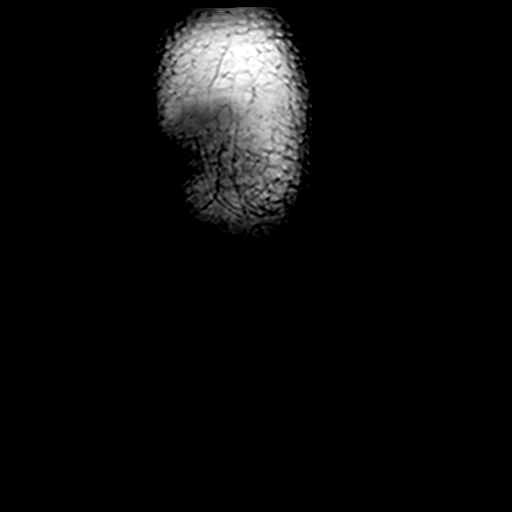
[im 5/26]
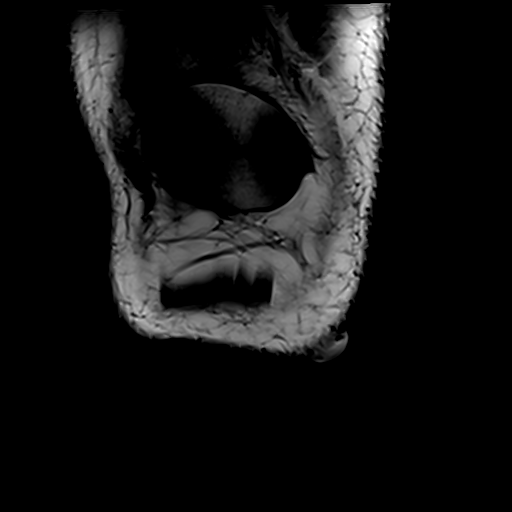
[im 9/26]
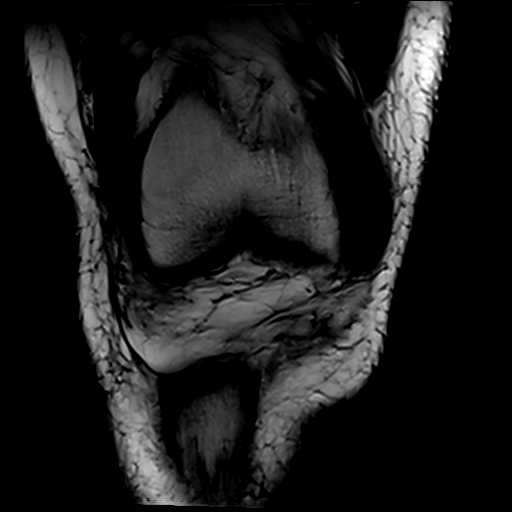
[im 13/26]
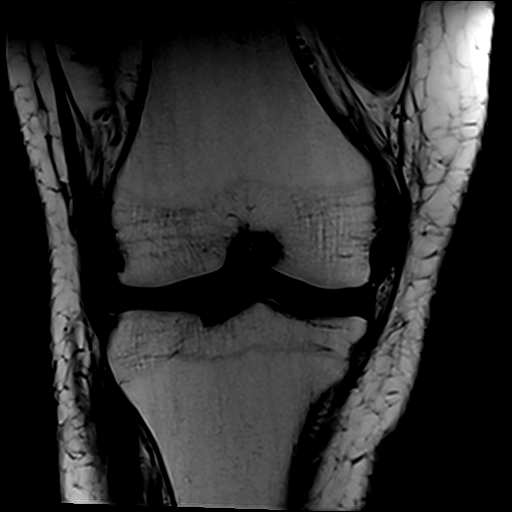
[im 17/26]
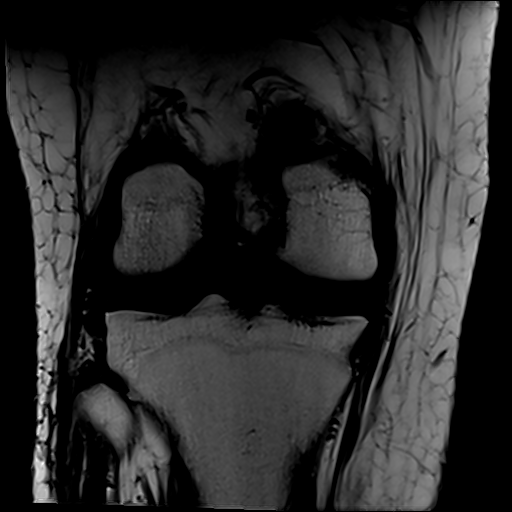

[Series 7: T2 fat-sat · coronal · right · 4.0mm · 0.59mm/px · 7 of 26 slices shown (2 of 3)]
[im 1/26]
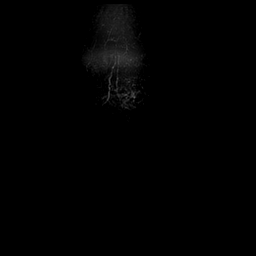
[im 5/26]
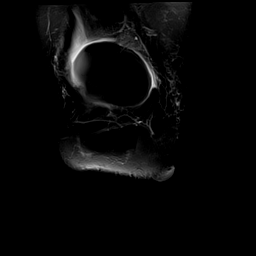
[im 9/26]
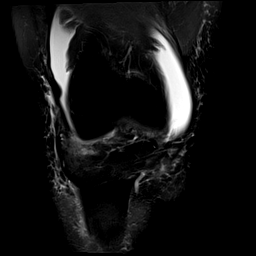
[im 13/26]
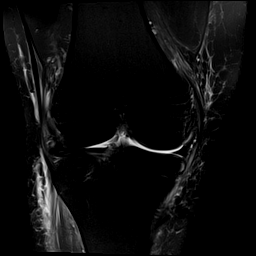
[im 17/26]
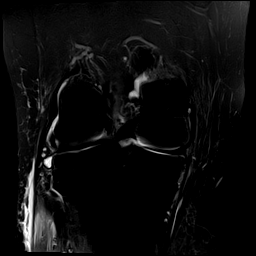
[im 21/26]
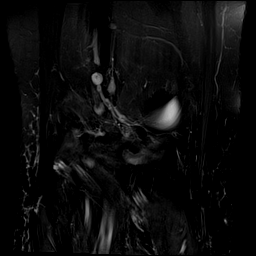
[im 26/26]
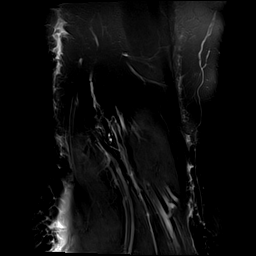

[Series 8: PD fat-sat · coronal · right · 4.0mm · 0.47mm/px · 7 of 26 slices shown (1 of 2)]
[im 1/26]
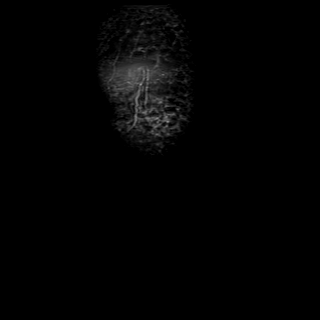
[im 5/26]
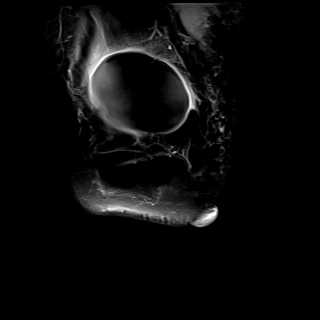
[im 9/26]
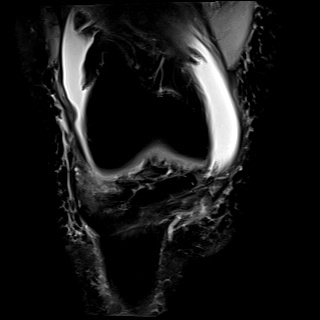
[im 13/26]
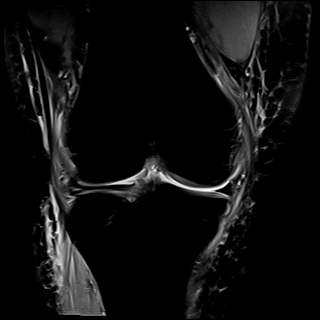
[im 17/26]
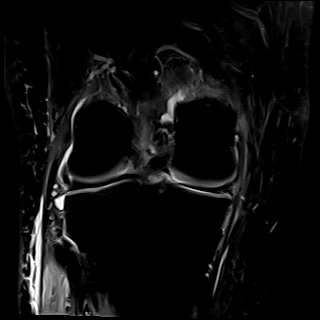
[im 21/26]
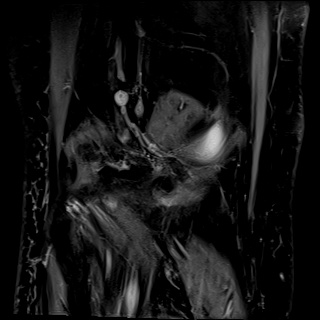
[im 26/26]
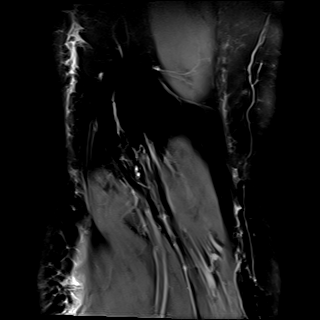

[Series 9: PD fat-sat · sagittal · right · 4.0mm · 0.47mm/px · 6 of 23 slices shown (2 of 2)]
[im 1/23]
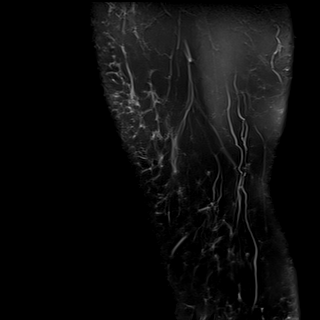
[im 5/23]
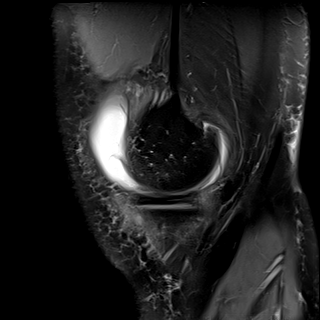
[im 9/23]
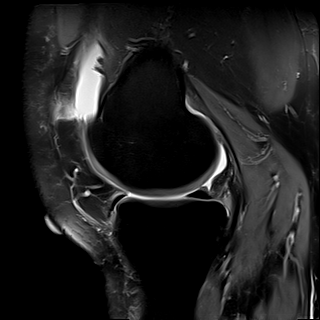
[im 14/23]
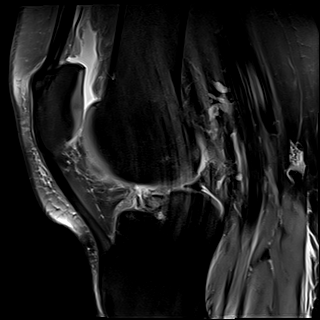
[im 18/23]
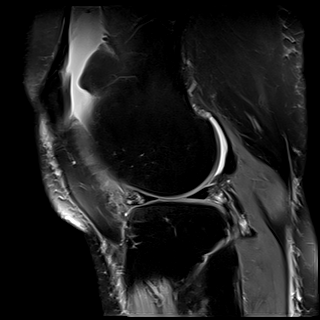
[im 23/23]
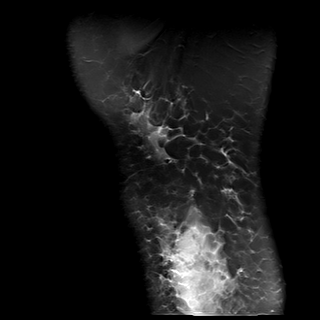

[Series 10: T2 fat-sat · sagittal · right · 4.0mm · 0.47mm/px · 6 of 23 slices shown (3 of 3)]
[im 1/23]
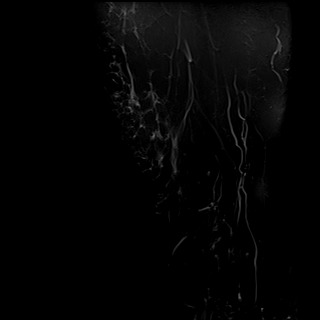
[im 5/23]
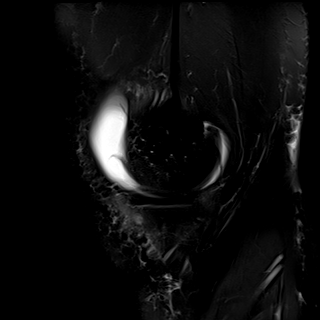
[im 9/23]
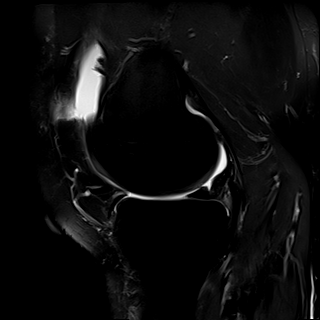
[im 14/23]
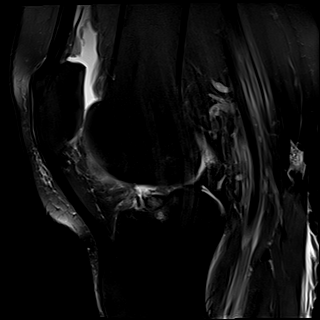
[im 18/23]
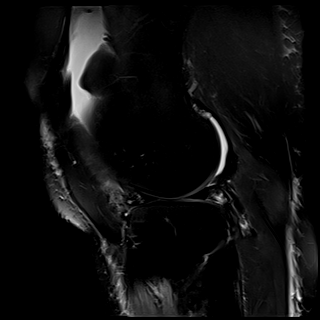
[im 23/23]
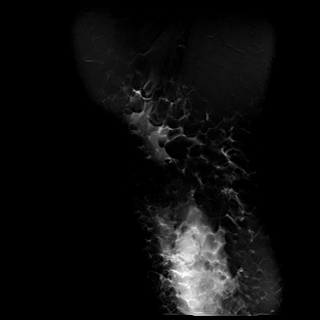

[38 of 40 positions shown; findings below may reference images not displayed]

FINDINGS: MENISCI

Medial meniscus: There is focal degeneration of the undersurface of
the posterior horn at the posteromedial corner without a discrete
tear.

Lateral meniscus: There is blunting of the free edge of the midbody
with degeneration of the anterior horn without a discrete tear.

LIGAMENTS

Cruciates:  Intact.

Collaterals: Collateral ligaments are intact. There is slight edema
superficial and deep to the MCL and LCL.

CARTILAGE

Patellofemoral:  Normal.

Medial:  Normal.

Lateral:  Normal.

Joint: Moderate joint effusion. No plical thickening. Edema in
Hoffa's fat pad adjacent to the anterior horn of the lateral
meniscus, nonspecific.

Popliteal Fossa:  No Baker's cyst.  Intact popliteus tendon.

Extensor Mechanism:  Normal.

Bones:  Normal.

Other: There is prominent edema in the soft tissues superficial to
the anterolateral aspect of the proximal right lower leg with edema
in the muscles of the anterior compartment of the lower leg. There
is also subcutaneous edema at the posterior aspect of the proximal
lower leg. These findings suggest muscle strain and/or soft tissue
contusion of the anterolateral aspect of the proximal lower leg.
IMPRESSION: 1. Prominent edema in the soft tissues superficial to the
anterolateral aspect of the proximal right lower leg with edema in
the muscles of the anterior compartment of the lower leg. These
findings suggest muscle strain and/or soft tissue contusion.
2. Moderate joint effusion.
3. Focal degeneration of the undersurface of the posterior horn of
the medial meniscus at the posteromedial corner.
4. Blunting of the free edge of the midbody of the lateral meniscus
with degeneration of the anterior horn without a discrete tear.

## 2020-03-21 ENCOUNTER — Other Ambulatory Visit: Payer: Self-pay

## 2020-03-21 ENCOUNTER — Encounter: Payer: Self-pay | Admitting: Orthopaedic Surgery

## 2020-03-21 ENCOUNTER — Ambulatory Visit (INDEPENDENT_AMBULATORY_CARE_PROVIDER_SITE_OTHER): Payer: Medicare HMO | Admitting: Orthopaedic Surgery

## 2020-03-21 VITALS — BP 140/83 | HR 78 | Ht 75.0 in | Wt 220.0 lb

## 2020-03-21 DIAGNOSIS — M25561 Pain in right knee: Secondary | ICD-10-CM | POA: Diagnosis not present

## 2020-03-21 DIAGNOSIS — G35 Multiple sclerosis: Secondary | ICD-10-CM | POA: Diagnosis not present

## 2020-03-21 DIAGNOSIS — M21371 Foot drop, right foot: Secondary | ICD-10-CM

## 2020-03-21 DIAGNOSIS — G8929 Other chronic pain: Secondary | ICD-10-CM

## 2020-03-21 NOTE — Progress Notes (Signed)
Patient WU:[id number] Vincent Anthony, male DOB:10-30-1970, 49 y.o. YNW:[id number]  Chief Complaint  Patient presents with  . Knee Pain    right   . Results    review MRI     HPI  Vincent Anthony is a 49 y.o. male who has right knee pain.  He has MS and has prosthesis support for the right lower extremity.  He had MRI which showed: IMPRESSION: 1. Prominent edema in the soft tissues superficial to the anterolateral aspect of the proximal right lower leg with edema in the muscles of the anterior compartment of the lower leg. These findings suggest muscle strain and/or soft tissue contusion. 2. Moderate joint effusion. 3. Focal degeneration of the undersurface of the posterior horn of the medial meniscus at the posteromedial corner. 4. Blunting of the free edge of the midbody of the lateral meniscus with degeneration of the anterior horn without a discrete tear.  I have explained the findings to him.  I would not recommend any surgery.  His wife was also present.  I spent some time with them, 30 minutes, going over the MRI, and recommendations for his knee and brace.  I will have him see [facility] to get a new brace/prosthesis.  It will need to be light enough for him to use.  His MS has progressively gotten worse and he is weaker now than when he first got the brace.  He goes into hyperextension of the knee more now.  He and his wife appeared to understand.  Body mass index is 27.5 kg/m.  ROS  Review of Systems  Constitutional: Positive for activity change.  Respiratory: Negative for cough and shortness of breath.   Cardiovascular: Negative for chest pain and leg swelling.  Musculoskeletal: Positive for arthralgias, back pain, gait problem, joint swelling and myalgias.  Neurological: Positive for tremors, weakness, numbness and headaches.    All other systems reviewed and are negative.  The following is a summary of the past history medically, past history surgically,  known current medicines, social history and family history.  This information is gathered electronically by the computer from prior information and documentation.  I review this each visit and have found including this information at this point in the chart is beneficial and informative.    Past Medical History:  Diagnosis Date  . Arthritis   . Elevated liver enzymes   . GERD (gastroesophageal reflux disease)   . Headache   . MS (multiple sclerosis) ([place])   . Tremor     Past Surgical History:  Procedure Laterality Date  . COLONOSCOPY     2-4 years ago  . HERNIA REPAIR    . KNEE SURGERY    . TESTICLE REMOVAL      Family History  Problem Relation Age of Onset  . Heart attack Mother   . Diabetes Father   . Hypertension Father   . Colon cancer Neg Hx     Social History Social History   Tobacco Use  . Smoking status: Never Smoker  . Smokeless tobacco: Never Used  Substance Use Topics  . Alcohol use: No    Comment: occasionally  . Drug use: No    Allergies  Allergen Reactions  . Celebrex [Celecoxib] Nausea And Vomiting  . Vioxx [Rofecoxib] Nausea And Vomiting  . Zanaflex [Tizanidine Hcl] Nausea And Vomiting  . Clindamycin Diarrhea    Current Outpatient Medications  Medication Sig Dispense Refill  . amitriptyline (ELAVIL) 10 MG tablet Take 1 tablet by mouth  at bedtime.     Marland Kitchen amphetamine-dextroamphetamine (ADDERALL) 20 MG tablet Take 20 mg by mouth daily as needed.  0  . baclofen (LIORESAL) 10 MG tablet Take 1 tablet by mouth 3 (three) times daily.    . cetirizine (ZYRTEC) 10 MG tablet Take 10 mg by mouth daily.    . clonazePAM (KLONOPIN) 1 MG tablet Take 1 tablet by mouth 3 (three) times daily as needed for anxiety.     . Coenzyme Q10 (CO Q 10) 100 MG CAPS Take 1 tablet by mouth every morning.    . fluticasone (FLONASE) 50 MCG/ACT nasal spray Place 2 sprays into both nostrils daily as needed for allergies or rhinitis.    Marland Kitchen gabapentin (NEURONTIN) 300 MG capsule Take  900 mg by mouth at bedtime.     Marland Kitchen GNP FIBER-CAPS 625 MG tablet Take 625 mg by mouth 2 (two) times daily.  2  . hyoscyamine (LEVBID) 0.375 MG 12 hr tablet TAKE (1) TABLET BY MOUTH EVERY TWELVE HOURS. 60 tablet 11  . ibuprofen (ADVIL) 800 MG tablet     . levETIRAcetam (KEPPRA) 250 MG tablet Take 2 tablets by mouth 2 (two) times daily.     . metFORMIN (GLUCOPHAGE) 500 MG tablet Take 500 mg by mouth 2 (two) times daily.    . pravastatin (PRAVACHOL) 20 MG tablet Take 20 mg by mouth every morning.     . VESICARE 10 MG tablet Take 1 tablet by mouth every morning.     Marland Kitchen GILENYA 0.5 MG CAPS Take 1 capsule by mouth every other day.  (Patient not taking: Reported on 03/21/2020)     No current facility-administered medications for this visit.     Physical Exam  Blood pressure 140/83, pulse 78, height 6\' 3"  (1.905 m), weight 220 lb (99.8 kg).  Constitutional: overall normal hygiene, normal nutrition, well developed, normal grooming, normal body habitus. Assistive device:brace right lower leg   Musculoskeletal: gait and station Limp unable to stand secondary to MS, muscle tone and strength are abnormal, no tremors are present but atrophy is present.  .  Neurological: coordination overall abnormal.  Deep tendon reflex/nerve stretch intact.  Sensation normal.  Cranial nerves II-XII intact.   Skin:   Normal overall no scars, lesions, ulcers or rashes. No psoriasis.  Psychiatric: Alert and oriented x 3.  Recent memory intact, remote memory unclear.  Normal mood and affect. Well groomed.  Good eye contact.  Cardiovascular: overall no swelling, no varicosities, no edema bilaterally, normal temperatures of the legs and arms, no clubbing, cyanosis and good capillary refill.  Lymphatic: palpation is normal.  Right knee is tender, some effusion, limited ROM secondary to weakness.  All other systems reviewed and are negative   The patient has been educated about the nature of the problem(s) and counseled  on treatment options.  The patient appeared to understand what I have discussed and is in agreement with it.  Encounter Diagnoses  Name Primary?  . Chronic pain of right knee Yes  . MS (multiple sclerosis) ([facility])   . Right foot drop     PLAN Call if any problems.  Precautions discussed.  Continue current medications.   Return to clinic 1 month   Go to .  Electronically Signed [employer], MD 6/24/202110:54 AM

## 2020-03-25 ENCOUNTER — Other Ambulatory Visit (HOSPITAL_COMMUNITY): Payer: Medicare HMO

## 2020-03-28 ENCOUNTER — Ambulatory Visit: Payer: Medicare HMO | Admitting: Orthopaedic Surgery

## 2020-04-02 ENCOUNTER — Ambulatory Visit: Payer: Medicare HMO | Admitting: Orthopaedic Surgery

## 2020-04-23 ENCOUNTER — Ambulatory Visit: Payer: Medicare HMO | Admitting: Orthopaedic Surgery

## 2020-05-16 ENCOUNTER — Other Ambulatory Visit: Payer: Self-pay | Admitting: Gastroenterology

## 2020-05-16 NOTE — Telephone Encounter (Signed)
Sending in 3 month supply of hyoscyamine. Patient hasn't been seen since 2018. He will need office visit with Wynne Dust, NP for additional refills.

## 2020-05-17 ENCOUNTER — Encounter: Payer: Self-pay | Admitting: Internal Medicine

## 2020-05-17 NOTE — Telephone Encounter (Signed)
Left a detailed message for pt. Pt needs to schedule ov prior to getting additional refills.

## 2020-07-09 ENCOUNTER — Ambulatory Visit: Payer: Medicare HMO | Admitting: Gastroenterology

## 2020-07-10 ENCOUNTER — Encounter: Payer: Self-pay | Admitting: Nurse Practitioner

## 2020-07-10 ENCOUNTER — Telehealth (INDEPENDENT_AMBULATORY_CARE_PROVIDER_SITE_OTHER): Payer: Medicare HMO | Admitting: Nurse Practitioner

## 2020-07-10 ENCOUNTER — Telehealth: Payer: Self-pay | Admitting: *Deleted

## 2020-07-10 DIAGNOSIS — R7989 Other specified abnormal findings of blood chemistry: Secondary | ICD-10-CM

## 2020-07-10 DIAGNOSIS — R1084 Generalized abdominal pain: Secondary | ICD-10-CM

## 2020-07-10 MED ORDER — HYOSCYAMINE SULFATE ER 0.375 MG PO TB12: ORAL_TABLET | ORAL | 2 refills | Status: DC

## 2020-07-10 NOTE — Progress Notes (Signed)
° ° °Referring Provider: Kikel, Stephen, MD °Primary Care Physician:  Kikel, Stephen, MD °Primary GI:  Dr. Rourk ° °NOTE: Service was provided via telemedicine and was requested by the patient due to COVID-19 pandemic. ° °Patient Location: Home ° °Provider Location: [facility] office ° °Reason for Phone Visit: Follow-up, medication refills ° °Persons present on the phone encounter, with roles: Patient, myself (provider), Mindy Estudillo, CMA (updated meds and allergies) ° °Total time (minutes) spent on medical discussion: 28 minutes ° °Due to COVID-19, visit was conducted using the virtual method noted. Visit was requested by patient. ° °I connected with Mansur Kilner on 07/10/20 at  2:30 PM EDT by Video Call and verified that I am speaking with the correct person using two identifiers. °  °I discussed the limitations, risks, security and privacy concerns of performing an evaluation and management service by telephone and the availability of in person appointments. I also discussed with the patient that there may be a patient responsible charge related to this service. The patient expressed understanding and agreed to proceed. ° °Chief Complaint  °Patient presents with  °• elevated LFT's.  °  doing okay  ° ° °HPI:   °Vincent Anthony is a 49 y.o. male who presents for virtual visit regarding: Follow-up and medication refills.  The patient was last seen in our office 01/05/2017 for elevated LFTs and generalized abdominal pain.  Noted history of multiple sclerosis on medication (Gilenya) with drug profile noting a 10 to 15% increase in elevated AST and or AL T.  PCP noted upward trend of LFTs felt likely medication effect.  Hepatic work-up found negative for viral hepatitis, persistent transaminase elevation at 50/108 and mildly elevated alk phos at 143 but normal bilirubin.  ANA mildly elevated with a titer of 1-80 considered borderline low which is not surprising considering his multiple sclerosis.  Other autoimmune  labs normal.  Right upper quadrant ultrasound found increased hepatic echotexture most compatible with fatty infiltrative change.  Cleared by neurology to decrease his dose to every other day which is recommended in order to trend his LFTs. ° °At his last visit he noted fatigue due to MS, no significant changes in his MS symptoms since decreasing his medication to every other day.  Rare right sided abdominal pain that he takes Lopid for.  Previous GI at [facility] is no longer there and needs a refill of Levbid.  No other overt GI complaints.  Recommended updated labs, follow-up with urology, refill of hyoscyamine (Levbid), follow-up in 3 months.  Information on fatty liver disease was provided. ° °HFP completed 01/18/2017 on decreased dose of MS medication found improved alk phos but still mildly elevated at 122, normalized transaminases now at AST/ALT of 31/42.  Bilirubin remains normal.  Recommended defer to neurology but okay to increase MS medication to full dose and recheck LFTs in a month to see if they elevated again.  These results and recommendations were faxed to neurology. ° °Recheck of LFTs 03/02/2017 on full dose MS medications with alkaline phosphatase now normalized at 109.  However, his AST/ALT did increase mildly again to 61/55.  Overall this is likely due to medication effect.  We faxed results to neurology and offered any assistance in decision making regarding his medication.  Overall felt to be low risk for cirrhosis due to medication.  Neurology recommended the patient have his labs rechecked in August but he requested to have these done locally rather than [place]. ° °No further LFTs were completed in our   system.  However, review of care everywhere found normal LFTs in November 2018, mild transaminitis 11/02/2018 with AST/ALT of 41/59, currently normalized on 01/15/2020 at 19/23, mild bump 03/20/2020 at 39/56.  Since February 2020 he has had an increase in his alkaline phosphatase from 105 to 128  to 141. ° °Unfortunately, the patient was lost to follow-up as he was a no-show for his visit on 04/14/2017. ° °Today he states he is doing okay overall. He had a fall in May of this year, initially felt ok, but after he went to bed he started having knee and ankle pain and XRays showed strained ankle and foot. He saw Ortho who referred him for an MRI which showed a small bit of torn cartilage; told it would take a while to heal due to MS. 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 He is now quite limited in his mobility. He is doing well regarding his LFTs and his MS medications. He is currently on Ocrevus IV for MS. His neurologist is tracking his LFTs. He is still on hyoscyamine twice daily for abdominal pain and colon spasms, which works well for him. No ongoing abdominal pain, N/V, diarrhea, constipation, hematochezia, melena, fever, chills, unintentional weight loss. Denies URI or flu-like symptoms. Denies loss of sense of taste or smell. The patient has received COVID-19 vaccination(s). Denies chest pain, dyspnea, dizziness, lightheadedness, syncope, near syncope. Denies any other upper or lower GI symptoms. ° °Past Medical History:  °Diagnosis Date  °• Arthritis   °• Elevated LFTs   °• Elevated liver enzymes   °• GERD (gastroesophageal reflux disease)   °• Headache   °• MS (multiple sclerosis) ([facility])   °• Tremor   ° ° °Past Surgical History:  °Procedure Laterality Date  °• COLONOSCOPY    ° 2-4 years ago  °• HERNIA REPAIR    °• KNEE SURGERY    °• TESTICLE REMOVAL    ° ° °Current Outpatient Medications  °Medication Sig Dispense Refill  °• amitriptyline (ELAVIL) 10 MG tablet Take 1 tablet by mouth at bedtime.     °• amphetamine-dextroamphetamine (ADDERALL) 20 MG tablet Take 20 mg by mouth daily as needed.  0  °• baclofen (LIORESAL) 10 MG tablet Take 1 tablet by mouth 3 (three) times daily.    °• cetirizine (ZYRTEC) 10 MG  tablet Take 10 mg by mouth daily.    °• Cholecalciferol (VITAMIN D3) 50 MCG (2000 UT) TABS Take 2,000 Units by mouth in the morning.    °• clonazePAM (KLONOPIN) 1 MG tablet Take 1 tablet by mouth 2 (two) times daily. Or as needed    °• Coenzyme Q10 (CO Q 10) 100 MG CAPS Take 1 tablet by mouth every morning.    °• fluticasone (FLONASE) 50 MCG/ACT nasal spray Place 2 sprays into both nostrils daily as needed for allergies or rhinitis.    °• GNP FIBER-CAPS 625 MG tablet Take 625 mg by mouth daily.   2  °• hyoscyamine (LEVBID) 0.375 MG 12 hr tablet TAKE (1) TABLET BY MOUTH EVERY TWELVE HOURS. 60 tablet 2  °• ibuprofen (ADVIL) 800 MG tablet     °• levETIRAcetam (KEPPRA) 250 MG tablet Take 2 tablets by mouth 2 (two) times daily.     °• metFORMIN (GLUCOPHAGE) 500 MG tablet Take 500 mg by mouth 2 (two) times daily.    °• Ocrelizumab (OCREVUS IV) Inject into the vein. Every 6   6 months for MS     pravastatin (PRAVACHOL) 20 MG tablet Take 20 mg by mouth every morning.      pregabalin (LYRICA) 75 MG capsule 1 in the evening and 2 at bedtime     VESICARE 10 MG tablet Take 1 tablet by mouth every morning.      No current facility-administered medications for this visit.    Allergies as of 07/10/2020 - Review Complete 07/10/2020  Allergen Reaction Noted   Celebrex [celecoxib] Nausea And Vomiting 03/04/2013   Vioxx [rofecoxib] Nausea And Vomiting 03/04/2013   Zanaflex [tizanidine hcl] Nausea And Vomiting 11/30/2014   Clindamycin Diarrhea 09/14/2012    Family History  Problem Relation Age of Onset   Heart attack Mother    Diabetes Father    Hypertension Father    Colon cancer Neg Hx     Social History   Socioeconomic History   Marital status: Married    Spouse name: Not on file   Number of children: Not on file   Years of education: Not on file   Highest education level: Not on file  Occupational History   Not on file  Tobacco Use   Smoking status: Never Smoker   Smokeless tobacco:  Never Used  Substance and Sexual Activity   Alcohol use: No    Comment: occasionally   Drug use: No   Sexual activity: Not on file  Other Topics Concern   Not on file  Social History Narrative   Not on file   Social Determinants of Health   Financial Resource Strain:    Difficulty of Paying Living Expenses: Not on file  Food Insecurity:    Worried About Running Out of Food in the Last Year: Not on file   [employer] of Food in the Last Year: Not on file  Transportation Needs:    Lack of Transportation (Medical): Not on file   Lack of Transportation (Non-Medical): Not on file  Physical Activity:    Days of Exercise per Week: Not on file   Minutes of Exercise per Session: Not on file  Stress:    Feeling of Stress : Not on file  Social Connections:    Frequency of Communication with Friends and Family: Not on file   Frequency of Social Gatherings with Friends and Family: Not on file   Attends Religious Services: Not on file   Active Member of Clubs or Organizations: Not on file   Attends Archivist Meetings: Not on file   Marital Status: Not on file    Review of Systems: Review of Systems  Constitutional: Negative for chills, fever, malaise/fatigue and weight loss.  HENT: Negative for congestion and sore throat.   Respiratory: Negative for cough and shortness of breath.   Cardiovascular: Negative for chest pain and palpitations.  Gastrointestinal: Negative for abdominal pain, blood in stool, constipation, diarrhea, heartburn, melena, nausea and vomiting.  Musculoskeletal: Negative for joint pain and myalgias.  Skin: Negative for rash.  Neurological: Negative for dizziness and weakness.  Endo/Heme/Allergies: Does not bruise/bleed easily.  Psychiatric/Behavioral: Negative for depression. The patient is not nervous/anxious.   All other systems reviewed and are negative.   Physical Exam: Note: limited exam due to virtual visit There were no vitals  taken for this visit. Physical Exam Nursing note reviewed.  Constitutional:      General: He is not in acute distress.    Appearance: Normal appearance. He is not ill-appearing, toxic-appearing or diaphoretic.  HENT:  Head: Normocephalic and atraumatic.  °   Nose: No congestion or rhinorrhea.  °Eyes:  °   General: No scleral icterus. °Pulmonary:  °   Effort: Pulmonary effort is normal.  °Abdominal:  °   General: There is no distension.  °   Palpations: There is no hepatomegaly or splenomegaly.  °Skin: °   Coloration: Skin is not jaundiced.  °   Findings: No bruising.  °Neurological:  °   General: No focal deficit present.  °   Mental Status: He is alert and oriented to person, place, and time. Mental status is at baseline.  °Psychiatric:     °   Mood and Affect: Mood normal.     °   Behavior: Behavior normal.     °   Thought Content: Thought content normal.  ° ° ° ° ° °Assessment: °Very pleasant 48-year-old male who follows up on elevated LFTs, likely medication effect, and abdominal pain/colon spasms.  Overall he is doing quite well.  His current main limitation is recent falls with torn cartilage in his knee and a strain of his ankle.  He is also been diagnosed with diabetes and has had to start wearing a brace to help prevent further falls.  Likely MS playing a role in this as well. ° °Elevated LFTs: His LFTs are intermittently mildly elevated.  We have not checked them in about a year or 2.  However, his neurologist who manages the MS has been trending up quite nicely.  No concerning elevations at this time.  Generally very mild.  Bilirubin remains normal.  Likely some medication effect. ° °Abdominal pain/call spasms: Historically he has done quite well on hyoscyamine for this.  He is going to be due for a refill soon and so I will send this to his pharmacy.  He denies any overt/ongoing abdominal pain, diarrhea.  No constipation on Levbid.  I have asked him to notify us of any worsening side effects  from the medication or if it fails to continue working. ° ° ° °Plan:  °1. Refill of hyoscyamine for 1 year supply °2. Call if any problems °3. Notify us of any worsening symptoms °4. Continue to follow with neurology for MS °5. They can reach out to us if assistance is needed with transaminitis °6. Follow-up as needed ° ° ° ° °Thank you for allowing us to participate in the care of Melissa K Potter ° °Eric Gill, DNP, AGNP-C °Adult & Gerontological Nurse Practitioner °[facility] ° °

## 2020-07-10 NOTE — Patient Instructions (Addendum)
Your health issues we discussed today were:   Elevated liver enzymes: 1. As we discussed, this is likely due to your medication 2. For last several liver tests/labs showed occasionally very mild elevation, other times normal 3. Neurologist can continue to monitor this 4. Your neurologist can reach out to [place] if you need any further input or assistance with your elevated liver enzymes 5. Call us if you have any worsening or severe symptoms  Abdominal pain: 1. I have sent a refill for your hyoscyamine (lipid) that you have been taking twice a day 2. I will send in a 30-day supply with 11 refills to last 2 years 3. Call us if you have any problems such as constipation.  Let us know if it does not work as well anymore  Overall I recommend:  1. Continue your other current medications 2. Return for follow-up as needed 3. Call us for any questions or concerns   ---------------------------------------------------------------  I am glad you have gotten your COVID-19 vaccination!  Even though you are fully vaccinated you should continue to follow CDC and state/local guidelines.  ---------------------------------------------------------------   At [facility] we value your feedback. You may receive a survey about your visit today. Please share your experience as we strive to create trusting relationships with our patients to provide genuine, compassionate, quality care.  We appreciate your understanding and patience as we review any laboratory studies, imaging, and other diagnostic tests that are ordered as we care for you. Our office policy is 5 business days for review of these results, and any emergent or urgent results are addressed in a timely manner for your best interest. If you do not hear from our office in 1 week, please contact us.   We also encourage the use of MyChart, which contains your medical information for your review as well. If you are not enrolled in this  feature, an access code is on this after visit summary for your convenience. Thank you for allowing [place] to be involved in your care.  It was great to see you today!  I hope you have a great Fall yes Document Loreal but I now he is works usually when she done on his either lung Months-still in the 44s!!

## 2020-07-10 NOTE — Telephone Encounter (Signed)
Heloise Purpura, you are scheduled for a virtual visit with your provider today.  Just as we do with appointments in the office, we must obtain your consent to participate.  Your consent will be active for this visit and any virtual visit you may have with one of our providers in the next 365 days.  If you have a MyChart account, I can also send a copy of this consent to you electronically.  All virtual visits are billed to your insurance company just like a traditional visit in the office.  As this is a virtual visit, video technology does not allow for your provider to perform a traditional examination.  This may limit your provider's ability to fully assess your condition.  If your provider identifies any concerns that need to be evaluated in person or the need to arrange testing such as labs, EKG, etc, we will make arrangements to do so.  Although advances in technology are sophisticated, we cannot ensure that it will always work on either your end or our end.  If the connection with a video visit is poor, we may have to switch to a telephone visit.  With either a video or telephone visit, we are not always able to ensure that we have a secure connection.   I need to obtain your verbal consent now.   Are you willing to proceed with your visit today?

## 2020-07-10 NOTE — Telephone Encounter (Signed)
Pt consented to a virtual visit today.   

## 2020-08-05 ENCOUNTER — Ambulatory Visit
Admission: EM | Admit: 2020-08-05 | Discharge: 2020-08-05 | Disposition: A | Discharge status: Home / Self Care | Payer: Medicare HMO | Attending: Emergency Medicine | Admitting: Emergency Medicine

## 2020-08-05 ENCOUNTER — Other Ambulatory Visit: Payer: Self-pay

## 2020-08-05 DIAGNOSIS — Z79899 Other long term (current) drug therapy: Secondary | ICD-10-CM | POA: Insufficient documentation

## 2020-08-05 DIAGNOSIS — G35 Multiple sclerosis: Secondary | ICD-10-CM | POA: Diagnosis not present

## 2020-08-05 DIAGNOSIS — L750 Bromhidrosis: Secondary | ICD-10-CM | POA: Insufficient documentation

## 2020-08-05 NOTE — ED Provider Notes (Signed)
[facility]   CC: groin odor  SUBJECTIVE:  Vincent Anthony is a 49 y.o. male who complains of groin odor x 3 weeks.  Patient denies a precipitating event.  Hx significant for MS, is mostly wheelchair bound.  Localizes the smell towards groin.  Wife and patient notice smell during clothing changes.  Has tried OTC neosporin without relief.  Symptoms are made worse with changing clothes.  Denies similar symptoms in the past.  Denies fever, chills, nausea, vomiting, abdominal pain, testicular pain, swelling, changes in bowel or bladder habits, dysuria, urinary urgency, urinary frequency.    LMP: No LMP for male patient.  ROS: As in HPI.  All other pertinent ROS negative.     Past Medical History:  Diagnosis Date  . Arthritis   . Elevated LFTs   . Elevated liver enzymes   . GERD (gastroesophageal reflux disease)   . Headache   . MS (multiple sclerosis) (HCC)   . Tremor    Past Surgical History:  Procedure Laterality Date  . COLONOSCOPY     2-4 years ago  . HERNIA REPAIR    . KNEE SURGERY    . TESTICLE REMOVAL     Allergies  Allergen Reactions  . Celebrex [Celecoxib] Nausea And Vomiting  . Vioxx [Rofecoxib] Nausea And Vomiting  . Zanaflex [Tizanidine Hcl] Nausea And Vomiting  . Clindamycin Diarrhea   No current facility-administered medications on file prior to encounter.   Current Outpatient Medications on File Prior to Encounter  Medication Sig Dispense Refill  . amitriptyline (ELAVIL) 10 MG tablet Take 1 tablet by mouth at bedtime.     Marland Kitchen amphetamine-dextroamphetamine (ADDERALL) 20 MG tablet Take 20 mg by mouth daily as needed.  0  . baclofen (LIORESAL) 10 MG tablet Take 1 tablet by mouth 3 (three) times daily.    . cetirizine (ZYRTEC) 10 MG tablet Take 10 mg by mouth daily.    . Cholecalciferol (VITAMIN D3) 50 MCG (2000 UT) TABS Take 2,000 Units by mouth in the morning.    . clonazePAM (KLONOPIN) 1 MG tablet Take 1 tablet by mouth 2 (two) times daily. Or as  needed    . Coenzyme Q10 (CO Q 10) 100 MG CAPS Take 1 tablet by mouth every morning.    . fluticasone (FLONASE) 50 MCG/ACT nasal spray Place 2 sprays into both nostrils daily as needed for allergies or rhinitis.    Marland Kitchen GNP FIBER-CAPS 625 MG tablet Take 625 mg by mouth daily.   2  . hyoscyamine (LEVBID) 0.375 MG 12 hr tablet TAKE (1) TABLET BY MOUTH EVERY TWELVE HOURS. 60 tablet 2  . ibuprofen (ADVIL) 800 MG tablet     . levETIRAcetam (KEPPRA) 250 MG tablet Take 2 tablets by mouth 2 (two) times daily.     . metFORMIN (GLUCOPHAGE) 500 MG tablet Take 500 mg by mouth 2 (two) times daily.    [employer] (OCREVUS IV) Inject into the vein. Every 6 months for MS    . pravastatin (PRAVACHOL) 20 MG tablet Take 20 mg by mouth every morning.     . pregabalin (LYRICA) 75 MG capsule 1 in the evening and 2 at bedtime    . VESICARE 10 MG tablet Take 1 tablet by mouth every morning.      Social History   Socioeconomic History  . Marital status: Married    Spouse name: Not on file  . Number of children: Not on file  . Years of education: Not on file  .  Highest education level: Not on file  Occupational History  . Not on file  Tobacco Use  . Smoking status: Never Smoker  . Smokeless tobacco: Never Used  Substance and Sexual Activity  . Alcohol use: No    Comment: occasionally  . Drug use: No  . Sexual activity: Not on file  Other Topics Concern  . Not on file  Social History Narrative  . Not on file   Social Determinants of Health   Financial Resource Strain:   . Difficulty of Paying Living Expenses: Not on file  Food Insecurity:   . Worried About Programme researcher, broadcasting/film/video in the Last Year: Not on file  . Ran Out of Food in the Last Year: Not on file  Transportation Needs:   . Lack of Transportation (Medical): Not on file  . Lack of Transportation (Non-Medical): Not on file  Physical Activity:   . Days of Exercise per Week: Not on file  . Minutes of Exercise per Session: Not on file  Stress:    . Feeling of Stress : Not on file  Social Connections:   . Frequency of Communication with Friends and Family: Not on file  . Frequency of Social Gatherings with Friends and Family: Not on file  . Attends Religious Services: Not on file  . Active Member of Clubs or Organizations: Not on file  . Attends Banker Meetings: Not on file  . Marital Status: Not on file  Intimate Partner Violence:   . Fear of Current or Ex-Partner: Not on file  . Emotionally Abused: Not on file  . Physically Abused: Not on file  . Sexually Abused: Not on file   Family History  Problem Relation Age of Onset  . Heart attack Mother   . Diabetes Father   . Hypertension Father   . Colon cancer Neg Hx     OBJECTIVE:  Vitals:   08/05/20 1733  BP: 134/73  Pulse: 77  Resp: 17  Temp: 98.8 F (37.1 C)  TempSrc: Oral  SpO2: 98%   General appearance: Alert in no acute distress HEENT: NCAT.  Oropharynx clear.  Lungs: clear to auscultation bilaterally without adventitious breath sounds Heart: regular rate and rhythm.   Abdomen: soft; non-distended; no tenderness; bowel sounds present; no guarding or rebound tenderness GU: chaperone Emelda Brothers RT present.  Circumcised male; no inguinal LAD; no obvious rashes or lesions; no penile discharge; testicular prosethetic present in LT testicle, testicles nontender to palpation, negative phren's sign; no obvious swelling; testicles appear symmetrical in size; perineum and groin without sores or abscess, no abscess or sores seen around rectum or buttock, no erythema, drainage or odor present during examination Extremities: no edema; symmetrical with no gross deformities Skin: warm and dry Neurologic: Sitting in wheelchair Psychological: alert and cooperative; normal mood and affect  Labs Reviewed  URINE CULTURE  POCT URINALYSIS DIP (MANUAL ENTRY)    ASSESSMENT & PLAN:  1. Abnormal body odor    Exam and vital signs within normal limits Unable to  give urine sample in office Will collect urine sample at home and return to office Return here or go to ER if you have any new or worsening symptoms such as fever, worsening abdominal pain, nausea/vomiting, flank pain, etc...  Outlined signs and symptoms indicating need for more acute intervention. Patient verbalized understanding. After Visit Summary given.     Rennis Harding, PA-C 08/05/20 1831

## 2020-08-05 NOTE — ED Notes (Signed)
Pt unable to provide urine sample. Given urine cup to take home

## 2020-08-05 NOTE — ED Triage Notes (Signed)
Triaged by provider  

## 2020-08-05 NOTE — Discharge Instructions (Addendum)
Exam and vital signs within normal limits Unable to give urine sample in office Will collect urine sample at home and return to office Return here or go to ER if you have any new or worsening symptoms such as fever, worsening abdominal pain, nausea/vomiting, flank pain, etc..Marland Kitchen

## 2020-08-06 ENCOUNTER — Telehealth: Payer: Self-pay | Admitting: Emergency Medicine

## 2020-08-06 ENCOUNTER — Telehealth (INDEPENDENT_AMBULATORY_CARE_PROVIDER_SITE_OTHER): Payer: Medicare HMO | Admitting: Emergency Medicine

## 2020-08-06 DIAGNOSIS — L750 Bromhidrosis: Secondary | ICD-10-CM | POA: Diagnosis not present

## 2020-08-06 LAB — POCT URINALYSIS DIPSTICK
Bilirubin, UA: NEGATIVE
Glucose, UA: NEGATIVE
Ketones, UA: NEGATIVE
Leukocytes, UA: NEGATIVE
Nitrite, UA: POSITIVE
Protein, UA: NEGATIVE
Spec Grav, UA: >=1.03 — AB (ref 1.010–1.025)
Urobilinogen, UA: 1 E.U./dL
pH, UA: 5.5 (ref 5.0–8.0)

## 2020-08-06 MED ORDER — NITROFURANTOIN MONOHYD MACRO 100 MG PO CAPS: 100.0000 mg | ORAL_CAPSULE | Freq: Two times a day (BID) | ORAL | 0 refills | Status: DC

## 2020-08-06 NOTE — Telephone Encounter (Signed)
Urine concerning for infection.  Macrobid sent to [facility] [address] in [place].  Called patient and informed him of his results and treatment

## 2020-08-06 NOTE — Telephone Encounter (Signed)
Patient dropped off urine following visit on 08/05/20 for abnormal body odor

## 2020-08-09 LAB — URINE CULTURE: Culture: >=100000 — AB

## 2020-09-13 ENCOUNTER — Other Ambulatory Visit: Payer: Self-pay | Admitting: Nurse Practitioner

## 2020-09-13 DIAGNOSIS — R7989 Other specified abnormal findings of blood chemistry: Secondary | ICD-10-CM

## 2020-09-13 DIAGNOSIS — R1084 Generalized abdominal pain: Secondary | ICD-10-CM

## 2020-10-29 ENCOUNTER — Encounter (HOSPITAL_COMMUNITY): Payer: Self-pay | Admitting: Emergency Medicine

## 2020-10-29 ENCOUNTER — Other Ambulatory Visit: Payer: Self-pay

## 2020-10-29 ENCOUNTER — Emergency Department (HOSPITAL_COMMUNITY)
Admission: EM | Admit: 2020-10-29 | Discharge: 2020-10-31 | Disposition: A | Discharge status: Home / Self Care | Payer: Medicare HMO | Attending: Emergency Medicine | Admitting: Emergency Medicine

## 2020-10-29 ENCOUNTER — Emergency Department (HOSPITAL_COMMUNITY): Payer: Medicare HMO

## 2020-10-29 DIAGNOSIS — M6281 Muscle weakness (generalized): Secondary | ICD-10-CM | POA: Insufficient documentation

## 2020-10-29 DIAGNOSIS — W19XXXA Unspecified fall, initial encounter: Secondary | ICD-10-CM | POA: Diagnosis not present

## 2020-10-29 DIAGNOSIS — M4802 Spinal stenosis, cervical region: Secondary | ICD-10-CM | POA: Insufficient documentation

## 2020-10-29 DIAGNOSIS — G35 Multiple sclerosis: Secondary | ICD-10-CM | POA: Diagnosis not present

## 2020-10-29 DIAGNOSIS — R2689 Other abnormalities of gait and mobility: Secondary | ICD-10-CM | POA: Diagnosis not present

## 2020-10-29 DIAGNOSIS — R2681 Unsteadiness on feet: Secondary | ICD-10-CM | POA: Diagnosis not present

## 2020-10-29 DIAGNOSIS — Z20822 Contact with and (suspected) exposure to covid-19: Secondary | ICD-10-CM | POA: Insufficient documentation

## 2020-10-29 DIAGNOSIS — M47812 Spondylosis without myelopathy or radiculopathy, cervical region: Secondary | ICD-10-CM | POA: Insufficient documentation

## 2020-10-29 DIAGNOSIS — R531 Weakness: Secondary | ICD-10-CM | POA: Diagnosis present

## 2020-10-29 DIAGNOSIS — Y92002 Bathroom of unspecified non-institutional (private) residence single-family (private) house as the place of occurrence of the external cause: Secondary | ICD-10-CM | POA: Diagnosis not present

## 2020-10-29 DIAGNOSIS — K5641 Fecal impaction: Secondary | ICD-10-CM | POA: Diagnosis not present

## 2020-10-29 LAB — CBC WITH DIFFERENTIAL/PLATELET
Abs Immature Granulocytes: 0.03 10*3/uL (ref 0.00–0.07)
Basophils Absolute: 0 10*3/uL (ref 0.0–0.1)
Basophils Relative: 0 %
Eosinophils Absolute: 0 10*3/uL (ref 0.0–0.5)
Eosinophils Relative: 0 %
HCT: 45.3 % (ref 39.0–52.0)
Hemoglobin: 14.4 g/dL (ref 13.0–17.0)
Immature Granulocytes: 0 %
Lymphocytes Relative: 4 %
Lymphs Abs: 0.4 10*3/uL — ABNORMAL LOW (ref 0.7–4.0)
MCH: 27.2 pg (ref 26.0–34.0)
MCHC: 31.8 g/dL (ref 30.0–36.0)
MCV: 85.5 fL (ref 80.0–100.0)
Monocytes Absolute: 0.5 10*3/uL (ref 0.1–1.0)
Monocytes Relative: 5 %
Neutro Abs: 9.3 10*3/uL — ABNORMAL HIGH (ref 1.7–7.7)
Neutrophils Relative %: 91 %
Platelets: 268 10*3/uL (ref 150–400)
RBC: 5.3 MIL/uL (ref 4.22–5.81)
RDW: 14.4 % (ref 11.5–15.5)
WBC: 10.2 10*3/uL (ref 4.0–10.5)
nRBC: 0 % (ref 0.0–0.2)

## 2020-10-29 LAB — URINALYSIS, ROUTINE W REFLEX MICROSCOPIC
Bilirubin Urine: NEGATIVE
Glucose, UA: NEGATIVE mg/dL
Hgb urine dipstick: NEGATIVE
Ketones, ur: NEGATIVE mg/dL
Leukocytes,Ua: NEGATIVE
Nitrite: NEGATIVE
Protein, ur: NEGATIVE mg/dL
Specific Gravity, Urine: 1.013 (ref 1.005–1.030)
pH: 6 (ref 5.0–8.0)

## 2020-10-29 LAB — BASIC METABOLIC PANEL
Anion gap: 11 (ref 5–15)
BUN: 19 mg/dL (ref 6–20)
CO2: 26 mmol/L (ref 22–32)
Calcium: 10.2 mg/dL (ref 8.9–10.3)
Chloride: 101 mmol/L (ref 98–111)
Creatinine, Ser: 1.16 mg/dL (ref 0.61–1.24)
GFR, Estimated: >60 mL/min (ref >60)
Glucose, Bld: 98 mg/dL (ref 70–99)
Potassium: 4.3 mmol/L (ref 3.5–5.1)
Sodium: 138 mmol/L (ref 135–145)

## 2020-10-29 IMAGING — MR MR CERVICAL SPINE WO/W CM
4 of 8 series · 18 of 48 positions shown · IV contrast (gadavist)
Comparison: None.

EXAM:
MRI HEAD WITHOUT AND WITH CONTRAST

MRI CERVICAL SPINE WITHOUT AND WITH CONTRAST
MRA HEAD WITHOUT CONTRAST
TECHNIQUE: Multiplanar, multiecho pulse sequences of the brain and surrounding
structures, and cervical spine, to include the craniocervical
junction and cervicothoracic junction, were obtained without and
with intravenous contrast. Additional angiographic images of the
intracranial circulation and circle-of-Willis were performed without
the administration of IV contrast.
CONTRAST:  10mL GADAVIST GADOBUTROL 1 MMOL/ML IV SOLN

[Series 7: STIR · sagittal · 3.0mm · 0.86mm/px · 4 of 15 slices shown]
[im 1/15]
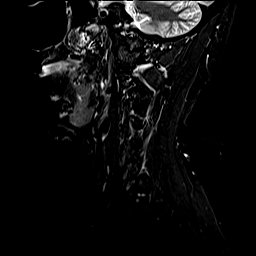
[im 5/15]
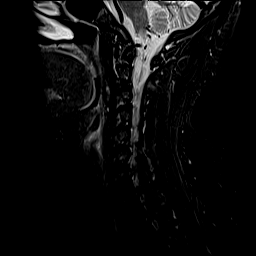
[im 10/15]
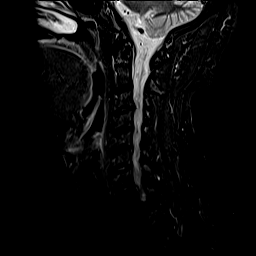
[im 15/15]
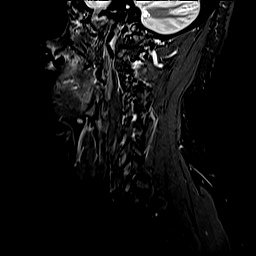

[Series 10: T1 · axial · non-contrast · 3.0mm · 0.35mm/px · z∈[-176,-78]mm · 8 of 32 slices shown]
[im 1/32]
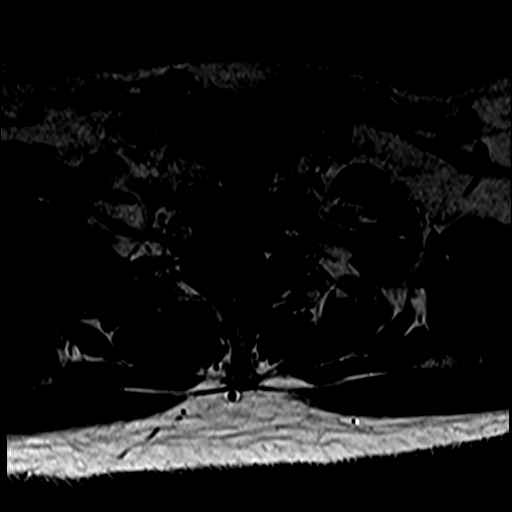
[im 5/32]
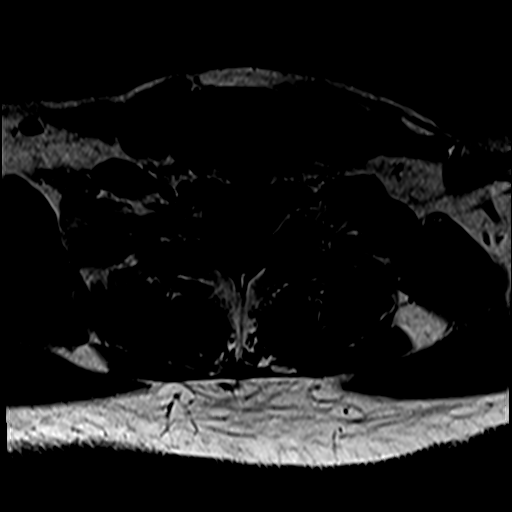
[im 9/32]
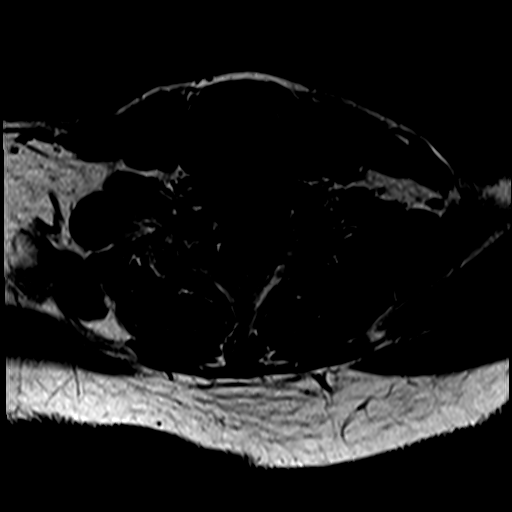
[im 14/32]
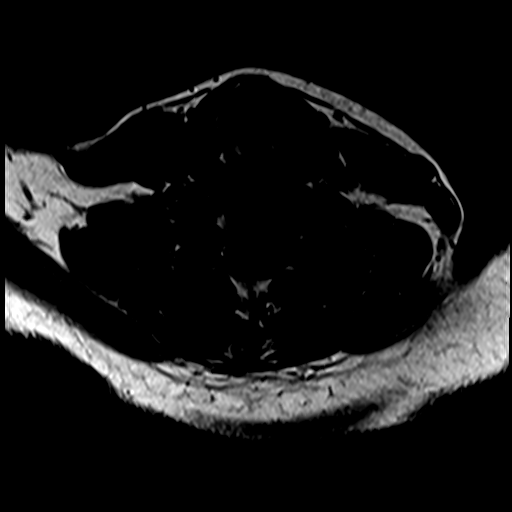
[im 18/32]
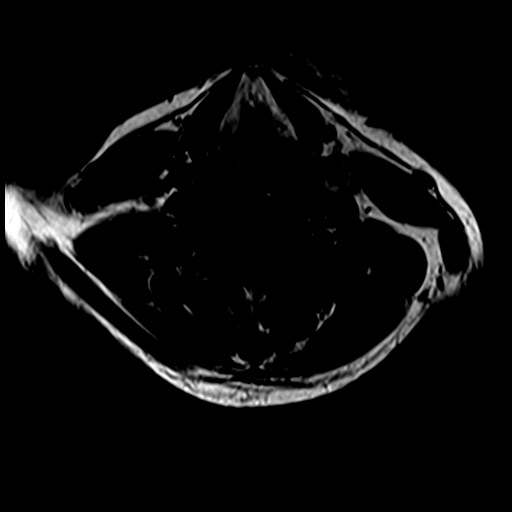
[im 23/32]
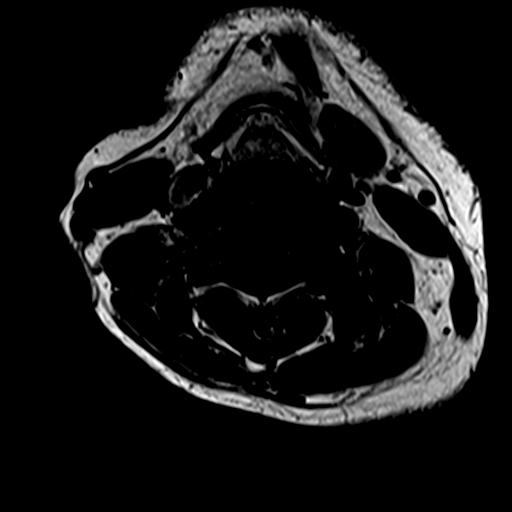
[im 27/32]
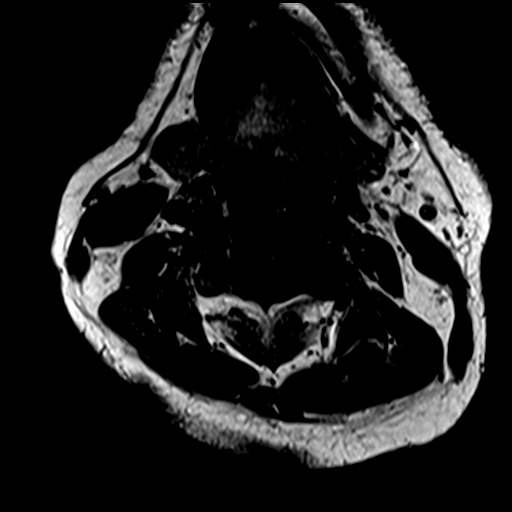
[im 32/32]
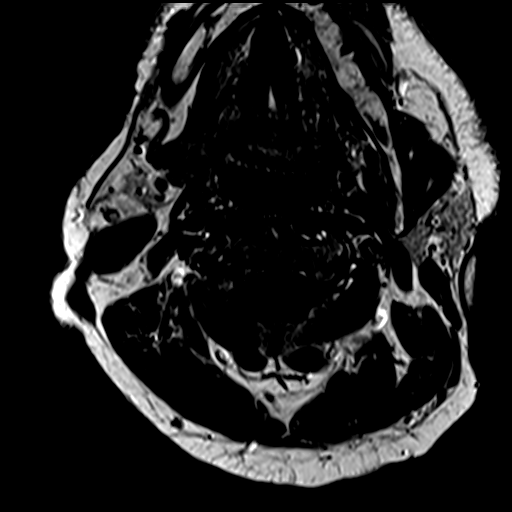

[Series 11: T1 post-contrast · sagittal · 3.0mm · 0.43mm/px · 3 of 15 slices shown (1 of 2)]
[im 1/15]
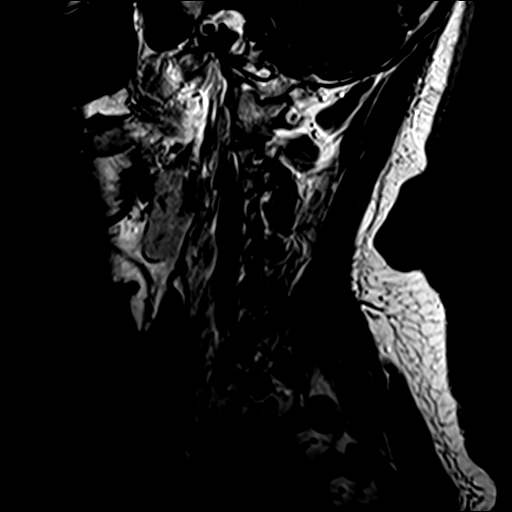
[im 10/15]
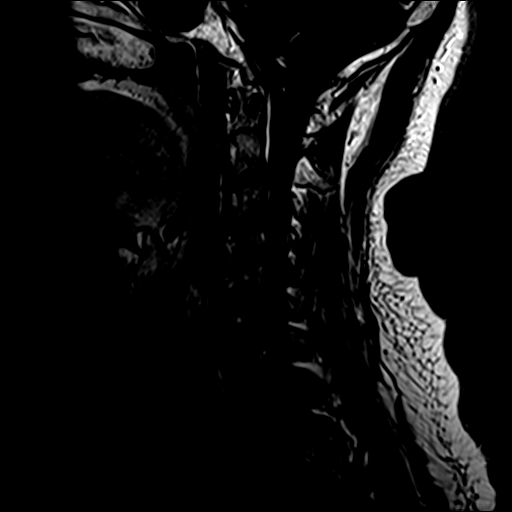
[im 15/15]
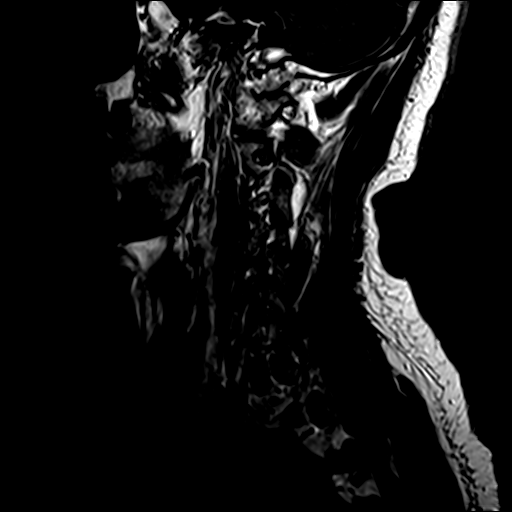

[Series 12: T1 post-contrast · axial · 3.0mm · 0.35mm/px · z∈[-163,-94]mm · 3 of 32 slices shown (2 of 2)]
[im 5/32]
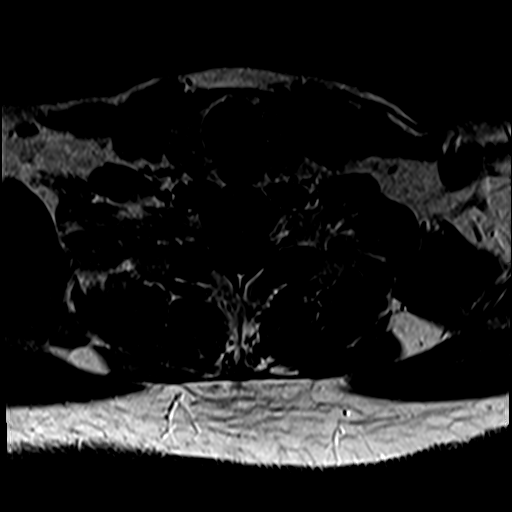
[im 18/32]
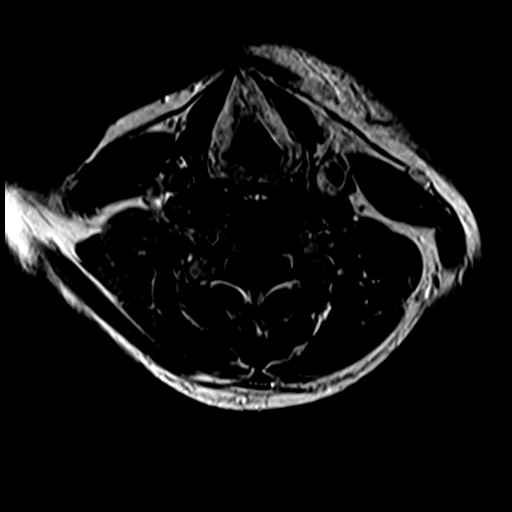
[im 27/32]
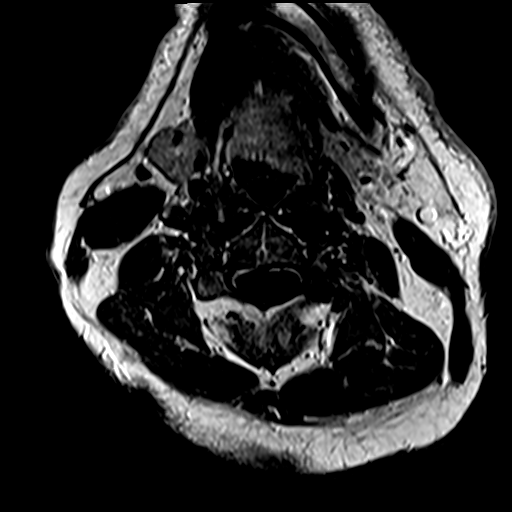

[18 of 48 positions shown; findings below may reference images not displayed]

FINDINGS: MRI HEAD FINDINGS

Brain: Cerebral volume within normal limits for age. Patchy
multifocal foci of T2/FLAIR hyperintensity seen involving the
periventricular, deep, in juxta cortical white matter of both
cerebral hemispheres. Several of these foci radiated in a
perpendicular fashion from the lateral ventricles. Callososeptal
involvement. Patchy infratentorial involvement noted about the
fourth ventricle and right pons. Multiple scattered corresponding T1
black holes. Findings consistent with provided history of multiple
sclerosis. No associated restricted diffusion or enhancement to
suggest active demyelination.

No evidence for acute or subacute infarct. Gray-white matter
differentiation otherwise maintained. No encephalomalacia to suggest
chronic cortical infarction. No evidence for acute or chronic
intracranial hemorrhage.

No mass lesion, midline shift or mass effect. No hydrocephalus or
extra-axial fluid collection. Pituitary gland suprasellar region
within normal limits. Midline structures intact. No other abnormal
enhancement.

Vascular: Major intracranial vascular flow voids are maintained.

Skull and upper cervical spine: Craniocervical junction within
normal limits. Bone marrow signal intensity normal. No scalp soft
tissue abnormality.

Sinuses/Orbits: Globes and orbital soft tissues within normal
limits. Right maxillary sinus retention cyst noted. Paranasal
sinuses are otherwise clear. No mastoid effusion. Inner ear
structures grossly normal.

Other: None.

MRA HEAD FINDINGS

ANTERIOR CIRCULATION:

Examination mildly degraded by motion artifact.

Visualized distal cervical segments of the internal carotid arteries
are patent with antegrade flow. Petrous, cavernous, and supraclinoid
segments patent without stenosis or other abnormality. A1 segments
widely patent. Normal anterior communicating artery complex.
Anterior cerebral arteries patent to their distal aspects without
stenosis. No M1 stenosis or occlusion. Normal MCA bifurcations.
Distal MCA branches well perfused and symmetric.

POSTERIOR CIRCULATION:

Both V4 segments patent to the vertebrobasilar junction without
stenosis. Right vertebral dominant. Partially visualized left PICA
patent. Right PICA not seen. Basilar patent to its distal aspect
without stenosis. Superior cerebellar arteries patent bilaterally.
Both PCA supplied via the basilar as well as prominent bilateral
posterior communicating arteries. PCAs grossly perfused to their
distal aspects without appreciable stenosis.

No intracranial aneurysm.

MRI CERVICAL SPINE FINDINGS

Alignment: Straightening of the normal cervical lordosis. No
listhesis.

Vertebrae: Vertebral body height maintained without acute or chronic
fracture. Bone marrow signal intensity somewhat diffusely
heterogeneous but within normal limits. No discrete or worrisome
osseous lesions. No abnormal marrow edema or enhancement.

Cord: Extensive patchy signal abnormality seen throughout the
cervical spinal cord, extending from C1-2 through C6-7, with
involvement of nearly all levels. Overall, involvement is slightly
greater within the right hemi cord as compared to the left.
Underlying cord atrophy. Finding consistent with multiple sclerosis.
No abnormal enhancement to suggest active demyelination.

Posterior Fossa, vertebral arteries, paraspinal tissues: Paraspinous
and prevertebral soft tissues within normal limits. Normal flow
voids seen within the vertebral arteries bilaterally.

Disc levels:

C2-C3: Small central disc protrusion indents the ventral thecal sac.
Associated annular fissure. No significant spinal stenosis. Mild
left greater than right uncovertebral hypertrophy without
significant foraminal encroachment.

C3-C4: Broad-based left paracentral disc osteophyte complex indents
the left ventral thecal sac (series 9, image 9). Resultant mild
spinal stenosis with mild flattening of the left hemi cord. Right
worse than left uncovertebral hypertrophy with resultant moderate
right C4 foraminal stenosis.

C4-C5: Mild disc bulge with uncovertebral hypertrophy. Superimposed
central disc protrusion indents the ventral thecal sac, contacting
and mildly flattening the ventral cord. Resultant mild spinal
stenosis. Right worse than left uncovertebral hypertrophy with
resultant mild to moderate right C5 foraminal stenosis. Left neural
foramina remains patent.

C5-C6: Diffuse disc bulge with bilateral uncovertebral hypertrophy.
Superimposed central disc protrusion indents the ventral thecal sac,
slightly asymmetric to the right (series 9, image 19). Mild
flattening of the right hemi cord with mild spinal stenosis. Mild
right C6 foraminal narrowing.

C6-C7: Mild disc bulge with right greater than left uncovertebral
hypertrophy. No significant spinal stenosis. Mild right C7 foraminal
narrowing.

C7-T1:  Unremarkable.
IMPRESSION: MRI HEAD IMPRESSION:

1. Patchy multifocal T2/FLAIR signal abnormality involving the
supratentorial and infratentorial white matter as above, consistent
with provided history of multiple sclerosis. No evidence for active
demyelination.
2. Otherwise unremarkable brain MRI for age. No other acute
intracranial abnormality.

MRA HEAD IMPRESSION:

Normal intracranial MRA. No large vessel occlusion or
hemodynamically significant stenosis.

MRI CERVICAL SPINE IMPRESSION:

1. Extensive patchy signal abnormality throughout the cervical
spinal cord, consistent with multiple sclerosis. No abnormal
enhancement to suggest active demyelination.
2. Multilevel cervical spondylosis with resultant mild diffuse
spinal stenosis at C3-4 through C5-6. Mild to moderate right C4
through C6 foraminal narrowing as above.

## 2020-10-29 IMAGING — MR MR HEAD WO/W CM
19 of 22 series · 26 of 48 positions shown · IV contrast (gadavist)
Comparison: None.

EXAM:
MRI HEAD WITHOUT AND WITH CONTRAST

MRI CERVICAL SPINE WITHOUT AND WITH CONTRAST
MRA HEAD WITHOUT CONTRAST
TECHNIQUE: Multiplanar, multiecho pulse sequences of the brain and surrounding
structures, and cervical spine, to include the craniocervical
junction and cervicothoracic junction, were obtained without and
with intravenous contrast. Additional angiographic images of the
intracranial circulation and circle-of-Willis were performed without
the administration of IV contrast.
CONTRAST:  10mL GADAVIST GADOBUTROL 1 MMOL/ML IV SOLN

[Series 5: DWI · axial · 4.0mm · 0.88mm/px · 1 of 36 slices shown (1 of 6)]
[im 1/36]
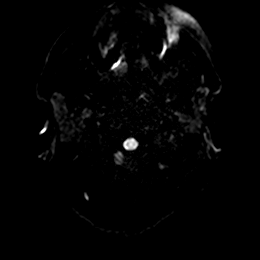

[Series 5: DWI · axial · 4.0mm · 0.88mm/px · 1 of 36 slices shown (2 of 6)]
[im 1/36]
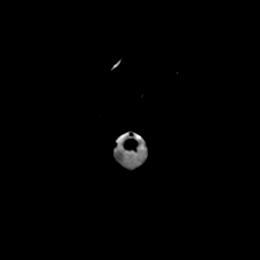

[Series 6: DWI · axial · 4.0mm · 0.88mm/px · 1 of 36 slices shown (3 of 6)]
[im 1/36]
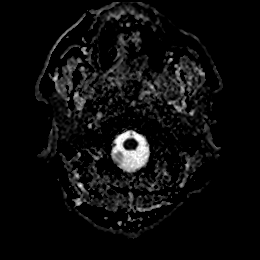

[Series 7: DWI · coronal · 5.0mm · 0.88mm/px · 1 of 28 slices shown (4 of 6)]
[im 1/28]
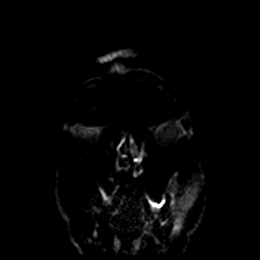

[Series 7: DWI · coronal · 5.0mm · 0.88mm/px · 1 of 28 slices shown (5 of 6)]
[im 1/28]
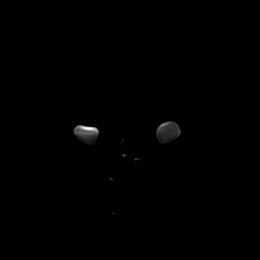

[Series 8: DWI · coronal · 5.0mm · 0.88mm/px · 2 of 28 slices shown (6 of 6)]
[im 1/28]
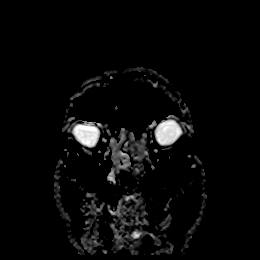
[im 28/28]
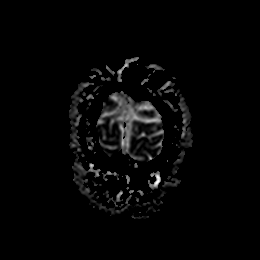

[Series 9: T1 · sagittal · 5.0mm · 0.94mm/px · 2 of 25 slices shown]
[im 1/25]
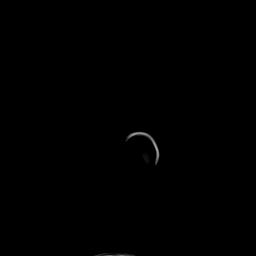
[im 25/25]
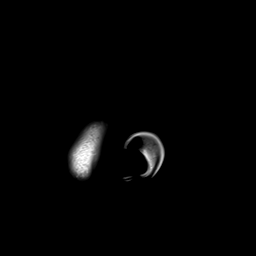

[Series 10: T2 · axial · 5.0mm · 0.72mm/px · 1 of 20 slices shown (1 of 2)]
[im 1/20]
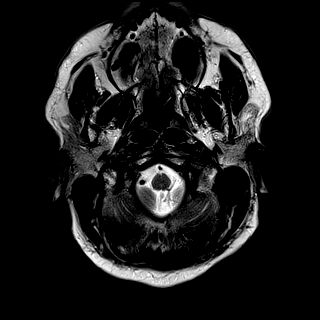

[Series 16: ax hemo · axial · 5.0mm · 0.86mm/px · z∈[-41,+97]mm · 2 of 25 slices shown]
[im 1/25]
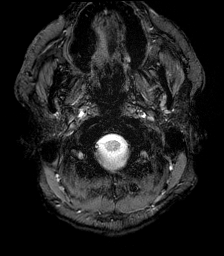
[im 25/25]
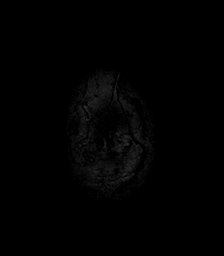

[Series 17: FLAIR · axial · 4.0mm · 0.43mm/px · z∈[-31,+88]mm · 2 of 32 slices shown (1 of 2)]
[im 1/32]
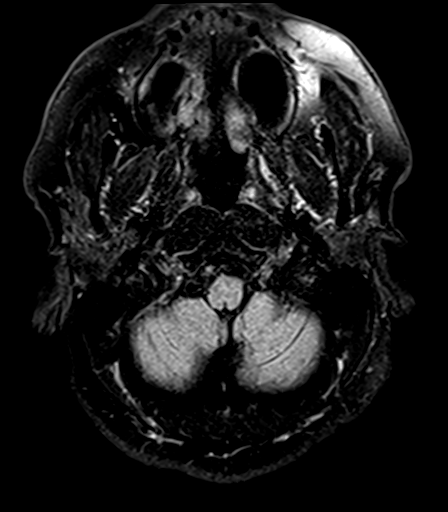
[im 32/32]
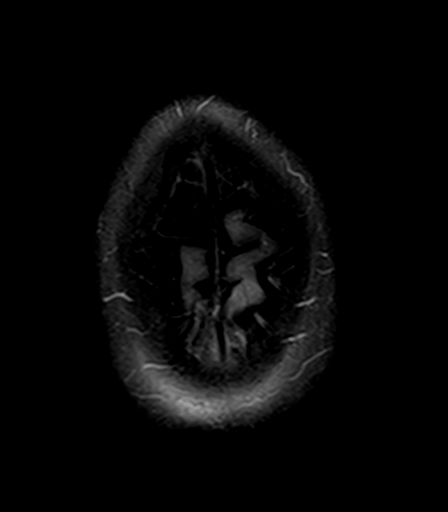

[Series 19: FLAIR · sagittal · 5.0mm · 0.94mm/px · 2 of 25 slices shown (2 of 2)]
[im 1/25]
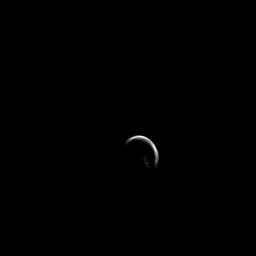
[im 25/25]
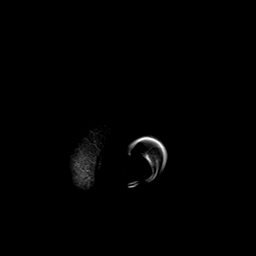

[Series 20: T2 · coronal · 5.0mm · 0.72mm/px · 2 of 28 slices shown (2 of 2)]
[im 1/28]
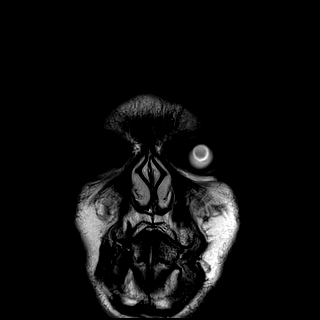
[im 28/28]
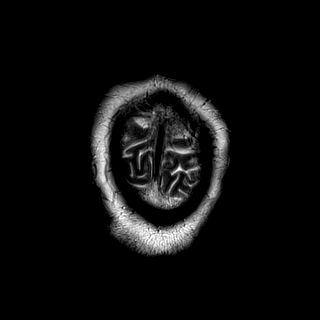

[Series 22: T1 post-contrast · coronal · 5.0mm · 0.34mm/px · 2 of 29 slices shown]
[im 1/29]
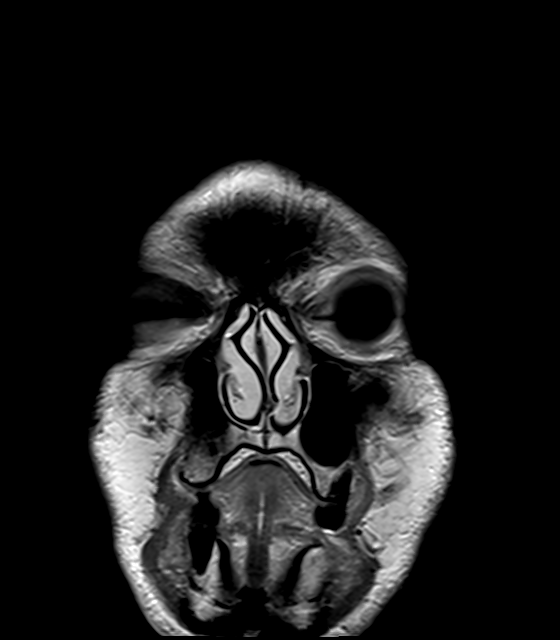
[im 29/29]
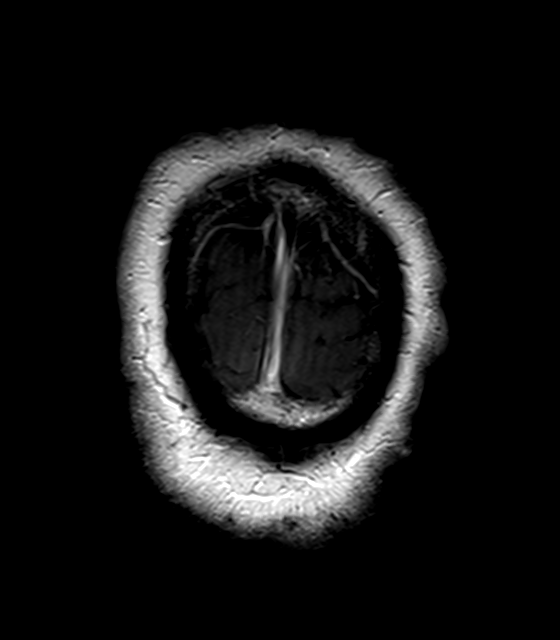

[Series 1022: tumble · 0.34mm/px · 1 of 7 slices shown (1 of 3)]
[im 1/7]
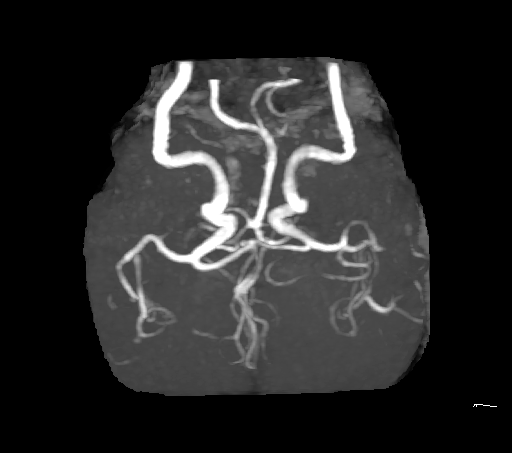

[Series 1026: rotate · 0.34mm/px · 1 of 8 slices shown (1 of 3)]
[im 1/8]
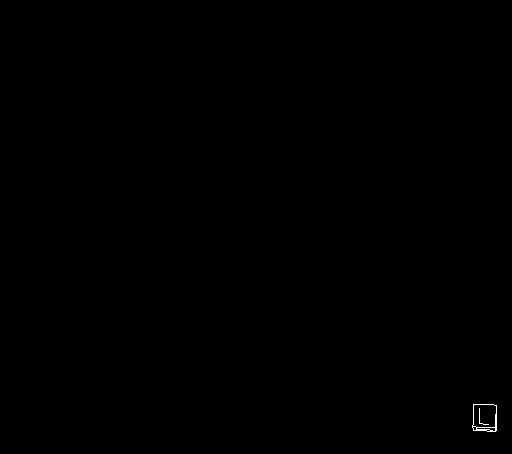

[Series 1031: tumble · 0.34mm/px · 1 of 6 slices shown (2 of 3)]
[im 1/6]
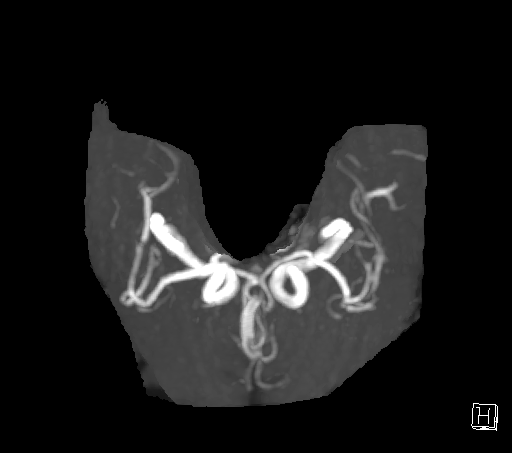

[Series 1035: rotate · 0.34mm/px · 1 of 8 slices shown (2 of 3)]
[im 1/8]
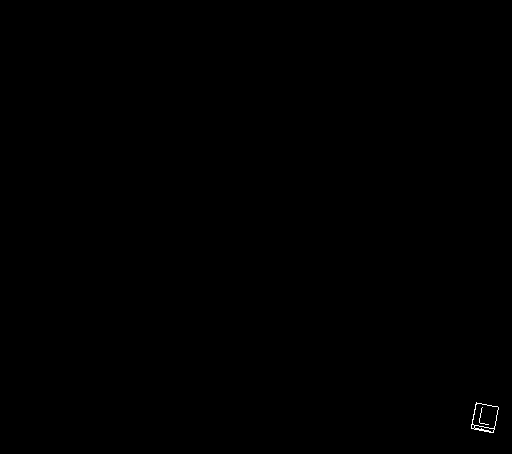

[Series 1040: tumble · 0.34mm/px · 1 of 19 slices shown (3 of 3)]
[im 1/19]
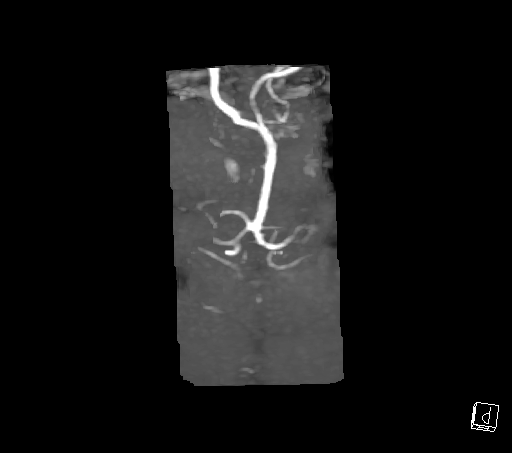

[Series 1044: rotate · 0.34mm/px · 1 of 19 slices shown (3 of 3)]
[im 1/19]
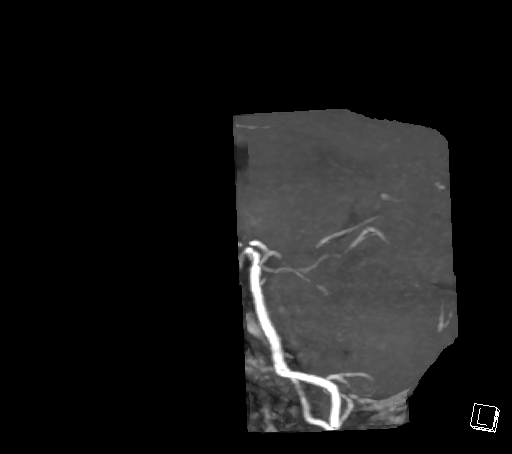

[26 of 48 positions shown; findings below may reference images not displayed]

FINDINGS: MRI HEAD FINDINGS

Brain: Cerebral volume within normal limits for age. Patchy
multifocal foci of T2/FLAIR hyperintensity seen involving the
periventricular, deep, in juxta cortical white matter of both
cerebral hemispheres. Several of these foci radiated in a
perpendicular fashion from the lateral ventricles. Callososeptal
involvement. Patchy infratentorial involvement noted about the
fourth ventricle and right pons. Multiple scattered corresponding T1
black holes. Findings consistent with provided history of multiple
sclerosis. No associated restricted diffusion or enhancement to
suggest active demyelination.

No evidence for acute or subacute infarct. Gray-white matter
differentiation otherwise maintained. No encephalomalacia to suggest
chronic cortical infarction. No evidence for acute or chronic
intracranial hemorrhage.

No mass lesion, midline shift or mass effect. No hydrocephalus or
extra-axial fluid collection. Pituitary gland suprasellar region
within normal limits. Midline structures intact. No other abnormal
enhancement.

Vascular: Major intracranial vascular flow voids are maintained.

Skull and upper cervical spine: Craniocervical junction within
normal limits. Bone marrow signal intensity normal. No scalp soft
tissue abnormality.

Sinuses/Orbits: Globes and orbital soft tissues within normal
limits. Right maxillary sinus retention cyst noted. Paranasal
sinuses are otherwise clear. No mastoid effusion. Inner ear
structures grossly normal.

Other: None.

MRA HEAD FINDINGS

ANTERIOR CIRCULATION:

Examination mildly degraded by motion artifact.

Visualized distal cervical segments of the internal carotid arteries
are patent with antegrade flow. Petrous, cavernous, and supraclinoid
segments patent without stenosis or other abnormality. A1 segments
widely patent. Normal anterior communicating artery complex.
Anterior cerebral arteries patent to their distal aspects without
stenosis. No M1 stenosis or occlusion. Normal MCA bifurcations.
Distal MCA branches well perfused and symmetric.

POSTERIOR CIRCULATION:

Both V4 segments patent to the vertebrobasilar junction without
stenosis. Right vertebral dominant. Partially visualized left PICA
patent. Right PICA not seen. Basilar patent to its distal aspect
without stenosis. Superior cerebellar arteries patent bilaterally.
Both PCA supplied via the basilar as well as prominent bilateral
posterior communicating arteries. PCAs grossly perfused to their
distal aspects without appreciable stenosis.

No intracranial aneurysm.

MRI CERVICAL SPINE FINDINGS

Alignment: Straightening of the normal cervical lordosis. No
listhesis.

Vertebrae: Vertebral body height maintained without acute or chronic
fracture. Bone marrow signal intensity somewhat diffusely
heterogeneous but within normal limits. No discrete or worrisome
osseous lesions. No abnormal marrow edema or enhancement.

Cord: Extensive patchy signal abnormality seen throughout the
cervical spinal cord, extending from C1-2 through C6-7, with
involvement of nearly all levels. Overall, involvement is slightly
greater within the right hemi cord as compared to the left.
Underlying cord atrophy. Finding consistent with multiple sclerosis.
No abnormal enhancement to suggest active demyelination.

Posterior Fossa, vertebral arteries, paraspinal tissues: Paraspinous
and prevertebral soft tissues within normal limits. Normal flow
voids seen within the vertebral arteries bilaterally.

Disc levels:

C2-C3: Small central disc protrusion indents the ventral thecal sac.
Associated annular fissure. No significant spinal stenosis. Mild
left greater than right uncovertebral hypertrophy without
significant foraminal encroachment.

C3-C4: Broad-based left paracentral disc osteophyte complex indents
the left ventral thecal sac (series 9, image 9). Resultant mild
spinal stenosis with mild flattening of the left hemi cord. Right
worse than left uncovertebral hypertrophy with resultant moderate
right C4 foraminal stenosis.

C4-C5: Mild disc bulge with uncovertebral hypertrophy. Superimposed
central disc protrusion indents the ventral thecal sac, contacting
and mildly flattening the ventral cord. Resultant mild spinal
stenosis. Right worse than left uncovertebral hypertrophy with
resultant mild to moderate right C5 foraminal stenosis. Left neural
foramina remains patent.

C5-C6: Diffuse disc bulge with bilateral uncovertebral hypertrophy.
Superimposed central disc protrusion indents the ventral thecal sac,
slightly asymmetric to the right (series 9, image 19). Mild
flattening of the right hemi cord with mild spinal stenosis. Mild
right C6 foraminal narrowing.

C6-C7: Mild disc bulge with right greater than left uncovertebral
hypertrophy. No significant spinal stenosis. Mild right C7 foraminal
narrowing.

C7-T1:  Unremarkable.
IMPRESSION: MRI HEAD IMPRESSION:

1. Patchy multifocal T2/FLAIR signal abnormality involving the
supratentorial and infratentorial white matter as above, consistent
with provided history of multiple sclerosis. No evidence for active
demyelination.
2. Otherwise unremarkable brain MRI for age. No other acute
intracranial abnormality.

MRA HEAD IMPRESSION:

Normal intracranial MRA. No large vessel occlusion or
hemodynamically significant stenosis.

MRI CERVICAL SPINE IMPRESSION:

1. Extensive patchy signal abnormality throughout the cervical
spinal cord, consistent with multiple sclerosis. No abnormal
enhancement to suggest active demyelination.
2. Multilevel cervical spondylosis with resultant mild diffuse
spinal stenosis at C3-4 through C5-6. Mild to moderate right C4
through C6 foraminal narrowing as above.

## 2020-10-29 IMAGING — CT CT ABD-PELV W/ CM
2 of 5 series · 16 of 46 positions shown, 18 images · IV contrast (Omnipaque or Isovue)
Comparison: CT abdomen pelvis dated [DATE].

CLINICAL DATA: 49-year-old male with.

EXAM:
CT ABDOMEN AND PELVIS WITH CONTRAST
TECHNIQUE: Multidetector CT imaging of the abdomen and pelvis was performed
using the standard protocol following bolus administration of
intravenous contrast.
CONTRAST:  100mL OMNIPAQUE IOHEXOL 300 MG/ML  SOLN

[Series 2: axial st · axial · 0.89mm/px · z∈[+720,+1185]mm · 13 of 105 slices shown, 15 images]
[im 6/105  soft-tissue]
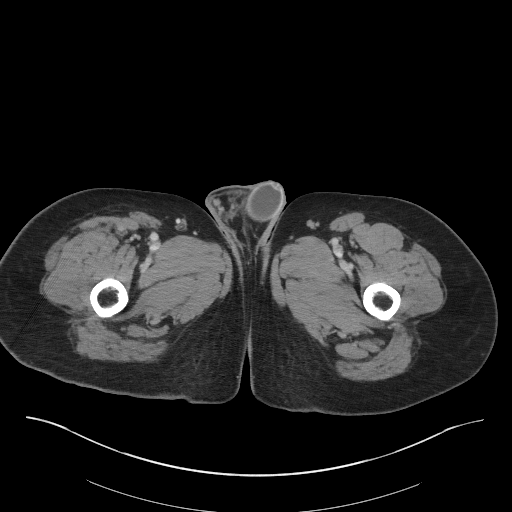
[im 6/105  bone]
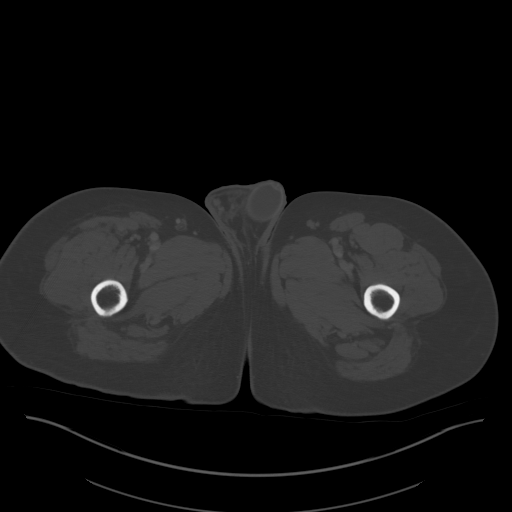
[im 12/105  soft-tissue]
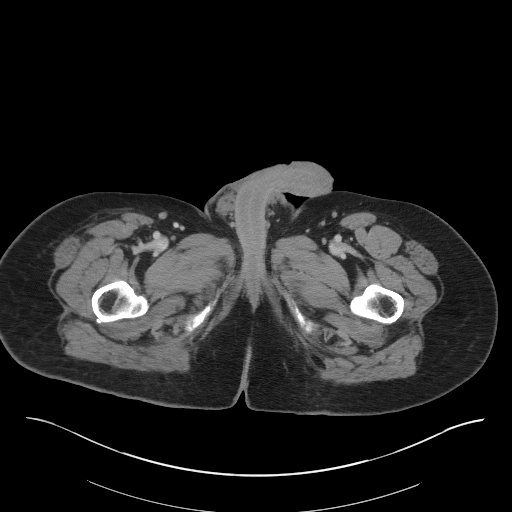
[im 24/105  soft-tissue]
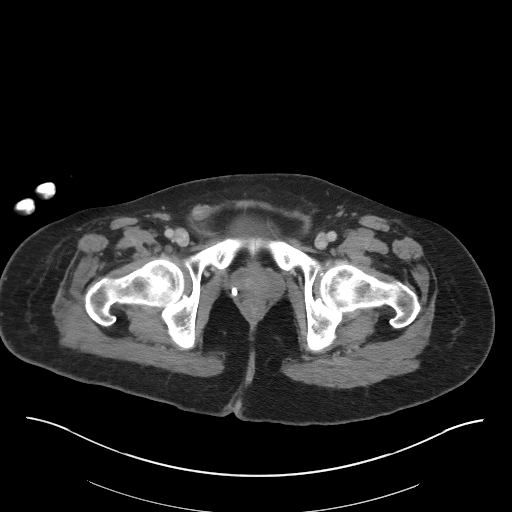
[im 29/105  soft-tissue]
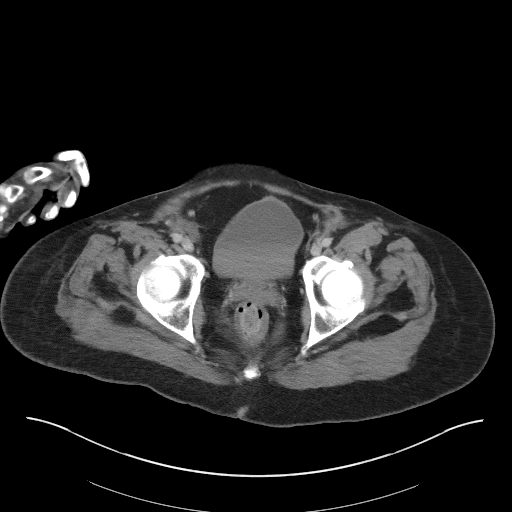
[im 35/105  soft-tissue]
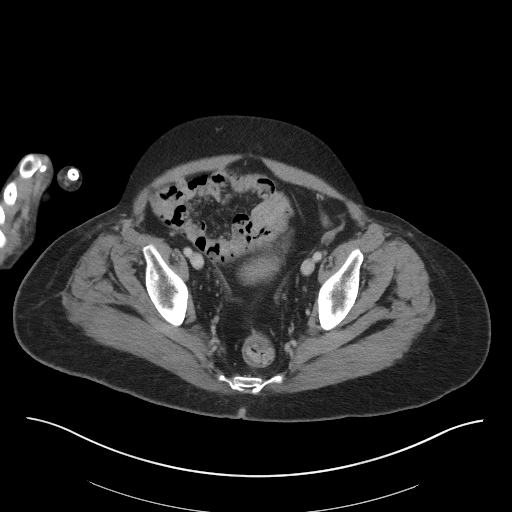
[im 47/105  soft-tissue]
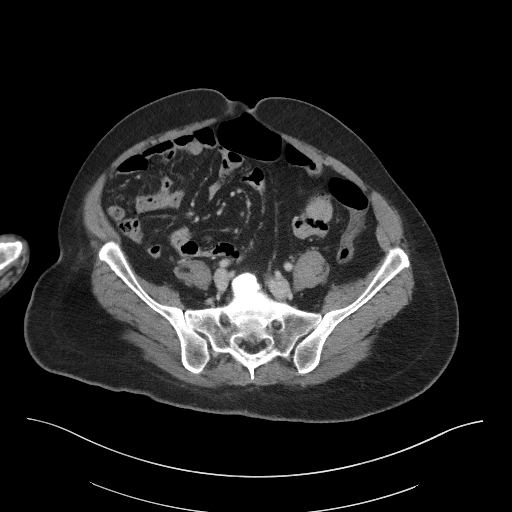
[im 53/105  soft-tissue]
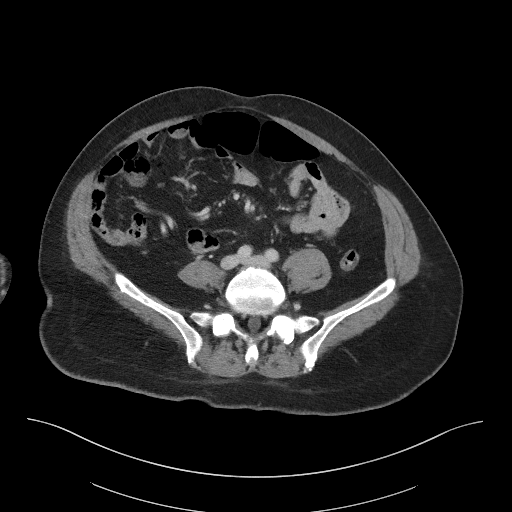
[im 58/105  soft-tissue]
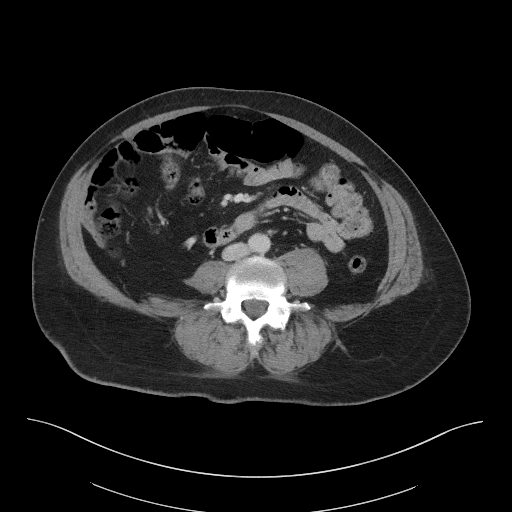
[im 70/105  soft-tissue]
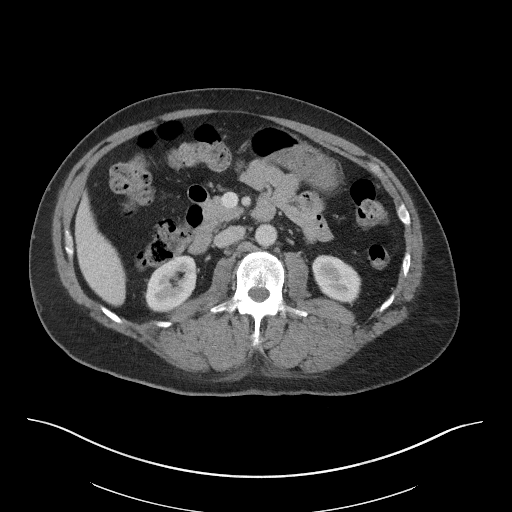
[im 70/105  bone]
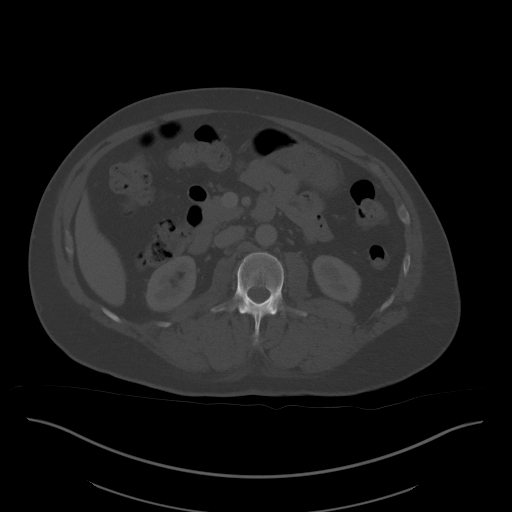
[im 76/105  soft-tissue]
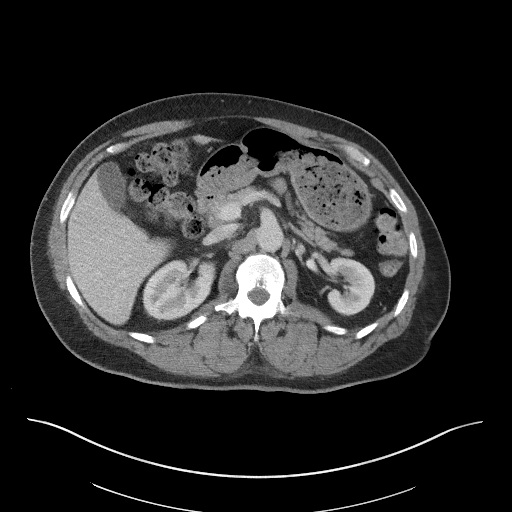
[im 81/105  soft-tissue]
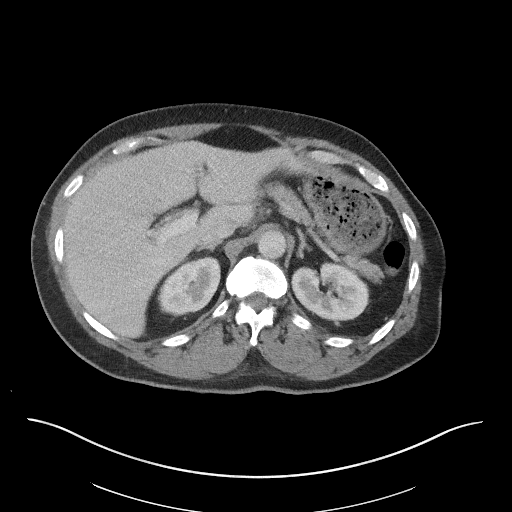
[im 93/105  soft-tissue]
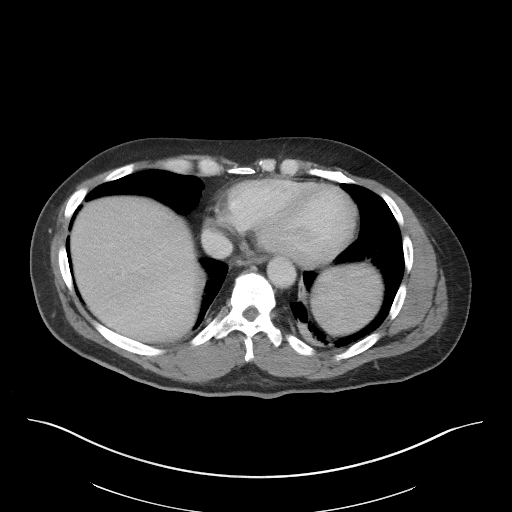
[im 99/105  soft-tissue]
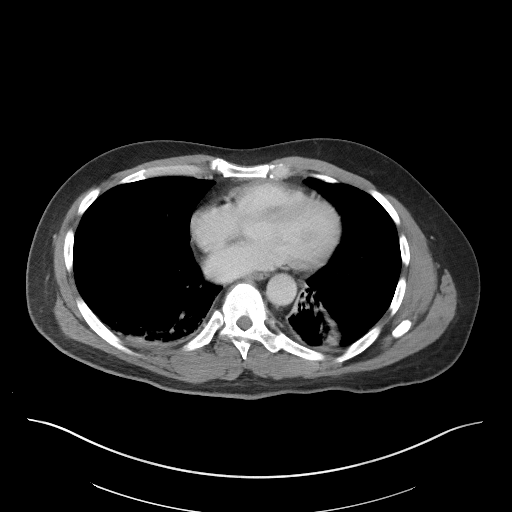

[Series 5: coronal st · coronal · 0.79mm/px · 3 of 103 slices shown]
[im 35/103  soft-tissue]
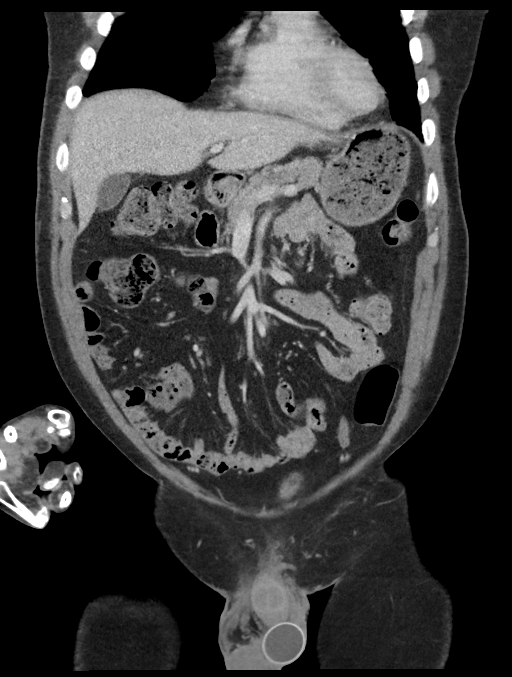
[im 46/103  soft-tissue]
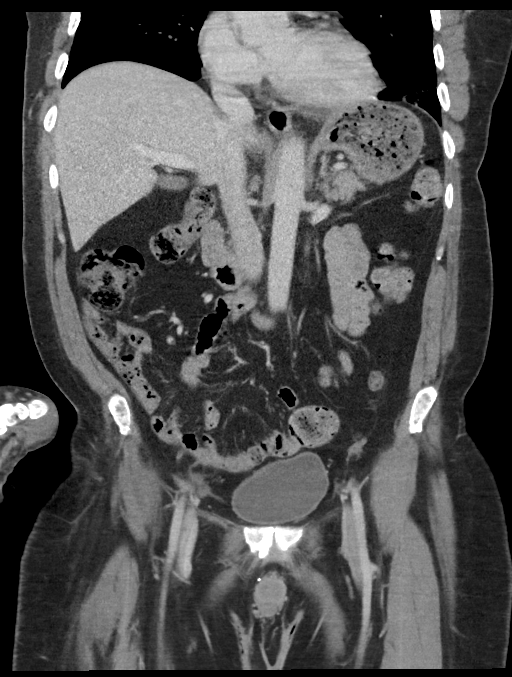
[im 57/103  soft-tissue]
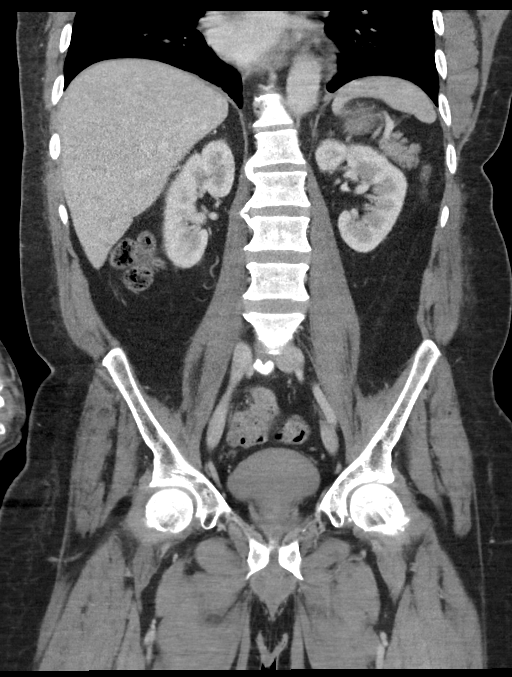

[16 of 46 positions shown; findings below may reference images not displayed]

FINDINGS: Lower chest: Bilateral patchy and streaky densities, likely
atelectasis/scarring. Pneumonia is less likely. Clinical correlation
recommended.

No intra-abdominal free air or free fluid.

Hepatobiliary: The liver is unremarkable. No intrahepatic biliary
ductal dilatation. The gallbladder is unremarkable.

Pancreas: Unremarkable. No pancreatic ductal dilatation or
surrounding inflammatory changes.

Spleen: Normal in size without focal abnormality.

Adrenals/Urinary Tract: The adrenal glands are unremarkable. There
is no hydronephrosis on either side. There is symmetric enhancement
and excretion of contrast by both kidneys. The visualized ureters
appear unremarkable. The urinary bladder is partially distended and
grossly unremarkable.

Stomach/Bowel: Moderate stool throughout the colon. There is no
bowel obstruction or active inflammation. The appendix is normal.

Vascular/Lymphatic: The abdominal aorta and IVC unremarkable no
portal venous gas. There is no adenopathy.

Reproductive: The prostate and seminal vesicles are grossly
unremarkable. Left testicular prosthesis.

Other: Small fat containing umbilical hernia. No fluid collection or
inflammatory changes.

Musculoskeletal: Degenerative changes of the spine. No acute osseous
pathology.
IMPRESSION: 1. No acute intra-abdominal or pelvic pathology.
2. Moderate colonic stool burden. No bowel obstruction. Normal
appendix.

## 2020-10-29 IMAGING — DX DG HIP (WITH OR WITHOUT PELVIS) 5+V BILAT
5 series · 5 of 5 positions shown · non-contrast
Comparison: CT abdomen and pelvis [DATE]

CLINICAL DATA: Fall.  Bilateral hip and abdominal pain.

EXAM:
DG HIP (WITH OR WITHOUT PELVIS) 5+V BILAT

[pelvis ap]
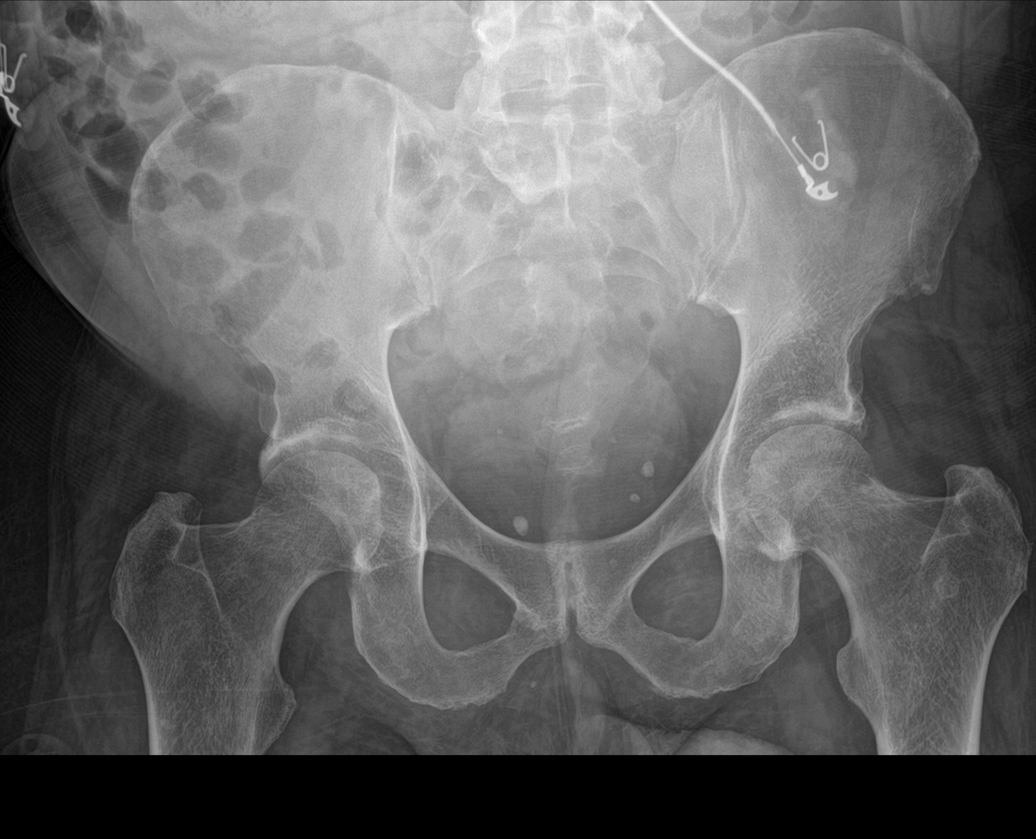

[hip ap (1 of 2)]
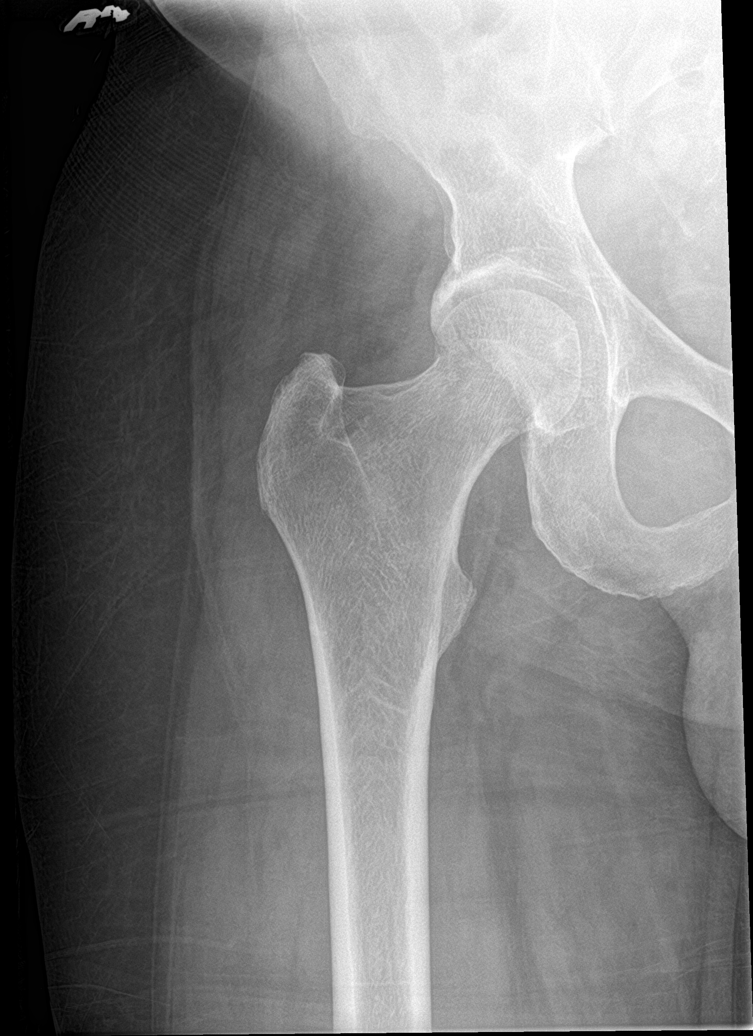

[hip lat (1 of 2)]
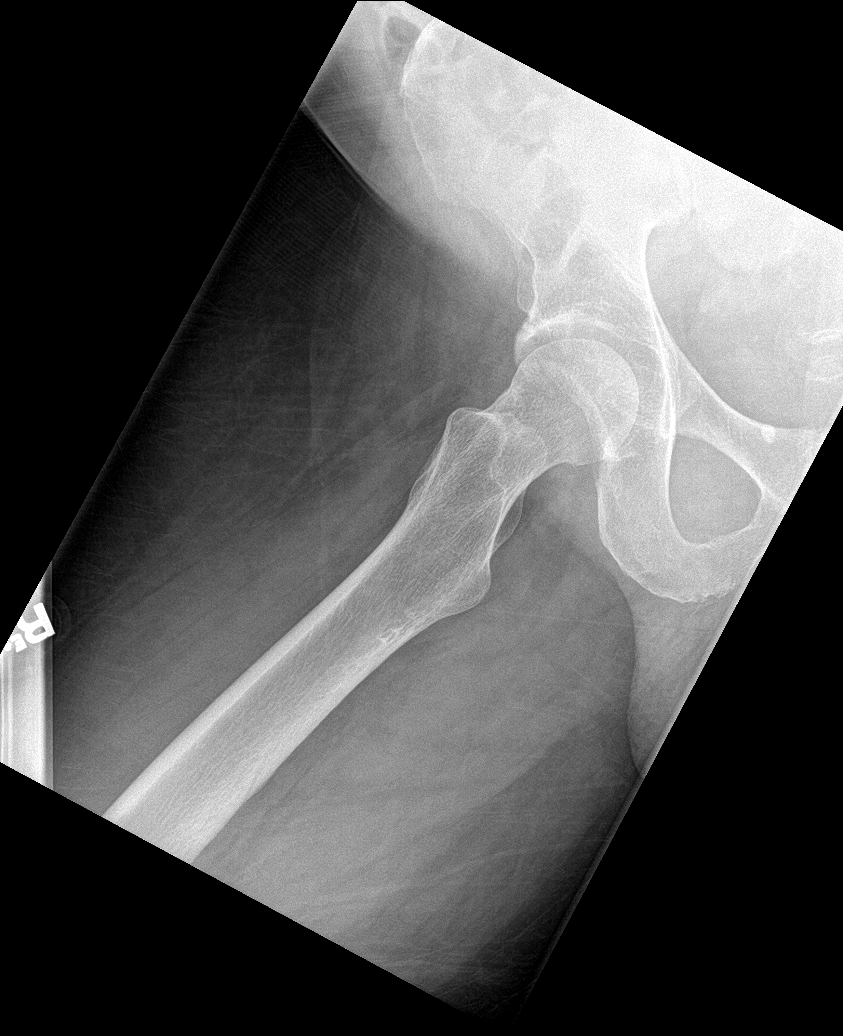

[hip ap (2 of 2)]
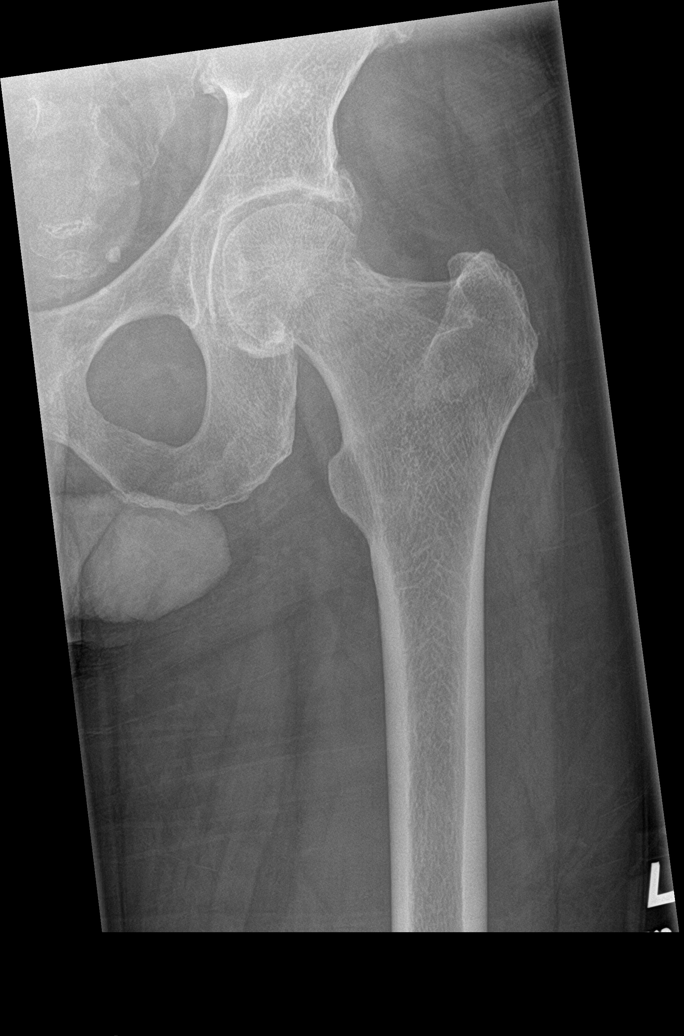

[hip lat (2 of 2)]
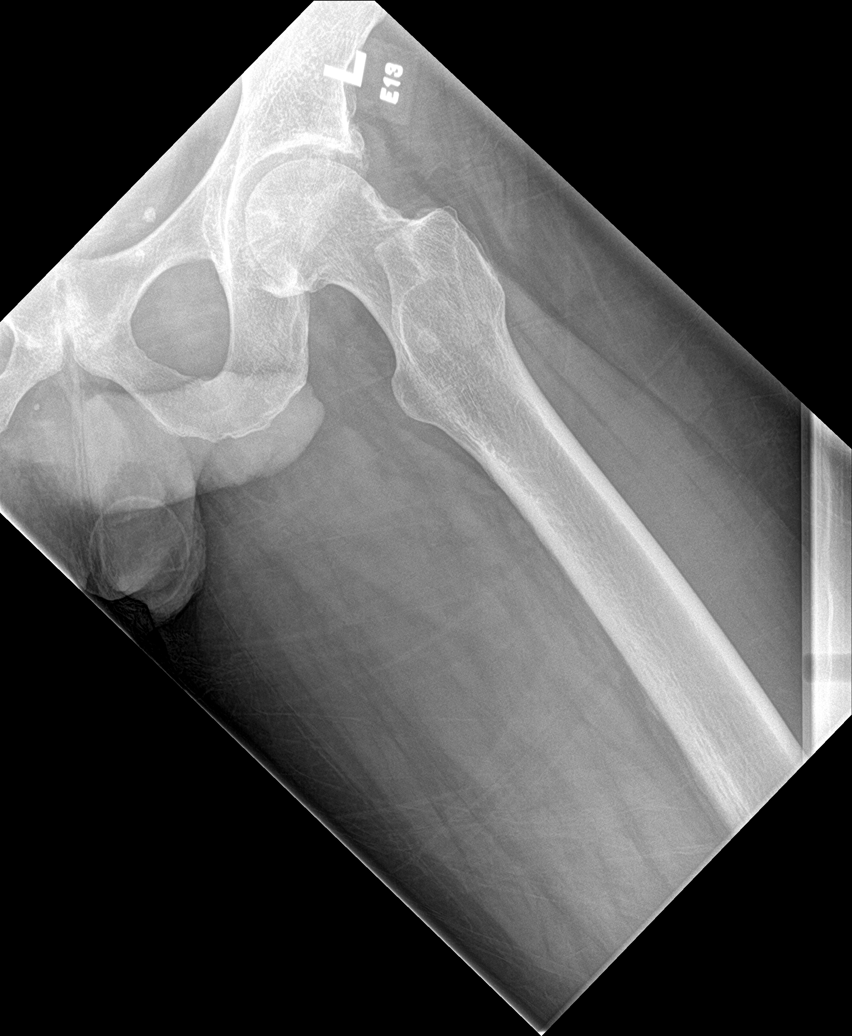

[5 of 5 positions shown; findings below may reference images not displayed]

FINDINGS: No fracture or hip dislocation is identified. Hip joint space widths
are preserved. Small calcifications in the pelvis reflect
phleboliths.
IMPRESSION: No acute osseous abnormality identified.

## 2020-10-29 MED ORDER — GADOBUTROL 1 MMOL/ML IV SOLN
10.0000 mL | Freq: Once | INTRAVENOUS | Status: AC | PRN
  Administered 2020-10-29: 10 mL via INTRAVENOUS

## 2020-10-29 MED ORDER — SODIUM CHLORIDE 0.9 % IV BOLUS
1000.0000 mL | Freq: Once | INTRAVENOUS | Status: AC
  Administered 2020-10-29: 1000 mL via INTRAVENOUS

## 2020-10-29 MED ORDER — IOHEXOL 300 MG/ML  SOLN
100.0000 mL | Freq: Once | INTRAMUSCULAR | Status: AC | PRN
  Administered 2020-10-29: 100 mL via INTRAVENOUS

## 2020-10-29 NOTE — ED Provider Notes (Signed)
[facility] EMERGENCY DEPARTMENT Provider Note   CSN: [id number] Arrival date & time: 10/29/20  1351     History Chief Complaint  Patient presents with  . Fall    Vincent Anthony is a 50 y.o. male history of multiple sclerosis, GERD, arthritis, chronic pain.  Patient arrived via EMS today after a fall that occurred just prior to arrival.  Patient reports that he has been having increasing difficulty with ambulation for the past few months.  He reports due to his MS he has bilateral leg weakness as well as weakness of his upper extremities and normally ambulates with a walker.  He reports that due to the snow and ice he missed his neurologist appointment at [facility] on 09/30/2020 and has not had a follow-up appointment.  Patient reports that he had gotten up to use the bathroom today was using his walker when he felt both of his legs give out from under him, he reports that he fell while holding onto his walker towards his left side.  He denies any injury from his fall, he was eventually helped up off the floor by his wife.  He is concerned that due to his increasing weakness he is not able to perform ADLs at his baseline.  Of note patient does report he has had increasing bilateral hip pain over the past few weeks but denies any injury of those areas.  Denies recent illness, fever/chills, head injury, headache, blood thinner use, difficulty speaking, vision changes, neck pain, chest pain, back pain, abdominal pain, upper extremity pain, lower extremity injury, numbness/tingling, weakness, nausea/vomiting, diarrhea or any additional concerns.  HPI     Past Medical History:  Diagnosis Date  . Arthritis   . Elevated LFTs   . Elevated liver enzymes   . GERD (gastroesophageal reflux disease)   . Headache   . MS (multiple sclerosis) (HCC)   . Tremor     Patient Active Problem List   Diagnosis Date Noted  . MS (multiple sclerosis) (HCC) 03/08/2018  . 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 History of urinary stone 02/03/2018  . Urge incontinence of urine 02/03/2018  . Abdominal pain 01/05/2017  . Elevated LFTs 11/05/2016  . Neuropathic pain 05/23/2015  . Arthralgia of both knees 05/23/2015  . Urinary tract stones 01/03/2015  . Chronic pain syndrome 03/20/2014  . 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 Bilateral knee pain 01/29/2013  . 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 Ongoing use of possibly toxic medication 10/11/2012  . Acne 01/13/2012  . Seborrheic dermatitis 01/13/2012  . Neurogenic bladder 11/13/2011  . Urinary urgency 11/13/2011    Past Surgical History:  Procedure Laterality Date  . COLONOSCOPY     2-4 years ago  . HERNIA REPAIR    . KNEE SURGERY    . TESTICLE REMOVAL         Family History  Problem Relation Age of Onset  . Heart attack Mother   . Diabetes Father   . Hypertension Father   . Colon cancer Neg Hx     Social History   Tobacco Use  . Smoking status: Never Smoker  . Smokeless tobacco: Never Used  Substance Use Topics  . Alcohol use: No    Comment: occasionally  . Drug use: No    Home Medications Prior to Admission medications   Medication Sig Start Date End Date Taking? Authorizing Provider  amitriptyline (ELAVIL) 10 MG tablet Take 1 tablet by mouth at bedtime.  11/09/14  Yes [provider]  baclofen (LIORESAL) 10 MG tablet Take 1 tablet by mouth 3 (three) times daily. 11/09/14  Yes [provider]  cetirizine (ZYRTEC) 10 MG tablet Take 10 mg by mouth daily.   Yes [provider]  Cholecalciferol (VITAMIN D3) 50 MCG (2000 UT) TABS Take 2,000 Units by mouth in the morning.   Yes [provider]  clonazePAM (KLONOPIN) 1 MG tablet Take 1 tablet by mouth 2 (two) times daily. Or as needed 10/30/14  Yes [provider]  cyclobenzaprine (FLEXERIL) 5 MG tablet Take 5 mg by mouth 3 (three) times daily as needed.   Yes [provider]  fluticasone (FLONASE) 50  MCG/ACT nasal spray Place 2 sprays into both nostrils daily as needed for allergies or rhinitis.   Yes [provider]  GNP FIBER-CAPS 625 MG tablet Take 625 mg by mouth daily.  05/03/18  Yes [provider]  hyoscyamine (LEVBID) 0.375 MG 12 hr tablet TAKE (1) TABLET BYMOUTH EVERY TWELVE HOURS. 09/16/20  Yes Gelene Mink, NP  ibuprofen (ADVIL) 800 MG tablet Take 800 mg by mouth every 8 (eight) hours as needed for mild pain or moderate pain. 03/05/20  Yes [provider]  levETIRAcetam (KEPPRA) 250 MG tablet Take 2 tablets by mouth 2 (two) times daily.  11/09/14  Yes [provider]  metFORMIN (GLUCOPHAGE) 500 MG tablet Take 500 mg by mouth 2 (two) times daily.   Yes [provider]  Ocrelizumab (OCREVUS IV) Inject into the vein. Every 6 months for MS   Yes [provider]  pravastatin (PRAVACHOL) 20 MG tablet Take 20 mg by mouth every morning.    Yes [provider]  pregabalin (LYRICA) 75 MG capsule 1 in the evening and 2 at bedtime   Yes [provider]  VESICARE 10 MG tablet Take 1 tablet by mouth every morning.  11/09/14  Yes [provider]  nitrofurantoin, macrocrystal-monohydrate, (MACROBID) 100 MG capsule Take 1 capsule (100 mg total) by mouth 2 (two) times daily. Patient not taking: No sig reported 08/06/20   Wurst, [place], PA-C    Allergies    Celebrex [celecoxib], Vioxx [rofecoxib], Zanaflex [tizanidine hcl], and Clindamycin  Review of Systems   Review of Systems Ten systems are reviewed and are negative for acute change except as noted in the HPI  Physical Exam Updated Vital Signs BP 130/90   Pulse 80   Temp 97.7 F (36.5 C) (Oral)   Resp 18   Ht 6\' 3"  (1.905 m)   Wt 99.8 kg   SpO2 100%   BMI 27.50 kg/m   Physical Exam Constitutional:      General: He is not in acute distress.    Appearance: Normal appearance. He is well-developed. He is not ill-appearing or diaphoretic.  HENT:     Head:  Normocephalic and atraumatic. No raccoon eyes or Battle's sign.     Jaw: There is normal jaw occlusion. No trismus.     Right Ear: External ear normal. No hemotympanum.     Left Ear: External ear normal. No hemotympanum.     Nose: Nose normal.     Mouth/Throat:     Mouth: Mucous membranes are moist.     Pharynx: Oropharynx is clear.  Eyes:     General: Vision grossly intact. Gaze aligned appropriately.     Extraocular Movements: Extraocular movements intact.     Conjunctiva/sclera: Conjunctivae normal.     Pupils: Pupils are equal, round, and reactive to light.  Neck:     Trachea: Trachea and phonation normal. No tracheal tenderness or tracheal deviation.  Cardiovascular:     Rate and Rhythm: Normal rate and regular rhythm.  Pulmonary:     Effort: Pulmonary effort is normal. No respiratory distress.     Breath sounds: Normal breath sounds and air entry.  Chest:     Chest wall: No deformity, tenderness or crepitus.  Abdominal:     General: There is no distension. There are no signs of injury.     Palpations: Abdomen is soft.     Tenderness: There is no abdominal tenderness. There is no guarding or rebound.  Musculoskeletal:        General: Normal range of motion.     Cervical back: Normal range of motion and neck supple.     Comments: No midline C/T/L spinal tenderness to palpation, no paraspinal muscle tenderness, no deformity, crepitus, or step-off noted. No sign of injury to the neck or back.  Bony pelvis stable to compression bilaterally without pain. - Patient with chronic weakness and contracture of the right upper extremity.  All major joints of bilateral upper extremities palpated and mobilized as to patient's baseline range of motion without pain crepitus or deformity.  Bilateral hips knees ankles and toes mobilized without pain crepitus or deformity.  Skin:    General: Skin is warm and dry.  Neurological:     Mental Status: He is alert.     GCS: GCS eye subscore is 4. GCS  verbal subscore is 5. GCS motor subscore is 6.     Comments: Speech is clear and goal oriented, follows commands Major Cranial nerves without deficit, no facial droop Moves extremities without ataxia, coordination intact  Psychiatric:        Behavior: Behavior normal.     ED Results / Procedures / Treatments   Labs (all labs ordered are listed, but only abnormal results are displayed) Labs Reviewed  CBC WITH DIFFERENTIAL/PLATELET - Abnormal; Notable for the following components:      Result Value   Neutro Abs 9.3 (*)    Lymphs Abs 0.4 (*)    All other components within normal limits  BASIC METABOLIC PANEL  URINALYSIS, ROUTINE W REFLEX MICROSCOPIC    EKG None  Radiology MR ANGIO HEAD WO CONTRAST  Result Date: 10/29/2020 EXAM: MRI HEAD WITHOUT AND WITH CONTRAST MRI CERVICAL SPINE WITHOUT AND WITH CONTRAST MRA HEAD WITHOUT CONTRAST TECHNIQUE: Multiplanar, multiecho pulse sequences of the brain and surrounding structures, and cervical spine, to include the craniocervical junction and cervicothoracic junction, were obtained without and with intravenous contrast. Additional angiographic images of the intracranial circulation and circle-of-Willis were performed without the administration of IV contrast. CONTRAST:  24mL GADAVIST GADOBUTROL 1 MMOL/ML IV SOLN COMPARISON:  None. FINDINGS: MRI HEAD FINDINGS Brain: Cerebral volume within normal limits for age. Patchy multifocal foci of T2/FLAIR hyperintensity seen involving the periventricular, deep, in juxta cortical white matter of both cerebral hemispheres. Several of these foci radiated in a perpendicular fashion from the lateral ventricles. Callososeptal involvement. Patchy infratentorial involvement noted about the fourth ventricle and right pons. Multiple scattered corresponding T1 black holes. Findings consistent with provided history of multiple sclerosis. No associated restricted diffusion or enhancement to suggest active demyelination. No  evidence for acute or subacute infarct. Gray-white matter differentiation otherwise maintained. No encephalomalacia to suggest chronic cortical infarction. No evidence for acute or chronic intracranial hemorrhage. No mass lesion, midline shift or mass effect. No hydrocephalus or extra-axial fluid collection. Pituitary  gland suprasellar region within normal limits. Midline structures intact. No other abnormal enhancement. Vascular: Major intracranial vascular flow voids are maintained. Skull and upper cervical spine: Craniocervical junction within normal limits. Bone marrow signal intensity normal. No scalp soft tissue abnormality. Sinuses/Orbits: Globes and orbital soft tissues within normal limits. Right maxillary sinus retention cyst noted. Paranasal sinuses are otherwise clear. No mastoid effusion. Inner ear structures grossly normal. Other: None. MRA HEAD FINDINGS ANTERIOR CIRCULATION: Examination mildly degraded by motion artifact. Visualized distal cervical segments of the internal carotid arteries are patent with antegrade flow. Petrous, cavernous, and supraclinoid segments patent without stenosis or other abnormality. A1 segments widely patent. Normal anterior communicating artery complex. Anterior cerebral arteries patent to their distal aspects without stenosis. No M1 stenosis or occlusion. Normal MCA bifurcations. Distal MCA branches well perfused and symmetric. POSTERIOR CIRCULATION: Both V4 segments patent to the vertebrobasilar junction without stenosis. Right vertebral dominant. Partially visualized left PICA patent. Right PICA not seen. Basilar patent to its distal aspect without stenosis. Superior cerebellar arteries patent bilaterally. Both PCA supplied via the basilar as well as prominent bilateral posterior communicating arteries. PCAs grossly perfused to their distal aspects without appreciable stenosis. No intracranial aneurysm. MRI CERVICAL SPINE FINDINGS Alignment: Straightening of the normal  cervical lordosis. No listhesis. Vertebrae: Vertebral body height maintained without acute or chronic fracture. Bone marrow signal intensity somewhat diffusely heterogeneous but within normal limits. No discrete or worrisome osseous lesions. No abnormal marrow edema or enhancement. Cord: Extensive patchy signal abnormality seen throughout the cervical spinal cord, extending from C1-2 through C6-7, with involvement of nearly all levels. Overall, involvement is slightly greater within the right hemi cord as compared to the left. Underlying cord atrophy. Finding consistent with multiple sclerosis. No abnormal enhancement to suggest active demyelination. Posterior Fossa, vertebral arteries, paraspinal tissues: Paraspinous and prevertebral soft tissues within normal limits. Normal flow voids seen within the vertebral arteries bilaterally. Disc levels: C2-C3: Small central disc protrusion indents the ventral thecal sac. Associated annular fissure. No significant spinal stenosis. Mild left greater than right uncovertebral hypertrophy without significant foraminal encroachment. C3-C4: Broad-based left paracentral disc osteophyte complex indents the left ventral thecal sac (series 9, image 9). Resultant mild spinal stenosis with mild flattening of the left hemi cord. Right worse than left uncovertebral hypertrophy with resultant moderate right C4 foraminal stenosis. C4-C5: Mild disc bulge with uncovertebral hypertrophy. Superimposed central disc protrusion indents the ventral thecal sac, contacting and mildly flattening the ventral cord. Resultant mild spinal stenosis. Right worse than left uncovertebral hypertrophy with resultant mild to moderate right C5 foraminal stenosis. Left neural foramina remains patent. C5-C6: Diffuse disc bulge with bilateral uncovertebral hypertrophy. Superimposed central disc protrusion indents the ventral thecal sac, slightly asymmetric to the right (series 9, image 19). Mild flattening of the  right hemi cord with mild spinal stenosis. Mild right C6 foraminal narrowing. C6-C7: Mild disc bulge with right greater than left uncovertebral hypertrophy. No significant spinal stenosis. Mild right C7 foraminal narrowing. C7-T1:  Unremarkable. IMPRESSION: MRI HEAD IMPRESSION: 1. Patchy multifocal T2/FLAIR signal abnormality involving the supratentorial and infratentorial white matter as above, consistent with provided history of multiple sclerosis. No evidence for active demyelination. 2. Otherwise unremarkable brain MRI for age. No other acute intracranial abnormality. MRA HEAD IMPRESSION: Normal intracranial MRA. No large vessel occlusion or hemodynamically significant stenosis. MRI CERVICAL SPINE IMPRESSION: 1. Extensive patchy signal abnormality throughout the cervical spinal cord, consistent with multiple sclerosis. No abnormal enhancement to suggest active demyelination. 2. Multilevel cervical spondylosis with resultant  mild diffuse spinal stenosis at C3-4 through C5-6. Mild to moderate right C4 through C6 foraminal narrowing as above. Electronically Signed   By: Rise MuBenjamin  McClintock M.D.   On: 10/29/2020 19:33   MR Brain W and Wo Contrast  Result Date: 10/29/2020 EXAM: MRI HEAD WITHOUT AND WITH CONTRAST MRI CERVICAL SPINE WITHOUT AND WITH CONTRAST MRA HEAD WITHOUT CONTRAST TECHNIQUE: Multiplanar, multiecho pulse sequences of the brain and surrounding structures, and cervical spine, to include the craniocervical junction and cervicothoracic junction, were obtained without and with intravenous contrast. Additional angiographic images of the intracranial circulation and circle-of-Willis were performed without the administration of IV contrast. CONTRAST:  10mL GADAVIST GADOBUTROL 1 MMOL/ML IV SOLN COMPARISON:  None. FINDINGS: MRI HEAD FINDINGS Brain: Cerebral volume within normal limits for age. Patchy multifocal foci of T2/FLAIR hyperintensity seen involving the periventricular, deep, in juxta cortical  white matter of both cerebral hemispheres. Several of these foci radiated in a perpendicular fashion from the lateral ventricles. Callososeptal involvement. Patchy infratentorial involvement noted about the fourth ventricle and right pons. Multiple scattered corresponding T1 black holes. Findings consistent with provided history of multiple sclerosis. No associated restricted diffusion or enhancement to suggest active demyelination. No evidence for acute or subacute infarct. Gray-white matter differentiation otherwise maintained. No encephalomalacia to suggest chronic cortical infarction. No evidence for acute or chronic intracranial hemorrhage. No mass lesion, midline shift or mass effect. No hydrocephalus or extra-axial fluid collection. Pituitary gland suprasellar region within normal limits. Midline structures intact. No other abnormal enhancement. Vascular: Major intracranial vascular flow voids are maintained. Skull and upper cervical spine: Craniocervical junction within normal limits. Bone marrow signal intensity normal. No scalp soft tissue abnormality. Sinuses/Orbits: Globes and orbital soft tissues within normal limits. Right maxillary sinus retention cyst noted. Paranasal sinuses are otherwise clear. No mastoid effusion. Inner ear structures grossly normal. Other: None. MRA HEAD FINDINGS ANTERIOR CIRCULATION: Examination mildly degraded by motion artifact. Visualized distal cervical segments of the internal carotid arteries are patent with antegrade flow. Petrous, cavernous, and supraclinoid segments patent without stenosis or other abnormality. A1 segments widely patent. Normal anterior communicating artery complex. Anterior cerebral arteries patent to their distal aspects without stenosis. No M1 stenosis or occlusion. Normal MCA bifurcations. Distal MCA branches well perfused and symmetric. POSTERIOR CIRCULATION: Both V4 segments patent to the vertebrobasilar junction without stenosis. Right vertebral  dominant. Partially visualized left PICA patent. Right PICA not seen. Basilar patent to its distal aspect without stenosis. Superior cerebellar arteries patent bilaterally. Both PCA supplied via the basilar as well as prominent bilateral posterior communicating arteries. PCAs grossly perfused to their distal aspects without appreciable stenosis. No intracranial aneurysm. MRI CERVICAL SPINE FINDINGS Alignment: Straightening of the normal cervical lordosis. No listhesis. Vertebrae: Vertebral body height maintained without acute or chronic fracture. Bone marrow signal intensity somewhat diffusely heterogeneous but within normal limits. No discrete or worrisome osseous lesions. No abnormal marrow edema or enhancement. Cord: Extensive patchy signal abnormality seen throughout the cervical spinal cord, extending from C1-2 through C6-7, with involvement of nearly all levels. Overall, involvement is slightly greater within the right hemi cord as compared to the left. Underlying cord atrophy. Finding consistent with multiple sclerosis. No abnormal enhancement to suggest active demyelination. Posterior Fossa, vertebral arteries, paraspinal tissues: Paraspinous and prevertebral soft tissues within normal limits. Normal flow voids seen within the vertebral arteries bilaterally. Disc levels: C2-C3: Small central disc protrusion indents the ventral thecal sac. Associated annular fissure. No significant spinal stenosis. Mild left greater than right uncovertebral hypertrophy without  significant foraminal encroachment. C3-C4: Broad-based left paracentral disc osteophyte complex indents the left ventral thecal sac (series 9, image 9). Resultant mild spinal stenosis with mild flattening of the left hemi cord. Right worse than left uncovertebral hypertrophy with resultant moderate right C4 foraminal stenosis. C4-C5: Mild disc bulge with uncovertebral hypertrophy. Superimposed central disc protrusion indents the ventral thecal sac,  contacting and mildly flattening the ventral cord. Resultant mild spinal stenosis. Right worse than left uncovertebral hypertrophy with resultant mild to moderate right C5 foraminal stenosis. Left neural foramina remains patent. C5-C6: Diffuse disc bulge with bilateral uncovertebral hypertrophy. Superimposed central disc protrusion indents the ventral thecal sac, slightly asymmetric to the right (series 9, image 19). Mild flattening of the right hemi cord with mild spinal stenosis. Mild right C6 foraminal narrowing. C6-C7: Mild disc bulge with right greater than left uncovertebral hypertrophy. No significant spinal stenosis. Mild right C7 foraminal narrowing. C7-T1:  Unremarkable. IMPRESSION: MRI HEAD IMPRESSION: 1. Patchy multifocal T2/FLAIR signal abnormality involving the supratentorial and infratentorial white matter as above, consistent with provided history of multiple sclerosis. No evidence for active demyelination. 2. Otherwise unremarkable brain MRI for age. No other acute intracranial abnormality. MRA HEAD IMPRESSION: Normal intracranial MRA. No large vessel occlusion or hemodynamically significant stenosis. MRI CERVICAL SPINE IMPRESSION: 1. Extensive patchy signal abnormality throughout the cervical spinal cord, consistent with multiple sclerosis. No abnormal enhancement to suggest active demyelination. 2. Multilevel cervical spondylosis with resultant mild diffuse spinal stenosis at C3-4 through C5-6. Mild to moderate right C4 through C6 foraminal narrowing as above. Electronically Signed   By: Rise Mu M.D.   On: 10/29/2020 19:33   MR Cervical Spine W or Wo Contrast  Result Date: 10/29/2020 EXAM: MRI HEAD WITHOUT AND WITH CONTRAST MRI CERVICAL SPINE WITHOUT AND WITH CONTRAST MRA HEAD WITHOUT CONTRAST TECHNIQUE: Multiplanar, multiecho pulse sequences of the brain and surrounding structures, and cervical spine, to include the craniocervical junction and cervicothoracic junction, were  obtained without and with intravenous contrast. Additional angiographic images of the intracranial circulation and circle-of-Willis were performed without the administration of IV contrast. CONTRAST:  80mL GADAVIST GADOBUTROL 1 MMOL/ML IV SOLN COMPARISON:  None. FINDINGS: MRI HEAD FINDINGS Brain: Cerebral volume within normal limits for age. Patchy multifocal foci of T2/FLAIR hyperintensity seen involving the periventricular, deep, in juxta cortical white matter of both cerebral hemispheres. Several of these foci radiated in a perpendicular fashion from the lateral ventricles. Callososeptal involvement. Patchy infratentorial involvement noted about the fourth ventricle and right pons. Multiple scattered corresponding T1 black holes. Findings consistent with provided history of multiple sclerosis. No associated restricted diffusion or enhancement to suggest active demyelination. No evidence for acute or subacute infarct. Gray-white matter differentiation otherwise maintained. No encephalomalacia to suggest chronic cortical infarction. No evidence for acute or chronic intracranial hemorrhage. No mass lesion, midline shift or mass effect. No hydrocephalus or extra-axial fluid collection. Pituitary gland suprasellar region within normal limits. Midline structures intact. No other abnormal enhancement. Vascular: Major intracranial vascular flow voids are maintained. Skull and upper cervical spine: Craniocervical junction within normal limits. Bone marrow signal intensity normal. No scalp soft tissue abnormality. Sinuses/Orbits: Globes and orbital soft tissues within normal limits. Right maxillary sinus retention cyst noted. Paranasal sinuses are otherwise clear. No mastoid effusion. Inner ear structures grossly normal. Other: None. MRA HEAD FINDINGS ANTERIOR CIRCULATION: Examination mildly degraded by motion artifact. Visualized distal cervical segments of the internal carotid arteries are patent with antegrade flow.  Petrous, cavernous, and supraclinoid segments patent without stenosis or other  abnormality. A1 segments widely patent. Normal anterior communicating artery complex. Anterior cerebral arteries patent to their distal aspects without stenosis. No M1 stenosis or occlusion. Normal MCA bifurcations. Distal MCA branches well perfused and symmetric. POSTERIOR CIRCULATION: Both V4 segments patent to the vertebrobasilar junction without stenosis. Right vertebral dominant. Partially visualized left PICA patent. Right PICA not seen. Basilar patent to its distal aspect without stenosis. Superior cerebellar arteries patent bilaterally. Both PCA supplied via the basilar as well as prominent bilateral posterior communicating arteries. PCAs grossly perfused to their distal aspects without appreciable stenosis. No intracranial aneurysm. MRI CERVICAL SPINE FINDINGS Alignment: Straightening of the normal cervical lordosis. No listhesis. Vertebrae: Vertebral body height maintained without acute or chronic fracture. Bone marrow signal intensity somewhat diffusely heterogeneous but within normal limits. No discrete or worrisome osseous lesions. No abnormal marrow edema or enhancement. Cord: Extensive patchy signal abnormality seen throughout the cervical spinal cord, extending from C1-2 through C6-7, with involvement of nearly all levels. Overall, involvement is slightly greater within the right hemi cord as compared to the left. Underlying cord atrophy. Finding consistent with multiple sclerosis. No abnormal enhancement to suggest active demyelination. Posterior Fossa, vertebral arteries, paraspinal tissues: Paraspinous and prevertebral soft tissues within normal limits. Normal flow voids seen within the vertebral arteries bilaterally. Disc levels: C2-C3: Small central disc protrusion indents the ventral thecal sac. Associated annular fissure. No significant spinal stenosis. Mild left greater than right uncovertebral hypertrophy without  significant foraminal encroachment. C3-C4: Broad-based left paracentral disc osteophyte complex indents the left ventral thecal sac (series 9, image 9). Resultant mild spinal stenosis with mild flattening of the left hemi cord. Right worse than left uncovertebral hypertrophy with resultant moderate right C4 foraminal stenosis. C4-C5: Mild disc bulge with uncovertebral hypertrophy. Superimposed central disc protrusion indents the ventral thecal sac, contacting and mildly flattening the ventral cord. Resultant mild spinal stenosis. Right worse than left uncovertebral hypertrophy with resultant mild to moderate right C5 foraminal stenosis. Left neural foramina remains patent. C5-C6: Diffuse disc bulge with bilateral uncovertebral hypertrophy. Superimposed central disc protrusion indents the ventral thecal sac, slightly asymmetric to the right (series 9, image 19). Mild flattening of the right hemi cord with mild spinal stenosis. Mild right C6 foraminal narrowing. C6-C7: Mild disc bulge with right greater than left uncovertebral hypertrophy. No significant spinal stenosis. Mild right C7 foraminal narrowing. C7-T1:  Unremarkable. IMPRESSION: MRI HEAD IMPRESSION: 1. Patchy multifocal T2/FLAIR signal abnormality involving the supratentorial and infratentorial white matter as above, consistent with provided history of multiple sclerosis. No evidence for active demyelination. 2. Otherwise unremarkable brain MRI for age. No other acute intracranial abnormality. MRA HEAD IMPRESSION: Normal intracranial MRA. No large vessel occlusion or hemodynamically significant stenosis. MRI CERVICAL SPINE IMPRESSION: 1. Extensive patchy signal abnormality throughout the cervical spinal cord, consistent with multiple sclerosis. No abnormal enhancement to suggest active demyelination. 2. Multilevel cervical spondylosis with resultant mild diffuse spinal stenosis at C3-4 through C5-6. Mild to moderate right C4 through C6 foraminal narrowing as  above. Electronically Signed   By: Rise Mu M.D.   On: 10/29/2020 19:33   DG HIPS BILAT WITH PELVIS MIN 5 VIEWS  Result Date: 10/29/2020 CLINICAL DATA:  Fall.  Bilateral hip and abdominal pain. EXAM: DG HIP (WITH OR WITHOUT PELVIS) 5+V BILAT COMPARISON:  CT abdomen and pelvis 08/12/2010 FINDINGS: No fracture or hip dislocation is identified. Hip joint space widths are preserved. Small calcifications in the pelvis reflect phleboliths. IMPRESSION: No acute osseous abnormality identified. Electronically Signed   By: Freida Busman  Mosetta Putt M.D.   On: 10/29/2020 15:33    Procedures Procedures   Medications Ordered in ED Medications  gadobutrol (GADAVIST) 1 MMOL/ML injection 10 mL (10 mLs Intravenous Contrast Given 10/29/20 1902)    ED Course  I have reviewed the triage vital signs and the nursing notes.  Pertinent labs & imaging results that were available during my care of the patient were reviewed by me and considered in my medical decision making (see chart for details).  Clinical Course as of 10/29/20 2019  Tue Oct 29, 2020  1646 Teleneuro [BM]  1757 MRI brain/Cspine w and w/o contrast - Dr. Elige Radon [BM]  2002 Dr. Elige Radon [BM]    Clinical Course User Index [BM] Elizabeth Palau   MDM Rules/Calculators/A&P                         Additional history obtained from: 1. Nursing notes from this visit. 2. Review of EMR.  Patient had a telemedicine visit with his occupational therapist 10/24/2020, documentation of increasing difficulty getting around the house and leg weakness.  It appears they are in the process of giving him a motorized chair.  Patient had a telemedicine visit with neurology 09/30/2020 shows patient with worsening function and difficulty with ADLs since a fall in the summer 2021, shows patient required assistance with all ADLs and could not stand, it appears they were discussing rehab/skilled care facility placement.  It appears they are continuing with every 6 month  Ocrevus immunotherapy. 3. Family, wife at bedside. ------------------------------ 50 year old male with history as above presented for ongoing worsening of extremity weakness, he is unable to perform ADLs at home without assistance of his wife, he uses a walker occasionally had a fall today due to bilateral lower extremity weakness. He denies any injury from the fall did report some chronic bilateral hip pain which is not worse since his fall today. Will obtain basic labs and x-ray of his hips and monitor patient. Patient and his wife are both asking for skilled facility placement as they do not feel they are able to take care of the patient at home.  I ordered, reviewed and interpreted labs which include: CBC shows no leukocytosis to suggest infectious process, no anemia. BMP shows no emergent electrolyte derangement, AKI or gap. Urinalysis shows no evidence of infection.  DG Pelvis /w Bilateral Hips:    IMPRESSION:  No acute osseous abnormality identified.  - Patient reassessed he is resting comfortably in bed no acute distress, plan of care is consultation to telemetry neuro specialist for further input.  5:57 PM: Consult with neurologist Dr. Elige Radon, recommends MRI brain and cervical spine with and without contrast to evaluate for possible MS flare. No medication recommendations or further testing at this time. - IMPRESSION:  MRI HEAD IMPRESSION:    1. Patchy multifocal T2/FLAIR signal abnormality involving the  supratentorial and infratentorial white matter as above, consistent  with provided history of multiple sclerosis. No evidence for active  demyelination.  2. Otherwise unremarkable brain MRI for age. No other acute  intracranial abnormality.    MRA HEAD IMPRESSION:    Normal intracranial MRA. No large vessel occlusion or  hemodynamically significant stenosis.    MRI CERVICAL SPINE IMPRESSION:    1. Extensive patchy signal abnormality throughout the cervical   spinal cord, consistent with multiple sclerosis. No abnormal  enhancement to suggest active demyelination.  2. Multilevel cervical spondylosis with resultant mild diffuse  spinal stenosis at C3-4  through C5-6. Mild to moderate right C4  through C6 foraminal narrowing as above.  ------------------------- 8:03 PM: Consult with teleneurologist Dr. Elige Radon, reviewed MRI imaging, no further recommendations no indication for steroids. - Plan of care is consultation to social work team to assist with patient and family with skilled nursing facility placement as the patient's wife is now unable to take care of him at home. Discussed case with Dr. Deretha Emory who agrees with plan of care. ---- 8:05 p.m.: Patient reassessment, he is resting comfortably in bed no acute distress wife is at bedside they state understanding of care plan they are agreeable to [facility] evaluation for facility placement.  At this point in time patient's wife expressed concern for some swelling of the right side of the patient's abdomen, I evaluated the area he does have some bulging on the right side of the abdomen which appears to be positional as he is leaning towards the right side of the bed.  Patient and his wife are concerned for possible hernia, he is nontender to the abdomen and there are no overlying skin changes and that he has no nausea vomiting or diarrhea.  I had a shared decision making discussion with patient and his wife, suspicion for clinically significant incarcerated hernia is lower at this time but they are concerned for increasing swelling of that area so feel it is reasonable to obtain CT abdomen pelvis for further evaluation. - CT AP:  IMPRESSION:  1. No acute intra-abdominal or pelvic pathology.  2. Moderate colonic stool burden. No bowel obstruction. Normal  appendix.   CT imaging did show fat-containing umbilical hernia which does not correlate with patient's symptoms.  Patient without respiratory symptoms,  doubt pneumonia.  Patient updated on CT findings and states understanding.  At this time there does not appear to be any evidence of an acute emergency medical condition and the patient appears stable for [facility] facility placement.  Case was discussed with Dr. Deretha Emory, patient placed as border awaiting [facility] evaluation     Note: Portions of this report may have been transcribed using voice recognition software. Every effort was made to ensure accuracy; however, inadvertent computerized transcription errors may still be present. Final Clinical Impression(s) / ED Diagnoses Final diagnoses:  Fall, initial encounter  Multiple sclerosis [facility])    Rx / DC Orders ED Discharge Orders    None       Elizabeth Palau 10/29/20 2109    Vanetta Mulders, MD 11/03/20 [phone]

## 2020-10-29 NOTE — ED Notes (Signed)
Tele

## 2020-10-29 NOTE — ED Notes (Signed)
Warm blanket provided to patient. Wife at bedside. No complaints at this time.

## 2020-10-29 NOTE — ED Triage Notes (Signed)
Pt arrived by [facility]. Pt called out for a fall. Denies injury. Denies being on blood thinners. Pt c/o of generalized weakness that is chronic. Pt has MS.

## 2020-10-29 NOTE — TOC Initial Note (Signed)
Transition of Care [facility]) - Initial/Assessment Note    Patient Details  Name: Vincent Anthony MRN: [id number] Date of Birth: 08-17-1971  [facility]) CM/SW Contact:    Villa Herb, LCSWA Phone Number: 10/29/2020, 9:30 PM  Clinical Narrative:                 Pt presents due to fall. TOC consulted for possible SNF placement. CSW spoke with pt to complete assessment. Pt states that he lives with his wife. Pt states he has not been independent in ADLs and it has gotten to where he needs total assistance from his wife. Pts wife provides transportation when needed. Pt states he has had HHPT with [facility] in the past but they only came out one day a week. Pt has a walker, wheelchair, and a power wheelchair. CSW inquired about pts interest in SNF placement if recommended by PT, pt states he would be interested. CSW to follow and make referral based on PT recommendation. TOC to folllow.   Expected Discharge Plan: Skilled Nursing Facility Barriers to Discharge: Continued Medical Work up   Patient Goals and CMS Choice Patient states their goals for this hospitalization and ongoing recovery are:: Go to SNF for rehab. CMS Medicare.gov Compare Post Acute Care list provided to:: Patient Choice offered to / list presented to : Patient  Expected Discharge Plan and Services Expected Discharge Plan: Skilled Nursing Facility In-house Referral: Clinical Social Work Discharge Planning Services: CM Consult Post Acute Care Choice: Skilled Nursing Facility Living arrangements for the past 2 months: Single Family Home                 DME Arranged: N/A DME Agency: NA       HH Arranged: NA HH Agency: NA        Prior Living Arrangements/Services Living arrangements for the past 2 months: Single Family Home Lives with:: Spouse Patient language and need for interpreter reviewed:: Yes Do you feel safe going back to the place where you live?: Yes      Need for Family Participation in  Patient Care: Yes (Comment) Care giver support system in place?: Yes (comment) Current home services: DME Criminal Activity/Legal Involvement Pertinent to Current Situation/Hospitalization: No - Comment as needed  Activities of Daily Living      Permission Sought/Granted                  Emotional Assessment Appearance:: Appears stated age Attitude/Demeanor/Rapport: Engaged Affect (typically observed): Accepting Orientation: : Oriented to Self,Oriented to Place,Oriented to  Time,Oriented to Situation,Fluctuating Orientation (Suspected and/or reported Sundowners) Alcohol / Substance Use: Not Applicable Psych Involvement: No (comment)  Admission diagnosis:  FALL Patient Active Problem List   Diagnosis Date Noted  . MS (multiple sclerosis) (HCC) 03/08/2018  . 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 History of urinary stone 02/03/2018  . Urge incontinence of urine 02/03/2018  . Abdominal pain 01/05/2017  . Elevated LFTs 11/05/2016  . Neuropathic pain 05/23/2015  . Arthralgia of both knees 05/23/2015  . Urinary tract stones 01/03/2015  . Chronic pain syndrome 03/20/2014  . 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 Bilateral knee pain 01/29/2013  . 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 Ongoing use of possibly toxic medication 10/11/2012  . Acne 01/13/2012  . Seborrheic dermatitis 01/13/2012  . Neurogenic bladder 11/13/2011  . Urinary urgency 11/13/2011   PCP:  [name], MD Pharmacy:  No Pharmacies Listed    Social Determinants of Health (SDOH) Interventions  Readmission Risk Interventions No flowsheet data found.

## 2020-10-29 NOTE — ED Provider Notes (Signed)
°  Provider Note MRN:  [id number]  Arrival date & time: 10/29/20    ED Course and Medical Decision Making  Assumed care from Dr. Jodelle Gross at shift change.  Advanced MS, no active lesions, awaiting placement.  Signed out to default provider.  Procedures  Final Clinical Impressions(s) / ED Diagnoses     ICD-10-CM   1. Multiple sclerosis (HCC)  G35   2. Fall, initial encounter  W19.XXXA DG HIPS BILAT WITH PELVIS MIN 5 VIEWS  3. Fall  W19.Lorne Skeens DG HIPS BILAT WITH PELVIS MIN 5 VIEWS    ED Discharge Orders    None      Discharge Instructions   None     Elmer Sow. Pilar Plate, MD [facility] Emergency Medicine [facility] [email or web address]    Sabas Sous, MD 10/30/20 [phone]

## 2020-10-29 NOTE — Consult Note (Signed)
[facility] TeleNeurology Consult Services  Stat Consult  Date of Service:   10/29/2020 16:44:33  Diagnosis:       R53.1 - Weakness       G35 - Multiple sclerosis  Impression: 50 Yr old black male M Hx of MS, with right side weakness b/l lower extremity weakness and spasticity wheelchair dependent, seen by outside neuro on Ocrevus present to ed for increasing falls. Advise MRI brain and c spine w/w/o contrast to r/o new enhancing lesions, vs consider cva or other CNS pathology. He would benefit from PT. gait and strength  Our recommendations are outlined below.  Diagnostic Studies: MRI brain w/wo contrast  DVT Prophylaxis: Choice of Primary Team  Disposition: Neurology will follow  Free Text:   Additional Recommendations::    Metrics: [facility] Notification Time: 10/29/2020 16:43:22 Stamp Time: 10/29/2020 16:44:33 Callback Response Time: 10/29/2020 16:45:36   ----------------------------------------------------------------------------------------------------  Chief Complaint: weakness  History of Present Illness: Patient is a 50 year old Male.  50 Yr old black male M Hx of MS, with right side weakness b/l lower extremity weakness and spasticity wheelchair dependent, seen by outside neuro on Ocrevus present to ed for increasing falls. he was trying to get up using walker , has not had any recent MRI brain or cspine or therapy. denies headaches vision changes, incontinence   Past Medical History:      There is NO history of Hypertension      There is NO history of Diabetes Mellitus      There is NO history of Hyperlipidemia      There is NO history of Atrial Fibrillation      There is NO history of Coronary Artery Disease      There is NO history of Stroke  Anticoagulant use:  No  Antiplatelet use: No   Examination: BP(127/86), Pulse(83), Blood Glucose(98)  Neuro Exam: General: Alert,Awake, Oriented to Time, Place,  Person  Speech: Fluent:  Language: Intact:  Face: Symmetric:  Facial Sensation: Intact:  Visual Fields: Intact:  Extraocular Movements: Intact:  Motor Exam: No Drift:  Sensation: Intact:  Coordination: Intact:   Patient / Family was informed the Neurology Consult would occur via TeleHealth consult by way of interactive audio and video telecommunications and consented to receiving care in this manner.  Patient is being evaluated for possible acute neurologic impairment and high probability of imminent or life - threatening deterioration.I spent total of 26 minutes providing care to this patient, including time for face to face visit via telemedicine, review of medical records, imaging studies and discussion of findings with providers, the patient and / or family.   Dr Sampson Goon   [facility] [phone]  Case [id number]

## 2020-10-30 NOTE — Evaluation (Signed)
Physical Therapy Evaluation Patient Details Name: Vincent Anthony MRN: [id number] DOB: Jul 14, 1971 Today's Date: 10/30/2020   History of Present Illness  Vincent Anthony is a 50 y.o. male history of multiple sclerosis, GERD, arthritis, chronic pain.     Patient arrived via EMS today after a fall that occurred just prior to arrival.  Patient reports that he has been having increasing difficulty with ambulation for the past few months.  He reports due to his MS he has bilateral leg weakness as well as weakness of his upper extremities and normally ambulates with a walker.  He reports that due to the snow and ice he missed his neurologist appointment at [facility] on 09/30/2020 and has not had a follow-up appointment.  Patient reports that he had gotten up to use the bathroom today was using his walker when he felt both of his legs give out from under him, he reports that he fell while holding onto his walker towards his left side.  He denies any injury from his fall, he was eventually helped up off the floor by his wife.  He is concerned that due to his increasing weakness he is not able to perform ADLs at his baseline.  Of note patient does report he has had increasing bilateral hip pain over the past few weeks but denies any injury of those areas.    Clinical Impression  Patient requires Max assist to don RLE AFO, has difficulty rolling to side and sitting up at bedside due to right sided weakness and poor grip strength with right hand, at high risk for falls and limited to partial standing at bedside using RW, demonstrates good return for scooting laterally at bedside and put back to bed after therapy.  Patient will benefit from continued physical therapy in hospital and recommended venue below to increase strength, balance, endurance for safe ADLs and gait.    Follow Up Recommendations SNF;Supervision for mobility/OOB;Supervision - Intermittent    Equipment Recommendations  None recommended by PT     Recommendations for Other Services       Precautions / Restrictions Precautions Precautions: Fall Required Braces or Orthoses: Other Brace Other Brace: AFO RLE Restrictions Weight Bearing Restrictions: No      Mobility  Bed Mobility Overal bed mobility: Needs Assistance Bed Mobility: Supine to Sit;Sit to Supine     Supine to sit: Mod assist;Max assist Sit to supine: Mod assist   General bed mobility comments: increased time, labored movement with difficulty moving RLE    Transfers Overall transfer level: Needs assistance Equipment used: Rolling walker (2 wheeled);1 person hand held assist Transfers: Sit to/from Stand Sit to Stand: Max assist         General transfer comment: unable to lock knees due to BLE weakness, poor standing balance  Ambulation/Gait                Stairs            Wheelchair Mobility    Modified Rankin (Stroke Patients Only)       Balance Overall balance assessment: Needs assistance Sitting-balance support: Feet supported;No upper extremity supported Sitting balance-Leahy Scale: Fair Sitting balance - Comments: seated at EOB   Standing balance support: During functional activity;Bilateral upper extremity supported Standing balance-Leahy Scale: Poor Standing balance comment: using RW                             Pertinent Vitals/Pain Pain Assessment:  No/denies pain    Home Living Family/patient expects to be discharged to:: Private residence Living Arrangements: Spouse/significant other Available Help at Discharge: Family;Available PRN/intermittently Type of Home: House Home Access: Ramped entrance     Home Layout: One level        Prior Function Level of Independence: Needs assistance   Gait / Transfers Assistance Needed: uses manual or power w/c mostly, was able to take steps with RW up to 09/21, but has not walked since then due to fall  ADL's / Homemaking Assistance Needed: assisted by  family        Hand Dominance   Dominant Hand: Right    Extremity/Trunk Assessment   Upper Extremity Assessment Upper Extremity Assessment: RUE deficits/detail;LUE deficits/detail RUE Deficits / Details: grossly -3/5, mild contractures right hand, has difficulty gripping RUE Sensation: WNL RUE Coordination: decreased fine motor;decreased gross motor LUE Deficits / Details: grossly 4+/5 LUE Sensation: WNL LUE Coordination: WNL    Lower Extremity Assessment Lower Extremity Assessment: RLE deficits/detail;LLE deficits/detail RLE Deficits / Details: grossly 2+/5, ankle dorsiflexion 0/5 RLE Sensation: WNL RLE Coordination: decreased fine motor;decreased gross motor LLE Deficits / Details: grossly-4/5 LLE Sensation: WNL LLE Coordination: WNL    Cervical / Trunk Assessment Cervical / Trunk Assessment: Normal  Communication   Communication: No difficulties  Cognition Arousal/Alertness: Awake/alert Behavior During Therapy: WFL for tasks assessed/performed Overall Cognitive Status: Within Functional Limits for tasks assessed                                        General Comments      Exercises     Assessment/Plan    PT Assessment Patient needs continued PT services  PT Problem List Decreased strength;Decreased activity tolerance;Decreased balance;Decreased mobility;Decreased knowledge of use of DME       PT Treatment Interventions DME instruction;Functional mobility training;Therapeutic activities;Therapeutic exercise;Balance training;Neuromuscular re-education;Patient/family education;Wheelchair mobility training    PT Goals (Current goals can be found in the Care Plan section)  Acute Rehab PT Goals Patient Stated Goal: return home after rehab PT Goal Formulation: With patient Time For Goal Achievement: 11/13/20 Potential to Achieve Goals: Good    Frequency Min 2X/week   Barriers to discharge        Co-evaluation                AM-PAC PT "6 Clicks" Mobility  Outcome Measure Help needed turning from your back to your side while in a flat bed without using bedrails?: A Lot Help needed moving from lying on your back to sitting on the side of a flat bed without using bedrails?: A Lot Help needed moving to and from a bed to a chair (including a wheelchair)?: A Lot Help needed standing up from a chair using your arms (e.g., wheelchair or bedside chair)?: A Lot Help needed to walk in hospital room?: Total Help needed climbing 3-5 steps with a railing? : Total 6 Click Score: 10    End of Session   Activity Tolerance: Patient tolerated treatment well;Patient limited by fatigue Patient left: in bed;with call bell/phone within reach Nurse Communication: Mobility status PT Visit Diagnosis: Unsteadiness on feet (R26.81);Other abnormalities of gait and mobility (R26.89);Muscle weakness (generalized) (M62.81)    Time: 7741-2878 PT Time Calculation (min) (ACUTE ONLY): 32 min   Charges:   PT Evaluation $PT Eval Moderate Complexity: 1 Mod PT Treatments $Therapeutic Activity: 23-37 mins  10:07 AM, 10/30/20 Ocie Bob, MPT Physical Therapist with [facility] 336 [phone] office [phone] mobile phone

## 2020-10-30 NOTE — ED Provider Notes (Signed)
Emergency Medicine Observation Re-evaluation Note  Vincent Anthony is a 50 y.o. male, seen on rounds today.  Pt initially presented to the ED for complaints of Fall Currently, the patient is resting comfortably.  Physical Exam  BP 100/69   Pulse 81   Temp 97.7 F (36.5 C) (Oral)   Resp 14   Ht 6\' 3"  (1.905 m)   Wt 99.8 kg   SpO2 97%   BMI 27.50 kg/m  Physical Exam General: No acute distress Cardiac: Regular rate Lungs: No respiratory distress Psych: No psychosis  ED Course / MDM  EKG:  Clinical Course as of 10/30/20 1406  Tue Oct 29, 2020  1646 Teleneuro [BM]  1757 MRI brain/Cspine w and w/o contrast - Dr. Oct 31, 2020 [BM]  2002 Dr. 2003 [BM]    Clinical Course User Index [BM] Elige Radon, PA-C   I have reviewed the labs performed to date as well as medications administered while in observation.  Recent changes in the last 24 hours include : None.  Plan  Current plan is for patient to get PT evaluation and likely placed. Patient is not under full IVC at this time.  I reviewed patient's MRI from yesterday.  He had MRI of the cervical spine, brain and it appears that he has evidence of extensive MS without any acute changes. Primary team had requested social work consult for placement given patient's inability to ADLs.  Overnight there were no issues.  I have signed the FL 2 form at the request of social work.  Physical therapy recommendation are as following:  "Patient requires Max assist to don RLE AFO, has difficulty rolling to side and sitting up at bedside due to right sided weakness and poor grip strength with right hand, at high risk for falls and limited to partial standing at bedside using RW, demonstrates good return for scooting laterally at bedside and put back to bed after therapy.  Patient will benefit from continued physical therapy in hospital and recommended venue below to increase strength, balance, endurance for safe ADLs and gait."   Bill Salinas, MD 10/30/20 1409

## 2020-10-30 NOTE — [place] FL2 (Signed)
[place] MEDICAID FL2 LEVEL OF CARE SCREENING TOOL     IDENTIFICATION  Patient Name: Vincent Anthony Birthdate: 28-Oct-1970 Sex: male Admission Date (Current Location): 10/29/2020  [facility] and [place] Number:  [employer] and Address:  [facility],  [address] [place] [address]      Provider Number: [id number]  Attending Physician Name and Address:  Default, Provider, MD  Relative Name and Phone Number:  Korvin Valentine (wife) Ph: [phone]    Current Level of Care: Hospital Recommended Level of Care: Skilled Nursing Facility Prior Approval Number:    Date Approved/Denied:   PASRR Number: [id number]  Discharge Plan: SNF    Current Diagnoses: Patient Active Problem List   Diagnosis Date Noted  . MS (multiple sclerosis) (HCC) 03/08/2018  . 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 History of urinary stone 02/03/2018  . Urge incontinence of urine 02/03/2018  . Abdominal pain 01/05/2017  . Elevated LFTs 11/05/2016  . Neuropathic pain 05/23/2015  . Arthralgia of both knees 05/23/2015  . Urinary tract stones 01/03/2015  . Chronic pain syndrome 03/20/2014  . 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 Bilateral knee pain 01/29/2013  . 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 Ongoing use of possibly toxic medication 10/11/2012  . Acne 01/13/2012  . Seborrheic dermatitis 01/13/2012  . Neurogenic bladder 11/13/2011  . Urinary urgency 11/13/2011    Orientation RESPIRATION BLADDER Height & Weight     Self,Time,Situation,Place  Normal Continent (Patient usually has urinal at beside due to MS) Weight: 220 lb 0.3 oz (99.8 kg) Height:  6\' 3"  (190.5 cm)  BEHAVIORAL SYMPTOMS/MOOD NEUROLOGICAL BOWEL NUTRITION STATUS      Continent Diet (Regular)  AMBULATORY STATUS COMMUNICATION OF NEEDS Skin   Total Care Verbally Normal                       Personal Care Assistance Level of Assistance  Bathing,Feeding,Dressing Bathing Assistance:  Maximum assistance Feeding assistance: Independent Dressing Assistance: Maximum assistance     Functional Limitations Info  Sight,Hearing,Speech Sight Info: Impaired Hearing Info: Adequate Speech Info: Adequate    SPECIAL CARE FACTORS FREQUENCY  PT (By licensed PT)     PT Frequency: 5x's/week (Patient needs transfer training with sliding board)              Contractures Contractures Info: Present (Mild contractures in right hand; has difficulty gripping)    Additional Factors Info  Allergies,Code Status Code Status Info: Full Allergies Info: Celebrex (Celecoxib); Vioxx (Rofecoxib); Zanaflex (Tizanidine Hcl); Clindamycin           Current Medications (10/30/2020):  This is the current hospital active medication list No current facility-administered medications for this encounter.   Current Outpatient Medications  Medication Sig Dispense Refill  . amitriptyline (ELAVIL) 10 MG tablet Take 1 tablet by mouth at bedtime.     . baclofen (LIORESAL) 10 MG tablet Take 1 tablet by mouth 3 (three) times daily.    . cetirizine (ZYRTEC) 10 MG tablet Take 10 mg by mouth daily.    . Cholecalciferol (VITAMIN D3) 50 MCG (2000 UT) TABS Take 2,000 Units by mouth in the morning.    . clonazePAM (KLONOPIN) 1 MG tablet Take 1 tablet by mouth 2 (two) times daily. Or as needed    . cyclobenzaprine (FLEXERIL) 5 MG tablet Take 5 mg by mouth 3 (three) times daily as needed.    . fluticasone (FLONASE) 50 MCG/ACT nasal spray Place 2 sprays into both nostrils daily  as needed for allergies or rhinitis.    Marland Kitchen GNP FIBER-CAPS 625 MG tablet Take 625 mg by mouth daily.   2  . hyoscyamine (LEVBID) 0.375 MG 12 hr tablet TAKE (1) TABLET BYMOUTH EVERY TWELVE HOURS. 60 tablet 11  . ibuprofen (ADVIL) 800 MG tablet Take 800 mg by mouth every 8 (eight) hours as needed for mild pain or moderate pain.    Marland Kitchen levETIRAcetam (KEPPRA) 250 MG tablet Take 2 tablets by mouth 2 (two) times daily.     . metFORMIN (GLUCOPHAGE)  500 MG tablet Take 500 mg by mouth 2 (two) times daily.    [employer] (OCREVUS IV) Inject into the vein. Every 6 months for MS    . pravastatin (PRAVACHOL) 20 MG tablet Take 20 mg by mouth every morning.     . pregabalin (LYRICA) 75 MG capsule 1 in the evening and 2 at bedtime    . VESICARE 10 MG tablet Take 1 tablet by mouth every morning.     . nitrofurantoin, macrocrystal-monohydrate, (MACROBID) 100 MG capsule Take 1 capsule (100 mg total) by mouth 2 (two) times daily. (Patient not taking: No sig reported) 10 capsule 0     Discharge Medications: Please see discharge summary for a list of discharge medications.  Relevant Imaging Results:  Relevant Lab Results:   Additional Information SSN: [id number]  Ewing Schlein, LCSW

## 2020-10-30 NOTE — TOC Progression Note (Addendum)
Transition of Care [facility]) - Progression Note   Patient Details  Name: Vincent Anthony MRN: [id number] Date of Birth: 12/14/1970  Transition of Care [facility]) CM/SW Contact  Ewing Schlein, LCSW Phone Number: 10/30/2020, 11:01 AM  Clinical Narrative: PT evaluation recommended SNF. CSW spoke with patient to discuss recommendations and obtain collateral information to complete the FL2. Patient agreeable to local SNF referrals. FL2 completed; PASRR received. Initial referrals faxed out. CSW called [facility] to start insurance authorization. Reference ID # is: [id number]. Clinicals faxed to [facility]. TOC awaiting bed offers.  Addendum: Patient's only bed offer is [facility]. CSW spoke with patient and he is agreeable to the facility. CSW spoke with Eunice Blase at [facility] and confirmed patient can discharge to the facility tomorrow. CSW called [facility] and provided the facility. [employer] to contact TOC once the authorization is complete. Patient will need COVID test prior to admission tomorrow.  Expected Discharge Plan: Skilled Nursing Facility Barriers to Discharge: Continued Medical Work up  Expected Discharge Plan and Services Expected Discharge Plan: Skilled Nursing Facility In-house Referral: Clinical Social Work Discharge Planning Services: CM Consult Post Acute Care Choice: Skilled Nursing Facility Living arrangements for the past 2 months: Single Family Home             DME Arranged: N/A DME Agency: NA HH Arranged: NA HH Agency: NA  Readmission Risk Interventions No flowsheet data found.

## 2020-10-30 NOTE — Plan of Care (Signed)
  Problem: Acute Rehab PT Goals(only PT should resolve) Goal: Pt will Roll Supine to Side Outcome: Progressing Flowsheets (Taken 10/30/2020 1010) Pt will Roll Supine to Side: with min assist Goal: Pt Will Go Supine/Side To Sit Outcome: Progressing Flowsheets (Taken 10/30/2020 1010) Pt will go Supine/Side to Sit: with minimal assist Goal: Pt Will Go Sit To Supine/Side Outcome: Progressing Flowsheets (Taken 10/30/2020 1010) Pt will go Sit to Supine/Side:  with minimal assist  with moderate assist Goal: Patient Will Perform Sitting Balance Outcome: Progressing Flowsheets (Taken 10/30/2020 1010) Patient will perform sitting balance: with modified independence Goal: Pt Will Transfer Bed To Chair/Chair To Bed Outcome: Progressing Flowsheets (Taken 10/30/2020 1010) Pt will Transfer Bed to Chair/Chair to Bed: with min assist   10:10 AM, 10/30/20 Ocie Bob, MPT Physical Therapist with [facility] [phone] office [phone] mobile phone

## 2020-10-31 LAB — SARS CORONAVIRUS 2 (TAT 6-24 HRS): SARS Coronavirus 2: NEGATIVE

## 2020-10-31 MED ORDER — CLONAZEPAM 1 MG PO TABS: 1.0000 mg | ORAL_TABLET | Freq: Two times a day (BID) | ORAL | 0 refills | Status: DC

## 2020-10-31 NOTE — ED Provider Notes (Signed)
Emergency Medicine Observation Re-evaluation Note  Vincent Anthony is a 50 y.o. male, seen on rounds today.  Pt initially presented to the ED for complaints of Fall Currently, the patient is resting comfortably in bed.  Physical Exam  BP 121/83   Pulse 67   Temp 97.7 F (36.5 C) (Oral)   Resp (!) 21   Ht 6\' 3"  (1.905 m)   Wt 99.8 kg   SpO2 98%   BMI 27.50 kg/m  Physical Exam General: No acute distress Cardiac: Regular rate and rhythm Lungs: Clear to auscultation Psych: Cooperative  ED Course / MDM  EKG:  Clinical Course as of 10/31/20 0835  Tue Oct 29, 2020  1646 Teleneuro [BM]  1757 MRI brain/Cspine w and w/o contrast - Dr. Oct 31, 2020 [BM]  2002 Dr. 2003 [BM]    Clinical Course User Index [BM] Elige Radon, PA-C   I have reviewed the labs performed to date as well as medications administered while in observation.  Recent changes in the last 24 hours include physical therapy evaluation and social work.  Plan  Current plan is for SNF placement. Patient is not under full IVC at this time.   Bill Salinas, MD 10/31/20 [phone]

## 2020-10-31 NOTE — ED Notes (Signed)
ED TO INPATIENT HANDOFF REPORT  ED Nurse Name and Phone #:   S Name/Age/Gender Vincent Anthony 50 y.o. male Room/Bed: APA17/APA17  Code Status   Code Status: Not on file  Home/SNF/Other Home Patient oriented to: self, place, time and situation Is this baseline? Yes   Triage Complete: Triage complete  Chief Complaint FALL  Triage Note Pt arrived by [facility]. Pt called out for a fall. Denies injury. Denies being on blood thinners. Pt c/o of generalized weakness that is chronic. Pt has MS.     Allergies Allergies  Allergen Reactions  . Celebrex [Celecoxib] Nausea And Vomiting  . Vioxx [Rofecoxib] Nausea And Vomiting  . Zanaflex [Tizanidine Hcl] Nausea And Vomiting  . Clindamycin Diarrhea    Level of Care/Admitting Diagnosis ED Disposition    None      B Medical/Surgery History Past Medical History:  Diagnosis Date  . Arthritis   . Elevated LFTs   . Elevated liver enzymes   . GERD (gastroesophageal reflux disease)   . Headache   . MS (multiple sclerosis) (HCC)   . Tremor    Past Surgical History:  Procedure Laterality Date  . COLONOSCOPY     2-4 years ago  . HERNIA REPAIR    . KNEE SURGERY    . TESTICLE REMOVAL       A IV Location/Drains/Wounds Patient Lines/Drains/Airways Status    Active Line/Drains/Airways    Name Placement date Placement time Site Days   Peripheral IV 10/29/20 Left Antecubital 10/29/20  2024  Antecubital  2          Intake/Output Last 24 hours No intake or output data in the 24 hours ending 10/31/20 1400  Labs/Imaging Results for orders placed or performed during the hospital encounter of 10/29/20 (from the past 48 hour(s))  Urinalysis, Routine w reflex microscopic     Status: None   Collection Time: 10/29/20  2:20 PM  Result Value Ref Range   Color, Urine YELLOW YELLOW   APPearance CLEAR CLEAR   Specific Gravity, Urine 1.013 1.005 - 1.030   pH 6.0 5.0 - 8.0   Glucose, UA NEGATIVE NEGATIVE mg/dL   Hgb urine  dipstick NEGATIVE NEGATIVE   Bilirubin Urine NEGATIVE NEGATIVE   Ketones, ur NEGATIVE NEGATIVE mg/dL   Protein, ur NEGATIVE NEGATIVE mg/dL   Nitrite NEGATIVE NEGATIVE   Leukocytes,Ua NEGATIVE NEGATIVE    Comment: Performed at [facility], [address], [place], [place] [address]  CBC with Differential     Status: Abnormal   Collection Time: 10/29/20  2:35 PM  Result Value Ref Range   WBC 10.2 4.0 - 10.5 K/uL   RBC 5.30 4.22 - 5.81 MIL/uL   Hemoglobin 14.4 13.0 - 17.0 g/dL   HCT 60.4 54.0 - 98.1 %   MCV 85.5 80.0 - 100.0 fL   MCH 27.2 26.0 - 34.0 pg   MCHC 31.8 30.0 - 36.0 g/dL   RDW 19.1 47.8 - 29.5 %   Platelets 268 150 - 400 K/uL   nRBC 0.0 0.0 - 0.2 %   Neutrophils Relative % 91 %   Neutro Abs 9.3 (H) 1.7 - 7.7 K/uL   Lymphocytes Relative 4 %   Lymphs Abs 0.4 (L) 0.7 - 4.0 K/uL   Monocytes Relative 5 %   Monocytes Absolute 0.5 0.1 - 1.0 K/uL   Eosinophils Relative 0 %   Eosinophils Absolute 0.0 0.0 - 0.5 K/uL   Basophils Relative 0 %   Basophils Absolute 0.0 0.0 -  0.1 K/uL   Immature Granulocytes 0 %   Abs Immature Granulocytes 0.03 0.00 - 0.07 K/uL    Comment: Performed at [facility], [address], [place], [place] [address]  Basic metabolic panel     Status: None   Collection Time: 10/29/20  2:35 PM  Result Value Ref Range   Sodium 138 135 - 145 mmol/L   Potassium 4.3 3.5 - 5.1 mmol/L   Chloride 101 98 - 111 mmol/L   CO2 26 22 - 32 mmol/L   Glucose, Bld 98 70 - 99 mg/dL    Comment: Glucose reference range applies only to samples taken after fasting for at least 8 hours.   BUN 19 6 - 20 mg/dL   Creatinine, Ser 1.91 0.61 - 1.24 mg/dL   Calcium 47.8 8.9 - 29.5 mg/dL   GFR, Estimated >62 >13 mL/min    Comment: (NOTE) Calculated using the CKD-EPI Creatinine Equation (2021)    Anion gap 11 5 - 15    Comment: Performed at [facility], [address], [place], [place] [address]  SARS CORONAVIRUS 2 (TAT 6-24 HRS) Nasopharyngeal Nasopharyngeal Swab     Status: None    Collection Time: 10/30/20  5:18 PM   Specimen: Nasopharyngeal Swab  Result Value Ref Range   SARS Coronavirus 2 NEGATIVE NEGATIVE    Comment: (NOTE) SARS-CoV-2 target nucleic acids are NOT DETECTED.  The SARS-CoV-2 RNA is generally detectable in upper and lower respiratory specimens during the acute phase of infection. Negative results do not preclude SARS-CoV-2 infection, do not rule out co-infections with other pathogens, and should not be used as the sole basis for treatment or other patient management decisions. Negative results must be combined with clinical observations, patient history, and epidemiological information. The expected result is Negative.  Fact Sheet for Patients: HairSlick.no  Fact Sheet for Healthcare Providers: quierodirigir.com  This test is not yet approved or cleared by the [place] FDA and  has been authorized for detection and/or diagnosis of SARS-CoV-2 by FDA under an Emergency Use Authorization (EUA). This EUA will remain  in effect (meaning this test can be used) for the duration of the COVID-19 declaration under Se ction 564(b)(1) of the Act, 21 U.S.C. section 360bbb-3(b)(1), unless the authorization is terminated or revoked sooner.  Performed at [facility], [address] [place], [place] [address]    MR ANGIO HEAD WO CONTRAST  Result Date: 10/29/2020 EXAM: MRI HEAD WITHOUT AND WITH CONTRAST MRI CERVICAL SPINE WITHOUT AND WITH CONTRAST MRA HEAD WITHOUT CONTRAST TECHNIQUE: Multiplanar, multiecho pulse sequences of the brain and surrounding structures, and cervical spine, to include the craniocervical junction and cervicothoracic junction, were obtained without and with intravenous contrast. Additional angiographic images of the intracranial circulation and circle-of-Willis were performed without the administration of IV contrast. CONTRAST:  10mL GADAVIST GADOBUTROL 1 MMOL/ML IV  SOLN COMPARISON:  None. FINDINGS: MRI HEAD FINDINGS Brain: Cerebral volume within normal limits for age. Patchy multifocal foci of T2/FLAIR hyperintensity seen involving the periventricular, deep, in juxta cortical white matter of both cerebral hemispheres. Several of these foci radiated in a perpendicular fashion from the lateral ventricles. Callososeptal involvement. Patchy infratentorial involvement noted about the fourth ventricle and right pons. Multiple scattered corresponding T1 black holes. Findings consistent with provided history of multiple sclerosis. No associated restricted diffusion or enhancement to suggest active demyelination. No evidence for acute or subacute infarct. Gray-white matter differentiation otherwise maintained. No encephalomalacia to suggest chronic cortical infarction. No evidence for acute or  chronic intracranial hemorrhage. No mass lesion, midline shift or mass effect. No hydrocephalus or extra-axial fluid collection. Pituitary gland suprasellar region within normal limits. Midline structures intact. No other abnormal enhancement. Vascular: Major intracranial vascular flow voids are maintained. Skull and upper cervical spine: Craniocervical junction within normal limits. Bone marrow signal intensity normal. No scalp soft tissue abnormality. Sinuses/Orbits: Globes and orbital soft tissues within normal limits. Right maxillary sinus retention cyst noted. Paranasal sinuses are otherwise clear. No mastoid effusion. Inner ear structures grossly normal. Other: None. MRA HEAD FINDINGS ANTERIOR CIRCULATION: Examination mildly degraded by motion artifact. Visualized distal cervical segments of the internal carotid arteries are patent with antegrade flow. Petrous, cavernous, and supraclinoid segments patent without stenosis or other abnormality. A1 segments widely patent. Normal anterior communicating artery complex. Anterior cerebral arteries patent to their distal aspects without stenosis.  No M1 stenosis or occlusion. Normal MCA bifurcations. Distal MCA branches well perfused and symmetric. POSTERIOR CIRCULATION: Both V4 segments patent to the vertebrobasilar junction without stenosis. Right vertebral dominant. Partially visualized left PICA patent. Right PICA not seen. Basilar patent to its distal aspect without stenosis. Superior cerebellar arteries patent bilaterally. Both PCA supplied via the basilar as well as prominent bilateral posterior communicating arteries. PCAs grossly perfused to their distal aspects without appreciable stenosis. No intracranial aneurysm. MRI CERVICAL SPINE FINDINGS Alignment: Straightening of the normal cervical lordosis. No listhesis. Vertebrae: Vertebral body height maintained without acute or chronic fracture. Bone marrow signal intensity somewhat diffusely heterogeneous but within normal limits. No discrete or worrisome osseous lesions. No abnormal marrow edema or enhancement. Cord: Extensive patchy signal abnormality seen throughout the cervical spinal cord, extending from C1-2 through C6-7, with involvement of nearly all levels. Overall, involvement is slightly greater within the right hemi cord as compared to the left. Underlying cord atrophy. Finding consistent with multiple sclerosis. No abnormal enhancement to suggest active demyelination. Posterior Fossa, vertebral arteries, paraspinal tissues: Paraspinous and prevertebral soft tissues within normal limits. Normal flow voids seen within the vertebral arteries bilaterally. Disc levels: C2-C3: Small central disc protrusion indents the ventral thecal sac. Associated annular fissure. No significant spinal stenosis. Mild left greater than right uncovertebral hypertrophy without significant foraminal encroachment. C3-C4: Broad-based left paracentral disc osteophyte complex indents the left ventral thecal sac (series 9, image 9). Resultant mild spinal stenosis with mild flattening of the left hemi cord. Right worse  than left uncovertebral hypertrophy with resultant moderate right C4 foraminal stenosis. C4-C5: Mild disc bulge with uncovertebral hypertrophy. Superimposed central disc protrusion indents the ventral thecal sac, contacting and mildly flattening the ventral cord. Resultant mild spinal stenosis. Right worse than left uncovertebral hypertrophy with resultant mild to moderate right C5 foraminal stenosis. Left neural foramina remains patent. C5-C6: Diffuse disc bulge with bilateral uncovertebral hypertrophy. Superimposed central disc protrusion indents the ventral thecal sac, slightly asymmetric to the right (series 9, image 19). Mild flattening of the right hemi cord with mild spinal stenosis. Mild right C6 foraminal narrowing. C6-C7: Mild disc bulge with right greater than left uncovertebral hypertrophy. No significant spinal stenosis. Mild right C7 foraminal narrowing. C7-T1:  Unremarkable. IMPRESSION: MRI HEAD IMPRESSION: 1. Patchy multifocal T2/FLAIR signal abnormality involving the supratentorial and infratentorial white matter as above, consistent with provided history of multiple sclerosis. No evidence for active demyelination. 2. Otherwise unremarkable brain MRI for age. No other acute intracranial abnormality. MRA HEAD IMPRESSION: Normal intracranial MRA. No large vessel occlusion or hemodynamically significant stenosis. MRI CERVICAL SPINE IMPRESSION: 1. Extensive patchy signal abnormality throughout the cervical spinal  cord, consistent with multiple sclerosis. No abnormal enhancement to suggest active demyelination. 2. Multilevel cervical spondylosis with resultant mild diffuse spinal stenosis at C3-4 through C5-6. Mild to moderate right C4 through C6 foraminal narrowing as above. Electronically Signed   By: Rise Mu M.D.   On: 10/29/2020 19:33   MR Brain W and Wo Contrast  Result Date: 10/29/2020 EXAM: MRI HEAD WITHOUT AND WITH CONTRAST MRI CERVICAL SPINE WITHOUT AND WITH CONTRAST MRA HEAD  WITHOUT CONTRAST TECHNIQUE: Multiplanar, multiecho pulse sequences of the brain and surrounding structures, and cervical spine, to include the craniocervical junction and cervicothoracic junction, were obtained without and with intravenous contrast. Additional angiographic images of the intracranial circulation and circle-of-Willis were performed without the administration of IV contrast. CONTRAST:  69mL GADAVIST GADOBUTROL 1 MMOL/ML IV SOLN COMPARISON:  None. FINDINGS: MRI HEAD FINDINGS Brain: Cerebral volume within normal limits for age. Patchy multifocal foci of T2/FLAIR hyperintensity seen involving the periventricular, deep, in juxta cortical white matter of both cerebral hemispheres. Several of these foci radiated in a perpendicular fashion from the lateral ventricles. Callososeptal involvement. Patchy infratentorial involvement noted about the fourth ventricle and right pons. Multiple scattered corresponding T1 black holes. Findings consistent with provided history of multiple sclerosis. No associated restricted diffusion or enhancement to suggest active demyelination. No evidence for acute or subacute infarct. Gray-white matter differentiation otherwise maintained. No encephalomalacia to suggest chronic cortical infarction. No evidence for acute or chronic intracranial hemorrhage. No mass lesion, midline shift or mass effect. No hydrocephalus or extra-axial fluid collection. Pituitary gland suprasellar region within normal limits. Midline structures intact. No other abnormal enhancement. Vascular: Major intracranial vascular flow voids are maintained. Skull and upper cervical spine: Craniocervical junction within normal limits. Bone marrow signal intensity normal. No scalp soft tissue abnormality. Sinuses/Orbits: Globes and orbital soft tissues within normal limits. Right maxillary sinus retention cyst noted. Paranasal sinuses are otherwise clear. No mastoid effusion. Inner ear structures grossly normal.  Other: None. MRA HEAD FINDINGS ANTERIOR CIRCULATION: Examination mildly degraded by motion artifact. Visualized distal cervical segments of the internal carotid arteries are patent with antegrade flow. Petrous, cavernous, and supraclinoid segments patent without stenosis or other abnormality. A1 segments widely patent. Normal anterior communicating artery complex. Anterior cerebral arteries patent to their distal aspects without stenosis. No M1 stenosis or occlusion. Normal MCA bifurcations. Distal MCA branches well perfused and symmetric. POSTERIOR CIRCULATION: Both V4 segments patent to the vertebrobasilar junction without stenosis. Right vertebral dominant. Partially visualized left PICA patent. Right PICA not seen. Basilar patent to its distal aspect without stenosis. Superior cerebellar arteries patent bilaterally. Both PCA supplied via the basilar as well as prominent bilateral posterior communicating arteries. PCAs grossly perfused to their distal aspects without appreciable stenosis. No intracranial aneurysm. MRI CERVICAL SPINE FINDINGS Alignment: Straightening of the normal cervical lordosis. No listhesis. Vertebrae: Vertebral body height maintained without acute or chronic fracture. Bone marrow signal intensity somewhat diffusely heterogeneous but within normal limits. No discrete or worrisome osseous lesions. No abnormal marrow edema or enhancement. Cord: Extensive patchy signal abnormality seen throughout the cervical spinal cord, extending from C1-2 through C6-7, with involvement of nearly all levels. Overall, involvement is slightly greater within the right hemi cord as compared to the left. Underlying cord atrophy. Finding consistent with multiple sclerosis. No abnormal enhancement to suggest active demyelination. Posterior Fossa, vertebral arteries, paraspinal tissues: Paraspinous and prevertebral soft tissues within normal limits. Normal flow voids seen within the vertebral arteries bilaterally.  Disc levels: C2-C3: Small central disc protrusion indents  the ventral thecal sac. Associated annular fissure. No significant spinal stenosis. Mild left greater than right uncovertebral hypertrophy without significant foraminal encroachment. C3-C4: Broad-based left paracentral disc osteophyte complex indents the left ventral thecal sac (series 9, image 9). Resultant mild spinal stenosis with mild flattening of the left hemi cord. Right worse than left uncovertebral hypertrophy with resultant moderate right C4 foraminal stenosis. C4-C5: Mild disc bulge with uncovertebral hypertrophy. Superimposed central disc protrusion indents the ventral thecal sac, contacting and mildly flattening the ventral cord. Resultant mild spinal stenosis. Right worse than left uncovertebral hypertrophy with resultant mild to moderate right C5 foraminal stenosis. Left neural foramina remains patent. C5-C6: Diffuse disc bulge with bilateral uncovertebral hypertrophy. Superimposed central disc protrusion indents the ventral thecal sac, slightly asymmetric to the right (series 9, image 19). Mild flattening of the right hemi cord with mild spinal stenosis. Mild right C6 foraminal narrowing. C6-C7: Mild disc bulge with right greater than left uncovertebral hypertrophy. No significant spinal stenosis. Mild right C7 foraminal narrowing. C7-T1:  Unremarkable. IMPRESSION: MRI HEAD IMPRESSION: 1. Patchy multifocal T2/FLAIR signal abnormality involving the supratentorial and infratentorial white matter as above, consistent with provided history of multiple sclerosis. No evidence for active demyelination. 2. Otherwise unremarkable brain MRI for age. No other acute intracranial abnormality. MRA HEAD IMPRESSION: Normal intracranial MRA. No large vessel occlusion or hemodynamically significant stenosis. MRI CERVICAL SPINE IMPRESSION: 1. Extensive patchy signal abnormality throughout the cervical spinal cord, consistent with multiple sclerosis. No abnormal  enhancement to suggest active demyelination. 2. Multilevel cervical spondylosis with resultant mild diffuse spinal stenosis at C3-4 through C5-6. Mild to moderate right C4 through C6 foraminal narrowing as above. Electronically Signed   By: Rise MuBenjamin  McClintock M.D.   On: 10/29/2020 19:33   MR Cervical Spine W or Wo Contrast  Result Date: 10/29/2020 EXAM: MRI HEAD WITHOUT AND WITH CONTRAST MRI CERVICAL SPINE WITHOUT AND WITH CONTRAST MRA HEAD WITHOUT CONTRAST TECHNIQUE: Multiplanar, multiecho pulse sequences of the brain and surrounding structures, and cervical spine, to include the craniocervical junction and cervicothoracic junction, were obtained without and with intravenous contrast. Additional angiographic images of the intracranial circulation and circle-of-Willis were performed without the administration of IV contrast. CONTRAST:  10mL GADAVIST GADOBUTROL 1 MMOL/ML IV SOLN COMPARISON:  None. FINDINGS: MRI HEAD FINDINGS Brain: Cerebral volume within normal limits for age. Patchy multifocal foci of T2/FLAIR hyperintensity seen involving the periventricular, deep, in juxta cortical white matter of both cerebral hemispheres. Several of these foci radiated in a perpendicular fashion from the lateral ventricles. Callososeptal involvement. Patchy infratentorial involvement noted about the fourth ventricle and right pons. Multiple scattered corresponding T1 black holes. Findings consistent with provided history of multiple sclerosis. No associated restricted diffusion or enhancement to suggest active demyelination. No evidence for acute or subacute infarct. Gray-white matter differentiation otherwise maintained. No encephalomalacia to suggest chronic cortical infarction. No evidence for acute or chronic intracranial hemorrhage. No mass lesion, midline shift or mass effect. No hydrocephalus or extra-axial fluid collection. Pituitary gland suprasellar region within normal limits. Midline structures intact. No other  abnormal enhancement. Vascular: Major intracranial vascular flow voids are maintained. Skull and upper cervical spine: Craniocervical junction within normal limits. Bone marrow signal intensity normal. No scalp soft tissue abnormality. Sinuses/Orbits: Globes and orbital soft tissues within normal limits. Right maxillary sinus retention cyst noted. Paranasal sinuses are otherwise clear. No mastoid effusion. Inner ear structures grossly normal. Other: None. MRA HEAD FINDINGS ANTERIOR CIRCULATION: Examination mildly degraded by motion artifact. Visualized distal cervical segments of the  internal carotid arteries are patent with antegrade flow. Petrous, cavernous, and supraclinoid segments patent without stenosis or other abnormality. A1 segments widely patent. Normal anterior communicating artery complex. Anterior cerebral arteries patent to their distal aspects without stenosis. No M1 stenosis or occlusion. Normal MCA bifurcations. Distal MCA branches well perfused and symmetric. POSTERIOR CIRCULATION: Both V4 segments patent to the vertebrobasilar junction without stenosis. Right vertebral dominant. Partially visualized left PICA patent. Right PICA not seen. Basilar patent to its distal aspect without stenosis. Superior cerebellar arteries patent bilaterally. Both PCA supplied via the basilar as well as prominent bilateral posterior communicating arteries. PCAs grossly perfused to their distal aspects without appreciable stenosis. No intracranial aneurysm. MRI CERVICAL SPINE FINDINGS Alignment: Straightening of the normal cervical lordosis. No listhesis. Vertebrae: Vertebral body height maintained without acute or chronic fracture. Bone marrow signal intensity somewhat diffusely heterogeneous but within normal limits. No discrete or worrisome osseous lesions. No abnormal marrow edema or enhancement. Cord: Extensive patchy signal abnormality seen throughout the cervical spinal cord, extending from C1-2 through C6-7,  with involvement of nearly all levels. Overall, involvement is slightly greater within the right hemi cord as compared to the left. Underlying cord atrophy. Finding consistent with multiple sclerosis. No abnormal enhancement to suggest active demyelination. Posterior Fossa, vertebral arteries, paraspinal tissues: Paraspinous and prevertebral soft tissues within normal limits. Normal flow voids seen within the vertebral arteries bilaterally. Disc levels: C2-C3: Small central disc protrusion indents the ventral thecal sac. Associated annular fissure. No significant spinal stenosis. Mild left greater than right uncovertebral hypertrophy without significant foraminal encroachment. C3-C4: Broad-based left paracentral disc osteophyte complex indents the left ventral thecal sac (series 9, image 9). Resultant mild spinal stenosis with mild flattening of the left hemi cord. Right worse than left uncovertebral hypertrophy with resultant moderate right C4 foraminal stenosis. C4-C5: Mild disc bulge with uncovertebral hypertrophy. Superimposed central disc protrusion indents the ventral thecal sac, contacting and mildly flattening the ventral cord. Resultant mild spinal stenosis. Right worse than left uncovertebral hypertrophy with resultant mild to moderate right C5 foraminal stenosis. Left neural foramina remains patent. C5-C6: Diffuse disc bulge with bilateral uncovertebral hypertrophy. Superimposed central disc protrusion indents the ventral thecal sac, slightly asymmetric to the right (series 9, image 19). Mild flattening of the right hemi cord with mild spinal stenosis. Mild right C6 foraminal narrowing. C6-C7: Mild disc bulge with right greater than left uncovertebral hypertrophy. No significant spinal stenosis. Mild right C7 foraminal narrowing. C7-T1:  Unremarkable. IMPRESSION: MRI HEAD IMPRESSION: 1. Patchy multifocal T2/FLAIR signal abnormality involving the supratentorial and infratentorial white matter as above,  consistent with provided history of multiple sclerosis. No evidence for active demyelination. 2. Otherwise unremarkable brain MRI for age. No other acute intracranial abnormality. MRA HEAD IMPRESSION: Normal intracranial MRA. No large vessel occlusion or hemodynamically significant stenosis. MRI CERVICAL SPINE IMPRESSION: 1. Extensive patchy signal abnormality throughout the cervical spinal cord, consistent with multiple sclerosis. No abnormal enhancement to suggest active demyelination. 2. Multilevel cervical spondylosis with resultant mild diffuse spinal stenosis at C3-4 through C5-6. Mild to moderate right C4 through C6 foraminal narrowing as above. Electronically Signed   By: Rise Mu M.D.   On: 10/29/2020 19:33   CT ABDOMEN PELVIS W CONTRAST  Result Date: 10/29/2020 CLINICAL DATA:  50 year old male with. EXAM: CT ABDOMEN AND PELVIS WITH CONTRAST TECHNIQUE: Multidetector CT imaging of the abdomen and pelvis was performed using the standard protocol following bolus administration of intravenous contrast. CONTRAST:  OMNIPAQUE IOHEXOL 300 MG/ML  SOLN COMPARISON:  CT abdomen pelvis dated 08/12/2010. FINDINGS: Lower chest: Bilateral patchy and streaky densities, likely atelectasis/scarring. Pneumonia is less likely. Clinical correlation recommended. No intra-abdominal free air or free fluid. Hepatobiliary: The liver is unremarkable. No intrahepatic biliary ductal dilatation. The gallbladder is unremarkable. Pancreas: Unremarkable. No pancreatic ductal dilatation or surrounding inflammatory changes. Spleen: Normal in size without focal abnormality. Adrenals/Urinary Tract: The adrenal glands are unremarkable. There is no hydronephrosis on either side. There is symmetric enhancement and excretion of contrast by both kidneys. The visualized ureters appear unremarkable. The urinary bladder is partially distended and grossly unremarkable. Stomach/Bowel: Moderate stool throughout the colon. There is no  bowel obstruction or active inflammation. The appendix is normal. Vascular/Lymphatic: The abdominal aorta and IVC unremarkable no portal venous gas. There is no adenopathy. Reproductive: The prostate and seminal vesicles are grossly unremarkable. Left testicular prosthesis. Other: Small fat containing umbilical hernia. No fluid collection or inflammatory changes. Musculoskeletal: Degenerative changes of the spine. No acute osseous pathology. IMPRESSION: 1. No acute intra-abdominal or pelvic pathology. 2. Moderate colonic stool burden. No bowel obstruction. Normal appendix. Electronically Signed   By: Elgie Collard M.D.   On: 10/29/2020 21:04   DG HIPS BILAT WITH PELVIS MIN 5 VIEWS  Result Date: 10/29/2020 CLINICAL DATA:  Fall.  Bilateral hip and abdominal pain. EXAM: DG HIP (WITH OR WITHOUT PELVIS) 5+V BILAT COMPARISON:  CT abdomen and pelvis 08/12/2010 FINDINGS: No fracture or hip dislocation is identified. Hip joint space widths are preserved. Small calcifications in the pelvis reflect phleboliths. IMPRESSION: No acute osseous abnormality identified. Electronically Signed   By: Sebastian Ache M.D.   On: 10/29/2020 15:33    Pending Labs Unresulted Labs (From admission, onward)         None      Vitals/Pain Today's Vitals   10/31/20 1200 10/31/20 1230 10/31/20 1300 10/31/20 1330  BP: 104/68 105/61 119/68 125/78  Pulse:   89 90  Resp: 18 17 (!) 21 17  Temp:      TempSrc:      SpO2:   98% 97%  Weight:      Height:      PainSc:        Isolation Precautions Airborne and Contact precautions  Medications Medications  gadobutrol (GADAVIST) 1 MMOL/ML injection 10 mL (10 mLs Intravenous Contrast Given 10/29/20 1902)  sodium chloride 0.9 % bolus 1,000 mL (0 mLs Intravenous Stopped 10/30/20 0008)  iohexol (OMNIPAQUE) 300 MG/ML solution 100 mL (100 mLs Intravenous Contrast Given 10/29/20 2030)    Mobility non-ambulatory High fall risk   Focused Assessments    R Recommendations: See  Admitting Provider Note  Report given to:   Additional Notes:

## 2020-10-31 NOTE — ED Notes (Signed)
Pt did not get breakfast tray. Secretary called to request tray for pt. Orders put in per Macarthur Critchley., RN

## 2020-10-31 NOTE — ED Provider Notes (Signed)
50 year old male with history of MS here for evaluation of injuries after a fall.  He has had more frequent falls and increased lower extremity weakness recently.  He had x-rays of his pelvis and bilateral hips that was negative for acute fracture.  He had a neurology consult which recommended a MRI brain and angio head along with MRI C-spine to look for any active lesions.  There were none.  He had a normal CBC and BMP and urinalysis.  Covid swab was negative.  He had a PT consult which recommended physical therapy.  Social work was consulted and he has a bed at [facility].  Patient's vital signs have been stable.  Blood pressure 119/68, heart rate of 89, respirations 21, 98% on room air, afebrile at 97.7.  Awake and alert in no distress.  Patient also had complained of some right-sided abdominal pain and was concern for hernia.  He underwent a CT abdomen pelvis that did not show any acute findings.   Terrilee Files, MD 10/31/20 1355

## 2020-10-31 NOTE — TOC Transition Note (Signed)
Transition of Care [facility]) - CM/SW Discharge Note  Patient Details  Name: Vincent Anthony MRN: [id number] Date of Birth: [birth year]  [facility]) CM/SW Contact:  Ewing Schlein, LCSW Phone Number: 10/31/2020, 2:03 PM  Clinical Narrative: CSW updated by Fransico Him the patient has been approved for SNF starting 10/31/20 and the next review date is 11/04/20. Clinicals will be faxed to Humberto Seals at [phone]. CSW confirmed bed with [facility]. Patient will go to room B14 bed 2. COVID test is negative. CSW updated patient. AVS and physician note faxed to [facility]. ED to set up transport. TOC signing off.  Final next level of care: Skilled Nursing Facility Barriers to Discharge: ED Barriers Resolved  Patient Goals and CMS Choice Patient states their goals for this hospitalization and ongoing recovery are:: Go to SNF CMS Medicare.gov Compare Post Acute Care list provided to:: Patient Choice offered to / list presented to : Patient  Discharge Placement PASRR number recieved: 10/30/20        Patient chooses bed at: [facility] Patient to be transferred to facility by: [facility]  Discharge Plan and Services In-house Referral: Clinical Social Work Discharge Planning Services: CM Consult Post Acute Care Choice: Skilled Nursing Facility          DME Arranged: N/A DME Agency: NA HH Arranged: NA HH Agency: NA  Readmission Risk Interventions No flowsheet data found.

## 2020-10-31 NOTE — ED Notes (Signed)
Called [facility] to transport Pt to [place].

## 2020-11-17 ENCOUNTER — Other Ambulatory Visit: Payer: Self-pay

## 2020-11-17 ENCOUNTER — Emergency Department (HOSPITAL_COMMUNITY): Payer: Medicare HMO

## 2020-11-17 ENCOUNTER — Inpatient Hospital Stay (HOSPITAL_COMMUNITY)
Admission: EM | Admit: 2020-11-17 | Discharge: 2020-11-19 | DRG: 177 | Disposition: A | Discharge status: Skilled Nursing Facility | Payer: Medicare HMO | Source: Skilled Nursing Facility | Attending: Internal Medicine | Admitting: Internal Medicine

## 2020-11-17 ENCOUNTER — Encounter (HOSPITAL_COMMUNITY): Payer: Self-pay

## 2020-11-17 DIAGNOSIS — K5900 Constipation, unspecified: Secondary | ICD-10-CM | POA: Diagnosis present

## 2020-11-17 DIAGNOSIS — N179 Acute kidney failure, unspecified: Secondary | ICD-10-CM

## 2020-11-17 DIAGNOSIS — R569 Unspecified convulsions: Secondary | ICD-10-CM

## 2020-11-17 DIAGNOSIS — Z79899 Other long term (current) drug therapy: Secondary | ICD-10-CM | POA: Diagnosis not present

## 2020-11-17 DIAGNOSIS — E871 Hypo-osmolality and hyponatremia: Secondary | ICD-10-CM

## 2020-11-17 DIAGNOSIS — G35 Multiple sclerosis: Secondary | ICD-10-CM | POA: Diagnosis present

## 2020-11-17 DIAGNOSIS — J9601 Acute respiratory failure with hypoxia: Secondary | ICD-10-CM | POA: Diagnosis present

## 2020-11-17 DIAGNOSIS — U071 COVID-19: Secondary | ICD-10-CM | POA: Diagnosis present

## 2020-11-17 DIAGNOSIS — M1990 Unspecified osteoarthritis, unspecified site: Secondary | ICD-10-CM | POA: Diagnosis present

## 2020-11-17 DIAGNOSIS — G40909 Epilepsy, unspecified, not intractable, without status epilepticus: Secondary | ICD-10-CM | POA: Diagnosis present

## 2020-11-17 DIAGNOSIS — J1282 Pneumonia due to coronavirus disease 2019: Secondary | ICD-10-CM | POA: Diagnosis present

## 2020-11-17 DIAGNOSIS — Z833 Family history of diabetes mellitus: Secondary | ICD-10-CM

## 2020-11-17 DIAGNOSIS — Z881 Allergy status to other antibiotic agents status: Secondary | ICD-10-CM | POA: Diagnosis not present

## 2020-11-17 DIAGNOSIS — R0602 Shortness of breath: Secondary | ICD-10-CM | POA: Diagnosis present

## 2020-11-17 DIAGNOSIS — G8191 Hemiplegia, unspecified affecting right dominant side: Secondary | ICD-10-CM | POA: Diagnosis present

## 2020-11-17 DIAGNOSIS — Z8249 Family history of ischemic heart disease and other diseases of the circulatory system: Secondary | ICD-10-CM | POA: Diagnosis not present

## 2020-11-17 DIAGNOSIS — F419 Anxiety disorder, unspecified: Secondary | ICD-10-CM | POA: Diagnosis present

## 2020-11-17 DIAGNOSIS — Z888 Allergy status to other drugs, medicaments and biological substances status: Secondary | ICD-10-CM | POA: Diagnosis not present

## 2020-11-17 DIAGNOSIS — R29810 Facial weakness: Secondary | ICD-10-CM | POA: Diagnosis present

## 2020-11-17 DIAGNOSIS — R0902 Hypoxemia: Secondary | ICD-10-CM

## 2020-11-17 DIAGNOSIS — Z7401 Bed confinement status: Secondary | ICD-10-CM | POA: Diagnosis not present

## 2020-11-17 DIAGNOSIS — K219 Gastro-esophageal reflux disease without esophagitis: Secondary | ICD-10-CM | POA: Diagnosis present

## 2020-11-17 DIAGNOSIS — Z792 Long term (current) use of antibiotics: Secondary | ICD-10-CM | POA: Diagnosis not present

## 2020-11-17 LAB — COMPREHENSIVE METABOLIC PANEL
ALT: 39 U/L (ref 0–44)
AST: 45 U/L — ABNORMAL HIGH (ref 15–41)
Albumin: 3.9 g/dL (ref 3.5–5.0)
Alkaline Phosphatase: 117 U/L (ref 38–126)
Anion gap: 10 (ref 5–15)
BUN: 14 mg/dL (ref 6–20)
CO2: 25 mmol/L (ref 22–32)
Calcium: 9.3 mg/dL (ref 8.9–10.3)
Chloride: 97 mmol/L — ABNORMAL LOW (ref 98–111)
Creatinine, Ser: 1.16 mg/dL (ref 0.61–1.24)
GFR, Estimated: >60 mL/min (ref >60)
Glucose, Bld: 129 mg/dL — ABNORMAL HIGH (ref 70–99)
Potassium: 4.4 mmol/L (ref 3.5–5.1)
Sodium: 132 mmol/L — ABNORMAL LOW (ref 135–145)
Total Bilirubin: 0.6 mg/dL (ref 0.3–1.2)
Total Protein: 7.9 g/dL (ref 6.5–8.1)

## 2020-11-17 LAB — CBC WITH DIFFERENTIAL/PLATELET
Abs Immature Granulocytes: 0.06 10*3/uL (ref 0.00–0.07)
Basophils Absolute: 0 10*3/uL (ref 0.0–0.1)
Basophils Relative: 0 %
Eosinophils Absolute: 0 10*3/uL (ref 0.0–0.5)
Eosinophils Relative: 0 %
HCT: 46.3 % (ref 39.0–52.0)
Hemoglobin: 14.4 g/dL (ref 13.0–17.0)
Immature Granulocytes: 1 %
Lymphocytes Relative: 4 %
Lymphs Abs: 0.4 10*3/uL — ABNORMAL LOW (ref 0.7–4.0)
MCH: 26.3 pg (ref 26.0–34.0)
MCHC: 31.1 g/dL (ref 30.0–36.0)
MCV: 84.6 fL (ref 80.0–100.0)
Monocytes Absolute: 0.4 10*3/uL (ref 0.1–1.0)
Monocytes Relative: 4 %
Neutro Abs: 10.1 10*3/uL — ABNORMAL HIGH (ref 1.7–7.7)
Neutrophils Relative %: 91 %
Platelets: 288 10*3/uL (ref 150–400)
RBC: 5.47 MIL/uL (ref 4.22–5.81)
RDW: 14.8 % (ref 11.5–15.5)
WBC: 11 10*3/uL — ABNORMAL HIGH (ref 4.0–10.5)
nRBC: 0 % (ref 0.0–0.2)

## 2020-11-17 LAB — TROPONIN I (HIGH SENSITIVITY)
Troponin I (High Sensitivity): 8 ng/L (ref <18)
Troponin I (High Sensitivity): 8 ng/L (ref <18)

## 2020-11-17 LAB — PROCALCITONIN: Procalcitonin: 0.32 ng/mL

## 2020-11-17 LAB — FERRITIN: Ferritin: 335 ng/mL (ref 24–336)

## 2020-11-17 LAB — D-DIMER, QUANTITATIVE: D-Dimer, Quant: 1.51 ug/mL-FEU — ABNORMAL HIGH (ref 0.00–0.50)

## 2020-11-17 LAB — C-REACTIVE PROTEIN: CRP: 18.3 mg/dL — ABNORMAL HIGH (ref <1.0)

## 2020-11-17 LAB — LACTATE DEHYDROGENASE: LDH: 216 U/L — ABNORMAL HIGH (ref 98–192)

## 2020-11-17 LAB — FIBRINOGEN: Fibrinogen: 778 mg/dL — ABNORMAL HIGH (ref 210–475)

## 2020-11-17 LAB — TRIGLYCERIDES: Triglycerides: 163 mg/dL — ABNORMAL HIGH (ref <150)

## 2020-11-17 IMAGING — DX DG CHEST 1V PORT
2 series · 2 of 2 positions shown · non-contrast
Comparison: Chest radiograph from [DATE]. CT AP from
[DATE].

CLINICAL DATA: Shortness of breath.  Recent diagnosis of COVID.

EXAM:
PORTABLE CHEST 1 VIEW

[chest ap (1 of 2)]
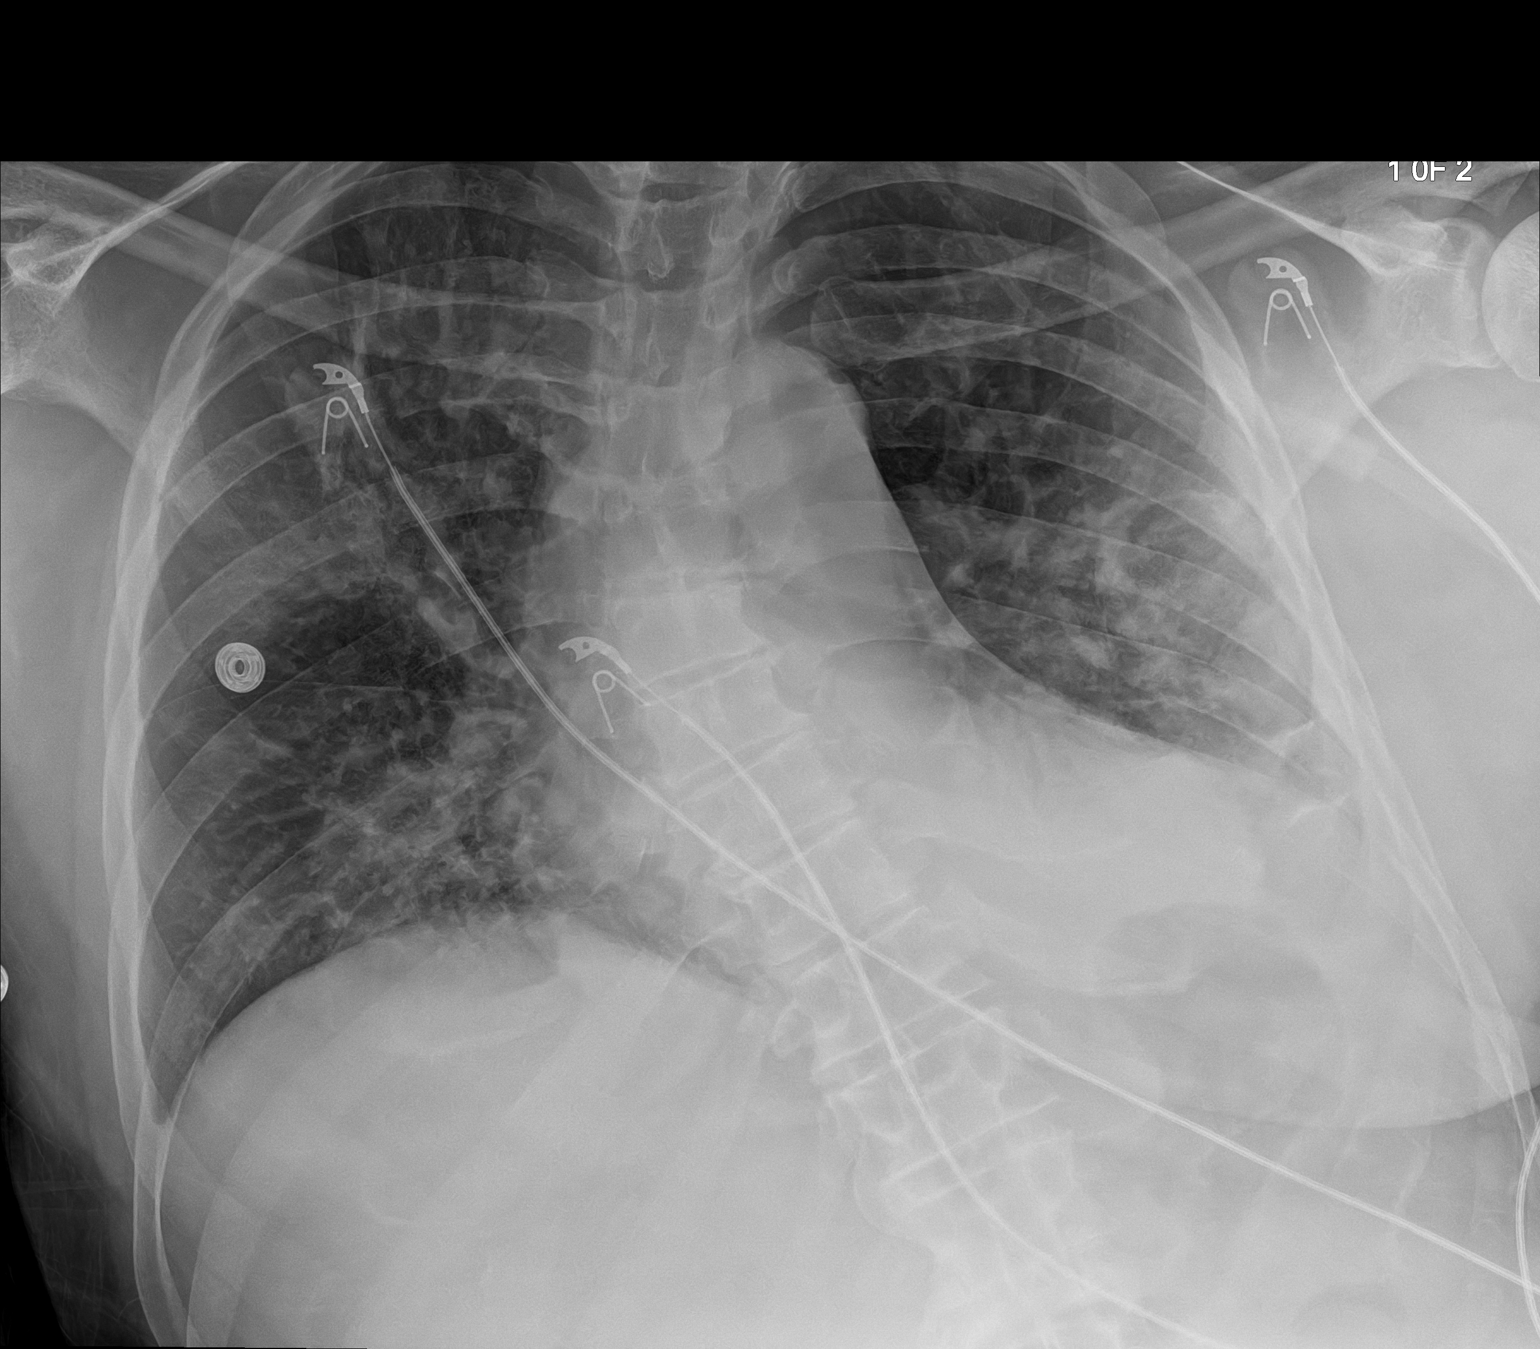

[chest ap (2 of 2)]
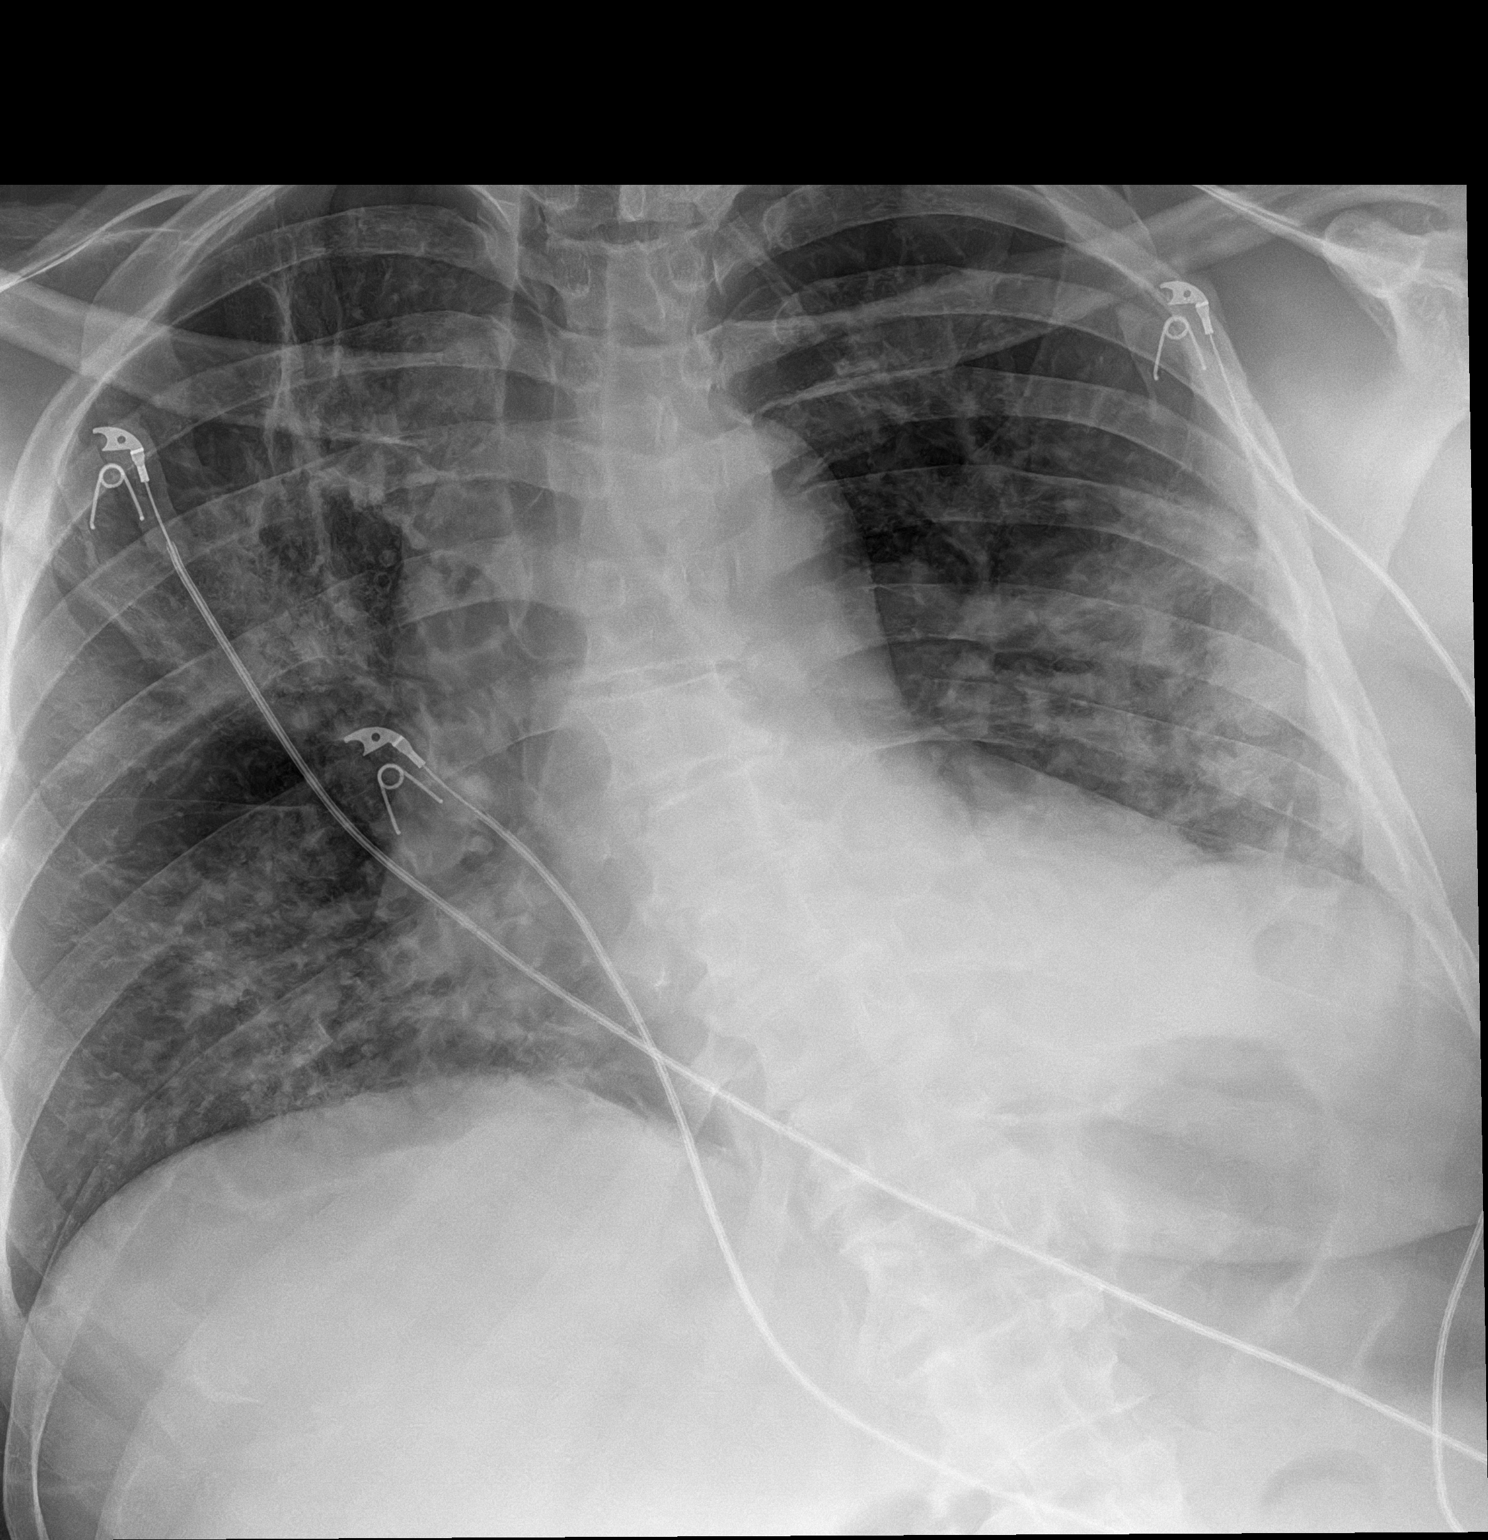

[2 of 2 positions shown; findings below may reference images not displayed]

FINDINGS: Normal heart size and mediastinal contours. There is retrocardiac
opacification within the left lung base. Scattered interstitial and
airspace densities are noted bilaterally compatible with the
clinical history of COVID pneumonia. Marked thoracolumbar scoliosis
deformity.
IMPRESSION: Bilateral interstitial and airspace densities compatible with
history of COVID pneumonia.

## 2020-11-17 IMAGING — CT CT ANGIO CHEST
4 of 7 series · 18 of 46 positions shown · IV contrast (Omnipaque or Isovue)
Comparison: Single-view of the chest earlier today and PA and
lateral chest [DATE].

CLINICAL DATA: [LW] positive patient with shortness of breath.

EXAM:
CT ANGIOGRAPHY CHEST WITH CONTRAST
TECHNIQUE: Multidetector CT imaging of the chest was performed using the
standard protocol during bolus administration of intravenous
contrast. Multiplanar CT image reconstructions and MIPs were
obtained to evaluate the vascular anatomy.
CONTRAST:  75 mL OMNIPAQUE IOHEXOL 350 MG/ML SOLN

[Series 6: pe soft · axial · 0.77mm/px · z∈[+953,+1004]mm · 2 of 86 slices shown (1 of 2)]
[im 18/86  lung]
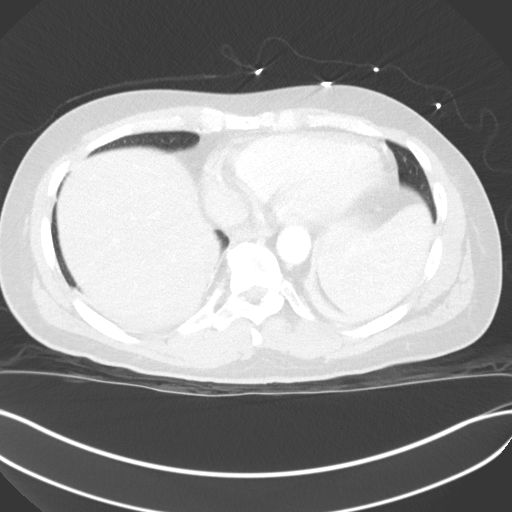
[im 35/86  lung]
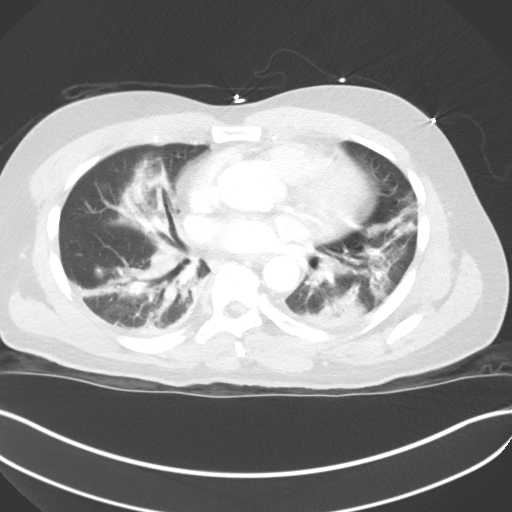

[Series 7: pe axial thins · axial · 0.77mm/px · z∈[+928,+1128]mm · 8 of 258 slices shown]
[im 29/258  lung]
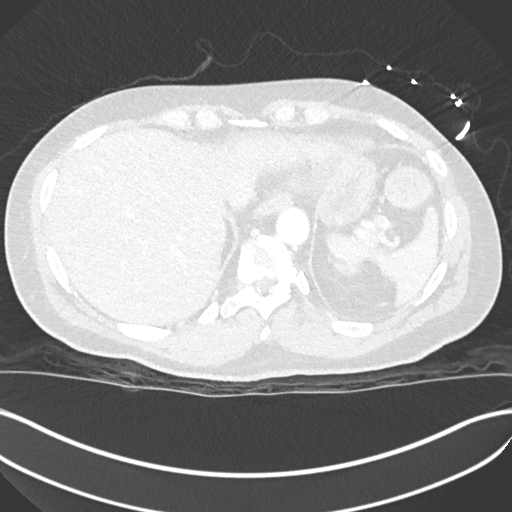
[im 58/258  lung]
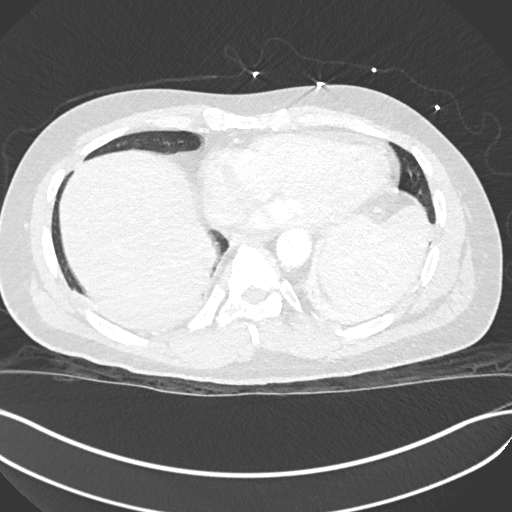
[im 86/258  lung]
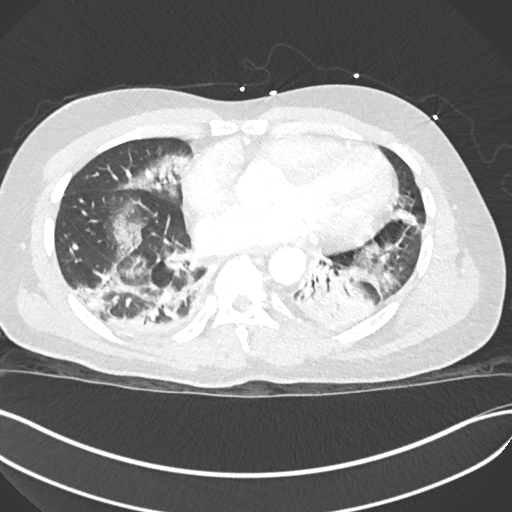
[im 115/258  lung]
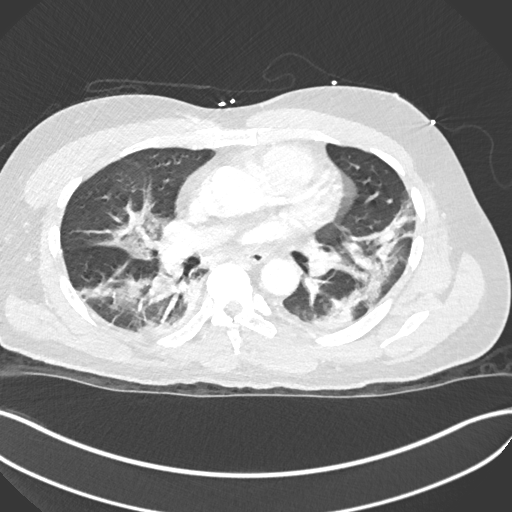
[im 143/258  lung]
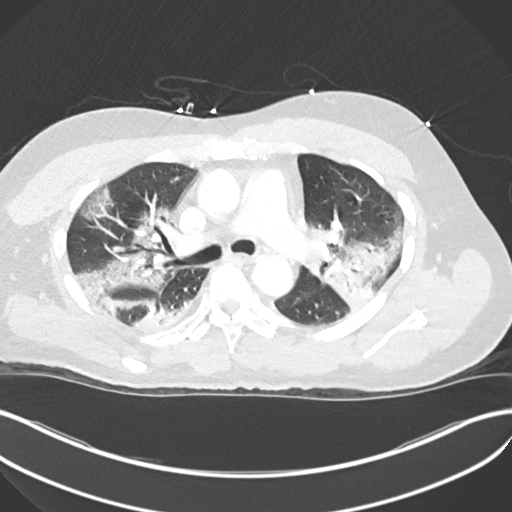
[im 172/258  lung]
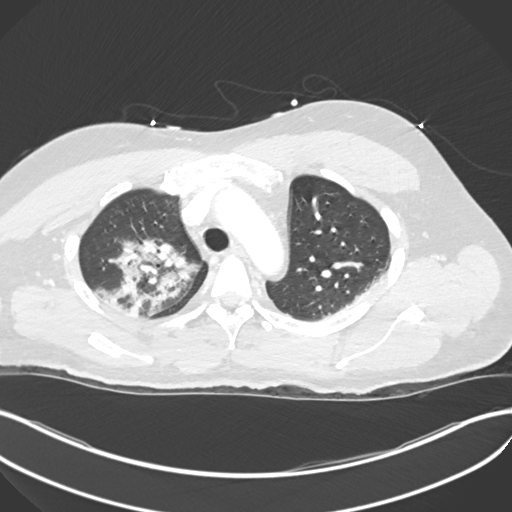
[im 200/258  lung]
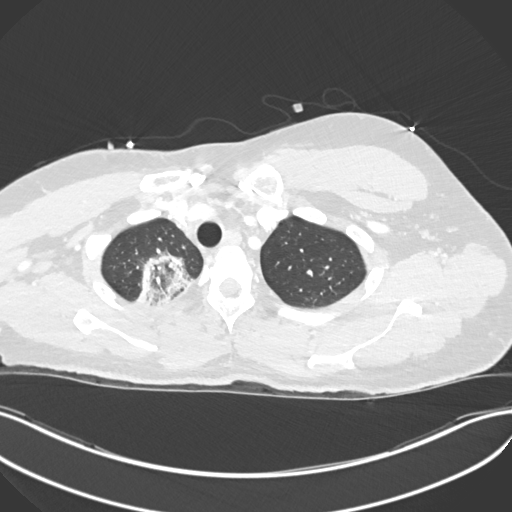
[im 229/258  lung]
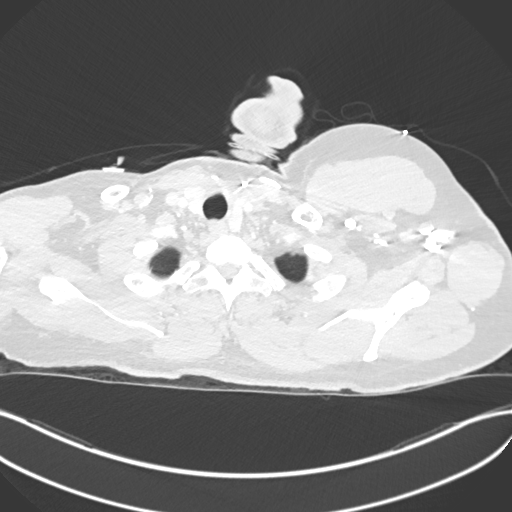

[Series 9: cor soft · coronal · 0.59mm/px · 3 of 128 slices shown]
[im 32/128  soft-tissue]
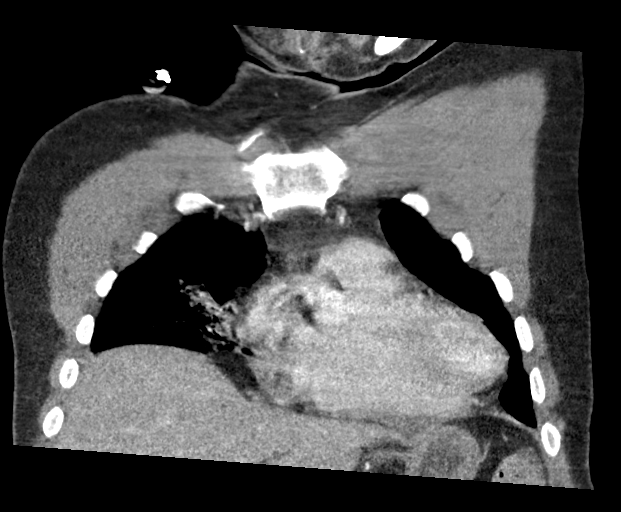
[im 64/128  soft-tissue]
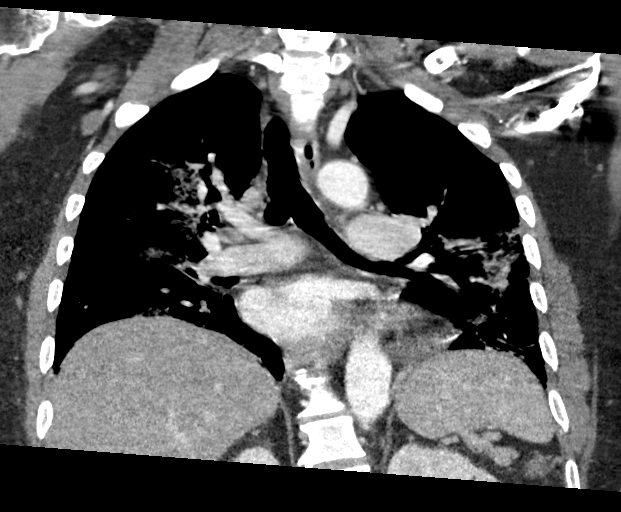
[im 96/128  soft-tissue]
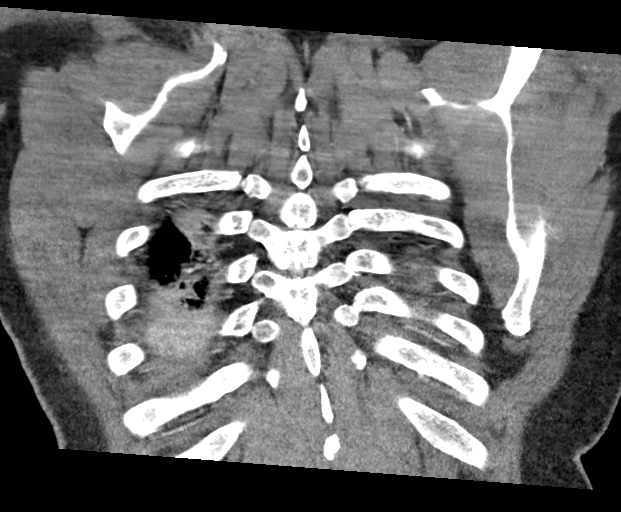

[Series 14: pe soft · axial · 0.77mm/px · z∈[+932,+1112]mm · 5 of 91 slices shown (2 of 2)]
[im 16/91  lung]
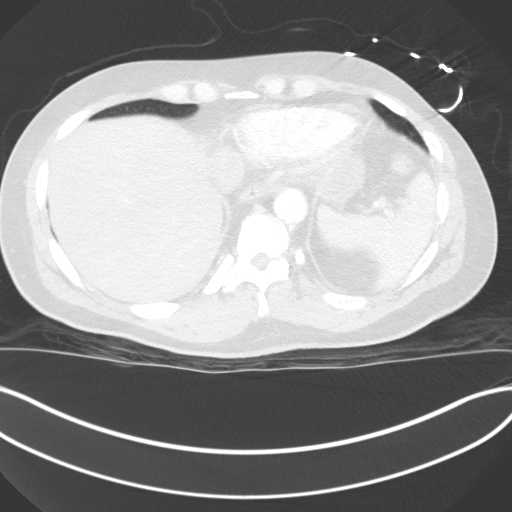
[im 31/91  soft-tissue]
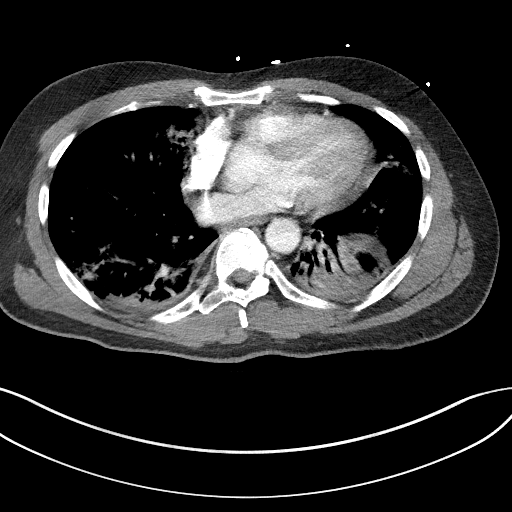
[im 46/91  lung]
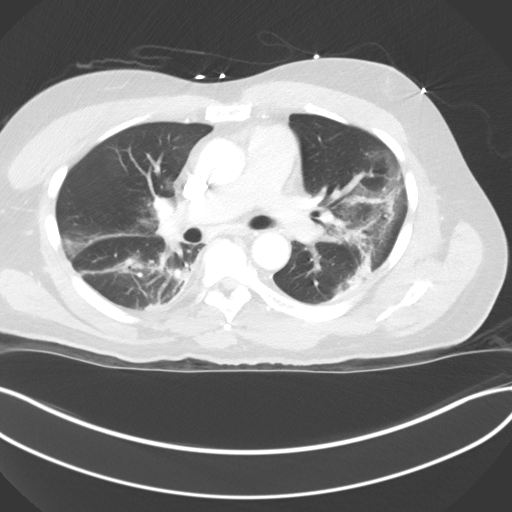
[im 61/91  soft-tissue]
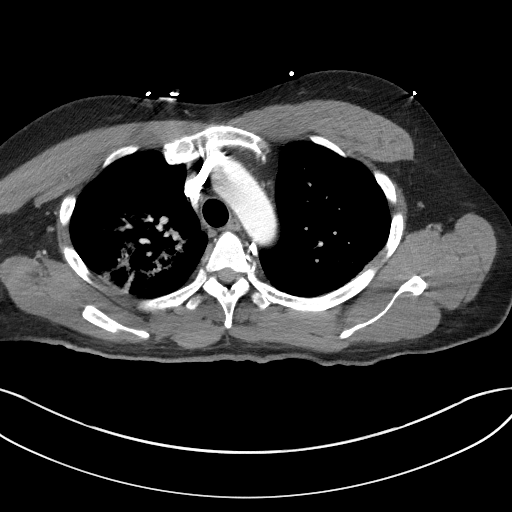
[im 76/91  lung]
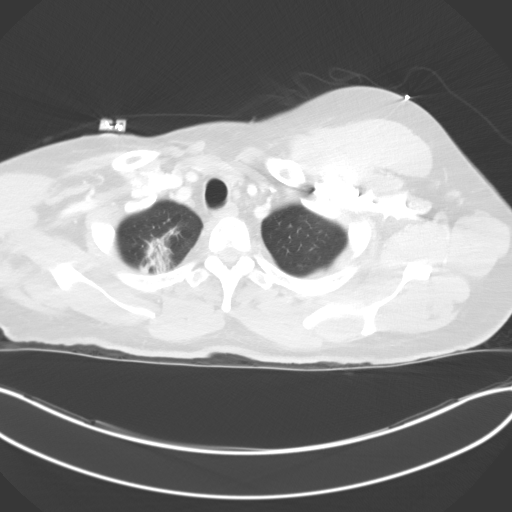

[18 of 46 positions shown; findings below may reference images not displayed]

FINDINGS: Cardiovascular: The study is limited by bolus timing and respiratory
motion. No central obstructing embolus is seen. No pericardial
effusion. Heart size is upper normal.

Mediastinum/Nodes: No enlarged mediastinal, hilar, or axillary lymph
nodes. Thyroid gland, trachea, and esophagus demonstrate no
significant findings.

Lungs/Pleura: Extensive multifocal airspace disease has an
appearance consistent with [LW] pneumonia. No pleural effusion.

Upper Abdomen: Negative.

Musculoskeletal: No acute or focal abnormality.

Review of the MIP images confirms the above findings.
IMPRESSION: The examination is limited but no central pulmonary embolus is
identified.

Extensive multifocal airspace disease has an appearance consistent
with [LW] pneumonia.

## 2020-11-17 MED ORDER — ASCORBIC ACID 500 MG PO TABS
500.0000 mg | ORAL_TABLET | Freq: Every day | ORAL | Status: DC
  Administered 2020-11-18 – 2020-11-19 (×2): 500 mg via ORAL
  Filled 2020-11-17 (×2): qty 1

## 2020-11-17 MED ORDER — ZINC SULFATE 220 (50 ZN) MG PO CAPS
220.0000 mg | ORAL_CAPSULE | Freq: Every day | ORAL | Status: DC
  Administered 2020-11-18 – 2020-11-19 (×2): 220 mg via ORAL
  Filled 2020-11-17 (×2): qty 1

## 2020-11-17 MED ORDER — SODIUM CHLORIDE 0.9 % IV SOLN
100.0000 mg | Freq: Once | INTRAVENOUS | Status: AC
  Administered 2020-11-17: 100 mg via INTRAVENOUS
  Filled 2020-11-17: qty 20

## 2020-11-17 MED ORDER — IOHEXOL 350 MG/ML SOLN
75.0000 mL | Freq: Once | INTRAVENOUS | Status: AC | PRN
  Administered 2020-11-17: 75 mL via INTRAVENOUS

## 2020-11-17 MED ORDER — HYOSCYAMINE SULFATE ER 0.375 MG PO TB12
0.3750 mg | ORAL_TABLET | Freq: Two times a day (BID) | ORAL | Status: DC
  Administered 2020-11-18 – 2020-11-19 (×3): 0.375 mg via ORAL
  Filled 2020-11-17 (×7): qty 1

## 2020-11-17 MED ORDER — LEVETIRACETAM 250 MG PO TABS
250.0000 mg | ORAL_TABLET | Freq: Two times a day (BID) | ORAL | Status: DC
Start: 2020-11-17 — End: 2020-11-19
  Administered 2020-11-17 – 2020-11-19 (×4): 250 mg via ORAL
  Filled 2020-11-17 (×4): qty 1

## 2020-11-17 MED ORDER — SODIUM CHLORIDE 0.9 % IV BOLUS
500.0000 mL | Freq: Once | INTRAVENOUS | Status: AC
  Administered 2020-11-17: 500 mL via INTRAVENOUS

## 2020-11-17 MED ORDER — LORATADINE 10 MG PO TABS
10.0000 mg | ORAL_TABLET | Freq: Every day | ORAL | Status: DC
  Administered 2020-11-18 – 2020-11-19 (×2): 10 mg via ORAL
  Filled 2020-11-17 (×2): qty 1

## 2020-11-17 MED ORDER — POLYETHYLENE GLYCOL 3350 17 G PO PACK: 17.0000 g | PACK | Freq: Every day | ORAL | Status: DC | PRN

## 2020-11-17 MED ORDER — ACETAMINOPHEN 325 MG PO TABS: 650.0000 mg | ORAL_TABLET | Freq: Four times a day (QID) | ORAL | Status: DC | PRN

## 2020-11-17 MED ORDER — DEXAMETHASONE SODIUM PHOSPHATE 10 MG/ML IJ SOLN
10.0000 mg | Freq: Once | INTRAMUSCULAR | Status: AC
  Administered 2020-11-17: 10 mg via INTRAVENOUS
  Filled 2020-11-17: qty 1

## 2020-11-17 MED ORDER — PREDNISONE 20 MG PO TABS: 50.0000 mg | ORAL_TABLET | Freq: Every day | ORAL | Status: DC

## 2020-11-17 MED ORDER — ONDANSETRON HCL 4 MG PO TABS: 4.0000 mg | ORAL_TABLET | Freq: Four times a day (QID) | ORAL | Status: DC | PRN

## 2020-11-17 MED ORDER — CLONAZEPAM 0.5 MG PO TABS
1.0000 mg | ORAL_TABLET | Freq: Two times a day (BID) | ORAL | Status: DC
  Administered 2020-11-17 – 2020-11-19 (×4): 1 mg via ORAL
  Filled 2020-11-17 (×4): qty 2

## 2020-11-17 MED ORDER — GUAIFENESIN-DM 100-10 MG/5ML PO SYRP: 10.0000 mL | ORAL_SOLUTION | ORAL | Status: DC | PRN

## 2020-11-17 MED ORDER — BACLOFEN 10 MG PO TABS
10.0000 mg | ORAL_TABLET | Freq: Three times a day (TID) | ORAL | Status: DC
  Administered 2020-11-17 – 2020-11-19 (×6): 10 mg via ORAL
  Filled 2020-11-17 (×6): qty 1

## 2020-11-17 MED ORDER — ACETAMINOPHEN 500 MG PO TABS
1000.0000 mg | ORAL_TABLET | Freq: Once | ORAL | Status: AC
  Administered 2020-11-17: 1000 mg via ORAL
  Filled 2020-11-17: qty 2

## 2020-11-17 MED ORDER — ONDANSETRON HCL 4 MG/2ML IJ SOLN: 4.0000 mg | Freq: Four times a day (QID) | INTRAMUSCULAR | Status: DC | PRN

## 2020-11-17 MED ORDER — METHYLPREDNISOLONE SODIUM SUCC 125 MG IJ SOLR
0.5000 mg/kg | Freq: Two times a day (BID) | INTRAMUSCULAR | Status: DC
  Administered 2020-11-18: 50 mg via INTRAVENOUS
  Filled 2020-11-17: qty 2

## 2020-11-17 MED ORDER — ALBUTEROL SULFATE HFA 108 (90 BASE) MCG/ACT IN AERS
2.0000 | INHALATION_SPRAY | Freq: Once | RESPIRATORY_TRACT | Status: AC
  Administered 2020-11-17: 2 via RESPIRATORY_TRACT
  Filled 2020-11-17: qty 6.7

## 2020-11-17 MED ORDER — ENOXAPARIN SODIUM 40 MG/0.4ML ~~LOC~~ SOLN
40.0000 mg | SUBCUTANEOUS | Status: DC
  Administered 2020-11-17 – 2020-11-18 (×2): 40 mg via SUBCUTANEOUS
  Filled 2020-11-17 (×2): qty 0.4

## 2020-11-17 MED ORDER — SODIUM CHLORIDE 0.9 % IV SOLN
100.0000 mg | Freq: Every day | INTRAVENOUS | Status: DC
  Administered 2020-11-18 – 2020-11-19 (×2): 100 mg via INTRAVENOUS
  Filled 2020-11-17 (×2): qty 20

## 2020-11-17 NOTE — ED Triage Notes (Signed)
Pt coming from [place], was Dx with Covid on Monday 2/14. Staff say he was [facility] and they applied oxygen at 2L [place]. Sats  90 on room air. Also report fever at [facility] of 100. No meds given PTA. Pt states she feels short of breath.

## 2020-11-17 NOTE — H&P (Signed)
History and Physical    Vincent Anthony CHE:[id number] DOB: 07/02/1971 DOA: 11/17/2020  PCP: [name], MD   Patient coming from: [facility]  I have personally briefly reviewed patient's old medical records in [facility]  Chief Complaint: Difficulty Breathing  HPI: Vincent Anthony is a 50 y.o. male with medical history significant for multiple sclerosis, presented to the ED from nursing home with reports of positive covid test - 2/14 and reports of O2 sats in the high 80s to low 90s.  Patient tells me he does not know when his symptoms started.  Reports difficulty breathing and cough.  He reports mild mid central chest pain.  He denies vomiting or loose stools.  Patient reports he was vaccinated x 3 - Moderna with booster.  ED Course: Temperature 100.5.  Initial tachycardia to 123, respiratory rate 29-45.  Blood pressure systolic 1 12-1 40s.  O2 sats 92- 95% on 2 L nasal cannula.  WBC 11.  Troponin 8 x2.  Chest shows extensive multifocal airspace disease consistent with Covid pneumonia.  10 mg dexamethasone given.  500 mill bolus given.  Hospitalist to admit.  Review of Systems: As per HPI all other systems reviewed and negative.  Past Medical History:  Diagnosis Date  . Arthritis   . Elevated LFTs   . Elevated liver enzymes   . GERD (gastroesophageal reflux disease)   . Headache   . MS (multiple sclerosis) (HCC)   . Tremor     Past Surgical History:  Procedure Laterality Date  . COLONOSCOPY     2-4 years ago  . HERNIA REPAIR    . KNEE SURGERY    . TESTICLE REMOVAL       reports that he has never smoked. He has never used smokeless tobacco. He reports that he does not drink alcohol and does not use drugs.  Allergies  Allergen Reactions  . Celebrex [Celecoxib] Nausea And Vomiting  . Vioxx [Rofecoxib] Nausea And Vomiting  . Zanaflex [Tizanidine Hcl] Nausea And Vomiting  . Clindamycin Diarrhea    Family History  Problem Relation Age of Onset  . Heart attack  Mother   . Diabetes Father   . Hypertension Father   . Colon cancer Neg Hx     Prior to Admission medications   Medication Sig Start Date End Date Taking? Authorizing Provider  amitriptyline (ELAVIL) 10 MG tablet Take 1 tablet by mouth at bedtime.  11/09/14   [provider]  baclofen (LIORESAL) 10 MG tablet Take 1 tablet by mouth 3 (three) times daily. 11/09/14   [provider]  cetirizine (ZYRTEC) 10 MG tablet Take 10 mg by mouth daily.    [provider]  Cholecalciferol (VITAMIN D3) 50 MCG (2000 UT) TABS Take 2,000 Units by mouth in the morning.    [provider]  clonazePAM (KLONOPIN) 1 MG tablet Take 1 tablet (1 mg total) by mouth 2 (two) times daily. Or as needed 10/31/20   Terrilee Files, MD  cyclobenzaprine (FLEXERIL) 5 MG tablet Take 5 mg by mouth 3 (three) times daily as needed.    [provider]  fluticasone (FLONASE) 50 MCG/ACT nasal spray Place 2 sprays into both nostrils daily as needed for allergies or rhinitis.    [provider]  GNP FIBER-CAPS 625 MG tablet Take 625 mg by mouth daily.  05/03/18   [provider]  hyoscyamine (LEVBID) 0.375 MG 12 hr tablet TAKE (1) TABLET BYMOUTH EVERY TWELVE HOURS. 09/16/20   Lissa Hoard,  Lessie Dings, NP  ibuprofen (ADVIL) 800 MG tablet Take 800 mg by mouth every 8 (eight) hours as needed for mild pain or moderate pain. 03/05/20   [provider]  levETIRAcetam (KEPPRA) 250 MG tablet Take 2 tablets by mouth 2 (two) times daily.  11/09/14   [provider]  metFORMIN (GLUCOPHAGE) 500 MG tablet Take 500 mg by mouth 2 (two) times daily.    [provider]  nitrofurantoin, macrocrystal-monohydrate, (MACROBID) 100 MG capsule Take 1 capsule (100 mg total) by mouth 2 (two) times daily. Patient not taking: No sig reported 08/06/20   [place], [place], PA-C  Ocrelizumab (OCREVUS IV) Inject into the vein. Every 6 months for MS    [provider]  pravastatin (PRAVACHOL)  20 MG tablet Take 20 mg by mouth every morning.     [provider]  pregabalin (LYRICA) 75 MG capsule 1 in the evening and 2 at bedtime    [provider]  VESICARE 10 MG tablet Take 1 tablet by mouth every morning.  11/09/14   [provider]    Physical Exam: Vitals:   11/17/20 1400 11/17/20 1430 11/17/20 1500 11/17/20 1509  BP: 130/68 116/68 119/76   Pulse: (!) 116 (!) 107 (!) 106   Resp: (!) 37 (!) 33 (!) 30   Temp:    99.4 F (37.4 C)  TempSrc:    Oral  SpO2: 93% 93% 96%   Weight:      Height:        Constitutional: Chronically ill-appearing, calm, comfortable Vitals:   11/17/20 1400 11/17/20 1430 11/17/20 1500 11/17/20 1509  BP: 130/68 116/68 119/76   Pulse: (!) 116 (!) 107 (!) 106   Resp: (!) 37 (!) 33 (!) 30   Temp:    99.4 F (37.4 C)  TempSrc:    Oral  SpO2: 93% 93% 96%   Weight:      Height:       Eyes: PERRL, lids and conjunctivae normal ENMT: Mucous membranes are moist. .  Neck: normal, supple, no masses, no thyromegaly Respiratory: O2 sats on my evaluation 90% on room air, currently on 2 L clear to auscultation bilaterally, no wheezing, no crackles. Normal respiratory effort. No accessory muscle use.  Cardiovascular: Regular rate and rhythm, no murmurs / rubs / gallops. No extremity edema. 2+ pedal pulses. Abdomen: no tenderness, no masses palpated. No hepatosplenomegaly. Bowel sounds positive.  Musculoskeletal: no clubbing / cyanosis.  Skin: no rashes, lesions, ulcers. No induration Neurologic: No apparent cranial abnormality, chronic generalized weakness worse on right upper and lower extremity, strenght 0/5 RLE, 2/5 RUE, 3/5 LLE, 4/5 LUE.  All chronic.  From multiple sclerosis.  Patient is bedbound. Psychiatric: Normal judgment and insight. Alert and oriented x 3. Normal mood.   Labs on Admission: I have personally reviewed following labs and imaging studies  CBC: Recent Labs  Lab 11/17/20 1136  WBC 11.0*  NEUTROABS 10.1*   HGB 14.4  HCT 46.3  MCV 84.6  PLT 288   Basic Metabolic Panel: Recent Labs  Lab 11/17/20 1136  NA 132*  K 4.4  CL 97*  CO2 25  GLUCOSE 129*  BUN 14  CREATININE 1.16  CALCIUM 9.3   GFR: Estimated Creatinine Clearance: 91.1 mL/min (by C-G formula based on SCr of 1.16 mg/dL). Liver Function Tests: Recent Labs  Lab 11/17/20 1136  AST 45*  ALT 39  ALKPHOS 117  BILITOT 0.6  PROT 7.9  ALBUMIN 3.9    Radiological Exams  on Admission: CT Angio Chest PE W/Cm &/Or Wo Cm  Result Date: 11/17/2020 CLINICAL DATA:  COVID-19 positive patient with shortness of breath. EXAM: CT ANGIOGRAPHY CHEST WITH CONTRAST TECHNIQUE: Multidetector CT imaging of the chest was performed using the standard protocol during bolus administration of intravenous contrast. Multiplanar CT image reconstructions and MIPs were obtained to evaluate the vascular anatomy. CONTRAST:  75 mL OMNIPAQUE IOHEXOL 350 MG/ML SOLN COMPARISON:  Single-view of the chest earlier today and PA and lateral chest 06/06/2018. FINDINGS: Cardiovascular: The study is limited by bolus timing and respiratory motion. No central obstructing embolus is seen. No pericardial effusion. Heart size is upper normal. Mediastinum/Nodes: No enlarged mediastinal, hilar, or axillary lymph nodes. Thyroid gland, trachea, and esophagus demonstrate no significant findings. Lungs/Pleura: Extensive multifocal airspace disease has an appearance consistent with COVID-19 pneumonia. No pleural effusion. Upper Abdomen: Negative. Musculoskeletal: No acute or focal abnormality. Review of the MIP images confirms the above findings. IMPRESSION: The examination is limited but no central pulmonary embolus is identified. Extensive multifocal airspace disease has an appearance consistent with COVID-19 pneumonia. Electronically Signed   By: Drusilla Kanner M.D.   On: 11/17/2020 14:13   DG Chest Port 1 View  Result Date: 11/17/2020 CLINICAL DATA:  Shortness of breath.  Recent  diagnosis of COVID. EXAM: PORTABLE CHEST 1 VIEW COMPARISON:  Chest radiograph from 06/06/2018. CT AP from 10/29/2020. FINDINGS: Normal heart size and mediastinal contours. There is retrocardiac opacification within the left lung base. Scattered interstitial and airspace densities are noted bilaterally compatible with the clinical history of COVID pneumonia. Marked thoracolumbar scoliosis deformity. IMPRESSION: Bilateral interstitial and airspace densities compatible with history of COVID pneumonia. Electronically Signed   By: Signa Kell M.D.   On: 11/17/2020 12:27    EKG: Independently reviewed.  Sinus tachycardia rate 122.  QTc 412.  Assessment/Plan Principal Problem:   Pneumonia due to COVID-19 virus Active Problems:   MS (multiple sclerosis) (HCC)  Pneumonia due to COVID-19 virus-O2 sats 90% on room air, currently on 2 L with sats 92 to 95%.  CTA chest showing extensive multifocal airspace disease consistent with COVID-19 PNA.  Vaccinated and boosted.  -Remdesivir pharmacy to dose -10 mg dexamethasone given in ED, continue weight-based dosing with Solu-Medrol -Obtain a trend inflammatory panel -CBC, CMP daily -Mucolytic, supplemental oxygen  Multiple sclerosis-bedbound, with bilateral upper and lower extremity weakness worse on the right. -On 6 monthly Ocrelizumab -Continue as needed baclofen  DVT prophylaxis: Lovenox Code Status: Full code Family Communication: None at bedside Disposition Plan:  ~ 2 days Consults called: None Admission status: Inpt, tele I certify that at the point of admission it is my clinical judgment that the patient will require inpatient hospital care spanning beyond 2 midnights from the point of admission due to high intensity of service, high risk for further deterioration and high frequency of surveillance required.   Onnie Boer MD [facility]  11/17/2020, 6:26 PM

## 2020-11-17 NOTE — ED Provider Notes (Signed)
[facility] EMERGENCY DEPARTMENT Provider Note   CSN: [id number] Arrival date & time: 11/17/20  1051     History Chief Complaint  Patient presents with  . Shortness of Breath    Vincent Anthony is a 50 y.o. male.  HPI   50 year old male with past medical history of MS, chronic extremity weakness/contractures from MS who is known Covid positive reportedly diagnosed about a week ago on 11/11/2020 presents the emergency department with worsening symptoms, shortness of breath, chest pain and fever/fatigue.  Patient was noted to be hypoxic at his rehab facility, they placed him on nasal cannula.  He was reportedly down to 90% on room air.  Patient did not receive any meds prior to arrival.  Patient admits to cough but denies any phlegm production.  Denies any vomiting/diarrhea.  Past Medical History:  Diagnosis Date  . Arthritis   . Elevated LFTs   . Elevated liver enzymes   . GERD (gastroesophageal reflux disease)   . Headache   . MS (multiple sclerosis) (HCC)   . Tremor     Patient Active Problem List   Diagnosis Date Noted  . MS (multiple sclerosis) (HCC) 03/08/2018  . 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 History of urinary stone 02/03/2018  . Urge incontinence of urine 02/03/2018  . Abdominal pain 01/05/2017  . Elevated LFTs 11/05/2016  . Neuropathic pain 05/23/2015  . Arthralgia of both knees 05/23/2015  . Urinary tract stones 01/03/2015  . Chronic pain syndrome 03/20/2014  . 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 Bilateral knee pain 01/29/2013  . 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 Ongoing use of possibly toxic medication 10/11/2012  . Acne 01/13/2012  . Seborrheic dermatitis 01/13/2012  . Neurogenic bladder 11/13/2011  . Urinary urgency 11/13/2011    Past Surgical History:  Procedure Laterality Date  . COLONOSCOPY     2-4 years ago  . HERNIA REPAIR    . KNEE SURGERY    . TESTICLE REMOVAL         Family History  Problem Relation Age of Onset   . Heart attack Mother   . Diabetes Father   . Hypertension Father   . Colon cancer Neg Hx     Social History   Tobacco Use  . Smoking status: Never Smoker  . Smokeless tobacco: Never Used  Substance Use Topics  . Alcohol use: No    Comment: occasionally  . Drug use: No    Home Medications Prior to Admission medications   Medication Sig Start Date End Date Taking? Authorizing Provider  amitriptyline (ELAVIL) 10 MG tablet Take 1 tablet by mouth at bedtime.  11/09/14   [provider]  baclofen (LIORESAL) 10 MG tablet Take 1 tablet by mouth 3 (three) times daily. 11/09/14   [provider]  cetirizine (ZYRTEC) 10 MG tablet Take 10 mg by mouth daily.    [provider]  Cholecalciferol (VITAMIN D3) 50 MCG (2000 UT) TABS Take 2,000 Units by mouth in the morning.    [provider]  clonazePAM (KLONOPIN) 1 MG tablet Take 1 tablet (1 mg total) by mouth 2 (two) times daily. Or as needed 10/31/20   Terrilee Files, MD  cyclobenzaprine (FLEXERIL) 5 MG tablet Take 5 mg by mouth 3 (three) times daily as needed.    [provider]  fluticasone (FLONASE) 50 MCG/ACT nasal spray Place 2 sprays into both nostrils daily as needed for allergies or rhinitis.    [provider]  [facility] FIBER-CAPS 625  MG tablet Take 625 mg by mouth daily.  05/03/18   [provider]  hyoscyamine (LEVBID) 0.375 MG 12 hr tablet TAKE (1) TABLET BYMOUTH EVERY TWELVE HOURS. 09/16/20   Gelene Mink, NP  ibuprofen (ADVIL) 800 MG tablet Take 800 mg by mouth every 8 (eight) hours as needed for mild pain or moderate pain. 03/05/20   [provider]  levETIRAcetam (KEPPRA) 250 MG tablet Take 2 tablets by mouth 2 (two) times daily.  11/09/14   [provider]  metFORMIN (GLUCOPHAGE) 500 MG tablet Take 500 mg by mouth 2 (two) times daily.    [provider]  nitrofurantoin, macrocrystal-monohydrate, (MACROBID) 100 MG capsule Take 1 capsule (100 mg total)  by mouth 2 (two) times daily. Patient not taking: No sig reported 08/06/20   Wurst, [place], PA-C  Ocrelizumab (OCREVUS IV) Inject into the vein. Every 6 months for MS    [provider]  pravastatin (PRAVACHOL) 20 MG tablet Take 20 mg by mouth every morning.     [provider]  pregabalin (LYRICA) 75 MG capsule 1 in the evening and 2 at bedtime    [provider]  VESICARE 10 MG tablet Take 1 tablet by mouth every morning.  11/09/14   [provider]    Allergies    Celebrex [celecoxib], Vioxx [rofecoxib], Zanaflex [tizanidine hcl], and Clindamycin  Review of Systems   Review of Systems  Constitutional: Positive for appetite change, chills, fatigue and fever.  HENT: Positive for congestion.   Eyes: Negative for visual disturbance.  Respiratory: Positive for cough and shortness of breath.   Cardiovascular: Positive for chest pain. Negative for palpitations.  Gastrointestinal: Negative for abdominal pain, diarrhea and vomiting.  Genitourinary: Negative for dysuria.  Musculoskeletal: Positive for myalgias.  Skin: Negative for rash.  Neurological: Positive for headaches.    Physical Exam Updated Vital Signs BP 140/78   Pulse (!) 122   Temp (!) 100.5 F (38.1 C) (Oral)   Resp (!) 28   Ht 6\' 3"  (1.905 m)   Wt 99.8 kg   SpO2 94%   BMI 27.50 kg/m   Physical Exam Vitals and nursing note reviewed.  Constitutional:      Appearance: Normal appearance.  HENT:     Head: Normocephalic.     Mouth/Throat:     Mouth: Mucous membranes are moist.  Cardiovascular:     Rate and Rhythm: Tachycardia present.  Pulmonary:     Effort: Pulmonary effort is normal. Tachypnea present. No respiratory distress.     Breath sounds: Examination of the right-middle field reveals wheezing. Examination of the left-middle field reveals wheezing. Examination of the right-lower field reveals decreased breath sounds. Examination of the left-lower field reveals decreased  breath sounds. Decreased breath sounds, wheezing and rales present.  Chest:     Chest wall: No tenderness or crepitus.  Abdominal:     Palpations: Abdomen is soft.     Tenderness: There is no abdominal tenderness. There is no rebound.  Musculoskeletal:     Comments: Chronically contracted and edematous extremity secondary to MS  Skin:    General: Skin is warm.  Neurological:     Mental Status: He is alert and oriented to person, place, and time. Mental status is at baseline.  Psychiatric:        Mood and Affect: Mood normal.     ED Results / Procedures / Treatments   Labs (all labs ordered are listed, but only abnormal results are displayed) Labs  Reviewed  CBC WITH DIFFERENTIAL/PLATELET  COMPREHENSIVE METABOLIC PANEL  TROPONIN I (HIGH SENSITIVITY)    EKG None  Radiology No results found.  Procedures .Critical Care Performed by: Rozelle Logan, DO Authorized by: Rozelle Logan, DO   Critical care provider statement:    Critical care time (minutes):  30   Critical care was time spent personally by me on the following activities:  Discussions with consultants, evaluation of patient's response to treatment, examination of patient, ordering and performing treatments and interventions, ordering and review of laboratory studies, ordering and review of radiographic studies, pulse oximetry, re-evaluation of patient's condition, obtaining history from patient or surrogate and review of old charts   I assumed direction of critical care for this patient from another provider in my specialty: no       Medications Ordered in ED Medications  sodium chloride 0.9 % bolus 500 mL (has no administration in time range)  acetaminophen (TYLENOL) tablet 1,000 mg (has no administration in time range)  albuterol (VENTOLIN HFA) 108 (90 Base) MCG/ACT inhaler 2 puff (has no administration in time range)  dexamethasone (DECADRON) injection 10 mg (has no administration in time range)    ED  Course  I have reviewed the triage vital signs and the nursing notes.  Pertinent labs & imaging results that were available during my care of the patient were reviewed by me and considered in my medical decision making (see chart for details).    MDM Rules/Calculators/A&P                          50 year old known Covid positive patient presents for shortness of breath and hypoxia.  Arrived on 2 L nasal cannula.  Febrile and tachycardic but with stable blood pressure.  Awake and mentating.  He has appropriate oxygenation on 2 L but continues to be tachypneic and ill-appearing.  EKG showed sinus tachycardia with no acute ischemic changes.  Blood work is reassuring with no acute abnormalities.  Chest x-ray showed COVID pneumonia, troponin was negative.  Tachycardia responded to fluids and antipyretic however with his increased work of breathing a CAT scan was done which showed no acute pulmonary embolism.  Patient has been treated symptomatically with steroids and albuterol inhaler, although improved he still is ill-appearing with tachypnea.  For this reason we will bring patient in for evaluation.  Patients evaluation and results requires admission for further treatment and care. Patient agrees with admission plan, offers no new complaints and is stable/unchanged at time of admit.  Final Clinical Impression(s) / ED Diagnoses Final diagnoses:  None    Rx / DC Orders ED Discharge Orders    None       Rozelle Logan, DO 11/17/20 1518

## 2020-11-18 DIAGNOSIS — G35 Multiple sclerosis: Secondary | ICD-10-CM | POA: Diagnosis not present

## 2020-11-18 DIAGNOSIS — U071 COVID-19: Secondary | ICD-10-CM | POA: Diagnosis not present

## 2020-11-18 DIAGNOSIS — N179 Acute kidney failure, unspecified: Secondary | ICD-10-CM

## 2020-11-18 DIAGNOSIS — J9601 Acute respiratory failure with hypoxia: Secondary | ICD-10-CM

## 2020-11-18 DIAGNOSIS — E871 Hypo-osmolality and hyponatremia: Secondary | ICD-10-CM

## 2020-11-18 DIAGNOSIS — R569 Unspecified convulsions: Secondary | ICD-10-CM

## 2020-11-18 LAB — COMPREHENSIVE METABOLIC PANEL
ALT: 34 U/L (ref 0–44)
AST: 35 U/L (ref 15–41)
Albumin: 3 g/dL — ABNORMAL LOW (ref 3.5–5.0)
Alkaline Phosphatase: 105 U/L (ref 38–126)
Anion gap: 12 (ref 5–15)
BUN: 18 mg/dL (ref 6–20)
CO2: 24 mmol/L (ref 22–32)
Calcium: 9.2 mg/dL (ref 8.9–10.3)
Chloride: 99 mmol/L (ref 98–111)
Creatinine, Ser: 0.89 mg/dL (ref 0.61–1.24)
GFR, Estimated: >60 mL/min (ref >60)
Glucose, Bld: 133 mg/dL — ABNORMAL HIGH (ref 70–99)
Potassium: 4.5 mmol/L (ref 3.5–5.1)
Sodium: 135 mmol/L (ref 135–145)
Total Bilirubin: 0.7 mg/dL (ref 0.3–1.2)
Total Protein: 7 g/dL (ref 6.5–8.1)

## 2020-11-18 LAB — CBC WITH DIFFERENTIAL/PLATELET
Abs Immature Granulocytes: 0.1 10*3/uL — ABNORMAL HIGH (ref 0.00–0.07)
Basophils Absolute: 0 10*3/uL (ref 0.0–0.1)
Basophils Relative: 0 %
Eosinophils Absolute: 0 10*3/uL (ref 0.0–0.5)
Eosinophils Relative: 0 %
HCT: 40.8 % (ref 39.0–52.0)
Hemoglobin: 12.9 g/dL — ABNORMAL LOW (ref 13.0–17.0)
Immature Granulocytes: 1 %
Lymphocytes Relative: 3 %
Lymphs Abs: 0.2 10*3/uL — ABNORMAL LOW (ref 0.7–4.0)
MCH: 26 pg (ref 26.0–34.0)
MCHC: 31.6 g/dL (ref 30.0–36.0)
MCV: 82.3 fL (ref 80.0–100.0)
Monocytes Absolute: 0.4 10*3/uL (ref 0.1–1.0)
Monocytes Relative: 6 %
Neutro Abs: 6.3 10*3/uL (ref 1.7–7.7)
Neutrophils Relative %: 90 %
Platelets: 311 10*3/uL (ref 150–400)
RBC: 4.96 MIL/uL (ref 4.22–5.81)
RDW: 14.6 % (ref 11.5–15.5)
WBC: 7 10*3/uL (ref 4.0–10.5)
nRBC: 0 % (ref 0.0–0.2)

## 2020-11-18 LAB — HIV ANTIBODY (ROUTINE TESTING W REFLEX): HIV Screen 4th Generation wRfx: NONREACTIVE

## 2020-11-18 LAB — FERRITIN: Ferritin: 424 ng/mL — ABNORMAL HIGH (ref 24–336)

## 2020-11-18 LAB — MAGNESIUM: Magnesium: 2.1 mg/dL (ref 1.7–2.4)

## 2020-11-18 LAB — PHOSPHORUS: Phosphorus: 2.6 mg/dL (ref 2.5–4.6)

## 2020-11-18 LAB — D-DIMER, QUANTITATIVE: D-Dimer, Quant: 0.88 ug/mL-FEU — ABNORMAL HIGH (ref 0.00–0.50)

## 2020-11-18 LAB — SARS CORONAVIRUS 2 (TAT 6-24 HRS): SARS Coronavirus 2: POSITIVE — AB

## 2020-11-18 LAB — C-REACTIVE PROTEIN: CRP: 20.2 mg/dL — ABNORMAL HIGH (ref <1.0)

## 2020-11-18 MED ORDER — POLYETHYLENE GLYCOL 3350 17 G PO PACK
17.0000 g | PACK | Freq: Two times a day (BID) | ORAL | Status: DC
  Administered 2020-11-18: 17 g via ORAL
  Filled 2020-11-18 (×2): qty 1

## 2020-11-18 MED ORDER — METHYLPREDNISOLONE SODIUM SUCC 125 MG IJ SOLR
50.0000 mg | Freq: Two times a day (BID) | INTRAMUSCULAR | Status: DC
  Administered 2020-11-18 – 2020-11-19 (×3): 50 mg via INTRAVENOUS
  Filled 2020-11-18 (×3): qty 2

## 2020-11-18 MED ORDER — BARICITINIB 2 MG PO TABS
4.0000 mg | ORAL_TABLET | Freq: Every day | ORAL | Status: DC
  Administered 2020-11-18 – 2020-11-19 (×2): 4 mg via ORAL
  Filled 2020-11-18 (×2): qty 2

## 2020-11-18 MED ORDER — IPRATROPIUM-ALBUTEROL 20-100 MCG/ACT IN AERS
1.0000 | INHALATION_SPRAY | Freq: Four times a day (QID) | RESPIRATORY_TRACT | Status: DC
  Administered 2020-11-18 – 2020-11-19 (×2): 1 via RESPIRATORY_TRACT
  Filled 2020-11-18: qty 4

## 2020-11-18 MED ORDER — ALBUTEROL SULFATE HFA 108 (90 BASE) MCG/ACT IN AERS: 1.0000 | INHALATION_SPRAY | RESPIRATORY_TRACT | Status: DC | PRN

## 2020-11-18 NOTE — Progress Notes (Addendum)
PROGRESS NOTE    Vincent Anthony  YQM:[id number] DOB: [birth year] DOA: 11/17/2020 PCP: [name], MD    Brief Narrative:  Mr. Vincent Anthony was admitted to the hospital with a working diagnosis of acute hypoxic respiratory failure due to SARS COVID-19 viral pneumonia.  50 year old male with past medical history for bedbound due to multiple sclerosis who tested positive for COVID-19 on 11/11/20.  His symptoms were consistent with cough and dyspnea along with mild chest pain.  On the day of hospitalization his oximetry was in the high 80s low 90s and he was transported to the hospital. He has been vaccinated and boosted for COVID-19.  On his initial physical examination his temperature was 100.5 F, heart rate 123, respiratory 29-45, blood pressure 112-140 systolic, oximetry 92 to 95% on 2 L per nasal cannula.  Lungs with no wheezing or rhonchi, heart S1-S2, present, tachycardic, soft abdomen, right lower extremity nonpitting edema.  Left facial droop and right-sided hemiparesis.  Sodium 132, potassium 4.4, chloride 97, bicarb 25, glucose 129, BUN 14, creatinine 1.16, white count 11.0, hemoglobin 14.4, hematocrit 46.3, platelets 288. SARS COVID-19 positive.  Chest radiograph with bilateral interstitial infiltrates, lower lobes and right upper lobe. CT chest, no central pulmonary embolism, extensive multifocal patchy infiltrates lower lobes and right upper lobe.  EKG 122 bpm, left axis deviation, normal intervals, sinus rhythm, no ST segment T wave changes, low voltage.  Assessment & Plan:   Principal Problem:   Pneumonia due to COVID-19 virus Active Problems:   MS (multiple sclerosis) (HCC)   Seizures (HCC)   Hyponatremia   AKI (acute kidney injury) (HCC)   Acute respiratory failure with hypoxia (HCC)   1. Acute hypoxemic respiratory failure due to SARS COVID 19 viral pneumonia.   RR: 22  Pulse oxymetry: 94% Fi02: 2L/min  COVID-19 Labs  Recent Labs    11/17/20 1341  11/17/20 1747 11/18/20 0429  DDIMER  --  1.51* 0.88*  FERRITIN 335  --  424*  LDH 216*  --   --   CRP 18.3*  --  20.2*    Lab Results  Component Value Date   SARSCOV2NAA NEGATIVE 10/30/2020    Patient with worsening inflammatory markers, extensive bilateral infiltrates.  High risk of worsening respiratory failure, will add baricitinib per protocol, continue with systemic steroids with methylprednisolone and remdesivir.  Bronchodilator therapy, antitussive agents and airway clearing techniques.  Continue to follow inflammatory markers and oxygenation, keep oxygen saturation more than 92%.   2. Multiple sclerosis. Patient with non ambulatory, right sided hemiparesis. Continue supportive medical care, as needed muscle relaxant with baclofen.   3. Hyponatremia and AKI. Renal function with serum cr down to 089 with K at 4,5 and serum bicarbonate at 24. Na up to 135. Continue close follow of renal function and electrolytes, hold on IV fluids in the setting of acute viral pneumonia.   4. Depression/ anxiety. Continue with clonazepam bid.   5. Seizures. Continue with keppra, clinically controlled.   Patient continue to be at high risk for worsening respiratory failure   Status is: Inpatient  Remains inpatient appropriate because:IV treatments appropriate due to intensity of illness or inability to take PO   Dispo: The patient is from: SNF              Anticipated d/c is to: SNF              Anticipated d/c date is: > 3 days  Patient currently is not medically stable to d/c.   Difficult to place patient No    DVT prophylaxis: Enoxaparin   Code Status:   full  Family Communication:  I was unable to reach his wife for the phone.   Subjective: Patient continue to feel fatigue and poor oral intake, positive dyspnea but not cough, no nausea or vomiting,   Objective: Vitals:   11/17/20 2100 11/17/20 2330 11/18/20 0048 11/18/20 0554  BP: 129/82 130/79 109/82 113/78   Pulse: 96 89 93 92  Resp: (!) 29 (!) 28 (!) 24 (!) 22  Temp:   97.7 F (36.5 C) 98.3 F (36.8 C)  TempSrc:   Oral Oral  SpO2: 94% 94% 94% 94%  Weight:      Height:        Intake/Output Summary (Last 24 hours) at 11/18/2020 1112 Last data filed at 11/17/2020 1825 Gross per 24 hour  Intake 700 ml  Output -  Net 700 ml   Filed Weights   11/17/20 1101  Weight: 99.8 kg    Examination:   General: Not in pain or dyspnea, deconditioned  Neurology: Awake left facial droop and right sided hemiparesis   E ENT: mild pallor, no icterus, oral mucosa moist Cardiovascular: No JVD. S1-S2 present, rhythmic, no gallops, rubs, or murmurs. No lower extremity edema. Pulmonary: positive breath sounds bilaterally, scattered rales  Gastrointestinal. Abdomen soft and non tender, positive distention  Skin. No rashes Musculoskeletal: no joint deformities     Data Reviewed: I have personally reviewed following labs and imaging studies  CBC: Recent Labs  Lab 11/17/20 1136 11/18/20 0429  WBC 11.0* 7.0  NEUTROABS 10.1* 6.3  HGB 14.4 12.9*  HCT 46.3 40.8  MCV 84.6 82.3  PLT 288 311   Basic Metabolic Panel: Recent Labs  Lab 11/17/20 1136 11/18/20 0429  NA 132* 135  K 4.4 4.5  CL 97* 99  CO2 25 24  GLUCOSE 129* 133*  BUN 14 18  CREATININE 1.16 0.89  CALCIUM 9.3 9.2  MG  --  2.1  PHOS  --  2.6   GFR: Estimated Creatinine Clearance: 118.7 mL/min (by C-G formula based on SCr of 0.89 mg/dL). Liver Function Tests: Recent Labs  Lab 11/17/20 1136 11/18/20 0429  AST 45* 35  ALT 39 34  ALKPHOS 117 105  BILITOT 0.6 0.7  PROT 7.9 7.0  ALBUMIN 3.9 3.0*   No results for input(s): LIPASE, AMYLASE in the last 168 hours. No results for input(s): AMMONIA in the last 168 hours. Coagulation Profile: No results for input(s): INR, PROTIME in the last 168 hours. Cardiac Enzymes: No results for input(s): CKTOTAL, CKMB, CKMBINDEX, TROPONINI in the last 168 hours. BNP (last 3 results) No  results for input(s): PROBNP in the last 8760 hours. HbA1C: No results for input(s): HGBA1C in the last 72 hours. CBG: No results for input(s): GLUCAP in the last 168 hours. Lipid Profile: Recent Labs    11/17/20 1341  TRIG 163*   Thyroid Function Tests: No results for input(s): TSH, T4TOTAL, FREET4, T3FREE, THYROIDAB in the last 72 hours. Anemia Panel: Recent Labs    11/17/20 1341 11/18/20 0429  FERRITIN 335 424*      Radiology Studies: I have reviewed all of the imaging during this hospital visit personally     Scheduled Meds: . vitamin C  500 mg Oral Daily  . baclofen  10 mg Oral TID  . clonazePAM  1 mg Oral BID  . enoxaparin (LOVENOX) injection  40 mg Subcutaneous Q24H  . hyoscyamine  0.375 mg Oral Q12H  . levETIRAcetam  250 mg Oral BID  . loratadine  10 mg Oral Daily  . methylPREDNISolone (SOLU-MEDROL) injection  0.5 mg/kg Intravenous Q12H   Followed by  . [START ON 11/21/2020] predniSONE  50 mg Oral Daily  . zinc sulfate  220 mg Oral Daily   Continuous Infusions: . remdesivir 100 mg in NS 100 mL 100 mg (11/18/20 1039)     LOS: 1 day        Mauricio Annett Gula, MD

## 2020-11-18 NOTE — Progress Notes (Signed)
Bp 133/94 MD notified, no new orders at this time. Will continue to monitor.

## 2020-11-19 DIAGNOSIS — J1282 Pneumonia due to coronavirus disease 2019: Secondary | ICD-10-CM | POA: Diagnosis not present

## 2020-11-19 DIAGNOSIS — U071 COVID-19: Secondary | ICD-10-CM | POA: Diagnosis not present

## 2020-11-19 DIAGNOSIS — N179 Acute kidney failure, unspecified: Secondary | ICD-10-CM | POA: Diagnosis not present

## 2020-11-19 DIAGNOSIS — J9601 Acute respiratory failure with hypoxia: Secondary | ICD-10-CM | POA: Diagnosis not present

## 2020-11-19 LAB — COMPREHENSIVE METABOLIC PANEL
ALT: 39 U/L (ref 0–44)
AST: 38 U/L (ref 15–41)
Albumin: 3.1 g/dL — ABNORMAL LOW (ref 3.5–5.0)
Alkaline Phosphatase: 93 U/L (ref 38–126)
Anion gap: 10 (ref 5–15)
BUN: 26 mg/dL — ABNORMAL HIGH (ref 6–20)
CO2: 26 mmol/L (ref 22–32)
Calcium: 9.2 mg/dL (ref 8.9–10.3)
Chloride: 100 mmol/L (ref 98–111)
Creatinine, Ser: 0.89 mg/dL (ref 0.61–1.24)
GFR, Estimated: >60 mL/min (ref >60)
Glucose, Bld: 126 mg/dL — ABNORMAL HIGH (ref 70–99)
Potassium: 4.7 mmol/L (ref 3.5–5.1)
Sodium: 136 mmol/L (ref 135–145)
Total Bilirubin: 0.5 mg/dL (ref 0.3–1.2)
Total Protein: 7 g/dL (ref 6.5–8.1)

## 2020-11-19 LAB — D-DIMER, QUANTITATIVE: D-Dimer, Quant: 0.59 ug/mL-FEU — ABNORMAL HIGH (ref 0.00–0.50)

## 2020-11-19 LAB — FERRITIN: Ferritin: 539 ng/mL — ABNORMAL HIGH (ref 24–336)

## 2020-11-19 LAB — C-REACTIVE PROTEIN: CRP: 8.5 mg/dL — ABNORMAL HIGH (ref <1.0)

## 2020-11-19 MED ORDER — IPRATROPIUM-ALBUTEROL 20-100 MCG/ACT IN AERS: 1.0000 | INHALATION_SPRAY | Freq: Two times a day (BID) | RESPIRATORY_TRACT | Status: DC

## 2020-11-19 MED ORDER — LINACLOTIDE 145 MCG PO CAPS: 145.0000 ug | ORAL_CAPSULE | Freq: Every day | ORAL | Status: DC

## 2020-11-19 MED ORDER — PREDNISONE 20 MG PO TABS: 40.0000 mg | ORAL_TABLET | Freq: Every day | ORAL | 0 refills | Status: AC

## 2020-11-19 NOTE — Evaluation (Addendum)
Physical Therapy Evaluation Patient Details Name: Vincent Anthony MRN: [id number] DOB: 1971/05/07 Today's Date: 11/19/2020   History of Present Illness  Vincent Anthony is a 50 y.o. male with medical history significant for multiple sclerosis, presents from nursing home with reports of positive covid test on 2/14, O2 sats in the high 80s to low 90s,  difficulty breathing and cough, mild mid central chest pain and denies vomiting or loose stools.  Clinical Impression  Pt admitted with above diagnosis. Pt from SNF for strengthening prior to return home, pt's plan is to return to SNF for strengthening. Pt reports using slideboard to get into WC and hoyer lift to return to bed, practicing pull to stand in parallel bars but hasn't been able to take steps. Eval limited due to pt reporting bowel movement urge and no R AFO so pt would prefer to remain supine with HOB elevated. Pt requires max A to position BLE with heels floated and RLE out of external rotation and into more neutral position. Strength and AROM assessed while pt supine. Recommend OT consult for UE strengthening. Pt currently with functional limitations due to the deficits listed below (see PT Problem List). Pt will benefit from skilled PT to increase their independence and safety with mobility to allow discharge to the venue listed below.       Follow Up Recommendations SNF    Equipment Recommendations  None recommended by PT    Recommendations for Other Services OT consult     Precautions / Restrictions Precautions Precautions: Fall Required Braces or Orthoses: Other Brace Other Brace: AFO RLE Restrictions Weight Bearing Restrictions: No      Mobility  Bed Mobility    General bed mobility comments: pt declines supine<>sit transfer due to "I feel like I need to have a bowel movement", total A to position R LE into more neutral posture with decreased external rotation and floating bil heels    Transfers  General  transfer comment: pt declines  Ambulation/Gait   Stairs            Wheelchair Mobility    Modified Rankin (Stroke Patients Only)       Balance Overall balance assessment: History of Falls        Pertinent Vitals/Pain Pain Assessment: No/denies pain    Home Living Family/patient expects to be discharged to:: Skilled nursing facility                      Prior Function           Comments: Pt reports recently at SNF for rehab, completes slideboard transfers into [facility] and hoyer lift transfers back into bed. Pt reports was working on standing in parallel bars and taking steps.     Hand Dominance        Extremity/Trunk Assessment   Upper Extremity Assessment Upper Extremity Assessment: RUE deficits/detail;LUE deficits/detail RUE Deficits / Details: grossly -3/5, mild contractures right hand limiting finger extension for fingers 3-5, weak grip RUE Sensation: WNL RUE Coordination: decreased fine motor;decreased gross motor LUE Deficits / Details: grossly 4/5 LUE Sensation: WNL    Lower Extremity Assessment Lower Extremity Assessment: RLE deficits/detail;LLE deficits/detail RLE Deficits / Details: knee strength 2-/5 and AROM ~25%; ankle strength 0/5 RLE Sensation: WNL LLE Deficits / Details: knee strength 2/5 and AROM ~50%; ankle strength 2-/5 and AROM unable to achieve neutral LLE Sensation: WNL       Communication   Communication: No difficulties  Cognition Arousal/Alertness: Awake/alert  Behavior During Therapy: WFL for tasks assessed/performed;Flat affect Overall Cognitive Status: Within Functional Limits for tasks assessed     General Comments      Exercises     Assessment/Plan    PT Assessment Patient needs continued PT services  PT Problem List Decreased strength;Decreased range of motion;Decreased activity tolerance;Decreased balance;Decreased mobility;Impaired tone       PT Treatment Interventions DME instruction;Gait  training;Functional mobility training;Therapeutic activities;Therapeutic exercise;Balance training;Neuromuscular re-education;Patient/family education    PT Goals (Current goals can be found in the Care Plan section)  Acute Rehab PT Goals Patient Stated Goal: back to rehab to get stronger PT Goal Formulation: With patient Time For Goal Achievement: 12/03/20 Potential to Achieve Goals: Fair    Frequency Min 2X/week   Barriers to discharge        Co-evaluation               AM-PAC PT "6 Clicks" Mobility  Outcome Measure Help needed turning from your back to your side while in a flat bed without using bedrails?: A Lot Help needed moving from lying on your back to sitting on the side of a flat bed without using bedrails?: Total Help needed moving to and from a bed to a chair (including a wheelchair)?: Total Help needed standing up from a chair using your arms (e.g., wheelchair or bedside chair)?: Total Help needed to walk in hospital room?: Total Help needed climbing 3-5 steps with a railing? : Total 6 Click Score: 7    End of Session   Activity Tolerance: Patient tolerated treatment well Patient left: in bed;with call bell/phone within reach Nurse Communication: Mobility status;Other (comment) (possible BM) PT Visit Diagnosis: Unsteadiness on feet (R26.81);Other abnormalities of gait and mobility (R26.89);Muscle weakness (generalized) (M62.81)    Time: 1696-7893 PT Time Calculation (min) (ACUTE ONLY): 15 min   Charges:   PT Evaluation $PT Eval Low Complexity: 1 Low           Tori Broussard PT, DPT 11/19/20, 2:02 PM

## 2020-11-19 NOTE — TOC Transition Note (Signed)
Transition of Care [facility]) - CM/SW Discharge Note   Patient Details  Name: Vincent Anthony MRN: [id number] Date of Birth: May 17, 1971  [facility] CM/SW Contact:  Annice Needy, LCSW Phone Number: 11/19/2020, 4:21 PM   Clinical Narrative:    Discharge clinicals sent to the facility. Spouse notified of discharge. RN to call report and having nursing administrative assistant call EMS when patient is ready.    Final next level of care: Skilled Nursing Facility Barriers to Discharge: No Barriers Identified   Patient Goals and CMS Choice Patient states their goals for this hospitalization and ongoing recovery are:: return to SNF      Discharge Placement                  Name of family member notified: Mrs. Hobdy Patient and family notified of of transfer: 11/19/20  Discharge Plan and Services                                     Social Determinants of Health (SDOH) Interventions     Readmission Risk Interventions No flowsheet data found.

## 2020-11-19 NOTE — [place] FL2 (Signed)
[place] MEDICAID FL2 LEVEL OF CARE SCREENING TOOL     IDENTIFICATION  Patient Name: Vincent Anthony Birthdate: 03-11-1971 Sex: male Admission Date (Current Location): 11/17/2020  [place] and [place] Number:  [employer] and Address:  [facility],  [address], [place] [address]      Provider Number: [id number]  Attending Physician Name and Address:  Erick Blinks, MD  Relative Name and Phone Number:  Jaysun Wessels (wife) Ph: [phone]    Current Level of Care: Hospital Recommended Level of Care: Skilled Nursing Facility Prior Approval Number:    Date Approved/Denied:   PASRR Number:    Discharge Plan: SNF    Current Diagnoses: Patient Active Problem List   Diagnosis Date Noted  . Seizures (HCC) 11/18/2020  . Hyponatremia 11/18/2020  . AKI (acute kidney injury) (HCC) 11/18/2020  . Acute respiratory failure with hypoxia (HCC) 11/18/2020  . Pneumonia due to COVID-19 virus 11/17/2020  . MS (multiple sclerosis) (HCC) 03/08/2018  . 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 History of urinary stone 02/03/2018  . Urge incontinence of urine 02/03/2018  . Abdominal pain 01/05/2017  . Elevated LFTs 11/05/2016  . Neuropathic pain 05/23/2015  . Arthralgia of both knees 05/23/2015  . Urinary tract stones 01/03/2015  . Chronic pain syndrome 03/20/2014  . 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 Bilateral knee pain 01/29/2013  . 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 Ongoing use of possibly toxic medication 10/11/2012  . Acne 01/13/2012  . Seborrheic dermatitis 01/13/2012  . Neurogenic bladder 11/13/2011  . Urinary urgency 11/13/2011    Orientation RESPIRATION BLADDER Height & Weight     Self,Time,Situation,Place  Normal Continent Weight: 220 lb 0.3 oz (99.8 kg) Height:  6\' 3"  (190.5 cm)  BEHAVIORAL SYMPTOMS/MOOD NEUROLOGICAL BOWEL NUTRITION STATUS      Continent Diet (regular)  AMBULATORY STATUS COMMUNICATION OF NEEDS Skin    Total Care Verbally Normal                       Personal Care Assistance Level of Assistance  Bathing,Feeding,Dressing Bathing Assistance: Maximum assistance Feeding assistance: Limited assistance Dressing Assistance: Maximum assistance     Functional Limitations Info  Sight,Hearing,Speech   Hearing Info: Adequate Speech Info: Adequate    SPECIAL CARE FACTORS FREQUENCY  PT (By licensed PT)     PT Frequency: 5x/week              Contractures Contractures Info: Present    Additional Factors Info  Isolation Precautions Code Status Info: full Allergies Info: celebrex, vioxx, zanaflex, clindamycin     Isolation Precautions Info: COVID+ 11/17/20     Current Medications (11/19/2020):  This is the current hospital active medication list Current Facility-Administered Medications  Medication Dose Route Frequency Provider Last Rate Last Admin  . acetaminophen (TYLENOL) tablet 650 mg  650 mg Oral Q6H PRN Emokpae, Ejiroghene E, MD      . albuterol (VENTOLIN HFA) 108 (90 Base) MCG/ACT inhaler 1-2 puff  1-2 puff Inhalation Q4H PRN Arrien, 11/21/2020, MD      . ascorbic acid (VITAMIN C) tablet 500 mg  500 mg Oral Daily Emokpae, Ejiroghene E, MD   500 mg at 11/19/20 0936  . baclofen (LIORESAL) tablet 10 mg  10 mg Oral TID Emokpae, Ejiroghene E, MD   10 mg at 11/19/20 0936  . baricitinib (OLUMIANT) tablet 4 mg  4 mg Oral Daily Arrien, 11/21/20, MD   4 mg at 11/19/20 0935  .  clonazePAM (KLONOPIN) tablet 1 mg  1 mg Oral BID Emokpae, Ejiroghene E, MD   1 mg at 11/19/20 0935  . enoxaparin (LOVENOX) injection 40 mg  40 mg Subcutaneous Q24H Emokpae, Ejiroghene E, MD   40 mg at 11/18/20 1744  . guaiFENesin-dextromethorphan (ROBITUSSIN DM) 100-10 MG/5ML syrup 10 mL  10 mL Oral Q4H PRN Emokpae, Ejiroghene E, MD      . hyoscyamine (LEVBID) 0.375 MG 12 hr tablet 0.375 mg  0.375 mg Oral Q12H Emokpae, Ejiroghene E, MD   0.375 mg at 11/19/20 0945  . Ipratropium-Albuterol  (COMBIVENT) respimat 1 puff  1 puff Inhalation BID Erick Blinks, MD      . levETIRAcetam (KEPPRA) tablet 250 mg  250 mg Oral BID Emokpae, Ejiroghene E, MD   250 mg at 11/19/20 0935  . loratadine (CLARITIN) tablet 10 mg  10 mg Oral Daily Emokpae, Ejiroghene E, MD   10 mg at 11/19/20 0935  . methylPREDNISolone sodium succinate (SOLU-MEDROL) 125 mg/2 mL injection 50 mg  50 mg Intravenous Q12H Arrien, York Ram, MD   50 mg at 11/19/20 0226  . ondansetron (ZOFRAN) tablet 4 mg  4 mg Oral Q6H PRN Emokpae, Ejiroghene E, MD       Or  . ondansetron (ZOFRAN) injection 4 mg  4 mg Intravenous Q6H PRN Emokpae, Ejiroghene E, MD      . polyethylene glycol (MIRALAX / GLYCOLAX) packet 17 g  17 g Oral BID Coralie Keens, MD   17 g at 11/18/20 1555  . remdesivir 100 mg in sodium chloride 0.9 % 100 mL IVPB  100 mg Intravenous Daily Emokpae, Ejiroghene E, MD 200 mL/hr at 11/19/20 0943 100 mg at 11/19/20 0943  . zinc sulfate capsule 220 mg  220 mg Oral Daily Emokpae, Ejiroghene E, MD   220 mg at 11/19/20 8099     Discharge Medications: Please see discharge summary for a list of discharge medications.  Relevant Imaging Results:  Relevant Lab Results:   Additional Information SSN: [id number]  Annice Needy, LCSW

## 2020-11-19 NOTE — TOC Initial Note (Signed)
Transition of Care [facility]) - Initial/Assessment Note    Patient Details  Name: Vincent Anthony MRN: [id number] Date of Birth: November 21, 1970  Transition of Care [facility]) CM/SW Contact:    Annice Needy, LCSW Phone Number: 11/19/2020, 2:13 PM  Clinical Narrative:                 Patient admitted to [facility] two weeks ago for rehab. May turn in to LTC. Can return. Needs insurance authorization to return. Insurance auth started.   Expected Discharge Plan: Skilled Nursing Facility Barriers to Discharge: Insurance Authorization   Patient Goals and CMS Choice Patient states their goals for this hospitalization and ongoing recovery are:: return to SNF      Expected Discharge Plan and Services Expected Discharge Plan: Skilled Nursing Facility       Living arrangements for the past 2 months: Single Family Home,Skilled Nursing Facility Expected Discharge Date: 11/19/20                                    Prior Living Arrangements/Services Living arrangements for the past 2 months: Single Family Home,Skilled Nursing Facility Lives with:: Spouse,Facility Resident              Current home services: DME    Activities of Daily Living Home Assistive Devices/Equipment: Wheelchair ADL Screening (condition at time of admission) Patient's cognitive ability adequate to safely complete daily activities?: Yes Is the patient deaf or have difficulty hearing?: No Does the patient have difficulty seeing, even when wearing glasses/contacts?: No Does the patient have difficulty concentrating, remembering, or making decisions?: No Patient able to express need for assistance with ADLs?: Yes Does the patient have difficulty dressing or bathing?: Yes Independently performs ADLs?: No Communication: Independent Dressing (OT): Dependent Is this a change from baseline?: Pre-admission baseline Grooming: Dependent Is this a change from baseline?: Pre-admission baseline Feeding: Dependent Is  this a change from baseline?: Pre-admission baseline Bathing: Dependent Is this a change from baseline?: Pre-admission baseline Toileting: Dependent Is this a change from baseline?: Pre-admission baseline In/Out Bed: Dependent Is this a change from baseline?: Pre-admission baseline Walks in Home: Dependent Is this a change from baseline?: Pre-admission baseline Does the patient have difficulty walking or climbing stairs?: Yes Weakness of Legs: Both Weakness of Arms/Hands: Both  Permission Sought/Granted                  Emotional Assessment              Admission diagnosis:  Hypoxia [R09.02] Pneumonia due to COVID-19 virus [U07.1, J12.82] COVID-19 [U07.1] Patient Active Problem List   Diagnosis Date Noted  . Seizures (HCC) 11/18/2020  . Hyponatremia 11/18/2020  . AKI (acute kidney injury) (HCC) 11/18/2020  . Acute respiratory failure with hypoxia (HCC) 11/18/2020  . Pneumonia due to COVID-19 virus 11/17/2020  . MS (multiple sclerosis) (HCC) 03/08/2018  . 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 History of urinary stone 02/03/2018  . Urge incontinence of urine 02/03/2018  . Abdominal pain 01/05/2017  . Elevated LFTs 11/05/2016  . Neuropathic pain 05/23/2015  . Arthralgia of both knees 05/23/2015  . Urinary tract stones 01/03/2015  . Chronic pain syndrome 03/20/2014  . 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 Bilateral knee pain 01/29/2013  . 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 Ongoing use of possibly toxic medication 10/11/2012  . Acne 01/13/2012  . Seborrheic dermatitis 01/13/2012  . Neurogenic bladder 11/13/2011  .  Urinary urgency 11/13/2011   PCP:  [name], MD Pharmacy:   [facility] [place], [place] - [address] [facility] [address] [place] [address] Phone: [phone] Fax: [phone]     Social Determinants of Health (SDOH) Interventions    Readmission Risk Interventions No flowsheet  data found.

## 2020-11-19 NOTE — Discharge Summary (Signed)
Physician Discharge Summary  NEILSON OEHLERT ZOX:[id number] DOB: 27-Jan-1971 DOA: 11/17/2020  PCP: [name], MD  Admit date: 11/17/2020 Discharge date: 11/19/2020  Admitted From: SNF Disposition:  SNF  Recommendations for Outpatient Follow-up:  1. Follow up with PCP in 1-2 weeks 2. Please obtain BMP/CBC in one week 3. Consider condom catheter for urinary incontinence  Discharge Condition:stable CODE STATUS:full code Diet recommendation: heart healthy  Brief/Interim Summary: Mr. Deavers was admitted to the hospital with a working diagnosis of acute hypoxic respiratory failure due to SARS COVID-19 viral pneumonia.  50 year old male with past medical history for bedbound due to multiple sclerosis who tested positive for COVID-19 on 11/11/20.  His symptoms were consistent with cough and dyspnea along with mild chest pain.  On the day of hospitalization his oximetry was in the high 80s low 90s and he was transported to the hospital. He has been vaccinated and boosted for COVID-19.  On his initial physical examination his temperature was 100.5 F, heart rate 123, respiratory 29-45, blood pressure 112-140 systolic, oximetry 92 to 95% on 2 L per nasal cannula.  Lungs with no wheezing or rhonchi, heart S1-S2, present, tachycardic, soft abdomen, right lower extremity nonpitting edema.  Left facial droop and right-sided hemiparesis.  Sodium 132, potassium 4.4, chloride 97, bicarb 25, glucose 129, BUN 14, creatinine 1.16, white count 11.0, hemoglobin 14.4, hematocrit 46.3, platelets 288. SARS COVID-19 positive.  Chest radiograph with bilateral interstitial infiltrates, lower lobes and right upper lobe. CT chest, no central pulmonary embolism, extensive multifocal patchy infiltrates lower lobes and right upper lobe.  EKG 122 bpm, left axis deviation, normal intervals, sinus rhythm, no ST segment T wave changes, low voltage.  Discharge Diagnoses:  Principal Problem:   Pneumonia due to  COVID-19 virus Active Problems:   MS (multiple sclerosis) (HCC)   Seizures (HCC)   Hyponatremia   AKI (acute kidney injury) (HCC)   Acute respiratory failure with hypoxia (HCC)  1.  Acute respiratory failure with hypoxia secondary to COVID-19 pneumonia -Patient treated with steroids, baricitinib and remdesivir -Overall inflammatory markers have trended down -He will be continued on a course of prednisone to complete his course -He has been weaned off of oxygen and now breathing comfortably on room air  2.  Multiple sclerosis. -Right hemiparesis -Continue muscle relaxants, supportive care, ocrevus  3.  Seizure disorder. -Continue on Keppra  4.  Anxiety.  Continue on Klonopin  5.  Constipation. -Started on Linzess -Would use fleets enema every 48 hours as needed if he does not have any bowel movements  Discharge Instructions  Discharge Instructions    Diet - low sodium heart healthy   Complete by: As directed    Increase activity slowly   Complete by: As directed      Allergies as of 11/19/2020      Reactions   Celebrex [celecoxib] Nausea And Vomiting   Vioxx [rofecoxib] Nausea And Vomiting   Zanaflex [tizanidine Hcl] Nausea And Vomiting   Clindamycin Diarrhea      Medication List    STOP taking these medications   ibuprofen 800 MG tablet Commonly known as: ADVIL   nitrofurantoin (macrocrystal-monohydrate) 100 MG capsule Commonly known as: MACROBID     TAKE these medications   acetaminophen 325 MG tablet Commonly known as: TYLENOL Take 650 mg by mouth every 6 (six) hours as needed.   amitriptyline 10 MG tablet Commonly known as: ELAVIL Take 1 tablet by mouth at bedtime.   baclofen 10 MG tablet Commonly known as:  LIORESAL Take 1 tablet by mouth 3 (three) times daily.   cetirizine 10 MG tablet Commonly known as: ZYRTEC Take 10 mg by mouth daily.   clonazePAM 1 MG tablet Commonly known as: KLONOPIN Take 1 tablet (1 mg total) by mouth 2 (two) times  daily. Or as needed   Co Q-10 100 MG Caps Take 1 capsule by mouth daily.   cyclobenzaprine 5 MG tablet Commonly known as: FLEXERIL Take 5 mg by mouth 3 (three) times daily as needed.   fluticasone 50 MCG/ACT nasal spray Commonly known as: FLONASE Place 2 sprays into both nostrils daily as needed for allergies or rhinitis.   GNP Fiber-Caps 625 MG tablet Generic drug: polycarbophil Take 625 mg by mouth daily.   hyoscyamine 0.375 MG 12 hr tablet Commonly known as: LEVBID TAKE (1) TABLET BYMOUTH EVERY TWELVE HOURS.   levETIRAcetam 250 MG tablet Commonly known as: KEPPRA Take 1 tablet by mouth 2 (two) times daily.   linaclotide 145 MCG Caps capsule Commonly known as: LINZESS Take 1 capsule (145 mcg total) by mouth daily before breakfast.   metFORMIN 500 MG tablet Commonly known as: GLUCOPHAGE Take 500 mg by mouth 2 (two) times daily.   OCREVUS IV Inject into the vein. Every 6 months for MS   oxybutynin 10 MG 24 hr tablet Commonly known as: DITROPAN-XL Take 10 mg by mouth daily.   pravastatin 20 MG tablet Commonly known as: PRAVACHOL Take 20 mg by mouth every morning.   predniSONE 20 MG tablet Commonly known as: DELTASONE Take 2 tablets (40 mg total) by mouth daily for 8 days.   pregabalin 75 MG capsule Commonly known as: LYRICA Take 75 mg by mouth See admin instructions. 1 capsule in morning and 2 capsules at bedtime   sodium phosphate 7-19 GM/118ML Enem Place 1 enema rectally daily as needed for severe constipation.   VESIcare 10 MG tablet Generic drug: solifenacin Take 1 tablet by mouth every morning.   Vitamin D3 50 MCG (2000 UT) Tabs Take 2,000 Units by mouth in the morning.       Allergies  Allergen Reactions  . Celebrex [Celecoxib] Nausea And Vomiting  . Vioxx [Rofecoxib] Nausea And Vomiting  . Zanaflex [Tizanidine Hcl] Nausea And Vomiting  . Clindamycin Diarrhea    Consultations:     Procedures/Studies: CT Angio Chest PE W/Cm &/Or Wo  Cm  Result Date: 11/17/2020 CLINICAL DATA:  COVID-19 positive patient with shortness of breath. EXAM: CT ANGIOGRAPHY CHEST WITH CONTRAST TECHNIQUE: Multidetector CT imaging of the chest was performed using the standard protocol during bolus administration of intravenous contrast. Multiplanar CT image reconstructions and MIPs were obtained to evaluate the vascular anatomy. CONTRAST:  75 mL OMNIPAQUE IOHEXOL 350 MG/ML SOLN COMPARISON:  Single-view of the chest earlier today and PA and lateral chest 06/06/2018. FINDINGS: Cardiovascular: The study is limited by bolus timing and respiratory motion. No central obstructing embolus is seen. No pericardial effusion. Heart size is upper normal. Mediastinum/Nodes: No enlarged mediastinal, hilar, or axillary lymph nodes. Thyroid gland, trachea, and esophagus demonstrate no significant findings. Lungs/Pleura: Extensive multifocal airspace disease has an appearance consistent with COVID-19 pneumonia. No pleural effusion. Upper Abdomen: Negative. Musculoskeletal: No acute or focal abnormality. Review of the MIP images confirms the above findings. IMPRESSION: The examination is limited but no central pulmonary embolus is identified. Extensive multifocal airspace disease has an appearance consistent with COVID-19 pneumonia. Electronically Signed   By: Drusilla Kanner M.D.   On: 11/17/2020 14:13   MR ANGIO HEAD  WO CONTRAST  Result Date: 10/29/2020 EXAM: MRI HEAD WITHOUT AND WITH CONTRAST MRI CERVICAL SPINE WITHOUT AND WITH CONTRAST MRA HEAD WITHOUT CONTRAST TECHNIQUE: Multiplanar, multiecho pulse sequences of the brain and surrounding structures, and cervical spine, to include the craniocervical junction and cervicothoracic junction, were obtained without and with intravenous contrast. Additional angiographic images of the intracranial circulation and circle-of-Willis were performed without the administration of IV contrast. CONTRAST:  10mL GADAVIST GADOBUTROL 1 MMOL/ML IV SOLN  COMPARISON:  None. FINDINGS: MRI HEAD FINDINGS Brain: Cerebral volume within normal limits for age. Patchy multifocal foci of T2/FLAIR hyperintensity seen involving the periventricular, deep, in juxta cortical white matter of both cerebral hemispheres. Several of these foci radiated in a perpendicular fashion from the lateral ventricles. Callososeptal involvement. Patchy infratentorial involvement noted about the fourth ventricle and right pons. Multiple scattered corresponding T1 black holes. Findings consistent with provided history of multiple sclerosis. No associated restricted diffusion or enhancement to suggest active demyelination. No evidence for acute or subacute infarct. Gray-white matter differentiation otherwise maintained. No encephalomalacia to suggest chronic cortical infarction. No evidence for acute or chronic intracranial hemorrhage. No mass lesion, midline shift or mass effect. No hydrocephalus or extra-axial fluid collection. Pituitary gland suprasellar region within normal limits. Midline structures intact. No other abnormal enhancement. Vascular: Major intracranial vascular flow voids are maintained. Skull and upper cervical spine: Craniocervical junction within normal limits. Bone marrow signal intensity normal. No scalp soft tissue abnormality. Sinuses/Orbits: Globes and orbital soft tissues within normal limits. Right maxillary sinus retention cyst noted. Paranasal sinuses are otherwise clear. No mastoid effusion. Inner ear structures grossly normal. Other: None. MRA HEAD FINDINGS ANTERIOR CIRCULATION: Examination mildly degraded by motion artifact. Visualized distal cervical segments of the internal carotid arteries are patent with antegrade flow. Petrous, cavernous, and supraclinoid segments patent without stenosis or other abnormality. A1 segments widely patent. Normal anterior communicating artery complex. Anterior cerebral arteries patent to their distal aspects without stenosis. No M1  stenosis or occlusion. Normal MCA bifurcations. Distal MCA branches well perfused and symmetric. POSTERIOR CIRCULATION: Both V4 segments patent to the vertebrobasilar junction without stenosis. Right vertebral dominant. Partially visualized left PICA patent. Right PICA not seen. Basilar patent to its distal aspect without stenosis. Superior cerebellar arteries patent bilaterally. Both PCA supplied via the basilar as well as prominent bilateral posterior communicating arteries. PCAs grossly perfused to their distal aspects without appreciable stenosis. No intracranial aneurysm. MRI CERVICAL SPINE FINDINGS Alignment: Straightening of the normal cervical lordosis. No listhesis. Vertebrae: Vertebral body height maintained without acute or chronic fracture. Bone marrow signal intensity somewhat diffusely heterogeneous but within normal limits. No discrete or worrisome osseous lesions. No abnormal marrow edema or enhancement. Cord: Extensive patchy signal abnormality seen throughout the cervical spinal cord, extending from C1-2 through C6-7, with involvement of nearly all levels. Overall, involvement is slightly greater within the right hemi cord as compared to the left. Underlying cord atrophy. Finding consistent with multiple sclerosis. No abnormal enhancement to suggest active demyelination. Posterior Fossa, vertebral arteries, paraspinal tissues: Paraspinous and prevertebral soft tissues within normal limits. Normal flow voids seen within the vertebral arteries bilaterally. Disc levels: C2-C3: Small central disc protrusion indents the ventral thecal sac. Associated annular fissure. No significant spinal stenosis. Mild left greater than right uncovertebral hypertrophy without significant foraminal encroachment. C3-C4: Broad-based left paracentral disc osteophyte complex indents the left ventral thecal sac (series 9, image 9). Resultant mild spinal stenosis with mild flattening of the left hemi cord. Right worse than  left uncovertebral hypertrophy  with resultant moderate right C4 foraminal stenosis. C4-C5: Mild disc bulge with uncovertebral hypertrophy. Superimposed central disc protrusion indents the ventral thecal sac, contacting and mildly flattening the ventral cord. Resultant mild spinal stenosis. Right worse than left uncovertebral hypertrophy with resultant mild to moderate right C5 foraminal stenosis. Left neural foramina remains patent. C5-C6: Diffuse disc bulge with bilateral uncovertebral hypertrophy. Superimposed central disc protrusion indents the ventral thecal sac, slightly asymmetric to the right (series 9, image 19). Mild flattening of the right hemi cord with mild spinal stenosis. Mild right C6 foraminal narrowing. C6-C7: Mild disc bulge with right greater than left uncovertebral hypertrophy. No significant spinal stenosis. Mild right C7 foraminal narrowing. C7-T1:  Unremarkable. IMPRESSION: MRI HEAD IMPRESSION: 1. Patchy multifocal T2/FLAIR signal abnormality involving the supratentorial and infratentorial white matter as above, consistent with provided history of multiple sclerosis. No evidence for active demyelination. 2. Otherwise unremarkable brain MRI for age. No other acute intracranial abnormality. MRA HEAD IMPRESSION: Normal intracranial MRA. No large vessel occlusion or hemodynamically significant stenosis. MRI CERVICAL SPINE IMPRESSION: 1. Extensive patchy signal abnormality throughout the cervical spinal cord, consistent with multiple sclerosis. No abnormal enhancement to suggest active demyelination. 2. Multilevel cervical spondylosis with resultant mild diffuse spinal stenosis at C3-4 through C5-6. Mild to moderate right C4 through C6 foraminal narrowing as above. Electronically Signed   By: Rise Mu M.D.   On: 10/29/2020 19:33   MR Brain W and Wo Contrast  Result Date: 10/29/2020 EXAM: MRI HEAD WITHOUT AND WITH CONTRAST MRI CERVICAL SPINE WITHOUT AND WITH CONTRAST MRA HEAD WITHOUT  CONTRAST TECHNIQUE: Multiplanar, multiecho pulse sequences of the brain and surrounding structures, and cervical spine, to include the craniocervical junction and cervicothoracic junction, were obtained without and with intravenous contrast. Additional angiographic images of the intracranial circulation and circle-of-Willis were performed without the administration of IV contrast. CONTRAST:  10mL GADAVIST GADOBUTROL 1 MMOL/ML IV SOLN COMPARISON:  None. FINDINGS: MRI HEAD FINDINGS Brain: Cerebral volume within normal limits for age. Patchy multifocal foci of T2/FLAIR hyperintensity seen involving the periventricular, deep, in juxta cortical white matter of both cerebral hemispheres. Several of these foci radiated in a perpendicular fashion from the lateral ventricles. Callososeptal involvement. Patchy infratentorial involvement noted about the fourth ventricle and right pons. Multiple scattered corresponding T1 black holes. Findings consistent with provided history of multiple sclerosis. No associated restricted diffusion or enhancement to suggest active demyelination. No evidence for acute or subacute infarct. Gray-white matter differentiation otherwise maintained. No encephalomalacia to suggest chronic cortical infarction. No evidence for acute or chronic intracranial hemorrhage. No mass lesion, midline shift or mass effect. No hydrocephalus or extra-axial fluid collection. Pituitary gland suprasellar region within normal limits. Midline structures intact. No other abnormal enhancement. Vascular: Major intracranial vascular flow voids are maintained. Skull and upper cervical spine: Craniocervical junction within normal limits. Bone marrow signal intensity normal. No scalp soft tissue abnormality. Sinuses/Orbits: Globes and orbital soft tissues within normal limits. Right maxillary sinus retention cyst noted. Paranasal sinuses are otherwise clear. No mastoid effusion. Inner ear structures grossly normal. Other:  None. MRA HEAD FINDINGS ANTERIOR CIRCULATION: Examination mildly degraded by motion artifact. Visualized distal cervical segments of the internal carotid arteries are patent with antegrade flow. Petrous, cavernous, and supraclinoid segments patent without stenosis or other abnormality. A1 segments widely patent. Normal anterior communicating artery complex. Anterior cerebral arteries patent to their distal aspects without stenosis. No M1 stenosis or occlusion. Normal MCA bifurcations. Distal MCA branches well perfused and symmetric. POSTERIOR CIRCULATION: Both V4 segments  patent to the vertebrobasilar junction without stenosis. Right vertebral dominant. Partially visualized left PICA patent. Right PICA not seen. Basilar patent to its distal aspect without stenosis. Superior cerebellar arteries patent bilaterally. Both PCA supplied via the basilar as well as prominent bilateral posterior communicating arteries. PCAs grossly perfused to their distal aspects without appreciable stenosis. No intracranial aneurysm. MRI CERVICAL SPINE FINDINGS Alignment: Straightening of the normal cervical lordosis. No listhesis. Vertebrae: Vertebral body height maintained without acute or chronic fracture. Bone marrow signal intensity somewhat diffusely heterogeneous but within normal limits. No discrete or worrisome osseous lesions. No abnormal marrow edema or enhancement. Cord: Extensive patchy signal abnormality seen throughout the cervical spinal cord, extending from C1-2 through C6-7, with involvement of nearly all levels. Overall, involvement is slightly greater within the right hemi cord as compared to the left. Underlying cord atrophy. Finding consistent with multiple sclerosis. No abnormal enhancement to suggest active demyelination. Posterior Fossa, vertebral arteries, paraspinal tissues: Paraspinous and prevertebral soft tissues within normal limits. Normal flow voids seen within the vertebral arteries bilaterally. Disc  levels: C2-C3: Small central disc protrusion indents the ventral thecal sac. Associated annular fissure. No significant spinal stenosis. Mild left greater than right uncovertebral hypertrophy without significant foraminal encroachment. C3-C4: Broad-based left paracentral disc osteophyte complex indents the left ventral thecal sac (series 9, image 9). Resultant mild spinal stenosis with mild flattening of the left hemi cord. Right worse than left uncovertebral hypertrophy with resultant moderate right C4 foraminal stenosis. C4-C5: Mild disc bulge with uncovertebral hypertrophy. Superimposed central disc protrusion indents the ventral thecal sac, contacting and mildly flattening the ventral cord. Resultant mild spinal stenosis. Right worse than left uncovertebral hypertrophy with resultant mild to moderate right C5 foraminal stenosis. Left neural foramina remains patent. C5-C6: Diffuse disc bulge with bilateral uncovertebral hypertrophy. Superimposed central disc protrusion indents the ventral thecal sac, slightly asymmetric to the right (series 9, image 19). Mild flattening of the right hemi cord with mild spinal stenosis. Mild right C6 foraminal narrowing. C6-C7: Mild disc bulge with right greater than left uncovertebral hypertrophy. No significant spinal stenosis. Mild right C7 foraminal narrowing. C7-T1:  Unremarkable. IMPRESSION: MRI HEAD IMPRESSION: 1. Patchy multifocal T2/FLAIR signal abnormality involving the supratentorial and infratentorial white matter as above, consistent with provided history of multiple sclerosis. No evidence for active demyelination. 2. Otherwise unremarkable brain MRI for age. No other acute intracranial abnormality. MRA HEAD IMPRESSION: Normal intracranial MRA. No large vessel occlusion or hemodynamically significant stenosis. MRI CERVICAL SPINE IMPRESSION: 1. Extensive patchy signal abnormality throughout the cervical spinal cord, consistent with multiple sclerosis. No abnormal  enhancement to suggest active demyelination. 2. Multilevel cervical spondylosis with resultant mild diffuse spinal stenosis at C3-4 through C5-6. Mild to moderate right C4 through C6 foraminal narrowing as above. Electronically Signed   By: Rise Mu M.D.   On: 10/29/2020 19:33   MR Cervical Spine W or Wo Contrast  Result Date: 10/29/2020 EXAM: MRI HEAD WITHOUT AND WITH CONTRAST MRI CERVICAL SPINE WITHOUT AND WITH CONTRAST MRA HEAD WITHOUT CONTRAST TECHNIQUE: Multiplanar, multiecho pulse sequences of the brain and surrounding structures, and cervical spine, to include the craniocervical junction and cervicothoracic junction, were obtained without and with intravenous contrast. Additional angiographic images of the intracranial circulation and circle-of-Willis were performed without the administration of IV contrast. CONTRAST:  10mL GADAVIST GADOBUTROL 1 MMOL/ML IV SOLN COMPARISON:  None. FINDINGS: MRI HEAD FINDINGS Brain: Cerebral volume within normal limits for age. Patchy multifocal foci of T2/FLAIR hyperintensity seen involving the periventricular, deep, in  juxta cortical white matter of both cerebral hemispheres. Several of these foci radiated in a perpendicular fashion from the lateral ventricles. Callososeptal involvement. Patchy infratentorial involvement noted about the fourth ventricle and right pons. Multiple scattered corresponding T1 black holes. Findings consistent with provided history of multiple sclerosis. No associated restricted diffusion or enhancement to suggest active demyelination. No evidence for acute or subacute infarct. Gray-white matter differentiation otherwise maintained. No encephalomalacia to suggest chronic cortical infarction. No evidence for acute or chronic intracranial hemorrhage. No mass lesion, midline shift or mass effect. No hydrocephalus or extra-axial fluid collection. Pituitary gland suprasellar region within normal limits. Midline structures intact. No other  abnormal enhancement. Vascular: Major intracranial vascular flow voids are maintained. Skull and upper cervical spine: Craniocervical junction within normal limits. Bone marrow signal intensity normal. No scalp soft tissue abnormality. Sinuses/Orbits: Globes and orbital soft tissues within normal limits. Right maxillary sinus retention cyst noted. Paranasal sinuses are otherwise clear. No mastoid effusion. Inner ear structures grossly normal. Other: None. MRA HEAD FINDINGS ANTERIOR CIRCULATION: Examination mildly degraded by motion artifact. Visualized distal cervical segments of the internal carotid arteries are patent with antegrade flow. Petrous, cavernous, and supraclinoid segments patent without stenosis or other abnormality. A1 segments widely patent. Normal anterior communicating artery complex. Anterior cerebral arteries patent to their distal aspects without stenosis. No M1 stenosis or occlusion. Normal MCA bifurcations. Distal MCA branches well perfused and symmetric. POSTERIOR CIRCULATION: Both V4 segments patent to the vertebrobasilar junction without stenosis. Right vertebral dominant. Partially visualized left PICA patent. Right PICA not seen. Basilar patent to its distal aspect without stenosis. Superior cerebellar arteries patent bilaterally. Both PCA supplied via the basilar as well as prominent bilateral posterior communicating arteries. PCAs grossly perfused to their distal aspects without appreciable stenosis. No intracranial aneurysm. MRI CERVICAL SPINE FINDINGS Alignment: Straightening of the normal cervical lordosis. No listhesis. Vertebrae: Vertebral body height maintained without acute or chronic fracture. Bone marrow signal intensity somewhat diffusely heterogeneous but within normal limits. No discrete or worrisome osseous lesions. No abnormal marrow edema or enhancement. Cord: Extensive patchy signal abnormality seen throughout the cervical spinal cord, extending from C1-2 through C6-7,  with involvement of nearly all levels. Overall, involvement is slightly greater within the right hemi cord as compared to the left. Underlying cord atrophy. Finding consistent with multiple sclerosis. No abnormal enhancement to suggest active demyelination. Posterior Fossa, vertebral arteries, paraspinal tissues: Paraspinous and prevertebral soft tissues within normal limits. Normal flow voids seen within the vertebral arteries bilaterally. Disc levels: C2-C3: Small central disc protrusion indents the ventral thecal sac. Associated annular fissure. No significant spinal stenosis. Mild left greater than right uncovertebral hypertrophy without significant foraminal encroachment. C3-C4: Broad-based left paracentral disc osteophyte complex indents the left ventral thecal sac (series 9, image 9). Resultant mild spinal stenosis with mild flattening of the left hemi cord. Right worse than left uncovertebral hypertrophy with resultant moderate right C4 foraminal stenosis. C4-C5: Mild disc bulge with uncovertebral hypertrophy. Superimposed central disc protrusion indents the ventral thecal sac, contacting and mildly flattening the ventral cord. Resultant mild spinal stenosis. Right worse than left uncovertebral hypertrophy with resultant mild to moderate right C5 foraminal stenosis. Left neural foramina remains patent. C5-C6: Diffuse disc bulge with bilateral uncovertebral hypertrophy. Superimposed central disc protrusion indents the ventral thecal sac, slightly asymmetric to the right (series 9, image 19). Mild flattening of the right hemi cord with mild spinal stenosis. Mild right C6 foraminal narrowing. C6-C7: Mild disc bulge with right greater than left uncovertebral hypertrophy.  No significant spinal stenosis. Mild right C7 foraminal narrowing. C7-T1:  Unremarkable. IMPRESSION: MRI HEAD IMPRESSION: 1. Patchy multifocal T2/FLAIR signal abnormality involving the supratentorial and infratentorial white matter as above,  consistent with provided history of multiple sclerosis. No evidence for active demyelination. 2. Otherwise unremarkable brain MRI for age. No other acute intracranial abnormality. MRA HEAD IMPRESSION: Normal intracranial MRA. No large vessel occlusion or hemodynamically significant stenosis. MRI CERVICAL SPINE IMPRESSION: 1. Extensive patchy signal abnormality throughout the cervical spinal cord, consistent with multiple sclerosis. No abnormal enhancement to suggest active demyelination. 2. Multilevel cervical spondylosis with resultant mild diffuse spinal stenosis at C3-4 through C5-6. Mild to moderate right C4 through C6 foraminal narrowing as above. Electronically Signed   By: Rise Mu M.D.   On: 10/29/2020 19:33   CT ABDOMEN PELVIS W CONTRAST  Result Date: 10/29/2020 CLINICAL DATA:  50 year old male with. EXAM: CT ABDOMEN AND PELVIS WITH CONTRAST TECHNIQUE: Multidetector CT imaging of the abdomen and pelvis was performed using the standard protocol following bolus administration of intravenous contrast. CONTRAST:  OMNIPAQUE IOHEXOL 300 MG/ML  SOLN COMPARISON:  CT abdomen pelvis dated 08/12/2010. FINDINGS: Lower chest: Bilateral patchy and streaky densities, likely atelectasis/scarring. Pneumonia is less likely. Clinical correlation recommended. No intra-abdominal free air or free fluid. Hepatobiliary: The liver is unremarkable. No intrahepatic biliary ductal dilatation. The gallbladder is unremarkable. Pancreas: Unremarkable. No pancreatic ductal dilatation or surrounding inflammatory changes. Spleen: Normal in size without focal abnormality. Adrenals/Urinary Tract: The adrenal glands are unremarkable. There is no hydronephrosis on either side. There is symmetric enhancement and excretion of contrast by both kidneys. The visualized ureters appear unremarkable. The urinary bladder is partially distended and grossly unremarkable. Stomach/Bowel: Moderate stool throughout the colon. There is no  bowel obstruction or active inflammation. The appendix is normal. Vascular/Lymphatic: The abdominal aorta and IVC unremarkable no portal venous gas. There is no adenopathy. Reproductive: The prostate and seminal vesicles are grossly unremarkable. Left testicular prosthesis. Other: Small fat containing umbilical hernia. No fluid collection or inflammatory changes. Musculoskeletal: Degenerative changes of the spine. No acute osseous pathology. IMPRESSION: 1. No acute intra-abdominal or pelvic pathology. 2. Moderate colonic stool burden. No bowel obstruction. Normal appendix. Electronically Signed   By: Elgie Collard M.D.   On: 10/29/2020 21:04   DG Chest Port 1 View  Result Date: 11/17/2020 CLINICAL DATA:  Shortness of breath.  Recent diagnosis of COVID. EXAM: PORTABLE CHEST 1 VIEW COMPARISON:  Chest radiograph from 06/06/2018. CT AP from 10/29/2020. FINDINGS: Normal heart size and mediastinal contours. There is retrocardiac opacification within the left lung base. Scattered interstitial and airspace densities are noted bilaterally compatible with the clinical history of COVID pneumonia. Marked thoracolumbar scoliosis deformity. IMPRESSION: Bilateral interstitial and airspace densities compatible with history of COVID pneumonia. Electronically Signed   By: Signa Kell M.D.   On: 11/17/2020 12:27   DG HIPS BILAT WITH PELVIS MIN 5 VIEWS  Result Date: 10/29/2020 CLINICAL DATA:  Fall.  Bilateral hip and abdominal pain. EXAM: DG HIP (WITH OR WITHOUT PELVIS) 5+V BILAT COMPARISON:  CT abdomen and pelvis 08/12/2010 FINDINGS: No fracture or hip dislocation is identified. Hip joint space widths are preserved. Small calcifications in the pelvis reflect phleboliths. IMPRESSION: No acute osseous abnormality identified. Electronically Signed   By: Sebastian Ache M.D.   On: 10/29/2020 15:33       Subjective: Shortness of breath is better.  Weaned off of oxygen.  Feels breathing is back to baseline.  Discharge  Exam: Vitals:  11/18/20 1955 11/19/20 0757 11/19/20 1040 11/19/20 1230  BP:    127/80  Pulse:   83 93  Resp:    17  Temp:    98.1 F (36.7 C)  TempSrc:    Oral  SpO2: 95% 95% 96% 94%  Weight:      Height:        General: Pt is alert, awake, not in acute distress Cardiovascular: RRR, S1/S2 +, no rubs, no gallops Respiratory: CTA bilaterally, no wheezing, no rhonchi Abdominal: Soft, NT, ND, bowel sounds + Extremities: no edema, no cyanosis    The results of significant diagnostics from this hospitalization (including imaging, microbiology, ancillary and laboratory) are listed below for reference.     Microbiology: Recent Results (from the past 240 hour(s))  SARS CORONAVIRUS 2 (TAT 6-24 HRS) Nasopharyngeal Nasopharyngeal Swab     Status: Abnormal   Collection Time: 11/17/20  8:46 PM   Specimen: Nasopharyngeal Swab  Result Value Ref Range Status   SARS Coronavirus 2 POSITIVE (A) NEGATIVE Final    Comment: (NOTE) SARS-CoV-2 target nucleic acids are DETECTED.  The SARS-CoV-2 RNA is generally detectable in upper and lower respiratory specimens during the acute phase of infection. Positive results are indicative of the presence of SARS-CoV-2 RNA. Clinical correlation with patient history and other diagnostic information is  necessary to determine patient infection status. Positive results do not rule out bacterial infection or co-infection with other viruses.  The expected result is Negative.  Fact Sheet for Patients: HairSlick.no  Fact Sheet for Healthcare Providers: quierodirigir.com  This test is not yet approved or cleared by the [place] FDA and  has been authorized for detection and/or diagnosis of SARS-CoV-2 by FDA under an Emergency Use Authorization (EUA). This EUA will remain  in effect (meaning this test can be used) for the duration of the COVID-19 declaration under Section 564(b)(1) of the Act, 21 U.  S.C. section 360bbb-3(b)(1), unless the authorization is terminated or revoked sooner.   Performed at [facility], [address], [place], [place] [address]      Labs: BNP (last 3 results) No results for input(s): BNP in the last 8760 hours. Basic Metabolic Panel: Recent Labs  Lab 11/17/20 1136 11/18/20 0429 11/19/20 0446  NA 132* 135 136  K 4.4 4.5 4.7  CL 97* 99 100  CO2 25 24 26   GLUCOSE 129* 133* 126*  BUN 14 18 26*  CREATININE 1.16 0.89 0.89  CALCIUM 9.3 9.2 9.2  MG  --  2.1  --   PHOS  --  2.6  --    Liver Function Tests: Recent Labs  Lab 11/17/20 1136 11/18/20 0429 11/19/20 0446  AST 45* 35 38  ALT 39 34 39  ALKPHOS 117 105 93  BILITOT 0.6 0.7 0.5  PROT 7.9 7.0 7.0  ALBUMIN 3.9 3.0* 3.1*   No results for input(s): LIPASE, AMYLASE in the last 168 hours. No results for input(s): AMMONIA in the last 168 hours. CBC: Recent Labs  Lab 11/17/20 1136 11/18/20 0429  WBC 11.0* 7.0  NEUTROABS 10.1* 6.3  HGB 14.4 12.9*  HCT 46.3 40.8  MCV 84.6 82.3  PLT 288 311   Cardiac Enzymes: No results for input(s): CKTOTAL, CKMB, CKMBINDEX, TROPONINI in the last 168 hours. BNP: Invalid input(s): POCBNP CBG: No results for input(s): GLUCAP in the last 168 hours. D-Dimer Recent Labs    11/18/20 0429 11/19/20 0446  DDIMER 0.88* 0.59*   Hgb A1c No results for input(s): HGBA1C  in the last 72 hours. Lipid Profile Recent Labs    11/17/20 1341  TRIG 163*   Thyroid function studies No results for input(s): TSH, T4TOTAL, T3FREE, THYROIDAB in the last 72 hours.  Invalid input(s): FREET3 Anemia work up Recent Labs    11/18/20 0429 11/19/20 0446  FERRITIN 424* 539*   Urinalysis    Component Value Date/Time   COLORURINE YELLOW 10/29/2020 1420   APPEARANCEUR CLEAR 10/29/2020 1420   LABSPEC 1.013 10/29/2020 1420   PHURINE 6.0 10/29/2020 1420   GLUCOSEU NEGATIVE 10/29/2020 1420   HGBUR NEGATIVE 10/29/2020 1420   BILIRUBINUR NEGATIVE 10/29/2020  1420   BILIRUBINUR neg 08/06/2020 1331   KETONESUR NEGATIVE 10/29/2020 1420   PROTEINUR NEGATIVE 10/29/2020 1420   UROBILINOGEN 1.0 08/06/2020 1331   UROBILINOGEN 1.0 01/13/2011 2358   NITRITE NEGATIVE 10/29/2020 1420   LEUKOCYTESUR NEGATIVE 10/29/2020 1420   Sepsis Labs Invalid input(s): PROCALCITONIN,  WBC,  LACTICIDVEN Microbiology Recent Results (from the past 240 hour(s))  SARS CORONAVIRUS 2 (TAT 6-24 HRS) Nasopharyngeal Nasopharyngeal Swab     Status: Abnormal   Collection Time: 11/17/20  8:46 PM   Specimen: Nasopharyngeal Swab  Result Value Ref Range Status   SARS Coronavirus 2 POSITIVE (A) NEGATIVE Final    Comment: (NOTE) SARS-CoV-2 target nucleic acids are DETECTED.  The SARS-CoV-2 RNA is generally detectable in upper and lower respiratory specimens during the acute phase of infection. Positive results are indicative of the presence of SARS-CoV-2 RNA. Clinical correlation with patient history and other diagnostic information is  necessary to determine patient infection status. Positive results do not rule out bacterial infection or co-infection with other viruses.  The expected result is Negative.  Fact Sheet for Patients: HairSlick.no  Fact Sheet for Healthcare Providers: quierodirigir.com  This test is not yet approved or cleared by the [place] FDA and  has been authorized for detection and/or diagnosis of SARS-CoV-2 by FDA under an Emergency Use Authorization (EUA). This EUA will remain  in effect (meaning this test can be used) for the duration of the COVID-19 declaration under Section 564(b)(1) of the Act, 21 U. S.C. section 360bbb-3(b)(1), unless the authorization is terminated or revoked sooner.   Performed at [facility], [address] [place], [place] [address]      Time coordinating discharge:  SIGNED:   Erick Blinks, MD  [facility] 11/19/2020, 4:07  PM   If 7PM-7AM, please contact night-coverage [email or web address]

## 2020-11-19 NOTE — Plan of Care (Signed)
  Problem: Acute Rehab PT Goals(only PT should resolve) Goal: Pt Will Go Supine/Side To Sit Outcome: Progressing Flowsheets (Taken 11/19/2020 1408) Pt will go Supine/Side to Sit: with moderate assist Goal: Pt Will Go Sit To Supine/Side Outcome: Progressing Flowsheets (Taken 11/19/2020 1408) Pt will go Sit to Supine/Side: with moderate assist Goal: Pt Will Transfer Bed To Chair/Chair To Bed Outcome: Progressing Flowsheets (Taken 11/19/2020 1408) Pt will Transfer Bed to Chair/Chair to Bed: (slideboard)  with mod assist  Other (comment)   Tori Broussard PT, DPT 11/19/20, 2:08 PM

## 2020-11-19 NOTE — Progress Notes (Signed)
Report called [facility] and spoke Occupational hygienist. Pt loaded on EMS stretcher in [place]. Pt had IS with him and his phone. Wife notified of patient return to facility.

## 2020-11-19 NOTE — NC FL2 (Deleted)
[place] MEDICAID FL2 LEVEL OF CARE SCREENING TOOL     IDENTIFICATION  Patient Name: Vincent Anthony Birthdate: Nov 12, 1970 Sex: male Admission Date (Current Location): 11/17/2020  [facility] and [place] Number:      Facility and Address:         Provider Number:    Attending Physician Name and Address:  Erick Blinks, MD  Relative Name and Phone Number:       Current Level of Care:   Recommended Level of Care:   Prior Approval Number:    Date Approved/Denied:   PASRR Number:    Discharge Plan:      Current Diagnoses: Patient Active Problem List   Diagnosis Date Noted  . Seizures (HCC) 11/18/2020  . Hyponatremia 11/18/2020  . AKI (acute kidney injury) (HCC) 11/18/2020  . Acute respiratory failure with hypoxia (HCC) 11/18/2020  . Pneumonia due to COVID-19 virus 11/17/2020  . MS (multiple sclerosis) (HCC) 03/08/2018  . 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 History of urinary stone 02/03/2018  . Urge incontinence of urine 02/03/2018  . Abdominal pain 01/05/2017  . Elevated LFTs 11/05/2016  . Neuropathic pain 05/23/2015  . Arthralgia of both knees 05/23/2015  . Urinary tract stones 01/03/2015  . Chronic pain syndrome 03/20/2014  . 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 Bilateral knee pain 01/29/2013  . 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 Ongoing use of possibly toxic medication 10/11/2012  . Acne 01/13/2012  . Seborrheic dermatitis 01/13/2012  . Neurogenic bladder 11/13/2011  . Urinary urgency 11/13/2011    Orientation RESPIRATION BLADDER Height & Weight            Weight: 220 lb 0.3 oz (99.8 kg) Height:  6\' 3"  (190.5 cm)  BEHAVIORAL SYMPTOMS/MOOD NEUROLOGICAL BOWEL NUTRITION STATUS           AMBULATORY STATUS COMMUNICATION OF NEEDS Skin                               Personal Care Assistance Level of Assistance              Functional Limitations Info             SPECIAL CARE FACTORS FREQUENCY                        Contractures      Additional Factors Info  Isolation Precautions         Isolation Precautions Info: COVID+ 11/17/20     Current Medications (11/19/2020):  This is the current hospital active medication list Current Facility-Administered Medications  Medication Dose Route Frequency Provider Last Rate Last Admin  . acetaminophen (TYLENOL) tablet 650 mg  650 mg Oral Q6H PRN Emokpae, Ejiroghene E, MD      . albuterol (VENTOLIN HFA) 108 (90 Base) MCG/ACT inhaler 1-2 puff  1-2 puff Inhalation Q4H PRN Arrien, 11/21/2020, MD      . ascorbic acid (VITAMIN C) tablet 500 mg  500 mg Oral Daily Emokpae, Ejiroghene E, MD   500 mg at 11/19/20 0936  . baclofen (LIORESAL) tablet 10 mg  10 mg Oral TID Emokpae, Ejiroghene E, MD   10 mg at 11/19/20 0936  . baricitinib (OLUMIANT) tablet 4 mg  4 mg Oral Daily Arrien, 11/21/20, MD   4 mg at 11/19/20 0935  . clonazePAM (KLONOPIN) tablet 1 mg  1 mg Oral BID Emokpae, Ejiroghene E, MD   1 mg  at 11/19/20 0935  . enoxaparin (LOVENOX) injection 40 mg  40 mg Subcutaneous Q24H Emokpae, Ejiroghene E, MD   40 mg at 11/18/20 1744  . guaiFENesin-dextromethorphan (ROBITUSSIN DM) 100-10 MG/5ML syrup 10 mL  10 mL Oral Q4H PRN Emokpae, Ejiroghene E, MD      . hyoscyamine (LEVBID) 0.375 MG 12 hr tablet 0.375 mg  0.375 mg Oral Q12H Emokpae, Ejiroghene E, MD   0.375 mg at 11/19/20 0945  . Ipratropium-Albuterol (COMBIVENT) respimat 1 puff  1 puff Inhalation BID Erick Blinks, MD      . levETIRAcetam (KEPPRA) tablet 250 mg  250 mg Oral BID Emokpae, Ejiroghene E, MD   250 mg at 11/19/20 0935  . loratadine (CLARITIN) tablet 10 mg  10 mg Oral Daily Emokpae, Ejiroghene E, MD   10 mg at 11/19/20 0935  . methylPREDNISolone sodium succinate (SOLU-MEDROL) 125 mg/2 mL injection 50 mg  50 mg Intravenous Q12H Arrien, York Ram, MD   50 mg at 11/19/20 0226  . ondansetron (ZOFRAN) tablet 4 mg  4 mg Oral Q6H PRN Emokpae, Ejiroghene E, MD       Or  . ondansetron (ZOFRAN)  injection 4 mg  4 mg Intravenous Q6H PRN Emokpae, Ejiroghene E, MD      . polyethylene glycol (MIRALAX / GLYCOLAX) packet 17 g  17 g Oral BID Coralie Keens, MD   17 g at 11/18/20 1555  . remdesivir 100 mg in sodium chloride 0.9 % 100 mL IVPB  100 mg Intravenous Daily Emokpae, Ejiroghene E, MD 200 mL/hr at 11/19/20 0943 100 mg at 11/19/20 0943  . zinc sulfate capsule 220 mg  220 mg Oral Daily Emokpae, Ejiroghene E, MD   220 mg at 11/19/20 5726     Discharge Medications: Please see discharge summary for a list of discharge medications.  Relevant Imaging Results:  Relevant Lab Results:   Additional Information    Yael Angerer, Juleen China, LCSW

## 2020-12-29 ENCOUNTER — Emergency Department (HOSPITAL_COMMUNITY): Payer: Medicare HMO

## 2020-12-29 ENCOUNTER — Inpatient Hospital Stay (HOSPITAL_COMMUNITY)
Admission: EM | Admit: 2020-12-29 | Discharge: 2021-01-06 | DRG: 698 | Disposition: A | Discharge status: Skilled Nursing Facility | Payer: Medicare HMO | Source: Skilled Nursing Facility | Attending: Internal Medicine | Admitting: Internal Medicine

## 2020-12-29 ENCOUNTER — Other Ambulatory Visit: Payer: Self-pay

## 2020-12-29 ENCOUNTER — Encounter (HOSPITAL_COMMUNITY): Payer: Self-pay | Admitting: Emergency Medicine

## 2020-12-29 DIAGNOSIS — K5900 Constipation, unspecified: Secondary | ICD-10-CM | POA: Diagnosis present

## 2020-12-29 DIAGNOSIS — R339 Retention of urine, unspecified: Secondary | ICD-10-CM | POA: Diagnosis present

## 2020-12-29 DIAGNOSIS — Z20822 Contact with and (suspected) exposure to covid-19: Secondary | ICD-10-CM | POA: Diagnosis present

## 2020-12-29 DIAGNOSIS — R7881 Bacteremia: Secondary | ICD-10-CM | POA: Diagnosis present

## 2020-12-29 DIAGNOSIS — R6521 Severe sepsis with septic shock: Secondary | ICD-10-CM | POA: Diagnosis not present

## 2020-12-29 DIAGNOSIS — D849 Immunodeficiency, unspecified: Secondary | ICD-10-CM | POA: Diagnosis present

## 2020-12-29 DIAGNOSIS — K219 Gastro-esophageal reflux disease without esophagitis: Secondary | ICD-10-CM | POA: Diagnosis present

## 2020-12-29 DIAGNOSIS — F419 Anxiety disorder, unspecified: Secondary | ICD-10-CM | POA: Diagnosis present

## 2020-12-29 DIAGNOSIS — Z881 Allergy status to other antibiotic agents status: Secondary | ICD-10-CM

## 2020-12-29 DIAGNOSIS — Z8249 Family history of ischemic heart disease and other diseases of the circulatory system: Secondary | ICD-10-CM | POA: Diagnosis not present

## 2020-12-29 DIAGNOSIS — Z79899 Other long term (current) drug therapy: Secondary | ICD-10-CM

## 2020-12-29 DIAGNOSIS — A419 Sepsis, unspecified organism: Secondary | ICD-10-CM | POA: Diagnosis present

## 2020-12-29 DIAGNOSIS — E876 Hypokalemia: Secondary | ICD-10-CM | POA: Diagnosis not present

## 2020-12-29 DIAGNOSIS — D649 Anemia, unspecified: Secondary | ICD-10-CM | POA: Diagnosis present

## 2020-12-29 DIAGNOSIS — S3730XA Unspecified injury of urethra, initial encounter: Secondary | ICD-10-CM | POA: Diagnosis present

## 2020-12-29 DIAGNOSIS — D696 Thrombocytopenia, unspecified: Secondary | ICD-10-CM | POA: Diagnosis not present

## 2020-12-29 DIAGNOSIS — Z8616 Personal history of COVID-19: Secondary | ICD-10-CM

## 2020-12-29 DIAGNOSIS — Z833 Family history of diabetes mellitus: Secondary | ICD-10-CM

## 2020-12-29 DIAGNOSIS — T83028A Displacement of other indwelling urethral catheter, initial encounter: Secondary | ICD-10-CM | POA: Diagnosis present

## 2020-12-29 DIAGNOSIS — Y846 Urinary catheterization as the cause of abnormal reaction of the patient, or of later complication, without mention of misadventure at the time of the procedure: Secondary | ICD-10-CM | POA: Diagnosis present

## 2020-12-29 DIAGNOSIS — E872 Acidosis: Secondary | ICD-10-CM | POA: Diagnosis present

## 2020-12-29 DIAGNOSIS — T83511A Infection and inflammatory reaction due to indwelling urethral catheter, initial encounter: Principal | ICD-10-CM | POA: Diagnosis present

## 2020-12-29 DIAGNOSIS — Z888 Allergy status to other drugs, medicaments and biological substances status: Secondary | ICD-10-CM

## 2020-12-29 DIAGNOSIS — N319 Neuromuscular dysfunction of bladder, unspecified: Secondary | ICD-10-CM | POA: Diagnosis present

## 2020-12-29 DIAGNOSIS — J9601 Acute respiratory failure with hypoxia: Secondary | ICD-10-CM

## 2020-12-29 DIAGNOSIS — Z7952 Long term (current) use of systemic steroids: Secondary | ICD-10-CM | POA: Diagnosis not present

## 2020-12-29 DIAGNOSIS — G35D Multiple sclerosis, unspecified: Secondary | ICD-10-CM | POA: Diagnosis present

## 2020-12-29 DIAGNOSIS — N3281 Overactive bladder: Secondary | ICD-10-CM | POA: Diagnosis present

## 2020-12-29 DIAGNOSIS — G35 Multiple sclerosis: Secondary | ICD-10-CM | POA: Diagnosis present

## 2020-12-29 DIAGNOSIS — E119 Type 2 diabetes mellitus without complications: Secondary | ICD-10-CM | POA: Diagnosis present

## 2020-12-29 DIAGNOSIS — A4181 Sepsis due to Enterococcus: Secondary | ICD-10-CM | POA: Diagnosis not present

## 2020-12-29 DIAGNOSIS — Z87440 Personal history of urinary (tract) infections: Secondary | ICD-10-CM

## 2020-12-29 DIAGNOSIS — R140 Abdominal distension (gaseous): Secondary | ICD-10-CM | POA: Diagnosis present

## 2020-12-29 DIAGNOSIS — E785 Hyperlipidemia, unspecified: Secondary | ICD-10-CM | POA: Diagnosis present

## 2020-12-29 DIAGNOSIS — D72823 Leukemoid reaction: Secondary | ICD-10-CM | POA: Diagnosis not present

## 2020-12-29 DIAGNOSIS — R310 Gross hematuria: Secondary | ICD-10-CM | POA: Diagnosis present

## 2020-12-29 DIAGNOSIS — D62 Acute posthemorrhagic anemia: Secondary | ICD-10-CM | POA: Diagnosis present

## 2020-12-29 DIAGNOSIS — N179 Acute kidney failure, unspecified: Secondary | ICD-10-CM | POA: Diagnosis not present

## 2020-12-29 DIAGNOSIS — B952 Enterococcus as the cause of diseases classified elsewhere: Secondary | ICD-10-CM | POA: Diagnosis present

## 2020-12-29 DIAGNOSIS — Z7984 Long term (current) use of oral hypoglycemic drugs: Secondary | ICD-10-CM | POA: Diagnosis not present

## 2020-12-29 DIAGNOSIS — R319 Hematuria, unspecified: Secondary | ICD-10-CM | POA: Diagnosis not present

## 2020-12-29 DIAGNOSIS — Y92129 Unspecified place in nursing home as the place of occurrence of the external cause: Secondary | ICD-10-CM | POA: Diagnosis not present

## 2020-12-29 DIAGNOSIS — A4151 Sepsis due to Escherichia coli [E. coli]: Secondary | ICD-10-CM | POA: Diagnosis present

## 2020-12-29 HISTORY — DX: Neuromuscular dysfunction of bladder, unspecified: N31.9

## 2020-12-29 LAB — CBC WITH DIFFERENTIAL/PLATELET
Abs Immature Granulocytes: 0.04 10*3/uL (ref 0.00–0.07)
Basophils Absolute: 0 10*3/uL (ref 0.0–0.1)
Basophils Relative: 0 %
Eosinophils Absolute: 0 10*3/uL (ref 0.0–0.5)
Eosinophils Relative: 0 %
HCT: 37.8 % — ABNORMAL LOW (ref 39.0–52.0)
Hemoglobin: 11.9 g/dL — ABNORMAL LOW (ref 13.0–17.0)
Immature Granulocytes: 1 %
Lymphocytes Relative: 3 %
Lymphs Abs: 0.1 10*3/uL — ABNORMAL LOW (ref 0.7–4.0)
MCH: 27.5 pg (ref 26.0–34.0)
MCHC: 31.5 g/dL (ref 30.0–36.0)
MCV: 87.5 fL (ref 80.0–100.0)
Monocytes Absolute: 0 10*3/uL — ABNORMAL LOW (ref 0.1–1.0)
Monocytes Relative: 1 %
Neutro Abs: 3.8 10*3/uL (ref 1.7–7.7)
Neutrophils Relative %: 95 %
Platelets: 250 10*3/uL (ref 150–400)
RBC: 4.32 MIL/uL (ref 4.22–5.81)
RDW: 17.2 % — ABNORMAL HIGH (ref 11.5–15.5)
WBC: 4 10*3/uL (ref 4.0–10.5)
nRBC: 0 % (ref 0.0–0.2)

## 2020-12-29 LAB — COMPREHENSIVE METABOLIC PANEL
ALT: 38 U/L (ref 0–44)
AST: 25 U/L (ref 15–41)
Albumin: 3.2 g/dL — ABNORMAL LOW (ref 3.5–5.0)
Alkaline Phosphatase: 96 U/L (ref 38–126)
Anion gap: 15 (ref 5–15)
BUN: 13 mg/dL (ref 6–20)
CO2: 23 mmol/L (ref 22–32)
Calcium: 8.8 mg/dL — ABNORMAL LOW (ref 8.9–10.3)
Chloride: 101 mmol/L (ref 98–111)
Creatinine, Ser: 1.5 mg/dL — ABNORMAL HIGH (ref 0.61–1.24)
GFR, Estimated: 56 mL/min — ABNORMAL LOW (ref >60)
Glucose, Bld: 132 mg/dL — ABNORMAL HIGH (ref 70–99)
Potassium: 3.8 mmol/L (ref 3.5–5.1)
Sodium: 139 mmol/L (ref 135–145)
Total Bilirubin: 0.6 mg/dL (ref 0.3–1.2)
Total Protein: 6 g/dL — ABNORMAL LOW (ref 6.5–8.1)

## 2020-12-29 LAB — URINALYSIS, ROUTINE W REFLEX MICROSCOPIC
Bilirubin Urine: NEGATIVE
Glucose, UA: NEGATIVE mg/dL
Ketones, ur: NEGATIVE mg/dL
Nitrite: NEGATIVE
Protein, ur: NEGATIVE mg/dL
Specific Gravity, Urine: 1.014 (ref 1.005–1.030)
pH: 5 (ref 5.0–8.0)

## 2020-12-29 LAB — HEMOGLOBIN AND HEMATOCRIT, BLOOD
HCT: 26.8 % — ABNORMAL LOW (ref 39.0–52.0)
Hemoglobin: 8.4 g/dL — ABNORMAL LOW (ref 13.0–17.0)

## 2020-12-29 LAB — CBG MONITORING, ED: Glucose-Capillary: 118 mg/dL — ABNORMAL HIGH (ref 70–99)

## 2020-12-29 LAB — LACTIC ACID, PLASMA
Lactic Acid, Venous: 5.7 mmol/L (ref 0.5–1.9)
Lactic Acid, Venous: 5.6 mmol/L (ref 0.5–1.9)
Lactic Acid, Venous: 6 mmol/L (ref 0.5–1.9)

## 2020-12-29 LAB — APTT: aPTT: 22 seconds — ABNORMAL LOW (ref 24–36)

## 2020-12-29 LAB — PREPARE RBC (CROSSMATCH)

## 2020-12-29 LAB — PROTIME-INR
INR: 1 (ref 0.8–1.2)
Prothrombin Time: 12.6 seconds (ref 11.4–15.2)

## 2020-12-29 LAB — ABO/RH: ABO/RH(D): A POS

## 2020-12-29 IMAGING — CT CT ANGIO CHEST-ABD-PELV FOR DISSECTION W/ AND WO/W CM
2 of 7 series · 13 of 46 positions shown, 15 images · IV contrast (Omnipaque or Isovue)
Comparison: CT dated [DATE]

CLINICAL DATA: Abdominal pain. Chest pain with concern for
dissection.

EXAM:
CT ANGIOGRAPHY CHEST, ABDOMEN AND PELVIS
TECHNIQUE: Non-contrast CT of the chest was initially obtained.

[Series 5: axial arterial · axial · arterial · 0.98mm/px · z∈[+821,+1454]mm · 10 of 243 slices shown, 12 images]
[im 16/243  soft-tissue]
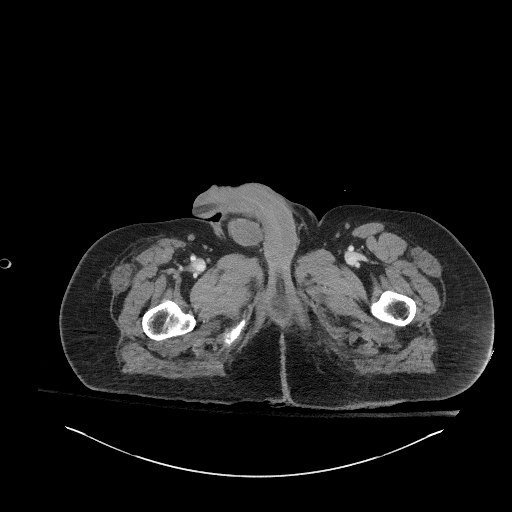
[im 16/243  bone]
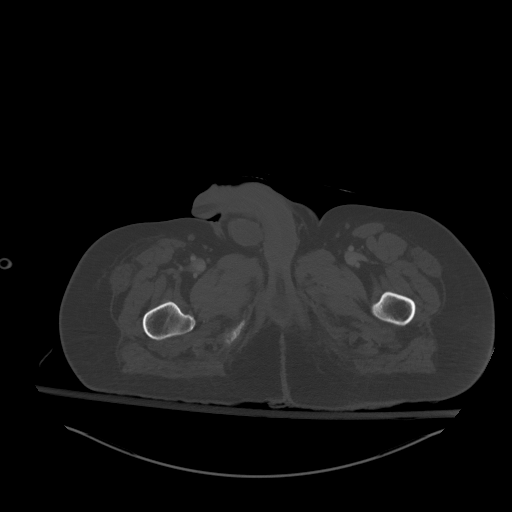
[im 46/243  soft-tissue]
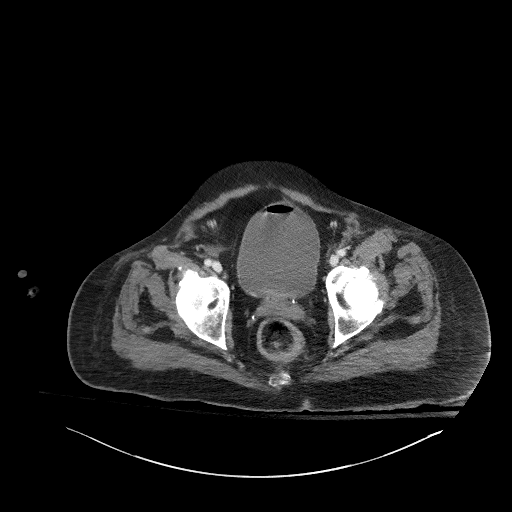
[im 61/243  soft-tissue]
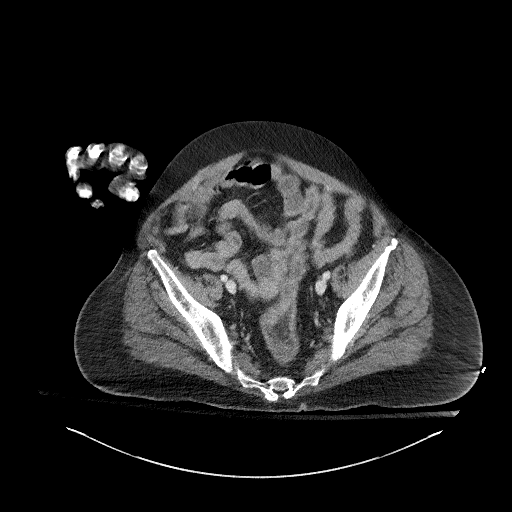
[im 91/243  soft-tissue]
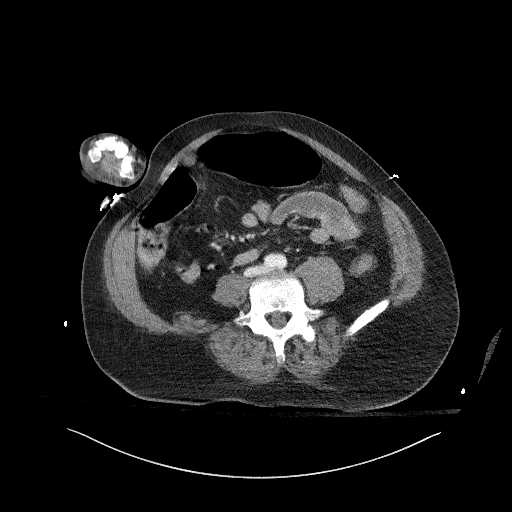
[im 106/243  soft-tissue]
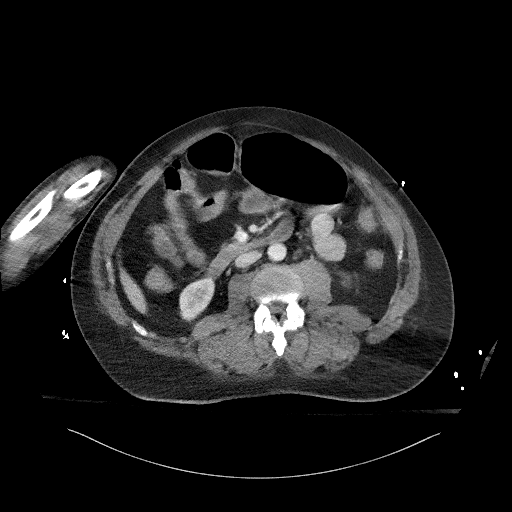
[im 137/243  soft-tissue]
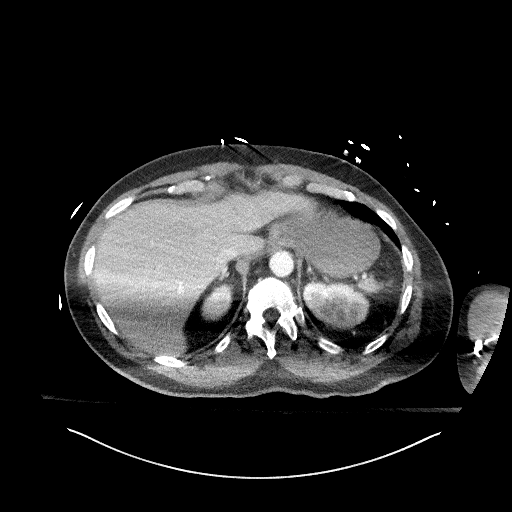
[im 152/243  soft-tissue]
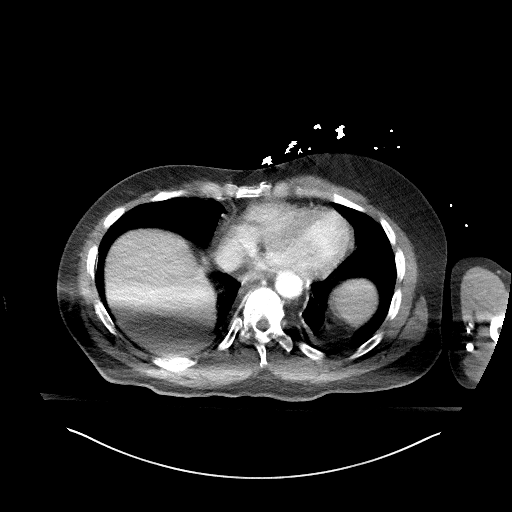
[im 182/243  soft-tissue]
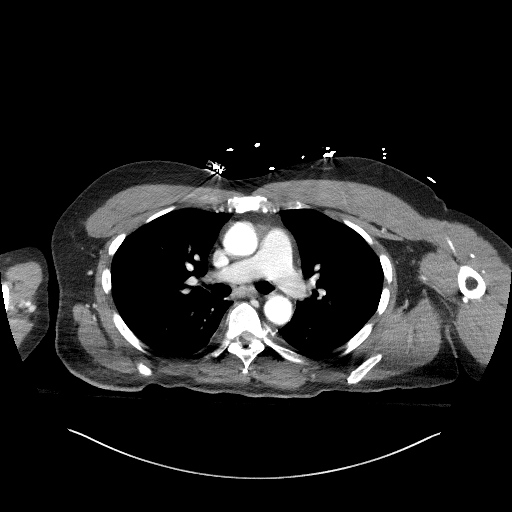
[im 197/243  soft-tissue]
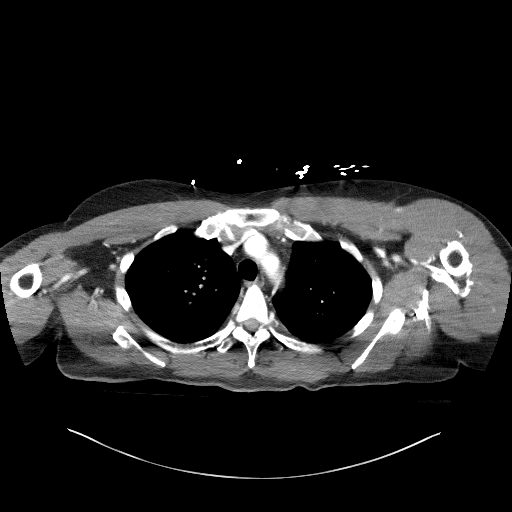
[im 197/243  bone]
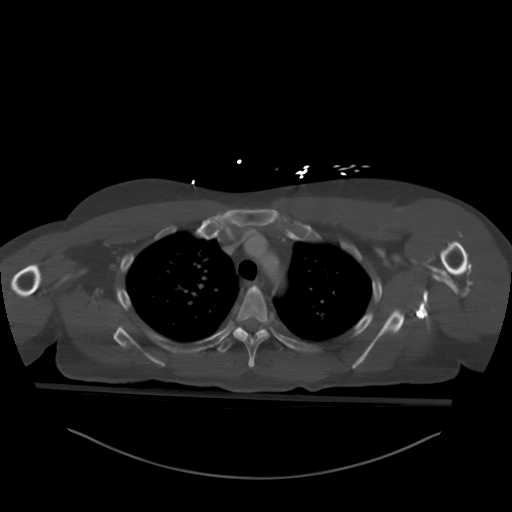
[im 227/243  soft-tissue]
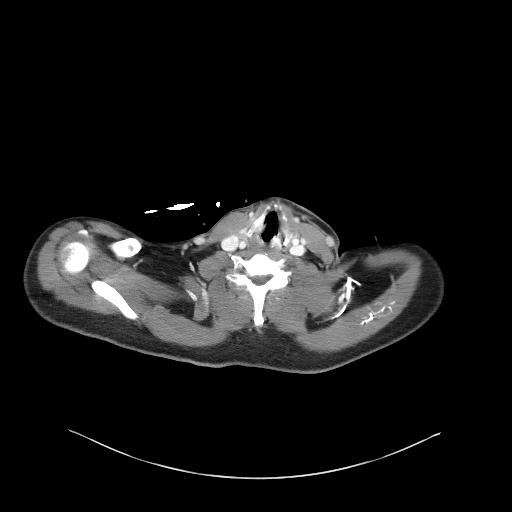

[Series 9: cor soft · coronal · 1.00mm/px · 3 of 194 slices shown]
[im 49/194  soft-tissue]
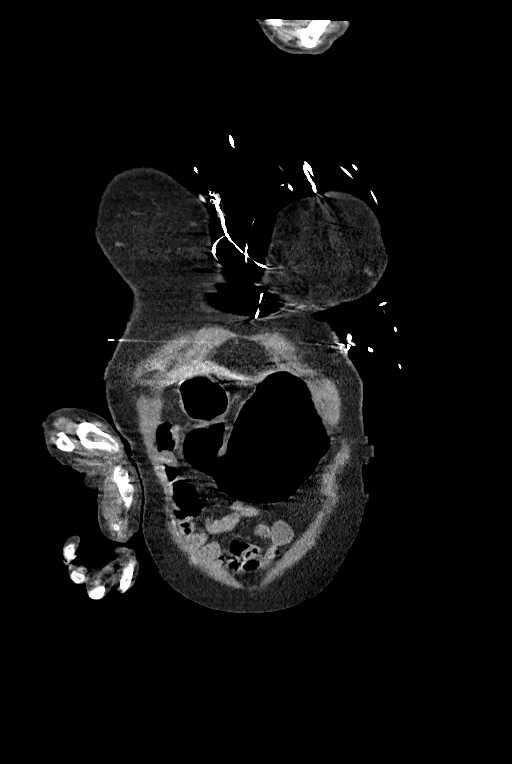
[im 97/194  soft-tissue]
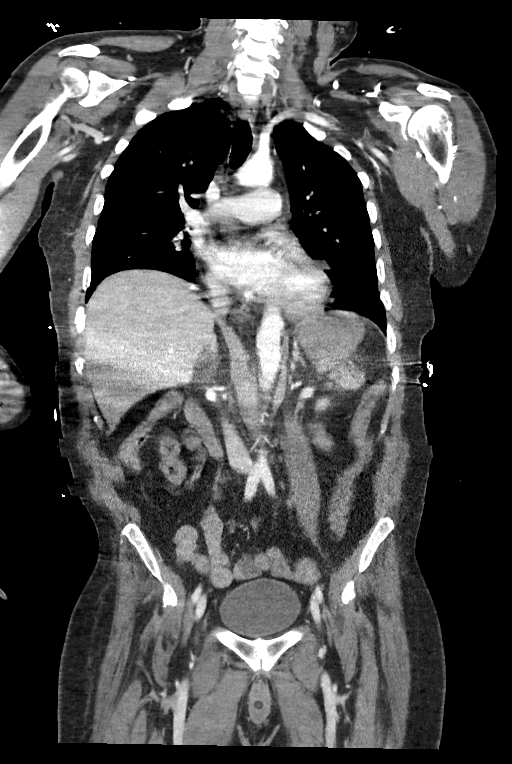
[im 145/194  soft-tissue]
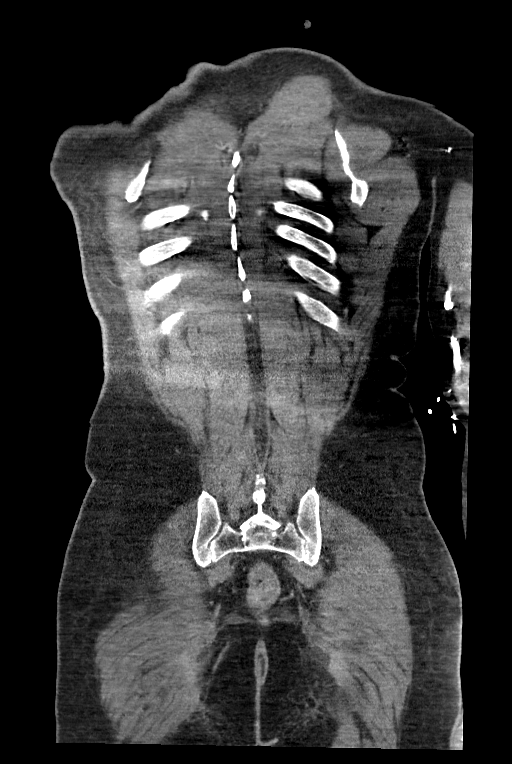

[13 of 46 positions shown; findings below may reference images not displayed]

Multidetector CT imaging through the chest, abdomen and pelvis was
performed using the standard protocol during bolus administration of
intravenous contrast. Multiplanar reconstructed images and MIPs were
obtained and reviewed to evaluate the vascular anatomy.

CONTRAST:  100mL OMNIPAQUE IOHEXOL 350 MG/ML SOLN
FINDINGS: CTA CHEST FINDINGS

Cardiovascular: Exam is somewhat degraded by motion artifact. There
is no evidence for thoracic aortic dissection or aneurysm. No
definite large centrally located pulmonary embolism. The heart size
is stable from prior study. There is no significant pericardial
effusion.

Mediastinum/Nodes:

-- No mediastinal lymphadenopathy.

-- No hilar lymphadenopathy.

-- No axillary lymphadenopathy.

-- No supraclavicular lymphadenopathy.

-- Normal thyroid gland where visualized.

-  Unremarkable esophagus.

Lungs/Pleura: There are linear airspace opacities at the lung bases.
No large pleural effusion. No pneumothorax.

Musculoskeletal: No chest wall abnormality. No bony spinal canal
stenosis.

Review of the MIP images confirms the above findings.

CTA ABDOMEN AND PELVIS FINDINGS

VASCULAR

Aorta: Normal caliber aorta without aneurysm, dissection, vasculitis
or significant stenosis.

Celiac: Patent without evidence of aneurysm, dissection, vasculitis
or significant stenosis.

SMA: Patent without evidence of aneurysm, dissection, vasculitis or
significant stenosis.

Renals: Both renal arteries are patent without evidence of aneurysm,
dissection, vasculitis, fibromuscular dysplasia or significant
stenosis.

IMA: Patent without evidence of aneurysm, dissection, vasculitis or
significant stenosis.

Inflow: Patent without evidence of aneurysm, dissection, vasculitis
or significant stenosis.

Veins: No obvious venous abnormality within the limitations of this
arterial phase study. There are pockets of gas within the right
common femoral vein which may related to venous injection of air
during contrast administration.

Review of the MIP images confirms the above findings.

NON-VASCULAR

Lower chest: The lung bases are clear. The heart size is normal.

Hepatobiliary: The liver is normal. Normal gallbladder.There is no
biliary ductal dilation.

Pancreas: Normal contours without ductal dilatation. No
peripancreatic fluid collection.

Spleen: Unremarkable.

Adrenals/Urinary Tract:

--Adrenal glands: Unremarkable.

--Right kidney/ureter: No hydronephrosis or radiopaque kidney
stones.

--Left kidney/ureter: No hydronephrosis or radiopaque kidney stones.

--Urinary bladder: There is gas within the urinary bladder,
presumably from prior instrumentation.

Stomach/Bowel:

--Stomach/Duodenum: No hiatal hernia or other gastric abnormality.
Normal duodenal course and caliber.

--Small bowel: Unremarkable.

--Colon: There is mild rectal wall thickening which is somewhat
similar to prior study.

--Appendix: Normal.

Vascular/Lymphatic: Normal course and caliber of the major abdominal
vessels.

--No retroperitoneal lymphadenopathy.

--No mesenteric lymphadenopathy.

--No pelvic or inguinal lymphadenopathy.

Reproductive: The Foley catheter bulb is inflated were proximal
urethra. There are adjacent pockets of gas and free fluid (axial
series 5, image 213). There is a left testicular prosthesis.

Other: No ascites or free air. There is a fat and fluid containing
right inguinal hernia.

Musculoskeletal. No acute displaced fractures.

Review of the MIP images confirms the above findings.
IMPRESSION: 1. Motion degraded study.
2. Malpositioned Foley catheter bulb, currently CT weighted in the
membranous part of the urethra. There are adjacent pockets of gas
and free fluid concerning for a traumatic urethral injury.
Repositioning is recommended. Urology consultation should be
considered.
3. No evidence for an aortic dissection.  No acute abnormality.
4. Chronic findings as detailed above, not substantially changed
from recent prior studies.

These results will be called to the ordering clinician or
representative by the Radiologist Assistant, and communication
documented in the PACS or [REDACTED].

## 2020-12-29 IMAGING — DX DG CHEST 1V PORT
1 series · 1 of 1 positions shown · non-contrast
Comparison: [DATE]

CLINICAL DATA: Central line placement

EXAM:
PORTABLE CHEST 1 VIEW

[chest ap]
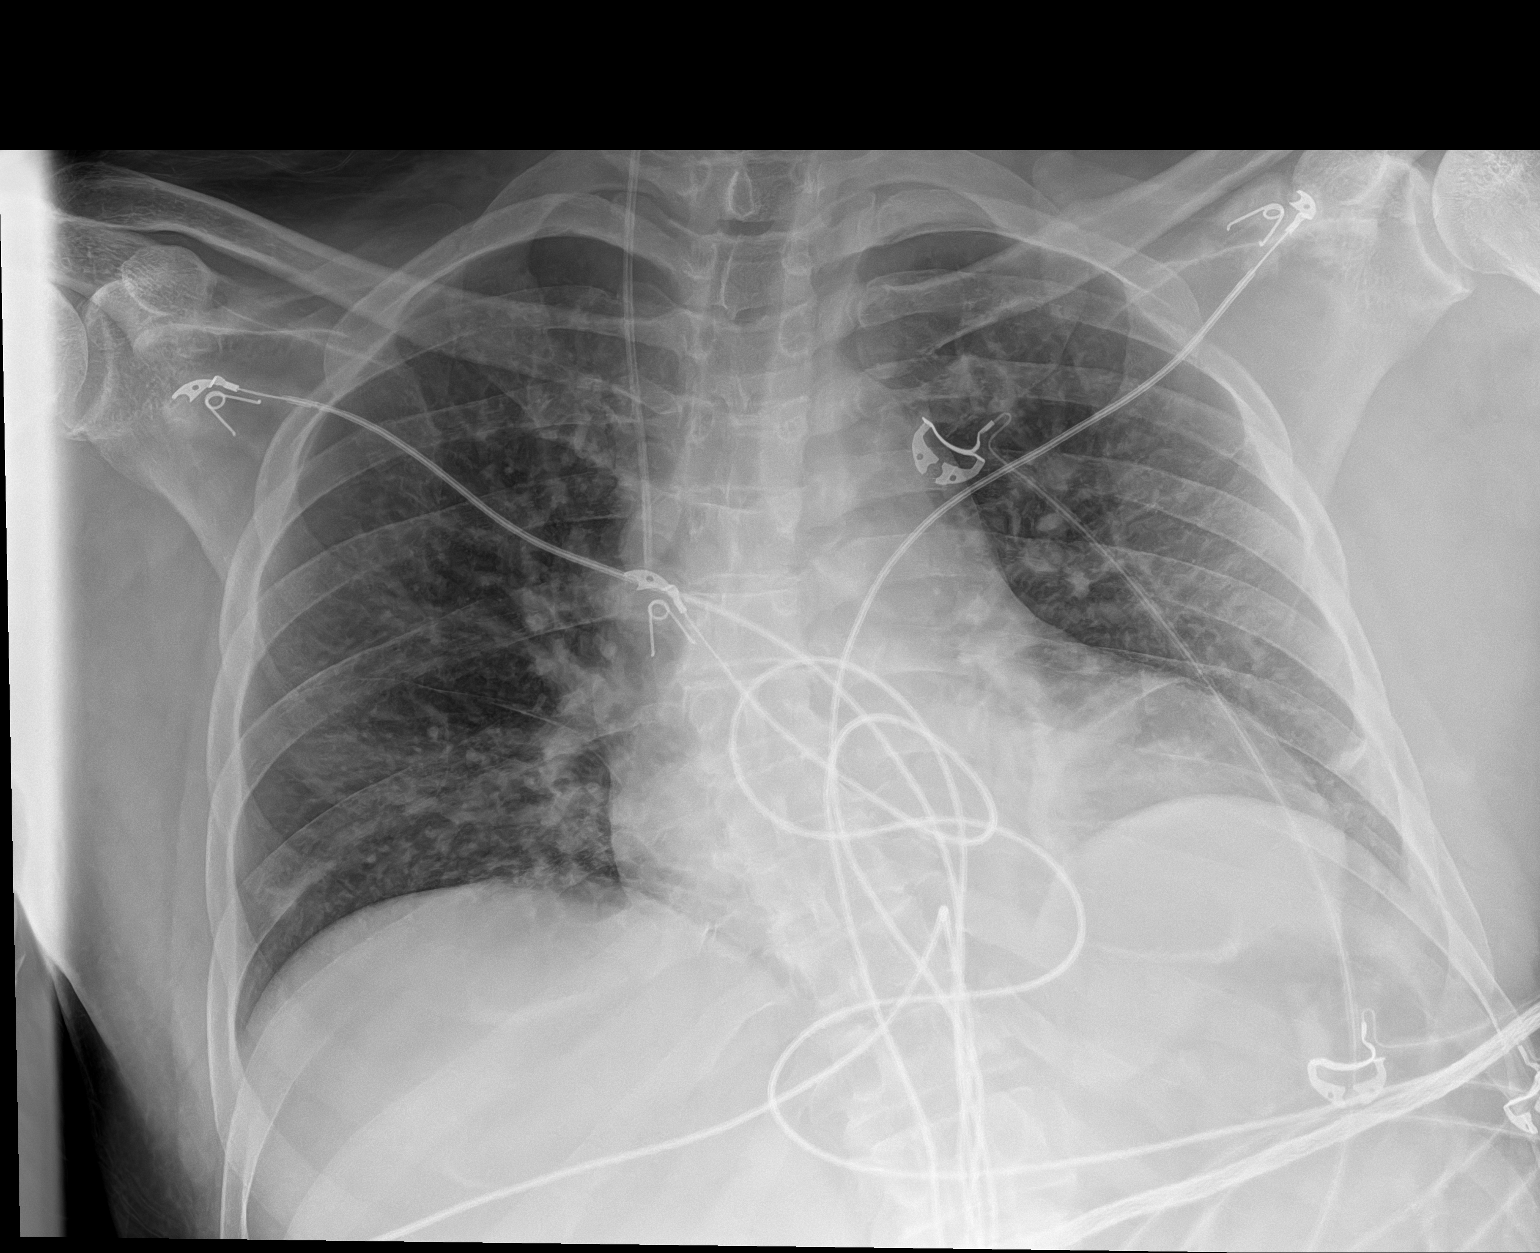

[1 of 1 positions shown; findings below may reference images not displayed]

FINDINGS: A right central line terminates in the central SVC. No pneumothorax.
The platelike opacity in left base is better seen on the previous
study due to rotation on the current study. However, this persistent
is likely atelectasis. Mild oval opacity in the periphery of the
right lung base was not seen earlier today. No other acute
abnormalities.
IMPRESSION: 1. The right central line terminates in the central SVC without
pneumothorax.
2. New oval opacity in the lateral right lung base could represent
atelectasis or developing infiltrate. Recommend attention on
short-term follow-up.
3. Persistent opacity in the left base is better seen earlier today.
Earlier today, this opacity appeared to represent atelectasis.

## 2020-12-29 IMAGING — DX DG CHEST 1V PORT
1 series · 1 of 1 positions shown · non-contrast
Comparison: [DATE]

CLINICAL DATA: Questionable sepsis.

EXAM:
PORTABLE CHEST 1 VIEW

[chest ap]
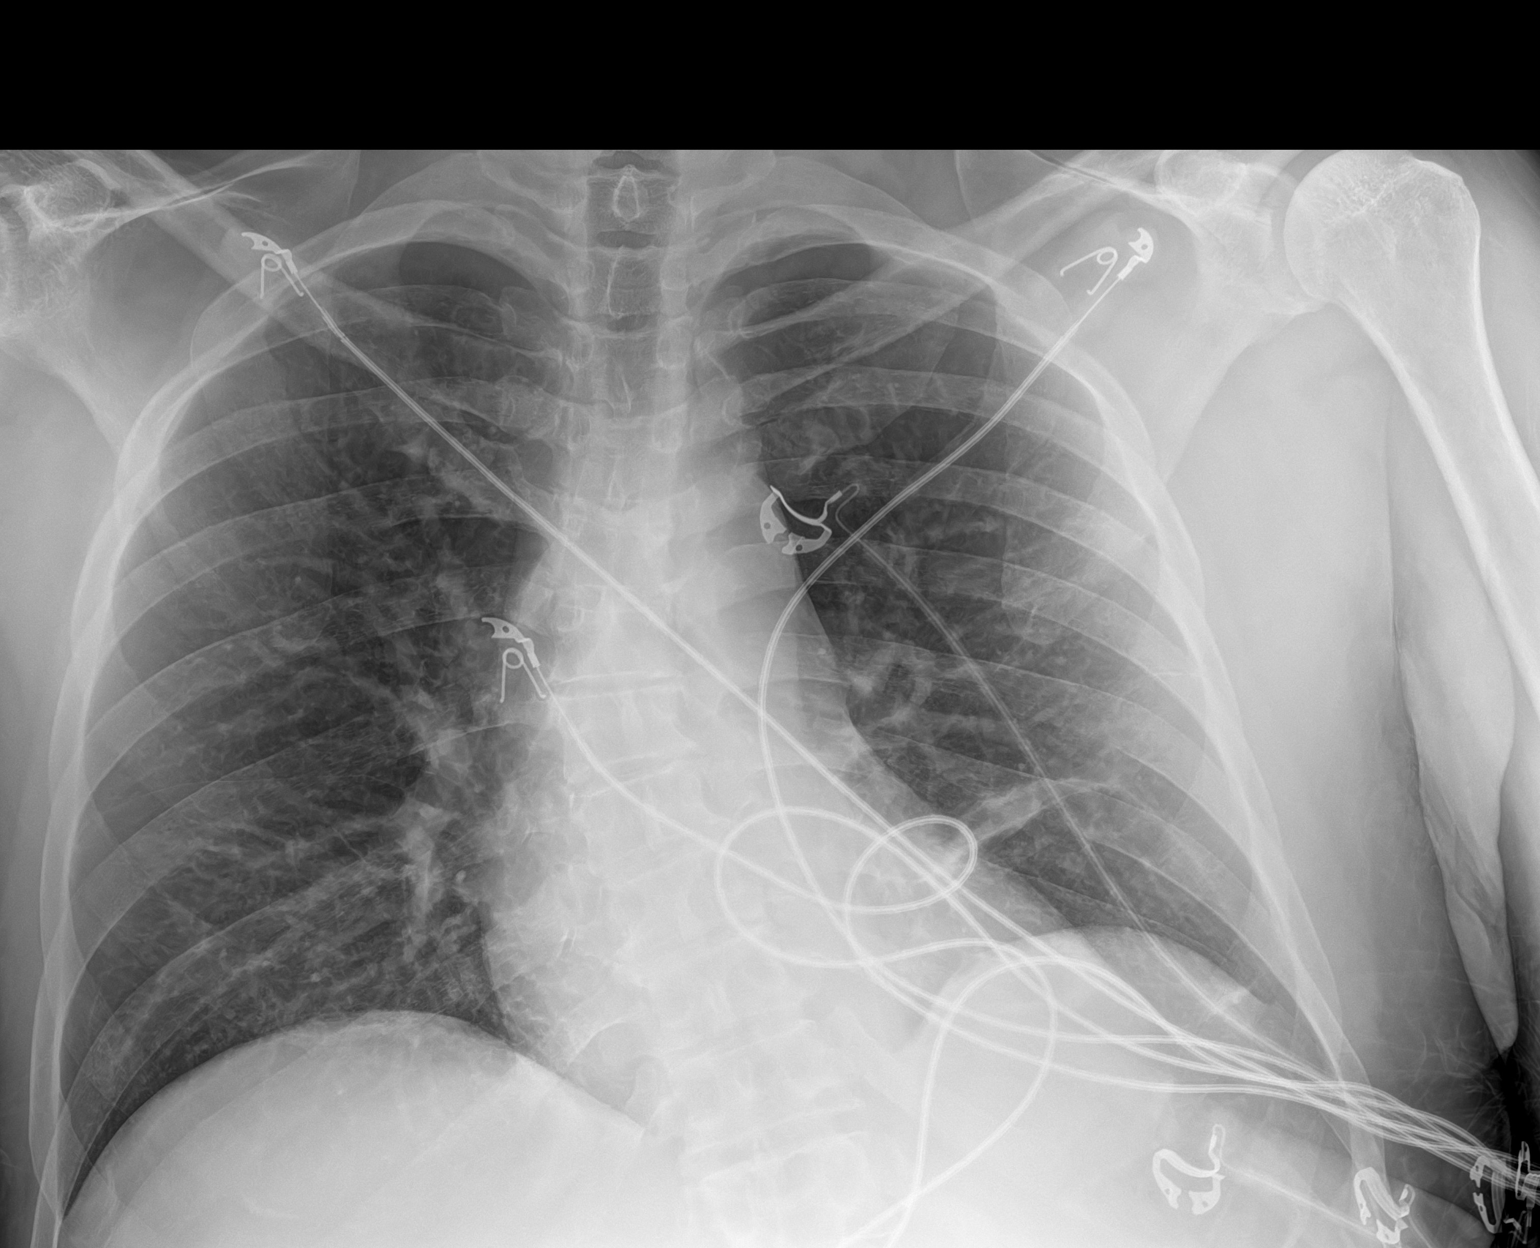

[1 of 1 positions shown; findings below may reference images not displayed]

FINDINGS: Platelike opacity in left base is likely atelectasis. The heart,
hila, mediastinum, lungs, and pleura are otherwise unremarkable.
IMPRESSION: Platelike opacity in left base is likely subsegmental atelectasis.
No other abnormalities.

## 2020-12-29 MED ORDER — LORAZEPAM 2 MG/ML IJ SOLN
INTRAMUSCULAR | Status: AC
  Administered 2020-12-29: 2 mg via INTRAVENOUS
  Filled 2020-12-29: qty 1

## 2020-12-29 MED ORDER — LACTATED RINGERS IV SOLN: INTRAVENOUS | Status: DC

## 2020-12-29 MED ORDER — ROCURONIUM BROMIDE 50 MG/5ML IV SOLN
100.0000 mg | Freq: Once | INTRAVENOUS | Status: DC
  Filled 2020-12-29: qty 10

## 2020-12-29 MED ORDER — HYDROCORTISONE NA SUCCINATE PF 100 MG IJ SOLR
100.0000 mg | Freq: Once | INTRAMUSCULAR | Status: AC
  Administered 2020-12-29: 100 mg via INTRAVENOUS
  Filled 2020-12-29: qty 2

## 2020-12-29 MED ORDER — LACTATED RINGERS IV BOLUS
1000.0000 mL | Freq: Once | INTRAVENOUS | Status: AC
  Administered 2020-12-29: 1000 mL via INTRAVENOUS

## 2020-12-29 MED ORDER — IOHEXOL 350 MG/ML SOLN
100.0000 mL | Freq: Once | INTRAVENOUS | Status: AC | PRN
  Administered 2020-12-29: 100 mL via INTRAVENOUS

## 2020-12-29 MED ORDER — ACETAMINOPHEN 500 MG PO TABS
1000.0000 mg | ORAL_TABLET | Freq: Once | ORAL | Status: DC
  Filled 2020-12-29: qty 2

## 2020-12-29 MED ORDER — METRONIDAZOLE IN NACL 5-0.79 MG/ML-% IV SOLN
500.0000 mg | Freq: Once | INTRAVENOUS | Status: AC
  Administered 2020-12-29: 500 mg via INTRAVENOUS
  Filled 2020-12-29: qty 100

## 2020-12-29 MED ORDER — NOREPINEPHRINE 4 MG/250ML-% IV SOLN
INTRAVENOUS | Status: AC
  Administered 2020-12-29: 2 ug/min via INTRAVENOUS
  Filled 2020-12-29: qty 250

## 2020-12-29 MED ORDER — VANCOMYCIN HCL IN DEXTROSE 1-5 GM/200ML-% IV SOLN
1000.0000 mg | Freq: Once | INTRAVENOUS | Status: AC
  Administered 2020-12-29: 1000 mg via INTRAVENOUS
  Filled 2020-12-29: qty 200

## 2020-12-29 MED ORDER — SODIUM CHLORIDE 0.9 % IV BOLUS (SEPSIS)
1000.0000 mL | Freq: Once | INTRAVENOUS | Status: AC
  Administered 2020-12-29: 1000 mL via INTRAVENOUS

## 2020-12-29 MED ORDER — SODIUM CHLORIDE 0.9 % IV SOLN
2.0000 g | Freq: Once | INTRAVENOUS | Status: AC
  Administered 2020-12-29: 2 g via INTRAVENOUS
  Filled 2020-12-29: qty 2

## 2020-12-29 MED ORDER — ACETAMINOPHEN 10 MG/ML IV SOLN
1000.0000 mg | Freq: Once | INTRAVENOUS | Status: AC
  Administered 2020-12-29: 1000 mg via INTRAVENOUS
  Filled 2020-12-29: qty 100

## 2020-12-29 MED ORDER — SODIUM CHLORIDE 0.9 % IV BOLUS
1000.0000 mL | Freq: Once | INTRAVENOUS | Status: AC
  Administered 2020-12-29: 1000 mL via INTRAVENOUS

## 2020-12-29 MED ORDER — SORBITOL 70 % SOLN
960.0000 mL | TOPICAL_OIL | Freq: Once | ORAL | Status: DC
  Filled 2020-12-29: qty 473

## 2020-12-29 MED ORDER — LORAZEPAM 2 MG/ML IJ SOLN: 2.0000 mg | Freq: Once | INTRAMUSCULAR | Status: AC

## 2020-12-29 MED ORDER — VASOPRESSIN 20 UNITS/100 ML INFUSION FOR SHOCK
0.0000 [IU]/min | INTRAVENOUS | Status: DC
  Administered 2020-12-29 – 2020-12-31 (×4): 0.03 [IU]/min via INTRAVENOUS
  Filled 2020-12-29 (×5): qty 100

## 2020-12-29 MED ORDER — ORAL CARE MOUTH RINSE
15.0000 mL | Freq: Two times a day (BID) | OROMUCOSAL | Status: DC
  Administered 2020-12-30 – 2021-01-06 (×13): 15 mL via OROMUCOSAL

## 2020-12-29 MED ORDER — NOREPINEPHRINE 4 MG/250ML-% IV SOLN: 2.0000 ug/min | INTRAVENOUS | Status: DC

## 2020-12-29 MED ORDER — VANCOMYCIN HCL IN DEXTROSE 1-5 GM/200ML-% IV SOLN
1000.0000 mg | Freq: Once | INTRAVENOUS | Status: AC
  Administered 2020-12-29 (×2): 1000 mg via INTRAVENOUS
  Filled 2020-12-29: qty 200

## 2020-12-29 MED ORDER — NOREPINEPHRINE 4 MG/250ML-% IV SOLN
0.0000 ug/min | INTRAVENOUS | Status: DC
  Administered 2020-12-29: 30 ug/min via INTRAVENOUS
  Administered 2020-12-30: 27 ug/min via INTRAVENOUS
  Filled 2020-12-29 (×3): qty 250

## 2020-12-29 MED ORDER — VASOPRESSIN 20 UNITS/100 ML INFUSION FOR SHOCK
INTRAVENOUS | Status: AC
  Filled 2020-12-29: qty 100

## 2020-12-29 MED ORDER — SODIUM CHLORIDE 0.9 % IV SOLN: INTRAVENOUS | Status: DC | PRN

## 2020-12-29 MED ORDER — CHLORHEXIDINE GLUCONATE 0.12 % MT SOLN
15.0000 mL | Freq: Two times a day (BID) | OROMUCOSAL | Status: DC
  Administered 2020-12-30 – 2021-01-06 (×16): 15 mL via OROMUCOSAL
  Filled 2020-12-29 (×13): qty 15

## 2020-12-29 MED ORDER — ETOMIDATE 2 MG/ML IV SOLN: 20.0000 mg | Freq: Once | INTRAVENOUS | Status: DC

## 2020-12-29 MED ORDER — SODIUM CHLORIDE 0.9 % IV SOLN
10.0000 mL/h | Freq: Once | INTRAVENOUS | Status: AC
  Administered 2020-12-29: 10 mL/h via INTRAVENOUS

## 2020-12-29 NOTE — ED Notes (Signed)
Report given to [facility]. 

## 2020-12-29 NOTE — ED Notes (Addendum)
Pt actively seizing at this time. EDP made aware and at bedside. Pt being bagged at this time. Equal rise and fall of the chest noted. Respiratory at bedside.

## 2020-12-29 NOTE — ED Notes (Signed)
Larita Fife RN from [facility] updated on Pt status.

## 2020-12-29 NOTE — ED Provider Notes (Signed)
[facility] EMERGENCY DEPARTMENT Provider Note   CSN: [id number] Arrival date & time: 12/29/20  1444     History Chief Complaint  Patient presents with  . foley catheter problem    Vincent Anthony is a 50 y.o. male.  HPI 50 year old male with a history of MS and chronic Foley catheter presents with penile bleeding.  Staff was changing his Foley catheter per typical routine and while pulling the Foley catheter out he started bleeding significantly.  They did not put in another catheter and instead held pressure and now the bleeding seems to have stopped.  The patient otherwise systemically feels well with no dizziness or other systemic symptoms.  He is endorsing some right-sided abdominal pain which he states he always gets whenever he has constipation.  His last bowel movement has been a week ago and that was after an enema that produced only a small amount.  His abdomen is distended. No penile pain.   Past Medical History:  Diagnosis Date  . Arthritis   . Elevated LFTs   . Elevated liver enzymes   . GERD (gastroesophageal reflux disease)   . Headache   . MS (multiple sclerosis) (HCC)   . Tremor     Patient Active Problem List   Diagnosis Date Noted  . Septic shock (HCC) 12/29/2020  . Seizures (HCC) 11/18/2020  . Hyponatremia 11/18/2020  . AKI (acute kidney injury) (HCC) 11/18/2020  . Acute respiratory failure with hypoxia (HCC) 11/18/2020  . Pneumonia due to COVID-19 virus 11/17/2020  . MS (multiple sclerosis) (HCC) 03/08/2018  . 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 History of urinary stone 02/03/2018  . Urge incontinence of urine 02/03/2018  . Abdominal pain 01/05/2017  . Elevated LFTs 11/05/2016  . Neuropathic pain 05/23/2015  . Arthralgia of both knees 05/23/2015  . Urinary tract stones 01/03/2015  . Chronic pain syndrome 03/20/2014  . 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 Bilateral knee pain 01/29/2013  . 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 Ongoing  use of possibly toxic medication 10/11/2012  . Acne 01/13/2012  . Seborrheic dermatitis 01/13/2012  . Neurogenic bladder 11/13/2011  . Urinary urgency 11/13/2011    Past Surgical History:  Procedure Laterality Date  . COLONOSCOPY     2-4 years ago  . HERNIA REPAIR    . KNEE SURGERY    . TESTICLE REMOVAL         Family History  Problem Relation Age of Onset  . Heart attack Mother   . Diabetes Father   . Hypertension Father   . Colon cancer Neg Hx     Social History   Tobacco Use  . Smoking status: Never Smoker  . Smokeless tobacco: Never Used  Substance Use Topics  . Alcohol use: No    Comment: occasionally  . Drug use: No    Home Medications Prior to Admission medications   Medication Sig Start Date End Date Taking? Authorizing Provider  amitriptyline (ELAVIL) 10 MG tablet Take 1 tablet by mouth at bedtime.  11/09/14  Yes [provider]  baclofen (LIORESAL) 10 MG tablet Take 1 tablet by mouth 3 (three) times daily. 11/09/14  Yes [provider]  cetirizine (ZYRTEC) 10 MG tablet Take 10 mg by mouth daily.   Yes [provider]  Cholecalciferol (VITAMIN D3) 50 MCG (2000 UT) TABS Take 2,000 Units by mouth in the morning.   Yes [provider]  clonazePAM (KLONOPIN) 1 MG tablet Take 1 tablet (1 mg total) by mouth 2 (  two) times daily. Or as needed 10/31/20  Yes Terrilee FilesButler, Michael C, MD  hyoscyamine (LEVBID) 0.375 MG 12 hr tablet TAKE (1) TABLET BYMOUTH EVERY TWELVE HOURS. 09/16/20  Yes Gelene MinkBoone, Anna W, NP  levETIRAcetam (KEPPRA) 250 MG tablet Take 1 tablet by mouth 2 (two) times daily. 11/09/14  Yes [provider]  lubiprostone (AMITIZA) 8 MCG capsule Take 8 mcg by mouth 2 (two) times daily with a meal.   Yes [provider]  metFORMIN (GLUCOPHAGE) 500 MG tablet Take 500 mg by mouth 2 (two) times daily.   Yes [provider]  oxybutynin (DITROPAN-XL) 10 MG 24 hr tablet Take 10 mg by mouth daily.   Yes [provider]  pravastatin (PRAVACHOL) 20 MG tablet Take 20 mg by mouth every morning.    Yes [provider]  predniSONE (DELTASONE) 20 MG tablet Take 40 mg by mouth daily. 12/21/20  Yes [provider]  pregabalin (LYRICA) 150 MG capsule Take 150 mg by mouth at bedtime. 12/23/20  Yes [provider]  pregabalin (LYRICA) 75 MG capsule Take 75 mg by mouth See admin instructions. 1 capsule in morning and 2 capsules at bedtime   Yes [provider]  senna (SENOKOT) 8.6 MG tablet Take 2 tablets by mouth 2 (two) times daily.   Yes [provider]  acetaminophen (TYLENOL) 325 MG tablet Take 650 mg by mouth every 6 (six) hours as needed. Patient not taking: Reported on 12/29/2020    [provider]  Coenzyme Q10 (CO Q-10) 100 MG CAPS Take 1 capsule by mouth daily. Patient not taking: Reported on 12/29/2020    [provider]  cyclobenzaprine (FLEXERIL) 5 MG tablet Take 5 mg by mouth 3 (three) times daily as needed. Patient not taking: Reported on 12/29/2020    [provider]  fluticasone (FLONASE) 50 MCG/ACT nasal spray Place 2 sprays into both nostrils daily as needed for allergies or rhinitis. Patient not taking: Reported on 12/29/2020    [provider]  GNP FIBER-CAPS 625 MG tablet Take 625 mg by mouth daily.  Patient not taking: Reported on 12/29/2020 05/03/18   [provider]  linaclotide Karlene Einstein(LINZESS) 145 MCG CAPS capsule Take 1 capsule (145 mcg total) by mouth daily before breakfast. 11/19/20   Erick BlinksMemon, Jehanzeb, MD  Ocrelizumab (OCREVUS IV) Inject into the vein. Every 6 months for MS    [provider]  sodium phosphate (FLEET) 7-19 GM/118ML ENEM Place 1 enema rectally daily as needed for severe constipation. Patient not taking: Reported on 12/29/2020    [provider]  sulfamethoxazole-trimethoprim (BACTRIM DS) 800-160 MG tablet Take 1 tablet by mouth 2 (two) times daily. Patient not taking: Reported on  12/29/2020 12/09/20   [provider]  VESICARE 10 MG tablet Take 1 tablet by mouth every morning.  Patient not taking: Reported on 12/29/2020 11/09/14   [provider]    Allergies    Celebrex [celecoxib], Vioxx [rofecoxib], Zanaflex [tizanidine hcl], and Clindamycin  Review of Systems   Review of Systems  Gastrointestinal: Positive for abdominal distention, abdominal pain and constipation.  Genitourinary: Positive for hematuria.  All other systems reviewed and are negative.   Physical Exam Updated Vital Signs BP 93/66 (BP Location: Right Arm)   Pulse (!) 140   Temp (!) 100.6 F (38.1 C) (Oral)   Resp (!) 47   Wt 99.8 kg   SpO2 98%   BMI 27.50 kg/m   Physical Exam Vitals and nursing note reviewed. Exam  conducted with a chaperone present.  Constitutional:      Appearance: He is well-developed.  HENT:     Head: Normocephalic and atraumatic.     Right Ear: External ear normal.     Left Ear: External ear normal.     Nose: Nose normal.  Eyes:     General:        Right eye: No discharge.        Left eye: No discharge.  Cardiovascular:     Rate and Rhythm: Normal rate and regular rhythm.     Heart sounds: Normal heart sounds.  Pulmonary:     Effort: Pulmonary effort is normal.     Breath sounds: Normal breath sounds.  Abdominal:     General: There is distension.     Palpations: Abdomen is soft.     Tenderness: There is no abdominal tenderness.  Genitourinary:    Penis: No tenderness or swelling.      Testes:        Right: Tenderness or swelling not present.        Left: Tenderness or swelling not present.     Comments: Blood at the urethral meatus. Otherwise is not actively bleeding On rectal exam there is soft light brown stool, but no impaction or at least no significant stool close enough for manual disimpaction. Musculoskeletal:     Cervical back: Neck supple.  Skin:    General: Skin is warm and dry.  Neurological:     Mental Status: He is  alert.  Psychiatric:        Mood and Affect: Mood is not anxious.     ED Results / Procedures / Treatments   Labs (all labs ordered are listed, but only abnormal results are displayed) Labs Reviewed  LACTIC ACID, PLASMA - Abnormal; Notable for the following components:      Result Value   Lactic Acid, Venous 5.7 (*)    All other components within normal limits  LACTIC ACID, PLASMA - Abnormal; Notable for the following components:   Lactic Acid, Venous 5.6 (*)    All other components within normal limits  COMPREHENSIVE METABOLIC PANEL - Abnormal; Notable for the following components:   Glucose, Bld 132 (*)    Creatinine, Ser 1.50 (*)    Calcium 8.8 (*)    Total Protein 6.0 (*)    Albumin 3.2 (*)    GFR, Estimated 56 (*)    All other components within normal limits  CBC WITH DIFFERENTIAL/PLATELET - Abnormal; Notable for the following components:   Hemoglobin 11.9 (*)    HCT 37.8 (*)    RDW 17.2 (*)    Lymphs Abs 0.1 (*)    Monocytes Absolute 0.0 (*)    All other components within normal limits  APTT - Abnormal; Notable for the following components:   aPTT 22 (*)    All other components within normal limits  URINALYSIS, ROUTINE W REFLEX MICROSCOPIC - Abnormal; Notable for the following components:   Hgb urine dipstick SMALL (*)    Leukocytes,Ua TRACE (*)    Bacteria, UA RARE (*)    All other components within normal limits  HEMOGLOBIN AND HEMATOCRIT, BLOOD - Abnormal; Notable for the following components:   Hemoglobin 8.4 (*)    HCT 26.8 (*)    All other components within normal limits  CBG MONITORING, ED - Abnormal; Notable for the following components:   Glucose-Capillary 118 (*)    All other components within normal limits  CULTURE,  BLOOD (SINGLE)  CULTURE, BLOOD (SINGLE)  URINE CULTURE  C DIFFICILE QUICK SCREEN W PCR REFLEX  PROTIME-INR  TYPE AND SCREEN  PREPARE RBC (CROSSMATCH)  ABO/RH    EKG EKG Interpretation  Date/Time:  Sunday December 29 2020 17:46:54  EDT Ventricular Rate:  136 PR Interval:  113 QRS Duration: 75 QT Interval:  262 QTC Calculation: 394 R Axis:   117 Text Interpretation: Sinus tachycardia Multiform ventricular premature complexes Right axis deviation overall similar to Feb 2022 Confirmed by Pricilla Loveless [id number]) on 12/29/2020 6:09:19 PM   Radiology DG Chest Portable 1 View  Result Date: 12/29/2020 CLINICAL DATA:  Central line placement EXAM: PORTABLE CHEST 1 VIEW COMPARISON:  December 30, 2015 FINDINGS: A right central line terminates in the central SVC. No pneumothorax. The platelike opacity in left base is better seen on the previous study due to rotation on the current study. However, this persistent is likely atelectasis. Mild oval opacity in the periphery of the right lung base was not seen earlier today. No other acute abnormalities. IMPRESSION: 1. The right central line terminates in the central SVC without pneumothorax. 2. New oval opacity in the lateral right lung base could represent atelectasis or developing infiltrate. Recommend attention on short-term follow-up. 3. Persistent opacity in the left base is better seen earlier today. Earlier today, this opacity appeared to represent atelectasis. Electronically Signed   By: Gerome Sam III M.D   On: 12/29/2020 19:26   DG Chest Port 1 View  Result Date: 12/29/2020 CLINICAL DATA:  Questionable sepsis. EXAM: PORTABLE CHEST 1 VIEW COMPARISON:  November 17, 2020 FINDINGS: Platelike opacity in left base is likely atelectasis. The heart, hila, mediastinum, lungs, and pleura are otherwise unremarkable. IMPRESSION: Platelike opacity in left base is likely subsegmental atelectasis. No other abnormalities. Electronically Signed   By: Gerome Sam III M.D   On: 12/29/2020 17:38   CT Angio Chest/Abd/Pel for Dissection W and/or Wo Contrast  Result Date: 12/29/2020 CLINICAL DATA:  Abdominal pain. Chest pain with concern for dissection. EXAM: CT ANGIOGRAPHY CHEST, ABDOMEN AND PELVIS  TECHNIQUE: Non-contrast CT of the chest was initially obtained. Multidetector CT imaging through the chest, abdomen and pelvis was performed using the standard protocol during bolus administration of intravenous contrast. Multiplanar reconstructed images and MIPs were obtained and reviewed to evaluate the vascular anatomy. CONTRAST:  OMNIPAQUE IOHEXOL 350 MG/ML SOLN COMPARISON:  CT dated 10/29/2020 FINDINGS: CTA CHEST FINDINGS Cardiovascular: Exam is somewhat degraded by motion artifact. There is no evidence for thoracic aortic dissection or aneurysm. No definite large centrally located pulmonary embolism. The heart size is stable from prior study. There is no significant pericardial effusion. Mediastinum/Nodes: -- No mediastinal lymphadenopathy. -- No hilar lymphadenopathy. -- No axillary lymphadenopathy. -- No supraclavicular lymphadenopathy. -- Normal thyroid gland where visualized. -  Unremarkable esophagus. Lungs/Pleura: There are linear airspace opacities at the lung bases. No large pleural effusion. No pneumothorax. Musculoskeletal: No chest wall abnormality. No bony spinal canal stenosis. Review of the MIP images confirms the above findings. CTA ABDOMEN AND PELVIS FINDINGS VASCULAR Aorta: Normal caliber aorta without aneurysm, dissection, vasculitis or significant stenosis. Celiac: Patent without evidence of aneurysm, dissection, vasculitis or significant stenosis. SMA: Patent without evidence of aneurysm, dissection, vasculitis or significant stenosis. Renals: Both renal arteries are patent without evidence of aneurysm, dissection, vasculitis, fibromuscular dysplasia or significant stenosis. IMA: Patent without evidence of aneurysm, dissection, vasculitis or significant stenosis. Inflow: Patent without evidence of aneurysm, dissection, vasculitis or significant stenosis. Veins: No obvious venous abnormality  within the limitations of this arterial phase study. There are pockets of gas within the right  common femoral vein which may related to venous injection of air during contrast administration. Review of the MIP images confirms the above findings. NON-VASCULAR Lower chest: The lung bases are clear. The heart size is normal. Hepatobiliary: The liver is normal. Normal gallbladder.There is no biliary ductal dilation. Pancreas: Normal contours without ductal dilatation. No peripancreatic fluid collection. Spleen: Unremarkable. Adrenals/Urinary Tract: --Adrenal glands: Unremarkable. --Right kidney/ureter: No hydronephrosis or radiopaque kidney stones. --Left kidney/ureter: No hydronephrosis or radiopaque kidney stones. --Urinary bladder: There is gas within the urinary bladder, presumably from prior instrumentation. Stomach/Bowel: --Stomach/Duodenum: No hiatal hernia or other gastric abnormality. Normal duodenal course and caliber. --Small bowel: Unremarkable. --Colon: There is mild rectal wall thickening which is somewhat similar to prior study. --Appendix: Normal. Vascular/Lymphatic: Normal course and caliber of the major abdominal vessels. --No retroperitoneal lymphadenopathy. --No mesenteric lymphadenopathy. --No pelvic or inguinal lymphadenopathy. Reproductive: The Foley catheter bulb is inflated were proximal urethra. There are adjacent pockets of gas and free fluid (axial series 5, image 213). 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 Other: No ascites or free air. There is a fat and fluid containing right inguinal hernia. Musculoskeletal. No acute displaced fractures. Review of the MIP images confirms the above findings. IMPRESSION: 1. Motion degraded study. 2. Malpositioned Foley catheter bulb, currently CT weighted in the membranous part of the urethra. There are adjacent pockets of gas and free fluid concerning for a traumatic urethral injury. Repositioning is recommended. Urology consultation should be considered. 3. No evidence for an aortic dissection.  No acute abnormality. 4. Chronic findings as  detailed above, not substantially changed from recent prior studies. These results will be called to the ordering clinician or representative by the Radiologist Assistant, and communication documented in the PACS or [employer]. Electronically Signed   By: Katherine Mantle M.D.   On: 12/29/2020 19:05    Procedures .Central Line  Date/Time: 12/29/2020 7:56 PM Performed by: Pricilla Loveless, MD Authorized by: Pricilla Loveless, MD   Consent:    Consent obtained:  Verbal   Consent given by:  Patient Universal protocol:    Patient identity confirmed:  Verbally with patient Pre-procedure details:    Indication(s): central venous access and insufficient peripheral access     Hand hygiene: Hand hygiene performed prior to insertion     Sterile barrier technique: All elements of maximal sterile technique followed     Skin preparation:  Chlorhexidine   Skin preparation agent: Skin preparation agent completely dried prior to procedure   Sedation:    Sedation type:  None Anesthesia:    Anesthesia method:  Local infiltration   Local anesthetic:  Lidocaine 1% w/o epi Procedure details:    Location:  R internal jugular   Patient position:  Trendelenburg   Procedural supplies:  Triple lumen   Catheter size:  7 Fr   Landmarks identified: yes     Ultrasound guidance: yes     Ultrasound guidance timing: real time     Sterile ultrasound techniques: Sterile gel and sterile probe covers were used     Number of attempts:  1   Successful placement: yes   Post-procedure details:    Post-procedure:  Dressing applied and line sutured   Assessment:  Blood return through all ports, free fluid flow, placement verified by x-ray and no pneumothorax on x-ray   Procedure completion:  Tolerated well, no immediate complications  .Critical Care Performed by: Criss Alvine,  Lorin Picket, MD Authorized by: Pricilla Loveless, MD   Critical care provider statement:    Critical care time (minutes):  90   Critical care  time was exclusive of:  Separately billable procedures and treating other patients   Critical care was necessary to treat or prevent imminent or life-threatening deterioration of the following conditions:  Shock and circulatory failure   Critical care was time spent personally by me on the following activities:  Discussions with consultants, evaluation of patient's response to treatment, examination of patient, ordering and performing treatments and interventions, ordering and review of laboratory studies, ordering and review of radiographic studies, pulse oximetry, re-evaluation of patient's condition, obtaining history from patient or surrogate and review of old charts Ultrasound ED Peripheral IV (Provider)  Date/Time: 12/30/2020 8:49 AM Performed by: Pricilla Loveless, MD Authorized by: Pricilla Loveless, MD   Procedure details:    Indications: poor IV access     Skin Prep: isopropyl alcohol     Location:  Left AC   Angiocath:  20 G   Bedside Ultrasound Guided: Yes     Patient tolerated procedure without complications: Yes     Dressing applied: Yes       Medications Ordered in ED Medications  sorbitol, milk of mag, mineral oil, glycerin (SMOG) enema (960 mLs Rectal Not Given 12/29/20 1826)  lactated ringers infusion (has no administration in time range)  vancomycin (VANCOCIN) IVPB 1000 mg/200 mL premix (1,000 mg Intravenous New Bag/Given 12/29/20 1912)  norepinephrine (LEVOPHED) 4mg  in premix infusion (40 mcg/min Intravenous Rate/Dose Change 12/29/20 1919)  etomidate (AMIDATE) injection 20 mg (has no administration in time range)  rocuronium (ZEMURON) injection 100 mg (has no administration in time range)  lactated ringers bolus 1,000 mL (has no administration in time range)  lactated ringers bolus 1,000 mL (has no administration in time range)  0.9 %  sodium chloride infusion (has no administration in time range)  sodium chloride 0.9 % bolus 1,000 mL (0 mLs Intravenous Stopped 12/29/20  1825)  sodium chloride 0.9 % bolus 1,000 mL (0 mLs Intravenous Stopped 12/29/20 1937)    And  sodium chloride 0.9 % bolus 1,000 mL (1,000 mLs Intravenous New Bag/Given 12/29/20 1848)  ceFEPIme (MAXIPIME) 2 g in sodium chloride 0.9 % 100 mL IVPB (0 g Intravenous Stopped 12/29/20 1807)  metroNIDAZOLE (FLAGYL) IVPB 500 mg (0 mg Intravenous Stopped 12/29/20 1839)  vancomycin (VANCOCIN) IVPB 1000 mg/200 mL premix (1,000 mg Intravenous New Bag/Given 12/29/20 1912)  acetaminophen (OFIRMEV) IV 1,000 mg (0 mg Intravenous Stopped 12/29/20 1815)  iohexol (OMNIPAQUE) 350 MG/ML injection 100 mL (100 mLs Intravenous Contrast Given 12/29/20 1807)  LORazepam (ATIVAN) injection 2 mg (2 mg Intravenous Given 12/29/20 1919)    ED Course  I have reviewed the triage vital signs and the nursing notes.  Pertinent labs & imaging results that were available during my care of the patient were reviewed by me and considered in my medical decision making (see chart for details).  Clinical Course as of 12/29/20 1956  Sun Dec 29, 2020  1650 Patient started shivering and then complained of feeling short of breath. O2 was ok. Found to drop his BP into the 60s, and HR into the 120s. Abdomen feels better to him and he's had multiple bowel movements (prior to enema). Does not have outright fever but I'm concerned about sepsis. IV placed via U/S and will give fluids, antibiotics and look for source (probably urine with his hematuria). [SG]    Clinical Course User Index [  SG] Pricilla Loveless, MD   MDM Rules/Calculators/A&P                          Patient originally came in for hematuria after foley placement. It seems that NH placed foley and nothing came out. Then when they removed there was a lot of blood.  However, shortly after arriving, he started to feel worse and his VS went from normal to tachycardic, spiking a fever and becoming hypotensive. Started on sepsis protocol. Foleys placed by nursing staff here but would only get return  sometimes and then clog. Unfortunately on CT it looks like foley never made it fully into bladder.   After CT, patient seemed worse, was started on levophed. Central line placed for overall poor access. Had already been started on fluids/antibiotics. Has had increasing SOB, possibly from fluids vs PNA as source of infection. He also is having numerous bowel movements, likely relieving his previous constipation, though it is quite soft. At one point he had a seizure at the time his BP had dropped into the 60s. Stopped with ativan and then after a few minutes he fully recovered. I had considered airway intervention, but after setting up he fully recovered and is protecting his airway. He is only on [place] for O2 and doesn't appear to need emergent intervention. It seems that the seizure was due to hypotension.  His hemoglobin has dropped 3+ points since arrival, possibly dilution vs bleeding, and so he was transfused blood. I did discuss with Dr. Liliane Shi of urology who will see when at [facility] in ICU. Dr. Denese Killings has accepted to ICU. At one point his levophed requirement had increased, despite blood, fluids, so hydrocortisone and vasopressin given. Fortunately just prior to transfer his pressor requirements seem to be decreasing. Final Clinical Impression(s) / ED Diagnoses Final diagnoses:  Septic shock (HCC)  Gross hematuria  Acute kidney injury [facility])    Rx / DC Orders ED Discharge Orders    None       Pricilla Loveless, MD 12/30/20 [phone]

## 2020-12-29 NOTE — ED Notes (Signed)
Per v/o from Dr. Criss Alvine foley irrigation attempted; and set up for cont bladder irrigation

## 2020-12-29 NOTE — ED Notes (Signed)
Seizure lasted approx 30 seconds.

## 2020-12-29 NOTE — ED Notes (Signed)
Dr. Criss Alvine at bedside for [facility]

## 2020-12-29 NOTE — ED Notes (Signed)
EDP aware of t/c/updated report from [place] nurse

## 2020-12-29 NOTE — ED Notes (Signed)
Date and time results received: 12/29/20 1930  Test: lactic Critical Value: 5.6  Name of Provider Notified: Criss Alvine, MD  Orders Received? Or Actions Taken?: acknowledged

## 2020-12-29 NOTE — ED Notes (Signed)
First foley catheter removed at 1746; second foley catheter placed at 1749 and removed at 1828; removal time for catheter 2 is charted against catheter 1 in error

## 2020-12-29 NOTE — ED Notes (Signed)
[facility] arrived to transport Pt to [facility]

## 2020-12-29 NOTE — ED Notes (Signed)
Critical care called to Dr Criss Alvine

## 2020-12-29 NOTE — Progress Notes (Signed)
A consult was received from an ED physician for Vancomycin and cefepime per pharmacy dosing.  The patient's profile has been reviewed for ht/wt/allergies/indication/available labs.   A one time order has been placed for Cefepime 2gm IV x1 and Vancomycin total 2gm IV.  Further antibiotics/pharmacy consults should be ordered by admitting physician if indicated.                       Thank you, Elder Cyphers, BS Loura Back, BCPS Clinical Pharmacist Pager [phone] 12/29/2020  8:29 PM

## 2020-12-29 NOTE — ED Triage Notes (Signed)
Pt here by [facility] from [facility]; report significant bleeding when trying to change pt's foley catheter; bleeding controlled at this time; v/s stable

## 2020-12-29 NOTE — ED Notes (Signed)
EDP made aware of Pts V/S reading following post-15 minute blood transfusion. EDP agrees to proceed with blood transfusion at this time and monitor Pt for any status changes.

## 2020-12-29 NOTE — ED Notes (Signed)
MD at bedside to insert central line.

## 2020-12-29 NOTE — ED Notes (Signed)
Pt speaking to EDP at this time.

## 2020-12-29 NOTE — ED Notes (Signed)
Per v/o from EDP, [place], remove foley catheter and attempt new placement

## 2020-12-29 NOTE — ED Notes (Signed)
Call to [facility], spoke with nurse Alcario Drought; who reports pt has had no fever over weekend; reports last catheter placed was 11/28/2020 and it was a 55fr with a 30cc balloon; order was rcvd today to replace with a 34fr with a 30cc balloon; Nurse reports they removed the original catheter with no difficulty; inserted new catheter with no difficulty but after 60-74mins there was no urine return; staff attempted to irrigate catheter but was unsuccessful; balloon was deflated-while moving catheter pt began having perfuse bleeding

## 2020-12-29 NOTE — ED Notes (Signed)
Cont bladder irrigation not running, cont to attempt manual irrigation per v/o from Dr. Criss Alvine

## 2020-12-29 NOTE — Procedures (Addendum)
Arterial Catheter Insertion Procedure Note  MARQUIN PATINO  [id number]  August 17, 1971  Date:12/29/20  Time:8:18 PM    Provider Performing: Loyal Jacobson [place]    Procedure: Insertion of Arterial Line (13086) without US guidance  Indication(s) Blood pressure monitoring and/or need for frequent ABGs  Consent Unable to obtain consent due to emergent nature of procedure.  Anesthesia None   Time Out Verified patient identification, verified procedure, site/side was marked, verified correct patient position, special equipment/implants available, medications/allergies/relevant history reviewed, required imaging and test results available.   Sterile Technique Maximal sterile technique including full sterile barrier drape, hand hygiene, sterile gown, sterile gloves, mask, hair covering, sterile ultrasound probe cover (if used).   Procedure Description Area of catheter insertion was cleaned with chlorhexidine and draped in sterile fashion. Without real-time ultrasound guidance an arterial catheter was placed into the left radial artery.  Appropriate arterial tracings confirmed on monitor.     Complications/Tolerance None; patient tolerated the procedure well.   EBL Minimal   Specimen(s) None   Aline placed per MD order without complications. Initial BP of 129/56 with good blood flow and waveform on monitor.

## 2020-12-29 NOTE — Progress Notes (Signed)
Patient has arrived to room 1231 from [facility].

## 2020-12-30 ENCOUNTER — Encounter (HOSPITAL_COMMUNITY): Payer: Self-pay | Admitting: Pulmonary Disease

## 2020-12-30 DIAGNOSIS — R6521 Severe sepsis with septic shock: Secondary | ICD-10-CM

## 2020-12-30 DIAGNOSIS — R319 Hematuria, unspecified: Secondary | ICD-10-CM | POA: Diagnosis not present

## 2020-12-30 DIAGNOSIS — A419 Sepsis, unspecified organism: Secondary | ICD-10-CM

## 2020-12-30 LAB — BLOOD CULTURE ID PANEL (REFLEXED) - BCID2
A.calcoaceticus-baumannii: NOT DETECTED
Bacteroides fragilis: NOT DETECTED
CTX-M ESBL: NOT DETECTED
Candida albicans: NOT DETECTED
Candida auris: NOT DETECTED
Candida glabrata: NOT DETECTED
Candida krusei: NOT DETECTED
Candida parapsilosis: NOT DETECTED
Candida tropicalis: NOT DETECTED
Carbapenem resist OXA 48 LIKE: NOT DETECTED
Carbapenem resistance IMP: NOT DETECTED
Carbapenem resistance KPC: NOT DETECTED
Carbapenem resistance NDM: NOT DETECTED
Carbapenem resistance VIM: NOT DETECTED
Cryptococcus neoformans/gattii: NOT DETECTED
Enterobacter cloacae complex: NOT DETECTED
Enterobacterales: DETECTED — AB
Enterococcus Faecium: NOT DETECTED
Enterococcus faecalis: DETECTED — AB
Escherichia coli: DETECTED — AB
Haemophilus influenzae: NOT DETECTED
Klebsiella aerogenes: NOT DETECTED
Klebsiella oxytoca: NOT DETECTED
Klebsiella pneumoniae: NOT DETECTED
Listeria monocytogenes: NOT DETECTED
Neisseria meningitidis: NOT DETECTED
Proteus species: NOT DETECTED
Pseudomonas aeruginosa: NOT DETECTED
Salmonella species: NOT DETECTED
Serratia marcescens: NOT DETECTED
Staphylococcus aureus (BCID): NOT DETECTED
Staphylococcus epidermidis: NOT DETECTED
Staphylococcus lugdunensis: NOT DETECTED
Staphylococcus species: NOT DETECTED
Stenotrophomonas maltophilia: NOT DETECTED
Streptococcus agalactiae: NOT DETECTED
Streptococcus pneumoniae: NOT DETECTED
Streptococcus pyogenes: NOT DETECTED
Streptococcus species: NOT DETECTED
Vancomycin resistance: NOT DETECTED

## 2020-12-30 LAB — GLUCOSE, CAPILLARY
Glucose-Capillary: 130 mg/dL — ABNORMAL HIGH (ref 70–99)
Glucose-Capillary: 139 mg/dL — ABNORMAL HIGH (ref 70–99)
Glucose-Capillary: 154 mg/dL — ABNORMAL HIGH (ref 70–99)
Glucose-Capillary: 181 mg/dL — ABNORMAL HIGH (ref 70–99)
Glucose-Capillary: 83 mg/dL (ref 70–99)

## 2020-12-30 LAB — BPAM RBC
Blood Product Expiration Date: 202204272359
Blood Product Expiration Date: 202204272359
ISSUE DATE / TIME: 202204032020
ISSUE DATE / TIME: 202204032020
Unit Type and Rh: 6200
Unit Type and Rh: 6200

## 2020-12-30 LAB — CBC
HCT: 32.3 % — ABNORMAL LOW (ref 39.0–52.0)
Hemoglobin: 10.9 g/dL — ABNORMAL LOW (ref 13.0–17.0)
MCH: 27.9 pg (ref 26.0–34.0)
MCHC: 33.7 g/dL (ref 30.0–36.0)
MCV: 82.8 fL (ref 80.0–100.0)
Platelets: 161 10*3/uL (ref 150–400)
RBC: 3.9 MIL/uL — ABNORMAL LOW (ref 4.22–5.81)
RDW: 16.7 % — ABNORMAL HIGH (ref 11.5–15.5)
WBC: 38.3 10*3/uL — ABNORMAL HIGH (ref 4.0–10.5)
nRBC: 0 % (ref 0.0–0.2)

## 2020-12-30 LAB — TYPE AND SCREEN
ABO/RH(D): A POS
ABO/RH(D): A POS
Antibody Screen: NEGATIVE
Antibody Screen: NEGATIVE
Unit division: 0
Unit division: 0

## 2020-12-30 LAB — BASIC METABOLIC PANEL
Anion gap: 11 (ref 5–15)
BUN: 17 mg/dL (ref 6–20)
CO2: 19 mmol/L — ABNORMAL LOW (ref 22–32)
Calcium: 7.5 mg/dL — ABNORMAL LOW (ref 8.9–10.3)
Chloride: 104 mmol/L (ref 98–111)
Creatinine, Ser: 1.86 mg/dL — ABNORMAL HIGH (ref 0.61–1.24)
GFR, Estimated: 44 mL/min — ABNORMAL LOW (ref >60)
Glucose, Bld: 125 mg/dL — ABNORMAL HIGH (ref 70–99)
Potassium: 3.9 mmol/L (ref 3.5–5.1)
Sodium: 134 mmol/L — ABNORMAL LOW (ref 135–145)

## 2020-12-30 LAB — HEMOGLOBIN A1C
Hgb A1c MFr Bld: 6.3 % — ABNORMAL HIGH (ref 4.8–5.6)
Mean Plasma Glucose: 134.11 mg/dL

## 2020-12-30 LAB — PROCALCITONIN: Procalcitonin: >150 ng/mL

## 2020-12-30 LAB — MRSA PCR SCREENING: MRSA by PCR: NEGATIVE

## 2020-12-30 LAB — LACTIC ACID, PLASMA
Lactic Acid, Venous: 4.7 mmol/L (ref 0.5–1.9)
Lactic Acid, Venous: 5.3 mmol/L (ref 0.5–1.9)

## 2020-12-30 MED ORDER — SODIUM CHLORIDE 0.9 % IV SOLN
250.0000 mg | Freq: Two times a day (BID) | INTRAVENOUS | Status: DC
  Administered 2020-12-30: 250 mg via INTRAVENOUS
  Filled 2020-12-30: qty 2.5

## 2020-12-30 MED ORDER — LEVETIRACETAM 250 MG PO TABS
250.0000 mg | ORAL_TABLET | Freq: Two times a day (BID) | ORAL | Status: DC
  Administered 2020-12-30: 250 mg via ORAL
  Filled 2020-12-30 (×3): qty 1

## 2020-12-30 MED ORDER — INSULIN ASPART 100 UNIT/ML ~~LOC~~ SOLN
0.0000 [IU] | SUBCUTANEOUS | Status: DC
  Administered 2020-12-30 (×2): 3 [IU] via SUBCUTANEOUS
  Administered 2020-12-30 – 2021-01-01 (×6): 2 [IU] via SUBCUTANEOUS
  Administered 2021-01-01: 3 [IU] via SUBCUTANEOUS
  Administered 2021-01-01 – 2021-01-04 (×2): 2 [IU] via SUBCUTANEOUS

## 2020-12-30 MED ORDER — ACETAMINOPHEN 325 MG PO TABS
650.0000 mg | ORAL_TABLET | ORAL | Status: DC | PRN
  Administered 2020-12-30 – 2021-01-05 (×4): 650 mg via ORAL
  Filled 2020-12-30 (×4): qty 2

## 2020-12-30 MED ORDER — LORAZEPAM 1 MG PO TABS
1.0000 mg | ORAL_TABLET | Freq: Two times a day (BID) | ORAL | Status: DC | PRN
  Administered 2020-12-30: 1 mg via ORAL
  Filled 2020-12-30: qty 1

## 2020-12-30 MED ORDER — ONDANSETRON HCL 4 MG/2ML IJ SOLN: 4.0000 mg | Freq: Four times a day (QID) | INTRAMUSCULAR | Status: DC | PRN

## 2020-12-30 MED ORDER — NOREPINEPHRINE 16 MG/250ML-% IV SOLN
0.0000 ug/min | INTRAVENOUS | Status: DC
  Administered 2020-12-30: 22 ug/min via INTRAVENOUS
  Filled 2020-12-30 (×3): qty 250

## 2020-12-30 MED ORDER — BACLOFEN 10 MG PO TABS
10.0000 mg | ORAL_TABLET | Freq: Three times a day (TID) | ORAL | Status: DC
  Administered 2020-12-30 – 2021-01-06 (×24): 10 mg via ORAL
  Filled 2020-12-30 (×24): qty 1

## 2020-12-30 MED ORDER — HYDROCORTISONE NA SUCCINATE PF 100 MG IJ SOLR
50.0000 mg | Freq: Four times a day (QID) | INTRAMUSCULAR | Status: AC
  Administered 2020-12-30 – 2021-01-02 (×16): 50 mg via INTRAVENOUS
  Filled 2020-12-30 (×16): qty 2

## 2020-12-30 MED ORDER — LACTATED RINGERS IV BOLUS
1000.0000 mL | Freq: Once | INTRAVENOUS | Status: AC
  Administered 2020-12-30: 1000 mL via INTRAVENOUS

## 2020-12-30 MED ORDER — PRAVASTATIN SODIUM 20 MG PO TABS
20.0000 mg | ORAL_TABLET | Freq: Every day | ORAL | Status: DC
  Administered 2020-12-30 – 2021-01-06 (×8): 20 mg via ORAL
  Filled 2020-12-30 (×8): qty 1

## 2020-12-30 MED ORDER — SODIUM CHLORIDE 0.9 % IV SOLN
2.0000 g | Freq: Three times a day (TID) | INTRAVENOUS | Status: DC
  Administered 2020-12-30 – 2021-01-01 (×7): 2 g via INTRAVENOUS
  Filled 2020-12-30 (×7): qty 2

## 2020-12-30 MED ORDER — LINACLOTIDE 145 MCG PO CAPS
145.0000 ug | ORAL_CAPSULE | Freq: Every day | ORAL | Status: DC
  Administered 2020-12-30 – 2021-01-06 (×8): 145 ug via ORAL
  Filled 2020-12-30 (×9): qty 1

## 2020-12-30 MED ORDER — LACTATED RINGERS IV BOLUS: 1000.0000 mL | Freq: Once | INTRAVENOUS | Status: DC

## 2020-12-30 MED ORDER — PREGABALIN 50 MG PO CAPS
100.0000 mg | ORAL_CAPSULE | Freq: Two times a day (BID) | ORAL | Status: DC
  Administered 2020-12-30 – 2021-01-06 (×16): 100 mg via ORAL
  Filled 2020-12-30: qty 1
  Filled 2020-12-30: qty 2
  Filled 2020-12-30 (×9): qty 1
  Filled 2020-12-30: qty 2
  Filled 2020-12-30 (×2): qty 1
  Filled 2020-12-30: qty 2
  Filled 2020-12-30: qty 1

## 2020-12-30 MED ORDER — PANTOPRAZOLE SODIUM 40 MG PO TBEC
40.0000 mg | DELAYED_RELEASE_TABLET | Freq: Every day | ORAL | Status: DC
  Administered 2020-12-30 – 2021-01-06 (×8): 40 mg via ORAL
  Filled 2020-12-30 (×8): qty 1

## 2020-12-30 MED ORDER — PANTOPRAZOLE SODIUM 40 MG IV SOLR: 40.0000 mg | Freq: Every day | INTRAVENOUS | Status: DC

## 2020-12-30 MED ORDER — DOCUSATE SODIUM 100 MG PO CAPS: 100.0000 mg | ORAL_CAPSULE | Freq: Two times a day (BID) | ORAL | Status: DC | PRN

## 2020-12-30 MED ORDER — CHLORHEXIDINE GLUCONATE CLOTH 2 % EX PADS
6.0000 | MEDICATED_PAD | Freq: Every day | CUTANEOUS | Status: DC
  Administered 2020-12-30 – 2021-01-06 (×9): 6 via TOPICAL

## 2020-12-30 MED ORDER — VANCOMYCIN HCL 2000 MG/400ML IV SOLN: 2000.0000 mg | INTRAVENOUS | Status: DC

## 2020-12-30 MED ORDER — OXYBUTYNIN CHLORIDE ER 5 MG PO TB24
10.0000 mg | ORAL_TABLET | Freq: Every day | ORAL | Status: DC
  Administered 2020-12-30 – 2021-01-06 (×8): 10 mg via ORAL
  Filled 2020-12-30 (×8): qty 2

## 2020-12-30 MED ORDER — POLYETHYLENE GLYCOL 3350 17 G PO PACK: 17.0000 g | PACK | Freq: Every day | ORAL | Status: DC | PRN

## 2020-12-30 MED ORDER — LACTATED RINGERS IV SOLN: INTRAVENOUS | Status: DC

## 2020-12-30 MED ORDER — DEXTROSE-NACL 5-0.45 % IV SOLN: INTRAVENOUS | Status: DC

## 2020-12-30 NOTE — Progress Notes (Signed)
eLink Physician-Brief Progress Note Patient Name: Vincent Anthony DOB: 03-26-1971 MRN: [id number]   Date of Service  12/30/2020  HPI/Events of Note  9M with hx of MS and chronic indwelling Foley catheter who p/w penile bleeding that started after a routine catheter change at his facility. On presentation, he was found to be febrile to 102.6 F and hypotensive to as low as 79/45 (MAP 57).  Labs showed an AKI (Cr 1.5 vs baseline of 0.9), elevated lactate (5.7->5.6->6.0) and Hgb 8.4 (down from a baseline of ~ 12). UA with rare bacteria, trace leukocyte esterase.  Historical urine culture from 08/06/2020 shows E.coli sensitive to CTX.  Urology has already replaced the malpositioned foley catheter (see CT Abd/[facility] report).  Patient remains hypotensive and requiring levophed at 26 mcg/min.   eICU Interventions  # Cardiac: - Hypotension secondary to combined septic shock + hemorrhagic shock. S/p 2u pRBCs, IVF resuscitation and administration of antibiotics. - Repeat CBC to assess H/H trajectory. New T&S. - Levophed + vasopressin drips as needed to maintain MAP 65. 1L fluid bolus now.  # Respiratory: - NAI at this time.  # GU: - S/p foley replacement over wire by Urology. Monitor quality of foley output for any signs of developing clots which would require manual irrigation.  # ID: - Empiric vanc/cefepime for septic shock. - F/u pancultures.  # Renal: - AKI + lactic acidosis: in setting of mixed septic/? Hemorrhagic shock. Management as above.  DVT PPX: SCDs only GI PPX: Not indicated     Intervention Category Evaluation Type: New Patient Evaluation  Janae Bridgeman 12/30/2020, 12:29 AM

## 2020-12-30 NOTE — Progress Notes (Addendum)
PHARMACY - PHYSICIAN COMMUNICATION CRITICAL VALUE ALERT - BLOOD CULTURE IDENTIFICATION (BCID)  Vincent Anthony is an 50 y.o. male who presented to [facility] on 12/29/2020 with a chief complaint of shock, on pressors.    Assessment:   Currently on cefepime 2 g IV every 8 hours and vancomycin 2000 mg IV every 24 hours for empiric treatment.  Source of infection possibly from foley catheter, WBC 38.3, Tmax 101.8, LA 5.3.    Name of physician (or Provider) Contacted: Ogan  Current antibiotics: Cefepime 2 g IV every 8 hours and vancomycin 2000 mg IV every 24 hours  Changes to prescribed antibiotics recommended:  Patient is on an empiric antibiotic regimen that will cover detected organisms - No changes needed at this time.    Results for orders placed or performed during the hospital encounter of 12/29/20  Blood Culture ID Panel (Reflexed) (Collected: 12/29/2020  4:35 PM)  Result Value Ref Range   Enterococcus faecalis DETECTED (A) NOT DETECTED   Enterococcus Faecium NOT DETECTED NOT DETECTED   Listeria monocytogenes NOT DETECTED NOT DETECTED   Staphylococcus species NOT DETECTED NOT DETECTED   Staphylococcus aureus (BCID) NOT DETECTED NOT DETECTED   Staphylococcus epidermidis NOT DETECTED NOT DETECTED   Staphylococcus lugdunensis NOT DETECTED NOT DETECTED   Streptococcus species NOT DETECTED NOT DETECTED   Streptococcus agalactiae NOT DETECTED NOT DETECTED   Streptococcus pneumoniae NOT DETECTED NOT DETECTED   Streptococcus pyogenes NOT DETECTED NOT DETECTED   A.calcoaceticus-baumannii NOT DETECTED NOT DETECTED   Bacteroides fragilis NOT DETECTED NOT DETECTED   Enterobacterales DETECTED (A) NOT DETECTED   Enterobacter cloacae complex NOT DETECTED NOT DETECTED   Escherichia coli DETECTED (A) NOT DETECTED   Klebsiella aerogenes NOT DETECTED NOT DETECTED   Klebsiella oxytoca NOT DETECTED NOT DETECTED   Klebsiella pneumoniae NOT DETECTED NOT DETECTED   Proteus species NOT DETECTED NOT  DETECTED   Salmonella species NOT DETECTED NOT DETECTED   Serratia marcescens NOT DETECTED NOT DETECTED   Haemophilus influenzae NOT DETECTED NOT DETECTED   Neisseria meningitidis NOT DETECTED NOT DETECTED   Pseudomonas aeruginosa NOT DETECTED NOT DETECTED   Stenotrophomonas maltophilia NOT DETECTED NOT DETECTED   Candida albicans NOT DETECTED NOT DETECTED   Candida auris NOT DETECTED NOT DETECTED   Candida glabrata NOT DETECTED NOT DETECTED   Candida krusei NOT DETECTED NOT DETECTED   Candida parapsilosis NOT DETECTED NOT DETECTED   Candida tropicalis NOT DETECTED NOT DETECTED   Cryptococcus neoformans/gattii NOT DETECTED NOT DETECTED   CTX-M ESBL NOT DETECTED NOT DETECTED   Carbapenem resistance IMP NOT DETECTED NOT DETECTED   Carbapenem resistance KPC NOT DETECTED NOT DETECTED   Carbapenem resistance NDM NOT DETECTED NOT DETECTED   Carbapenem resist OXA 48 LIKE NOT DETECTED NOT DETECTED   Vancomycin resistance NOT DETECTED NOT DETECTED   Carbapenem resistance VIM NOT DETECTED NOT DETECTED    Royce Macadamia, PharmD, BCPS 12/30/2020  9:22 PM

## 2020-12-30 NOTE — Progress Notes (Addendum)
eLink Physician-Brief Progress Note Patient Name: Vincent Anthony DOB: 07-29-1971 MRN: [id number]   Date of Service  12/30/2020  HPI/Events of Note  Pharmacist asking whether to de-escalate antibiotic coverage to Rocephin + Ampicillin for possible Enterococcus in the blood.  eICU Interventions  Vancomycin + Cefepime to be continued overnight pending additional culture information. Tylenol 650 mg tabs ordered PRN headache.        Thomasene Lot Eugene Zeiders 12/30/2020, 9:24 PM

## 2020-12-30 NOTE — Progress Notes (Addendum)
The patient is receiving Protonix by the intravenous route.  Based on criteria approved by the Pharmacy and Therapeutics Committee and the Medical Executive Committee, the medication is being converted to the equivalent oral dose form.  These criteria include: -No active GI bleeding -Able to tolerate diet of full liquids (or better) or tube feeding -Able to tolerate other medications by the oral or enteral route  If you have any questions about this conversion, please contact the Pharmacy Department (phone [phone]).  Thank you.  Addendum: IV keppra 250 bid>> keppra 250 po bid  Herby Abraham, Pharm.D 12/30/2020 10:50 AM

## 2020-12-30 NOTE — H&P (Addendum)
NAME:  Vincent Anthony, MRN:  [id number], DOB:  16-Mar-1971, LOS: 1 ADMISSION DATE:  12/29/2020, CONSULTATION DATE:  12/30/20 REFERRING MD:  [facility], CHIEF COMPLAINT:    History of Present Illness:  50 yo man with MS, overactive bladder with foley x 1 month, with hematuria after traumatic foley.  [facility] from [place] [facility].   Catheter initially placed 11/28/20 New Catheter placed 12/29/20, no UOP, developed profuse bleeding upon removal.  Tried CBI, but unable to flush, no fluid entering. .   Attempted to place another catheter.    n the ED, Developed shivering, fever dyspnea, hypotension, tachycardia.    Episode of brief loss of consciousness in ED (seizure?)  Started cefepime and vancomycin.  Blood transfusion.   Foley placed by urology upon arrival to [facility] hospital.   Per urology: "it seems that the balloon of the new catheter was inflated in the urethra. The foley subsequently failed to drain, and the patient developed bleeding around the foley catheter, for which he presented to the ED. " R sided abdominal pain    Has been started on vanc, cefepime and flagyl.  Tylenol given at 2200.  Titrating down on levophed.   +5L  Given hydrocortisone, ativan Pertinent  Medical History   Arthritis Transaminitis GERD MS Tremor?  Seizures?  Neurogenic bladder  Home meds:  amitriptyline 10mg  qhs Baclofen 10mg  TID  cetirizine 10mg  daily  vitamin D 2000 u qam Klonopin 1mg  BID  hyoscyamine0.375 q 12 hrs  keppra 250 BID   amitiza BID  metformin500 mg BID  Ditropan 10mg  daily   pravachol 20mg  qam   prednisone 40mg  daily  pregabalin 150 mg q hs, 75 qam senna Flexeril 5mg  TID   flonase 2 sprays daily   linzess daily  ocrelizumab q6 months vesicare10mg  daily  bactrim (12/09/20) Significant Hospital Events: Including procedures, antibiotic start and stop dates in addition to other pertinent events     Interim History / Subjective:     Objective   Blood pressure 122/76, pulse (!) 116, temperature 99.2 F (37.3 C), temperature source Oral, resp. rate (!) 21, weight 99.8 kg, SpO2 98 %.        Intake/Output Summary (Last 24 hours) at 12/30/2020 0040 Last data filed at 12/29/2020 2223 Gross per 24 hour  Intake 5251.82 ml  Output --  Net 5251.82 ml   Filed Weights   12/29/20 1827  Weight: 99.8 kg    Examination: General: nad, pleasant   HENT: NCAT Lungs: CTAB Cardiovascular: RRR no mgr Abdomen: nt, nd, nbs Extremities: no edema, no erythema Neuro: awake and alert, easily arousable GU: Foley in place, blood tinged urine, no clots, +urine output  Labs/imaging that I havepersonally reviewed  (right click and "Reselect all SmartList Selections" daily)  CBC, CMP CT abd pelvis  IMPRESSION: 1. Motion degraded study. 2. Malpositioned Foley catheter bulb, currently CT weighted in the membranous part of the urethra. There are adjacent pockets of gas and free fluid concerning for a traumatic urethral injury. Repositioning is recommended. Urology consultation should be considered. 3. No evidence for an aortic dissection.  No acute abnormality. 4. Chronic findings as detailed above, not substantially changed from recent prior studies.  CXR IMPRESSION: 1. The right central line terminates in the central SVC without pneumothorax. 2. New oval opacity in the lateral right lung base could represent atelectasis or developing infiltrate. Recommend attention on short-term follow-up. 3. Persistent opacity in the left base is better  seen earlier today. Earlier today, this opacity appeared to represent atelectasis.  Resolved Hospital Problem list     Assessment & Plan:  Hematuria, foley catheter related.   Foley successfully placed by urologist this evening.  Cont to monitor output. Prn flushing of foley per urology recs.  Cont IV fluids.    Shock, possibly sepsis: started on broad spectrum antibiotics.  5L  positive fluids.  On levophed 11 mcg now.   Urinary retention:  Ditropan   AKI Lactic acidosis Anemia  Chronic steroid use? Prednisone listed in medication list. Started on 12/21/20.  Was given one dose hydrocortisone in ED.    Covid positive 11/17/20  Cont home meds: baclofen, keppra,  linzess, pravachol, lyrica,   DM: ISS held metformin  Best practice (right click and "Reselect all SmartList Selections" daily)  Diet:  Oral Pain/Anxiety/Delirium protocol (if indicated): No VAP protocol (if indicated): Not indicated DVT prophylaxis: SCDs, no heparin given bleeding GI prophylaxis: PPI Glucose control:  SSI Yes Central venous access:  Yes still needed Arterial line:  Yes, and it is still needed Foley:  Yes, and it is still needed Mobility:  bed rest  PT consulted: N/A Last date of multidisciplinary goals of care discussion []  Code Status:  full code Disposition: ICU  Labs   CBC: Recent Labs  Lab 12/29/20 1635 12/29/20 1857  WBC 4.0  --   NEUTROABS 3.8  --   HGB 11.9* 8.4*  HCT 37.8* 26.8*  MCV 87.5  --   PLT 250  --     Basic Metabolic Panel: Recent Labs  Lab 12/29/20 1635  NA 139  K 3.8  CL 101  CO2 23  GLUCOSE 132*  BUN 13  CREATININE 1.50*  CALCIUM 8.8*   GFR: Estimated Creatinine Clearance: 70.4 mL/min (A) (by C-G formula based on SCr of 1.5 mg/dL (H)). Recent Labs  Lab 12/29/20 1635 12/29/20 1905 12/29/20 2020  WBC 4.0  --   --   LATICACIDVEN 5.7* 5.6* 6.0*    Liver Function Tests: Recent Labs  Lab 12/29/20 1635  AST 25  ALT 38  ALKPHOS 96  BILITOT 0.6  PROT 6.0*  ALBUMIN 3.2*   No results for input(s): LIPASE, AMYLASE in the last 168 hours. No results for input(s): AMMONIA in the last 168 hours.  ABG    Component Value Date/Time   TCO2 26 12/04/2009 0444     Coagulation Profile: Recent Labs  Lab 12/29/20 1635  INR 1.0    Cardiac Enzymes: No results for input(s): CKTOTAL, CKMB, CKMBINDEX, TROPONINI in the last 168  hours.  HbA1C: No results found for: HGBA1C  CBG: Recent Labs  Lab 12/29/20 1921  GLUCAP 118*    Review of Systems:   Negative ROS  Past Medical History:  He,  has a past medical history of Arthritis, Elevated LFTs, Elevated liver enzymes, GERD (gastroesophageal reflux disease), Headache, MS (multiple sclerosis) (HCC), Neurogenic bladder, and Tremor.   Surgical History:   Past Surgical History:  Procedure Laterality Date  . COLONOSCOPY     2-4 years ago  . HERNIA REPAIR    . KNEE SURGERY    . TESTICLE REMOVAL       Social History:   reports that he has never smoked. He has never used smokeless tobacco. He reports that he does not drink alcohol and does not use drugs.   Family History:  His family history includes Diabetes in his father; Heart attack in his mother; Hypertension in his father. There  is no history of Colon cancer.   Allergies Allergies  Allergen Reactions  . Celebrex [Celecoxib] Nausea And Vomiting  . Vioxx [Rofecoxib] Nausea And Vomiting  . Zanaflex [Tizanidine Hcl] Nausea And Vomiting  . Clindamycin Diarrhea     Home Medications  Prior to Admission medications   Medication Sig Start Date End Date Taking? Authorizing Provider  amitriptyline (ELAVIL) 10 MG tablet Take 1 tablet by mouth at bedtime.  11/09/14  Yes [provider]  baclofen (LIORESAL) 10 MG tablet Take 1 tablet by mouth 3 (three) times daily. 11/09/14  Yes [provider]  cetirizine (ZYRTEC) 10 MG tablet Take 10 mg by mouth daily.   Yes [provider]  Cholecalciferol (VITAMIN D3) 50 MCG (2000 UT) TABS Take 2,000 Units by mouth in the morning.   Yes [provider]  clonazePAM (KLONOPIN) 1 MG tablet Take 1 tablet (1 mg total) by mouth 2 (two) times daily. Or as needed 10/31/20  Yes Terrilee Files, MD  hyoscyamine (LEVBID) 0.375 MG 12 hr tablet TAKE (1) TABLET BYMOUTH EVERY TWELVE HOURS. 09/16/20  Yes Gelene Mink, NP  levETIRAcetam (KEPPRA) 250 MG  tablet Take 1 tablet by mouth 2 (two) times daily. 11/09/14  Yes [provider]  lubiprostone (AMITIZA) 8 MCG capsule Take 8 mcg by mouth 2 (two) times daily with a meal.   Yes [provider]  metFORMIN (GLUCOPHAGE) 500 MG tablet Take 500 mg by mouth 2 (two) times daily.   Yes [provider]  oxybutynin (DITROPAN-XL) 10 MG 24 hr tablet Take 10 mg by mouth daily.   Yes [provider]  pravastatin (PRAVACHOL) 20 MG tablet Take 20 mg by mouth every morning.    Yes [provider]  predniSONE (DELTASONE) 20 MG tablet Take 40 mg by mouth daily. 12/21/20  Yes [provider]  pregabalin (LYRICA) 150 MG capsule Take 150 mg by mouth at bedtime. 12/23/20  Yes [provider]  pregabalin (LYRICA) 75 MG capsule Take 75 mg by mouth See admin instructions. 1 capsule in morning and 2 capsules at bedtime   Yes [provider]  senna (SENOKOT) 8.6 MG tablet Take 2 tablets by mouth 2 (two) times daily.   Yes [provider]  acetaminophen (TYLENOL) 325 MG tablet Take 650 mg by mouth every 6 (six) hours as needed. Patient not taking: Reported on 12/29/2020    [provider]  Coenzyme Q10 (CO Q-10) 100 MG CAPS Take 1 capsule by mouth daily. Patient not taking: Reported on 12/29/2020    [provider]  cyclobenzaprine (FLEXERIL) 5 MG tablet Take 5 mg by mouth 3 (three) times daily as needed. Patient not taking: Reported on 12/29/2020    [provider]  fluticasone (FLONASE) 50 MCG/ACT nasal spray Place 2 sprays into both nostrils daily as needed for allergies or rhinitis. Patient not taking: Reported on 12/29/2020    [provider]  GNP FIBER-CAPS 625 MG tablet Take 625 mg by mouth daily.  Patient not taking: Reported on 12/29/2020 05/03/18   [provider]  linaclotide Karlene Einstein) 145 MCG CAPS capsule Take 1 capsule (145 mcg total) by mouth daily before breakfast. 11/19/20   Erick Blinks, MD   Ocrelizumab (OCREVUS IV) Inject into the vein. Every 6 months for MS    [provider]  sodium phosphate (FLEET) 7-19 GM/118ML ENEM Place 1 enema rectally daily as needed for severe constipation. Patient not taking: Reported on 12/29/2020    [provider]  sulfamethoxazole-trimethoprim (BACTRIM DS) 800-160 MG tablet Take 1 tablet by mouth 2 (two) times daily. Patient not taking: Reported on 12/29/2020 12/09/20   [provider]  VESICARE 10 MG tablet Take 1 tablet by mouth every morning.  Patient not taking: Reported on 12/29/2020 11/09/14   [provider]     Critical care time: 45 minutes

## 2020-12-30 NOTE — TOC Initial Note (Signed)
Transition of Care [facility]) - Initial/Assessment Note    Patient Details  Name: Vincent Anthony MRN: [id number] Date of Birth: 26-Dec-1970  Transition of Care [facility]) CM/SW Contact:    Golda Acre, RN Phone Number: 12/30/2020, 8:52 AM  Clinical Narrative:                 50 yo man with MS, overactive bladder with foley x 1 month, with hematuria after traumatic foley.  [facility] from [place] [facility].   Catheter initially placed 11/28/20 New Catheter placed 12/29/20, no UOP, developed profuse bleeding upon removal.  Tried CBI, but unable to flush, no fluid entering. .   Attempted to place another catheter.    n the ED, Developed shivering, fever dyspnea, hypotension, tachycardia.    Episode of brief loss of consciousness in ED (seizure?)  Started cefepime and vancomycin.  Blood transfusion.   Foley placed by urology upon arrival to [facility] hospital.   Per urology: "it seems that the balloon of the new catheter was inflated in the urethra. The foley subsequently failed to drain, and the patient developed bleeding around the foley catheter, for which he presented to the ED. " R sided abdominal pain    Has been started on vanc, cefepime and flagyl.  Tylenol given at 2200.  Titrating down on levophed.  PLAN: to return to home with self care, previous level of adl's, following for toc needs.  Expected Discharge Plan: Home/Self Care Barriers to Discharge: Continued Medical Work up   Patient Goals and CMS Choice Patient states their goals for this hospitalization and ongoing recovery are:: to go home CMS Medicare.gov Compare Post Acute Care list provided to:: Patient    Expected Discharge Plan and Services Expected Discharge Plan: Home/Self Care   Discharge Planning Services: CM Consult   Living arrangements for the past 2 months: Single Family Home                                      Prior Living Arrangements/Services Living arrangements for the  past 2 months: Single Family Home Lives with:: Spouse Patient language and need for interpreter reviewed:: Yes Do you feel safe going back to the place where you live?: Yes      Need for Family Participation in Patient Care: Yes (Comment) Care giver support system in place?: Yes (comment)   Criminal Activity/Legal Involvement Pertinent to Current Situation/Hospitalization: No - Comment as needed  Activities of Daily Living      Permission Sought/Granted                  Emotional Assessment Appearance:: Appears stated age Attitude/Demeanor/Rapport: Engaged Affect (typically observed): Calm Orientation: : Oriented to Place,Oriented to Self,Oriented to  Time,Oriented to Situation Alcohol / Substance Use: Not Applicable Psych Involvement: No (comment)  Admission diagnosis:  Septic shock (HCC) [A41.9, R65.21] Patient Active Problem List   Diagnosis Date Noted  . Septic shock (HCC) 12/29/2020  . Seizures (HCC) 11/18/2020  . Hyponatremia 11/18/2020  . AKI (acute kidney injury) (HCC) 11/18/2020  . Acute respiratory failure with hypoxia (HCC) 11/18/2020  . Pneumonia due to COVID-19 virus 11/17/2020  . MS (multiple sclerosis) (HCC) 03/08/2018  . 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 History of urinary stone 02/03/2018  . Urge incontinence of urine 02/03/2018  . Abdominal pain 01/05/2017  . Elevated LFTs 11/05/2016  . Neuropathic pain 05/23/2015  .  Arthralgia of both knees 05/23/2015  . Urinary tract stones 01/03/2015  . Chronic pain syndrome 03/20/2014  . 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 Bilateral knee pain 01/29/2013  . 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 Ongoing use of possibly toxic medication 10/11/2012  . Acne 01/13/2012  . Seborrheic dermatitis 01/13/2012  . Neurogenic bladder 11/13/2011  . Urinary urgency 11/13/2011   PCP:  [name], MD Pharmacy:   [facility], [place] - [address] [facility] [address] [facility] [address] [place] [address] Phone: [phone] Fax: [phone]     Social Determinants of Health (SDOH) Interventions    Readmission Risk Interventions No flowsheet data found.

## 2020-12-30 NOTE — Progress Notes (Signed)
eLink Physician-Brief Progress Note Patient Name: Vincent Anthony DOB: 10-Sep-1971 MRN: [id number]   Date of Service  12/30/2020  HPI/Events of Note  Hypotension. Patient remains on 21 mcg/min of levophed + vasopressin. His Hgb came back ~10, so this is not hemorrhagic shock. Consistent with septic shock. Has received appropriate fluid resuscitation.   eICU Interventions  Will add hydrocortisone 50mg  IV Q6H for stress dose steroids. He already received a 1x dose of 100mg  at 2200 hrs on 4/3.     Intervention Category Major Interventions: Hypotension - evaluation and management  12/30/2020, 3:46 AM

## 2020-12-30 NOTE — Progress Notes (Signed)
Per request from Michaelene Song, RN, norepinephrine was changed to quad strength due to high rate of infusion (patient has central line). RN aware to adjust Alaris pump settings to reflect new concentration.   Greer Pickerel, PharmD, BCPS 12/30/2020 9:29 AM

## 2020-12-30 NOTE — Progress Notes (Signed)
Pharmacy Antibiotic Note  Vincent Anthony is a 50 y.o. male admitted on 12/29/2020 with urosepsis.  Pharmacy has been consulted for Vancomycin & Cefepime dosing.  Plan: Cefepime 2gm IV q8h Vancomycin 2gm IV q24h to target AUC 400-550 Check Vancomycin levels at steady state Monitor renal function and cx data   Height: (P) 6\' 3"  (190.5 cm) Weight: 99.8 kg (220 lb) IBW/kg (Calculated) : (P) 84.5  Temp (24hrs), Avg:100.6 F (38.1 C), Min:99.2 F (37.3 C), Max:102.6 F (39.2 C)  Recent Labs  Lab 12/29/20 1635 12/29/20 1905 12/29/20 2020  WBC 4.0  --   --   CREATININE 1.50*  --   --   LATICACIDVEN 5.7* 5.6* 6.0*    Estimated Creatinine Clearance: 70.4 mL/min (A) (by C-G formula based on SCr of 1.5 mg/dL (H)).    Allergies  Allergen Reactions  . Celebrex [Celecoxib] Nausea And Vomiting  . Vioxx [Rofecoxib] Nausea And Vomiting  . Zanaflex [Tizanidine Hcl] Nausea And Vomiting  . Clindamycin Diarrhea    Antimicrobials this admission: 4/3 Cefepime >>  4/3 Vancomycin >> 4/3 Flagyl x1  Dose adjustments this admission:  Microbiology results: 4/3 BCx:  4/3 UCx:   4/3 MRSA PCR: negative  Thank you for allowing pharmacy to be a part of this patient's care.  6/3 PharmD, BCPS 12/30/2020 2:01 AM

## 2020-12-30 NOTE — Procedures (Addendum)
Procedure: Complicated foley catheter placement CPT: 57103  Indication: Vincent Anthony is a 50yo male with progressive MS and resulting overactive bladder, managed by a foley catheter for the past month for symptom control, who was due for routine foley catheter exchange today at his rehab facility; however, it seems that the balloon of the new catheter was inflated in the urethra. The foley subsequently failed to drain, and the patient developed bleeding around the foley catheter, for which he presented to the ED. Not on blood thinners. He subsequently developed signs concerning for urosepsis in the ED. CT A/P without signs of bladder clot. He was transferred to [facility] ICU for intensive care and urologic consultation for difficult foley placement.   Of note, the patient is followed by Dr. Lenoria Chime at [facility], but he reports that it's been awhile since he's since him.  Procedure details: The patient was seen his hospital room. Physical exam showed normal circumcised phallus with blood around urethral meatus. The penis was prepped and draped in typical sterile fashion. I began by inserting a 16 Fr straight tipped foley catheter per urethra. I met some mild resistance at what seemed to be the prostatic urethra, but I was able to advance the catheter up to the hub with return of blood-tinged urine. I inflated the balloon with 10cc sterile water. When I pulled the foley outward, it moved further out than I would expect the bladder neck to be with subsequent increased bleeding around the urethral meatus. I believe this is because the defect in the prostatic urethra from prior traumatic foley placement is fairly large, and the 10cc balloon was pulling back into this defect. I deflated the balloon and attempted to advance the catheter back to the hub, but I continued to meet resistance at what appeared to be the prostatic urethra. Thus, I decided to remove the 16 Fr foley catheter.  I probed  the urethra with an angled glide wire and was able to find the natural lumen after several attempts. I advanced the wire into the bladder based on feel. I then advanced an 18 Fr council-tipped foley catheter over the glidewire up to the hub with return of blood-tinged urine. I inflated the balloon with 30cc sterile water (to prevent it from pulling back into the prostatic urethral defect) and removed the glidewire. I then irrigated the foley catheter with sterile water to confirm it's location in the bladder. I connected the foley catheter to a drainage bag. Subsequent draining urine was blood tinged without clots.   Recommendations: - Continue foley catheter to drainage on discharge. Foley catheter should be exchanged in his facility monthly for CAUTI prevention. His urethral injury should be well healed by the time of his next catheter exchange. [place] for facility RN to exchange next catheter, but they should be made aware that there is 30cc in the balloon of the current foley - Please flush foley as needed for decreased/no urine output. If unable to flush foley catheter, please page urology  - Agree with empiric antibiotics for likely urosepsis given traumatic foley catheter placement. Follow up cultures and narrow/adjust antibiotics accordingly - We expect hematuria to resolve with hydration and foley catheter placement  - Patient to follow up with his local urologist

## 2020-12-30 NOTE — Progress Notes (Signed)
Subjective: Febrile overnight to Tmax 101.8 and tachycardic to 100-170s. On Norepi 26 and vasopressin 0.03. New leukocytosis to 38.3. Lactic acid now downtrending. Urine and blood cultures pending. On empiric abx of cefepime and vanc.   Foley draining pink-tinged urine with sediment in tubing. 500cc urine output since placement. Easily flushed this morning with removal of a few small clots. Cr increased to 1.86 from 1.5 on admission from normal baseline. No hydronephrosis on recent CT.   Objective: Vital signs in last 24 hours: Temp:  [99.2 F (37.3 C)-102.6 F (39.2 C)] 101.8 F (38.8 C) (04/04 0550) Pulse Rate:  [93-174] 122 (04/04 0700) Resp:  [18-50] 29 (04/04 0700) BP: (59-145)/(30-115) 97/44 (04/04 0600) SpO2:  [83 %-100 %] 100 % (04/04 0700) Arterial Line BP: (90-136)/(39-68) 136/50 (04/04 0700) Weight:  [98.6 kg-99.8 kg] 98.6 kg (04/04 0500)  Intake/Output from previous day: 04/03 0701 - 04/04 0700 In: 7264.6 [I.V.:1372.8; Blood:1575; IV Piggyback:4316.8] Out: 500 [Urine:500] Intake/Output this shift: No intake/output data recorded.  Physical Exam:  General: Alert and oriented, laying in bed in NAD CV: Tachycardic  Lungs: NWOB GU: 18 Fr council-tipped foley in place draining light pink urine with sediment in tubing  Ext: Warm and well-perfused   Lab Results: Recent Labs    12/29/20 1635 12/29/20 1857 12/30/20 0248  HGB 11.9* 8.4* 10.9*  HCT 37.8* 26.8* 32.3*   BMET Recent Labs    12/29/20 1635 12/30/20 0248  NA 139 134*  K 3.8 3.9  CL 101 104  CO2 23 19*  GLUCOSE 132* 125*  BUN 13 17  CREATININE 1.50* 1.86*  CALCIUM 8.8* 7.5*     Studies/Results: DG Chest Portable 1 View  Result Date: 12/29/2020 CLINICAL DATA:  Central line placement EXAM: PORTABLE CHEST 1 VIEW COMPARISON:  December 30, 2015 FINDINGS: A right central line terminates in the central SVC. No pneumothorax. The platelike opacity in left base is better seen on the previous study due to  rotation on the current study. However, this persistent is likely atelectasis. Mild oval opacity in the periphery of the right lung base was not seen earlier today. No other acute abnormalities. IMPRESSION: 1. The right central line terminates in the central SVC without pneumothorax. 2. New oval opacity in the lateral right lung base could represent atelectasis or developing infiltrate. Recommend attention on short-term follow-up. 3. Persistent opacity in the left base is better seen earlier today. Earlier today, this opacity appeared to represent atelectasis. Electronically Signed   By: Gerome Sam III M.D   On: 12/29/2020 19:26   DG Chest Port 1 View  Result Date: 12/29/2020 CLINICAL DATA:  Questionable sepsis. EXAM: PORTABLE CHEST 1 VIEW COMPARISON:  November 17, 2020 FINDINGS: Platelike opacity in left base is likely atelectasis. The heart, hila, mediastinum, lungs, and pleura are otherwise unremarkable. IMPRESSION: Platelike opacity in left base is likely subsegmental atelectasis. No other abnormalities. Electronically Signed   By: Gerome Sam III M.D   On: 12/29/2020 17:38   CT Angio Chest/Abd/Pel for Dissection W and/or Wo Contrast  Result Date: 12/29/2020 CLINICAL DATA:  Abdominal pain. Chest pain with concern for dissection. EXAM: CT ANGIOGRAPHY CHEST, ABDOMEN AND PELVIS TECHNIQUE: Non-contrast CT of the chest was initially obtained. Multidetector CT imaging through the chest, abdomen and pelvis was performed using the standard protocol during bolus administration of intravenous contrast. Multiplanar reconstructed images and MIPs were obtained and reviewed to evaluate the vascular anatomy. CONTRAST:  OMNIPAQUE IOHEXOL 350 MG/ML SOLN COMPARISON:  CT dated 10/29/2020 FINDINGS:  CTA CHEST FINDINGS Cardiovascular: Exam is somewhat degraded by motion artifact. There is no evidence for thoracic aortic dissection or aneurysm. No definite large centrally located pulmonary embolism. The heart size  is stable from prior study. There is no significant pericardial effusion. Mediastinum/Nodes: -- No mediastinal lymphadenopathy. -- No hilar lymphadenopathy. -- No axillary lymphadenopathy. -- No supraclavicular lymphadenopathy. -- Normal thyroid gland where visualized. -  Unremarkable esophagus. Lungs/Pleura: There are linear airspace opacities at the lung bases. No large pleural effusion. No pneumothorax. Musculoskeletal: No chest wall abnormality. No bony spinal canal stenosis. Review of the MIP images confirms the above findings. CTA ABDOMEN AND PELVIS FINDINGS VASCULAR Aorta: Normal caliber aorta without aneurysm, dissection, vasculitis or significant stenosis. Celiac: Patent without evidence of aneurysm, dissection, vasculitis or significant stenosis. SMA: Patent without evidence of aneurysm, dissection, vasculitis or significant stenosis. Renals: Both renal arteries are patent without evidence of aneurysm, dissection, vasculitis, fibromuscular dysplasia or significant stenosis. IMA: Patent without evidence of aneurysm, dissection, vasculitis or significant stenosis. Inflow: Patent without evidence of aneurysm, dissection, vasculitis or significant stenosis. Veins: No obvious venous abnormality within the limitations of this arterial phase study. There are pockets of gas within the right common femoral vein which may related to venous injection of air during contrast administration. Review of the MIP images confirms the above findings. NON-VASCULAR Lower chest: The lung bases are clear. The heart size is normal. Hepatobiliary: The liver is normal. Normal gallbladder.There is no biliary ductal dilation. Pancreas: Normal contours without ductal dilatation. No peripancreatic fluid collection. Spleen: Unremarkable. Adrenals/Urinary Tract: --Adrenal glands: Unremarkable. --Right kidney/ureter: No hydronephrosis or radiopaque kidney stones. --Left kidney/ureter: No hydronephrosis or radiopaque kidney stones.  --Urinary bladder: There is gas within the urinary bladder, presumably from prior instrumentation. Stomach/Bowel: --Stomach/Duodenum: No hiatal hernia or other gastric abnormality. Normal duodenal course and caliber. --Small bowel: Unremarkable. --Colon: There is mild rectal wall thickening which is somewhat similar to prior study. --Appendix: Normal. Vascular/Lymphatic: Normal course and caliber of the major abdominal vessels. --No retroperitoneal lymphadenopathy. --No mesenteric lymphadenopathy. --No pelvic or inguinal lymphadenopathy. Reproductive: The Foley catheter bulb is inflated were proximal urethra. There are adjacent pockets of gas and free fluid (axial series 5, image 213). 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 Other: No ascites or free air. There is a fat and fluid containing right inguinal hernia. Musculoskeletal. No acute displaced fractures. Review of the MIP images confirms the above findings. IMPRESSION: 1. Motion degraded study. 2. Malpositioned Foley catheter bulb, currently CT weighted in the membranous part of the urethra. There are adjacent pockets of gas and free fluid concerning for a traumatic urethral injury. Repositioning is recommended. Urology consultation should be considered. 3. No evidence for an aortic dissection.  No acute abnormality. 4. Chronic findings as detailed above, not substantially changed from recent prior studies. These results will be called to the ordering clinician or representative by the Radiologist Assistant, and communication documented in the PACS or [employer]. Electronically Signed   By: Katherine Mantle M.D.   On: 12/29/2020 19:05    Assessment/Plan: Vincent Anthony is a 50yo male with progressive MS and resulting overactive bladder, managed by a foley catheter for the past month for symptom control, who was due for routine foley catheter exchange on 12/29/20 at his rehab facility; however, it seems that the balloon of the new catheter was  inflated in the urethra. The foley subsequently failed to drain, and the patient developed bleeding around the foley catheter, for which he presented to the ED.  Not on blood thinners. He subsequently developed signs concerning for urosepsis in the ED. CT A/P without signs of bladder clot or hydronephrossi. He was transferred to [facility] ICU for intensive care. Urology placed an 18 Fr council-tipped foley catheter over a glidewire with 30cc in balloon (to prevent balloon from slipping into prostatic urethral defect).  Patient is still febrile and requiring pressor support. On broad spectrum antibiotics with cultures pending. He has had good urine output via foley catheter with mild hematuria and fairly stable Hgb this morning. Increasing Cr likely due to sepsis; no evidence of obstructive uropathy.   - Continue foley catheter to drainage on discharge. Foley catheter should be exchanged in his facility monthly for CAUTI prevention. His urethral injury should be well healed by the time of his next catheter exchange. Ok for facility RN to exchange next catheter, but they should be made aware that there is 30cc in the balloon of the current foley - Please flush foley as needed for decreased/no urine output. If unable to flush foley catheter, please page urology  - Agree with empiric antibiotics for likely urosepsis given traumatic foley catheter placement. Follow up cultures and narrow/adjust antibiotics accordingly - We expect hematuria to resolve with hydration and foley catheter placement  - Patient to follow up with his local urologist      LOS: 1 day   Margette Fast 12/30/2020, 7:54 AM

## 2020-12-30 NOTE — Progress Notes (Signed)
eLink Physician-Brief Progress Note Patient Name: Vincent Anthony DOB: Jun 27, 1971 MRN: [id number]   Date of Service  12/30/2020  HPI/Events of Note  Anxiety endorsed by patient. Appears he may have been receiving Klonopin PTA.   eICU Interventions  Will order Ativan 1mg  PO BID PRN anxiety.     Intervention Category Minor Interventions: Agitation / anxiety - evaluation and management  12/30/2020, 5:23 AM

## 2020-12-30 NOTE — Progress Notes (Addendum)
NAME:  Vincent Anthony, MRN:  [id number], DOB:  1971/04/09, LOS: 1 ADMISSION DATE:  12/29/2020, CONSULTATION DATE:  12/30/20 REFERRING MD:  [facility], CHIEF COMPLAINT:    History of Present Illness:  50 y/o M with MS, overactive bladder with indwelling foley for 1 month who [facility] after a traumatic foley exchange attempt with hematuria. The catheter was placed on 3/3, exchange attempted on 4/3 with no urinary output.  After removal, he developed profuse bleeding. CBI attempted, but unable to flush catheter.  Additional catheter attempted in the ER.  While in ER, he developed fever to 102.6, dyspnea, hypotension, tachycardia and shivering. He had a brief loss of consciousness in the ER - questionable seizure vs rigors/hypotension from sepsis, blood loss.  He was transfused 1 unit PRBC, started on cefepime + vancomycin, and empiric keppra.  He was transferred to [facility] and evaluated by Urology (they note concerns for balloon inflation in the urethra). He required levophed + vasopressin for hypotension, 5L IVF and hydrocortisone.   Pertinent  Medical History  MS - baseline mostly bed bound, OOB with assist, requires feeding assist Arthritis Transaminitis GERD Tremor?  Seizures?  Neurogenic bladder COVID - 11/17/20  Significant Hospital Events: Including procedures, antibiotic start and stop dates in addition to other pertinent events    4/03 Admit with hematuria, malpositioned foley, septic shock  4/04 On levophed, vasopressin, urine less bloody. Cultures pending. Cefepime + Vanco  Interim History / Subjective:  Tmax 99.4 / WBC 38.3 Remains on levophed, vasopressin [place] O2 4L  Glucose range - 125-130 I/O - UOP, +6.7L in last 24 hours  Objective   Blood pressure (!) 118/59, pulse (!) 126, temperature 99.4 F (37.4 C), temperature source Oral, resp. rate (!) 27, height 6\' 3"  (1.905 m), weight 98.6 kg, SpO2 97 %. CVP:  [5 mmHg-8 mmHg] 8 mmHg       Intake/Output Summary (Last 24 hours) at 12/30/2020 0917 Last data filed at 12/30/2020 0900 Gross per 24 hour  Intake 7574.18 ml  Output 500 ml  Net 7074.18 ml   Filed Weights   12/29/20 1827 12/30/20 0500  Weight: 99.8 kg 98.6 kg    Examination: General: chronically ill appearing adult male lying in bed in NAD HEENT: MM pink/moist, [place] O2, anicteric Neuro: AAOx4, speech clear, changes consistent with MS, gross motor of upper extremities, contracture of right hand CV: s1s2 RRR, ST on monitor, no m/r/g PULM: non-labored on [place] O2, lungs bilaterally clear GI: soft, bsx4 active, tolerating PO's GU: foley catheter in place, tea colored urine, no clots Extremities: warm/dry, trace dependent edema Skin: no rashes or lesions      Resolved Hospital Problem list     Assessment & Plan:   Shock  Suspect possible element of sepsis + blood loss with hematuria & relative AI with chronic steroid use.  Hx E-Coli UTI 07/2020.  Transfused 2 unit.   -continue broad spectrum abx  -follow cultures to maturity  -continue stress dose steroids  -reduce IVF to 33ml/hr  -continue levophed, vasopressin  -assess PCT, lactic acid -follow up CXR in am to ensure no changes post hydration  Hematuria secondary to Malpositioned Foley  Required Urology placement of current foley. No hydronephrosis on CT.  -follow urinary output  -monitor for further bleeding  -flush foley PRN per Urology recommendations -IVF as above  -appreciate Urology > note rec's for ok for facility RN to change catheter but need to be aware it  is a 30 ml balloon  -outpatient follow up with primary Urology at discharge   Urinary Retention  -continue ditropan   AKI  Lactic Acidosis  -Trend BMP / urinary output -Replace electrolytes as indicated -Avoid nephrotoxic agents, ensure adequate renal perfusion -repeat lactic acid   Questionable Seizure vs Rigors from Fever  -based on hx and patient description, suspect this was  rigors with fever to 102.6 and hypotension / AMS  -will continue keppra for now, consider stopping > will discuss with MD  -no further evidence of seizure activity   Anemia -trend CBC  -transfuse for Hgb <7% or active bleeding   DM  -SSI -hold metformin   MS  -continue baclofen, lyrica   -continue PT efforts as able, hopeful to avoid further deconditioning of critical illness in setting of MS  HLD  -continue pravachol   Constipation  -continue home linzess  Anxiety  -PRN ativan     Best practice (right click and "Reselect all SmartList Selections" daily)  Diet:  Oral Pain/Anxiety/Delirium protocol (if indicated): No VAP protocol (if indicated): Not indicated DVT prophylaxis: SCDs, no heparin given bleeding GI prophylaxis: PPI Glucose control:  SSI Yes Central venous access:  Yes still needed Arterial line:  Yes, and it is still needed Foley:  Yes, and it is still needed Mobility:  bed rest  PT consulted: N/A Last date of multidisciplinary goals of care discussion:   Code Status:  full code Disposition: ICU   Critical care time: 32 minutes    Canary Brim, MSN, APRN, NP-C, AGACNP-BC [facility] 12/30/2020, 10:19 AM   Please see [email or web address] for pager details.   From 7A-7P if no response, please call [phone] After hours, please call ELink [phone]

## 2020-12-31 ENCOUNTER — Inpatient Hospital Stay (HOSPITAL_COMMUNITY): Payer: Medicare HMO

## 2020-12-31 DIAGNOSIS — R7881 Bacteremia: Secondary | ICD-10-CM | POA: Diagnosis present

## 2020-12-31 DIAGNOSIS — D849 Immunodeficiency, unspecified: Secondary | ICD-10-CM | POA: Diagnosis not present

## 2020-12-31 DIAGNOSIS — A419 Sepsis, unspecified organism: Secondary | ICD-10-CM | POA: Diagnosis not present

## 2020-12-31 DIAGNOSIS — A4181 Sepsis due to Enterococcus: Secondary | ICD-10-CM

## 2020-12-31 DIAGNOSIS — N179 Acute kidney failure, unspecified: Secondary | ICD-10-CM

## 2020-12-31 DIAGNOSIS — R6521 Severe sepsis with septic shock: Secondary | ICD-10-CM | POA: Diagnosis not present

## 2020-12-31 LAB — BASIC METABOLIC PANEL
Anion gap: 11 (ref 5–15)
BUN: 19 mg/dL (ref 6–20)
CO2: 21 mmol/L — ABNORMAL LOW (ref 22–32)
Calcium: 7.4 mg/dL — ABNORMAL LOW (ref 8.9–10.3)
Chloride: 100 mmol/L (ref 98–111)
Creatinine, Ser: 1.35 mg/dL — ABNORMAL HIGH (ref 0.61–1.24)
GFR, Estimated: >60 mL/min (ref >60)
Glucose, Bld: 118 mg/dL — ABNORMAL HIGH (ref 70–99)
Potassium: 3.5 mmol/L (ref 3.5–5.1)
Sodium: 132 mmol/L — ABNORMAL LOW (ref 135–145)

## 2020-12-31 LAB — CBC
HCT: 27.9 % — ABNORMAL LOW (ref 39.0–52.0)
Hemoglobin: 9.7 g/dL — ABNORMAL LOW (ref 13.0–17.0)
MCH: 28 pg (ref 26.0–34.0)
MCHC: 34.8 g/dL (ref 30.0–36.0)
MCV: 80.6 fL (ref 80.0–100.0)
Platelets: 90 10*3/uL — ABNORMAL LOW (ref 150–400)
RBC: 3.46 MIL/uL — ABNORMAL LOW (ref 4.22–5.81)
RDW: 16.8 % — ABNORMAL HIGH (ref 11.5–15.5)
WBC: 40 10*3/uL — ABNORMAL HIGH (ref 4.0–10.5)
nRBC: 0 % (ref 0.0–0.2)

## 2020-12-31 LAB — GLUCOSE, CAPILLARY
Glucose-Capillary: 118 mg/dL — ABNORMAL HIGH (ref 70–99)
Glucose-Capillary: 114 mg/dL — ABNORMAL HIGH (ref 70–99)
Glucose-Capillary: 121 mg/dL — ABNORMAL HIGH (ref 70–99)
Glucose-Capillary: 121 mg/dL — ABNORMAL HIGH (ref 70–99)
Glucose-Capillary: 113 mg/dL — ABNORMAL HIGH (ref 70–99)

## 2020-12-31 LAB — PROCALCITONIN: Procalcitonin: >150 ng/mL

## 2020-12-31 LAB — URINE CULTURE: Culture: NO GROWTH

## 2020-12-31 LAB — LACTIC ACID, PLASMA: Lactic Acid, Venous: 2.6 mmol/L (ref 0.5–1.9)

## 2020-12-31 IMAGING — DX DG CHEST 1V PORT
1 series · 1 of 1 positions shown · non-contrast
Comparison: [DATE].

CLINICAL DATA: Respiratory failure.

EXAM:
PORTABLE CHEST 1 VIEW

[chest ap]
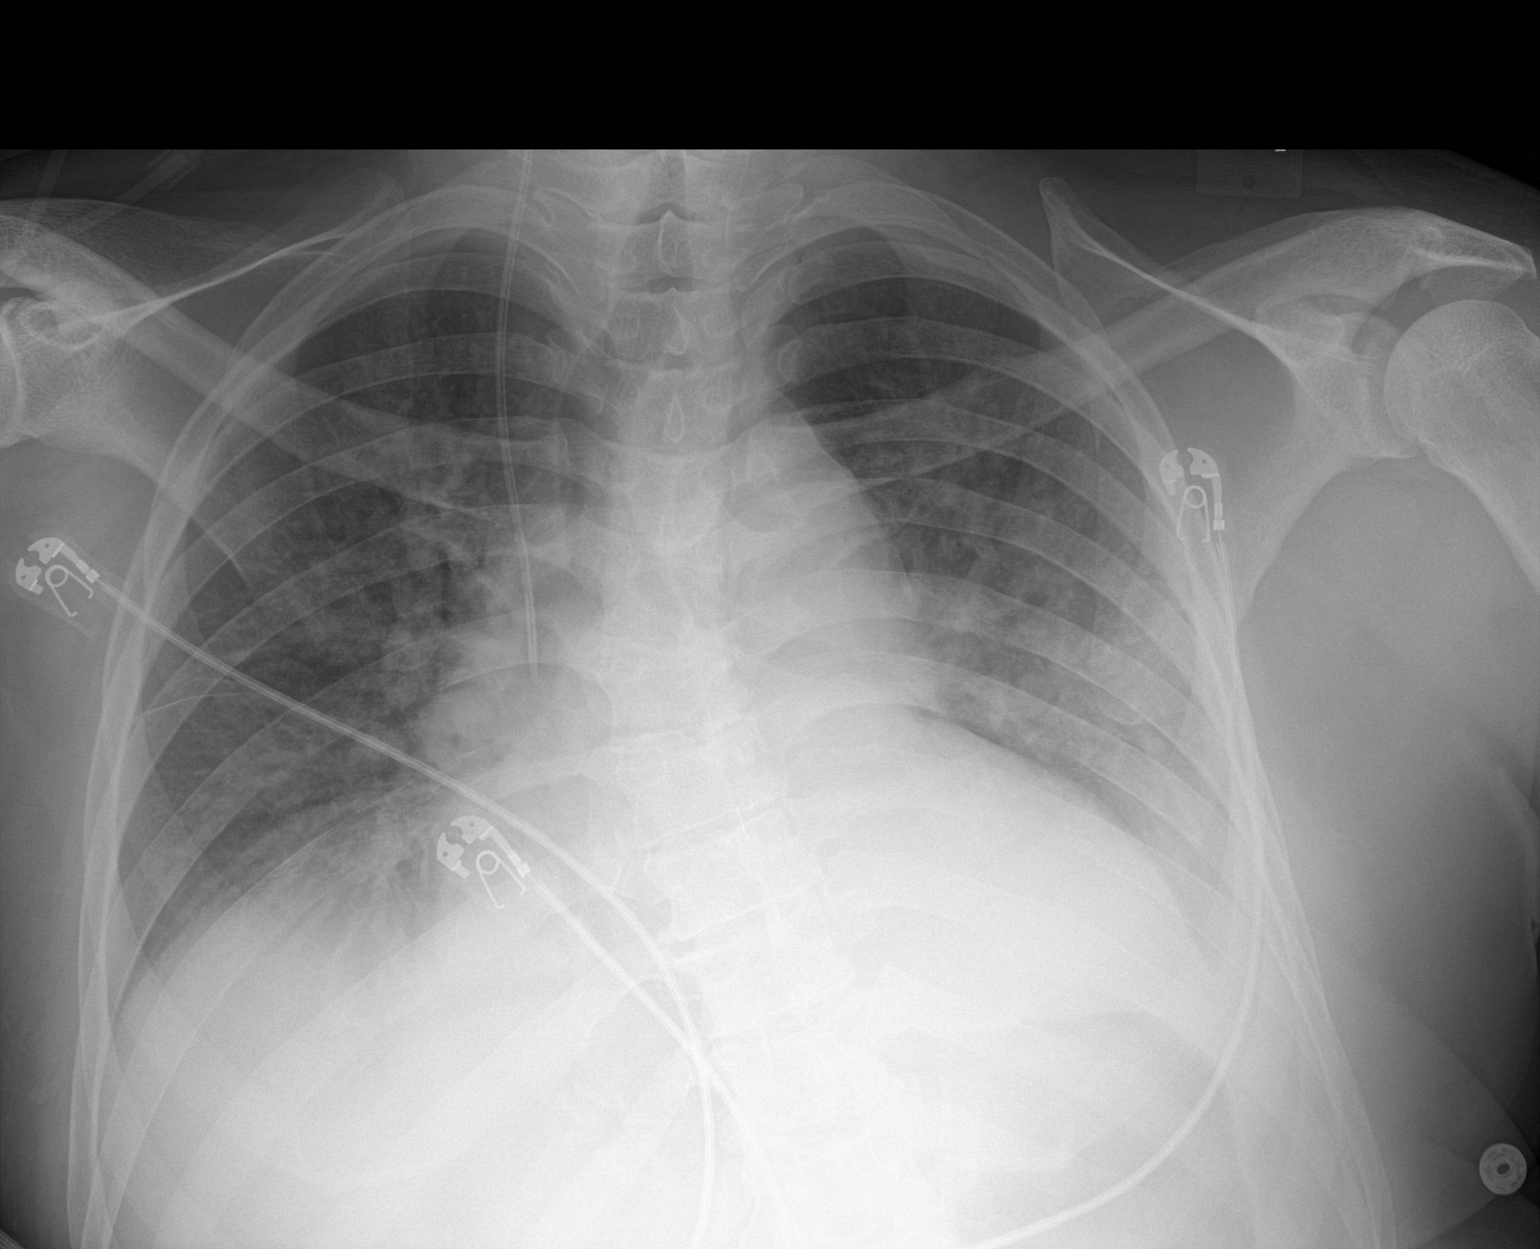

[1 of 1 positions shown; findings below may reference images not displayed]

FINDINGS: Right IJ line noted in stable position. Cardiomegaly. Diffuse
bilateral pulmonary infiltrates/edema noted on today's exam.
Previously question density in the right lung base no longer
identified. Findings suggest CHF. Small left pleural effusion. No
pneumothorax. Thoracic spine scoliosis and degenerative change.
IMPRESSION: 1.  Right IJ line stable position.

2. Cardiomegaly. Diffuse bilateral pulmonary infiltrates/edema and
small left pleural effusion noted on today's exam. Findings suggest
CHF.

## 2020-12-31 MED ORDER — LACTATED RINGERS IV BOLUS: 1000.0000 mL | Freq: Once | INTRAVENOUS | Status: AC

## 2020-12-31 MED ORDER — VANCOMYCIN HCL 1250 MG/250ML IV SOLN
1250.0000 mg | Freq: Two times a day (BID) | INTRAVENOUS | Status: DC
  Administered 2020-12-31 (×2): 1250 mg via INTRAVENOUS
  Filled 2020-12-31 (×3): qty 250

## 2020-12-31 NOTE — Progress Notes (Addendum)
Subjective: Fever curve overall improving but with persistent intermittent fevers overnight. Tachycardia improving, HR 100-130s. Weaning Norepi, now down to 5; still on vaso 0.3. Ucx pending. Prelim Bcx x 1 with enterococcus and E Coli. On empiric abx of cefepime and vanc.   Foley draining yellow urine with sediment in tubing. Great output. Cr now downtrending.  Objective: Vital signs in last 24 hours: Temp:  [99 F (37.2 C)-101.4 F (38.6 C)] 99 F (37.2 C) (04/05 0351) Pulse Rate:  [107-136] 108 (04/05 0600) Resp:  [24-40] 29 (04/05 0600) BP: (114-118)/(59-60) 118/59 (04/04 0800) SpO2:  [91 %-99 %] 96 % (04/05 0600) Arterial Line BP: (100-165)/(34-58) 106/55 (04/05 0600)  Intake/Output from previous day: 04/04 0701 - 04/05 0700 In: 4227.7 [P.O.:840; I.V.:2987.5; IV Piggyback:400.2] Out: 4175 [Urine:3775; Stool:400] Intake/Output this shift: No intake/output data recorded.  Physical Exam:  General: Alert and oriented, laying in bed in NAD CV: Tachycardic  Lungs: NWOB GU: 18 Fr council-tipped foley in place draining yellow urine with sediment in tubing  Ext: Warm and well-perfused   Lab Results: Recent Labs    12/29/20 1857 12/30/20 0248 12/31/20 0343  HGB 8.4* 10.9* 9.7*  HCT 26.8* 32.3* 27.9*   BMET Recent Labs    12/30/20 0248 12/31/20 0343  NA 134* 132*  K 3.9 3.5  CL 104 100  CO2 19* 21*  GLUCOSE 125* 118*  BUN 17 19  CREATININE 1.86* 1.35*  CALCIUM 7.5* 7.4*     Studies/Results: DG CHEST PORT 1 VIEW  Result Date: 12/31/2020 CLINICAL DATA:  Respiratory failure. EXAM: PORTABLE CHEST 1 VIEW COMPARISON:  12/29/2020. FINDINGS: Right IJ line noted in stable position. Cardiomegaly. Diffuse bilateral pulmonary infiltrates/edema noted on today's exam. Previously question density in the right lung base no longer identified. Findings suggest CHF. Small left pleural effusion. No pneumothorax. Thoracic spine scoliosis and degenerative change. IMPRESSION: 1.   Right IJ line stable position. 2. Cardiomegaly. Diffuse bilateral pulmonary infiltrates/edema and small left pleural effusion noted on today's exam. Findings suggest CHF. Electronically Signed   By: Vincent Anthony  Register   On: 12/31/2020 05:45   DG Chest Portable 1 View  Result Date: 12/29/2020 CLINICAL DATA:  Central line placement EXAM: PORTABLE CHEST 1 VIEW COMPARISON:  December 30, 2015 FINDINGS: A right central line terminates in the central SVC. No pneumothorax. The platelike opacity in left base is better seen on the previous study due to rotation on the current study. However, this persistent is likely atelectasis. Mild oval opacity in the periphery of the right lung base was not seen earlier today. No other acute abnormalities. IMPRESSION: 1. The right central line terminates in the central SVC without pneumothorax. 2. New oval opacity in the lateral right lung base could represent atelectasis or developing infiltrate. Recommend attention on short-term follow-up. 3. Persistent opacity in the left base is better seen earlier today. Earlier today, this opacity appeared to represent atelectasis. Electronically Signed   By: Gerome Sam III M.D   On: 12/29/2020 19:26   DG Chest Port 1 View  Result Date: 12/29/2020 CLINICAL DATA:  Questionable sepsis. EXAM: PORTABLE CHEST 1 VIEW COMPARISON:  November 17, 2020 FINDINGS: Platelike opacity in left base is likely atelectasis. The heart, hila, mediastinum, lungs, and pleura are otherwise unremarkable. IMPRESSION: Platelike opacity in left base is likely subsegmental atelectasis. No other abnormalities. Electronically Signed   By: Gerome Sam III M.D   On: 12/29/2020 17:38   CT Angio Chest/Abd/Pel for Dissection W and/or Wo Contrast  Result Date: 12/29/2020  CLINICAL DATA:  Abdominal pain. Chest pain with concern for dissection. EXAM: CT ANGIOGRAPHY CHEST, ABDOMEN AND PELVIS TECHNIQUE: Non-contrast CT of the chest was initially obtained. Multidetector CT imaging  through the chest, abdomen and pelvis was performed using the standard protocol during bolus administration of intravenous contrast. Multiplanar reconstructed images and MIPs were obtained and reviewed to evaluate the vascular anatomy. CONTRAST:  OMNIPAQUE IOHEXOL 350 MG/ML SOLN COMPARISON:  CT dated 10/29/2020 FINDINGS: CTA CHEST FINDINGS Cardiovascular: Exam is somewhat degraded by motion artifact. There is no evidence for thoracic aortic dissection or aneurysm. No definite large centrally located pulmonary embolism. The heart size is stable from prior study. There is no significant pericardial effusion. Mediastinum/Nodes: -- No mediastinal lymphadenopathy. -- No hilar lymphadenopathy. -- No axillary lymphadenopathy. -- No supraclavicular lymphadenopathy. -- Normal thyroid gland where visualized. -  Unremarkable esophagus. Lungs/Pleura: There are linear airspace opacities at the lung bases. No large pleural effusion. No pneumothorax. Musculoskeletal: No chest wall abnormality. No bony spinal canal stenosis. Review of the MIP images confirms the above findings. CTA ABDOMEN AND PELVIS FINDINGS VASCULAR Aorta: Normal caliber aorta without aneurysm, dissection, vasculitis or significant stenosis. Celiac: Patent without evidence of aneurysm, dissection, vasculitis or significant stenosis. SMA: Patent without evidence of aneurysm, dissection, vasculitis or significant stenosis. Renals: Both renal arteries are patent without evidence of aneurysm, dissection, vasculitis, fibromuscular dysplasia or significant stenosis. IMA: Patent without evidence of aneurysm, dissection, vasculitis or significant stenosis. Inflow: Patent without evidence of aneurysm, dissection, vasculitis or significant stenosis. Veins: No obvious venous abnormality within the limitations of this arterial phase study. There are pockets of gas within the right common femoral vein which may related to venous injection of air during contrast  administration. Review of the MIP images confirms the above findings. NON-VASCULAR Lower chest: The lung bases are clear. The heart size is normal. Hepatobiliary: The liver is normal. Normal gallbladder.There is no biliary ductal dilation. Pancreas: Normal contours without ductal dilatation. No peripancreatic fluid collection. Spleen: Unremarkable. Adrenals/Urinary Tract: --Adrenal glands: Unremarkable. --Right kidney/ureter: No hydronephrosis or radiopaque kidney stones. --Left kidney/ureter: No hydronephrosis or radiopaque kidney stones. --Urinary bladder: There is gas within the urinary bladder, presumably from prior instrumentation. Stomach/Bowel: --Stomach/Duodenum: No hiatal hernia or other gastric abnormality. Normal duodenal course and caliber. --Small bowel: Unremarkable. --Colon: There is mild rectal wall thickening which is somewhat similar to prior study. --Appendix: Normal. Vascular/Lymphatic: Normal course and caliber of the major abdominal vessels. --No retroperitoneal lymphadenopathy. --No mesenteric lymphadenopathy. --No pelvic or inguinal lymphadenopathy. Reproductive: The Foley catheter bulb is inflated were proximal urethra. There are adjacent pockets of gas and free fluid (axial series 5, image 213). 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 Other: No ascites or free air. There is a fat and fluid containing right inguinal hernia. Musculoskeletal. No acute displaced fractures. Review of the MIP images confirms the above findings. IMPRESSION: 1. Motion degraded study. 2. Malpositioned Foley catheter bulb, currently CT weighted in the membranous part of the urethra. There are adjacent pockets of gas and free fluid concerning for a traumatic urethral injury. Repositioning is recommended. Urology consultation should be considered. 3. No evidence for an aortic dissection.  No acute abnormality. 4. Chronic findings as detailed above, not substantially changed from recent prior studies. These results will  be called to the ordering clinician or representative by the Radiologist Assistant, and communication documented in the PACS or [employer]. Electronically Signed   By: Katherine Mantle M.D.   On: 12/29/2020 19:05    Assessment/Plan: Vincent Anthony  Vincent Anthony is a 50yo male with progressive MS and resulting overactive bladder, managed by a foley catheter for the past month for symptom control, who was due for routine foley catheter exchange on 12/29/20 at his rehab facility; however, it seems that the balloon of the new catheter was inflated in the urethra. The foley subsequently failed to drain, and the patient developed bleeding around the foley catheter, for which he presented to the ED. Not on blood thinners. He subsequently developed signs concerning for urosepsis in the ED. CT A/P without signs of bladder clot or hydronephrosis. He was transferred to [facility] ICU for intensive care. Urology placed an 18 Fr council-tipped foley catheter over a glidewire with 30cc in balloon (to prevent balloon from slipping into prostatic urethral defect) on 4/3.  The patient is overall improving from an infectious standpoint on broad spectrum antibiotics and bladder decompression. His hematuria has resolved.  - Continue foley catheter to drainage on discharge. Foley catheter should be exchanged in his facility monthly for CAUTI prevention. His urethral injury should be well healed by the time of his next catheter exchange. Ok for facility RN to exchange next catheter, but they should be made aware that there is 30cc in the balloon of the current foley - Please flush foley as needed for decreased/no urine output. If unable to flush foley catheter, please page urology  - Agree with empiric antibiotics for likely urosepsis given traumatic foley catheter placement. Follow up cultures and narrow/adjust antibiotics accordingly - Patient to follow up with his local urologist  - Urology will sign off at this time     LOS: 2  days   Margette Fast 12/31/2020, 7:34 AM  I agree with the assessment and plan as outlined by Dr. Lindajo Royal

## 2020-12-31 NOTE — Progress Notes (Signed)
eLink Physician-Brief Progress Note Patient Name: Vincent Anthony DOB: [birth year] MRN: [id number]   Date of Service  12/31/2020  HPI/Events of Note  Lactic acid trending upwards to 5.3 this morning and patient is growing enterococcus in the blood. Patient is not hypervolemic. He is hemodynamically stable.  eICU Interventions  LR 1000 ml iv fluid bolus at 250 ml / hour.        Thomasene Lot Granite Godman 12/31/2020, 5:46 AM

## 2020-12-31 NOTE — Progress Notes (Addendum)
NAME:  Vincent Anthony, MRN:  [id number], DOB:  22-Jan-1971, LOS: 2 ADMISSION DATE:  12/29/2020, CONSULTATION DATE:  12/30/20 REFERRING MD:  [facility], CHIEF COMPLAINT:    History of Present Illness:  50 y/o M with MS, overactive bladder with indwelling foley for 1 month who [facility] after a traumatic foley exchange attempt with hematuria. The catheter was placed on 3/3, exchange attempted on 4/3 with no urinary output.  After removal, he developed profuse bleeding. CBI attempted, but unable to flush catheter.  Additional catheter attempted in the ER.  While in ER, he developed fever to 102.6, dyspnea, hypotension, tachycardia and shivering. He had a brief loss of consciousness in the ER - questionable seizure vs rigors/hypotension from sepsis, blood loss.  He was transfused 1 unit PRBC, started on cefepime + vancomycin, and empiric keppra.  He was transferred to [facility] and evaluated by Urology (they note concerns for balloon inflation in the urethra). He required levophed + vasopressin for hypotension, 5L IVF and hydrocortisone.   Pertinent  Medical History  MS - baseline mostly bed bound, OOB with assist, requires feeding assist Arthritis Transaminitis GERD Tremor?  Seizures?  Neurogenic bladder COVID - 11/17/20  Significant Hospital Events: Including procedures, antibiotic start and stop dates in addition to other pertinent events    4/03 Admit with hematuria, malpositioned foley, septic shock  4/04 On levophed, vasopressin, urine less bloody. Cultures pending. Cefepime + Vanco  4/5 BC growing E-Coli & GPC, urine culture pending. Keppra stopped.   Interim History / Subjective:  Tmax 101.4 / WBC 40  [employer] O2 2L  Glucose range 83-118 I/O 3.7L UOP, 400 ml Stool, +52 ml in last 24 hours  Levophed weaned to , on vaso  Pt reports feeling better overall  Objective   Blood pressure (!) 118/59, pulse 95, temperature 99 F (37.2 C), temperature source  Oral, resp. rate (!) 27, height 6\' 3"  (1.905 m), weight 98.6 kg, SpO2 97 %. CVP:  [5 mmHg-8 mmHg] 7 mmHg      Intake/Output Summary (Last 24 hours) at 12/31/2020 0913 Last data filed at 12/31/2020 03/02/2021 Gross per 24 hour  Intake 3678.06 ml  Output 4175 ml  Net -496.94 ml   Filed Weights   12/29/20 1827 12/30/20 0500  Weight: 99.8 kg 98.6 kg    Examination: General: chronically ill appearing adult male lying in bed in NAD HEENT: MM pink/moist, R IJ TLC c/d/i, pupils reactive, anicteric  Neuro: AAOx4, speech clear, chronic extremity changes c/w MS, generalized weakness CV: s1s2 RRR, SR 80's, no m/r/g PULM: non-labored on [place] O2, lungs bilaterally clear GI: soft, bsx4 active  GU: foley in place with light tea colored urine (improved) Extremities: warm/dry, trace dependent edema  Skin: no rashes or lesions  PCXR 4/5 >> images personally reviewed, R IJ in good position, cardiomegaly, diffuse bilateral infiltrates / edema       Resolved Hospital Problem list     Assessment & Plan:   Septic Shock secondary to E-COLI + possible GPC Bacteremia Suspect possible element of sepsis + blood loss with hematuria & relative AI with chronic steroid use.  Hx E-Coli UTI 07/2020.  Transfused 2 unit.   -continue cefepime / vancomycin for now, narrow once sensitivities returned  -follow cultures to maturity, follow repeat blood cultures  -continue stress dose steroids -reduce IVF to 49ml/hr with mild edema on CXR -continue levophed, vasopressin for MAP >65 -trend PCT -defer TEE for now  -discontinue  aline  Hematuria secondary to Malpositioned Foley  Required Urology placement of current foley. No hydronephrosis on CT.  -follow urine, improved appearance 4/5  -flush foley PRN per Urology recommendations  -Appreciate Urology > note rec's for ok for facility RN to change catheter but need to be aware it is a 30 ml balloon  -will need outpatient follow up with primary Urology at  [facility]  Urinary Retention  -continue ditropan   AKI  Lactic Acidosis  -lactic acid cleared -Trend BMP / urinary output -Replace electrolytes as indicated -Avoid nephrotoxic agents, ensure adequate renal perfusion  Questionable Seizure vs Rigors from Fever  Based on hx and patient description, suspect this was rigors with fever to 102.6 and hypotension / AMS  -stop keppra as no further seizure concern, monitor for neuro changes  -if new AMS or abnormal motor movement, assess EEG   Anemia -trend CBC  -transfuse for Hgb<7% or active bleeding   DM  -SSI -hold home metformin   MS  -baclofen, lyrica -PT efforts as able  HLD  -pravachol   Constipation  -continue home linzess   Anxiety  -PRN ativan  Best practice (right click and "Reselect all SmartList Selections" daily)  Diet:  Oral Pain/Anxiety/Delirium protocol (if indicated): No VAP protocol (if indicated): Not indicated DVT prophylaxis: SCDs, no heparin given bleeding GI prophylaxis: PPI Glucose control:  SSI Yes Central venous access:  Yes still needed Arterial line:  Yes, and it is still needed Foley:  Yes, and it is still needed Mobility:  bed rest  PT consulted: N/A Last date of multidisciplinary goals of care discussion:   Code Status:  full code Disposition: ICU   Critical care time: 34 minutes    Canary Brim, MSN, APRN, NP-C, AGACNP-BC [facility] 12/31/2020, 9:13 AM   Please see [email or web address] for pager details.   From 7A-7P if no response, please call [phone] After hours, please call ELink [phone]

## 2020-12-31 NOTE — Consult Note (Signed)
[facility]    Date of Admission:  12/29/2020     Reason for Consult: enterococcus bacteremia, auto_champ consult    Referring Provider: Wynona Neat   CC: Septic shock Polymicrobial bacteremia, including enterococcus   Lines:  4/03-c right ij triple lumen 4/03-c left radial a-line  Urethral catheter   Abx: 4/03-c cefepime 4/03-c vancomycin  4/03 metronidazole        Assessment: Severe sepsis/septic shock Polymicrobial bacteremia including e faecalis aki improving immunosuppression  50 y.o. male with MS, neurogenic bladder with indwelling foley, admitted 4/03 in transfer from [facility] after traumatic foley exchange prior to admission leading to hematuria requiring admission, course complicated by sepsis onset in the ED and shock in setting of polymicrobial bacteremia   Appears vanc/cefepime had help with defervescence of fever. Wbc rising. Norepinephrine requirement some improvement  Source likely GU post procedures  Low denova score, can defer tee for now. Will repeat bcx though and if persistent enterococcus bacteremia, will need tee   Remiting/relapsing ms on chronic cd20 inhibitor. 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 No pertinent OI to worry at this time clinically.    Plan: 1. Continue with current vanc cefepime 2. abx modification based on susceptibility  3. Repeat blood culture; 4. Duration abx dependent on clearance of blood cx    I spent 60 minute reviewing data/chart, and coordinating care and >50% direct face to face time providing counseling/discussing diagnostics/treatment plan with patient      ------------------------------------------------ Active Problems:   Septic shock (HCC)    HPI: Vincent Anthony is a 50 y.o. male with MS, neurogenic bladder with indwelling foley, admitted 4/03 in transfer from [facility] after traumatic  foley exchange prior to admission leading to hematuria requiring admission, course complicated by sepsis onset in the ED and shock in setting of polymicrobial bacteremia  Patient had moderate covid infection and admitted 2/20-22 given baricitinib and d/c'ed to snf I reviewed his med list and it appears he received bactrim ds bid on 3/14. Unclear what is fore He reports he is now awared of any infectious issue prior to admission  On 4/03 as brought to ed due to traumatic foley exchange in snf leading to hematuria   While in the ED he developed a fever so was admitted His blood cx showed ecoli/e faecalis. Ct abd/pelv non sign of bladder clot or hydronephrosis. His fever is improving on vanc/cefepime. The wbc is rising but pressors requirement is coming down  Urology is involved. He continues to have no sign of significant ongoing urethral/bladder bleeding  He reports feeling better  Review of ms --> remitting-relapsing. Can't ambulate or stand at baseline, but able to situp. Has function of upper ext although they are weak at baseline. Takes ocrelizumab (anti-cd20 monoclonal antibody) every ?6 months.  Currently on vasopressin/NE, but the NE requirement had much decreased The urine appears dark but no blood clot from foley  Family History  Problem Relation Age of Onset  . Heart attack Mother   . Diabetes Father   . Hypertension Father   . Colon cancer Neg Hx     Social History   Tobacco Use  . Smoking status: Never Smoker  . Smokeless tobacco: Never Used  Substance Use Topics  . Alcohol use: No    Comment: occasionally  . Drug use: No    Allergies  Allergen Reactions  . Celebrex [Celecoxib] Nausea  And Vomiting  . Vioxx [Rofecoxib] Nausea And Vomiting  . Zanaflex [Tizanidine Hcl] Nausea And Vomiting  . Clindamycin Diarrhea    Review of Systems: ROS All Other ROS was negative, except mentioned above   Past Medical History:  Diagnosis Date  . Arthritis   . Elevated  LFTs   . Elevated liver enzymes   . GERD (gastroesophageal reflux disease)   . Headache   . MS (multiple sclerosis) (HCC)   . Neurogenic bladder   . Tremor        Scheduled Meds: . baclofen  10 mg Oral TID  . chlorhexidine  15 mL Mouth Rinse BID  . Chlorhexidine Gluconate Cloth  6 each Topical Daily  . hydrocortisone sod succinate (SOLU-CORTEF) inj  50 mg Intravenous Q6H  . insulin aspart  0-15 Units Subcutaneous Q4H  . linaclotide  145 mcg Oral QAC breakfast  . mouth rinse  15 mL Mouth Rinse q12n4p  . oxybutynin  10 mg Oral QHS  . pantoprazole  40 mg Oral QHS  . pravastatin  20 mg Oral q1800  . pregabalin  100 mg Oral BID   Continuous Infusions: . sodium chloride    . ceFEPime (MAXIPIME) IV Stopped (12/31/20 1221)  . lactated ringers Stopped (12/30/20 1500)  . lactated ringers 250 mL/hr at 12/31/20 0603  . norepinephrine (LEVOPHED) Adult infusion 5 mcg/min (12/31/20 0603)  . vancomycin Stopped (12/31/20 0940)  . vasopressin 0.03 Units/min (12/31/20 0807)   PRN Meds:.Place/Maintain arterial line **AND** sodium chloride, acetaminophen, docusate sodium, ondansetron (ZOFRAN) IV, polyethylene glycol   OBJECTIVE: Blood pressure 132/74, pulse 94, temperature 98 F (36.7 C), temperature source Oral, resp. rate (!) 25, height 6\' 3"  (1.905 m), weight 98.6 kg, SpO2 96 %.  Physical Exam Chronically ill appearing, no distress, conversant, cooperative Heent: normocephalic, per; conj clear; eomi Neck supple cv rrr no mrg Lungs clear; normal respiratory effort; on room air abd soft/nt Ext flexion contracture bilateral le and stiff; no edema Skin no rash Neuro spastic paresis bilateral LE >> bilateral UE; no clonus Psych alert/oriented msk no joint tenderness/swelling   Lab Results Lab Results  Component Value Date   WBC 40.0 (H) 12/31/2020   HGB 9.7 (L) 12/31/2020   HCT 27.9 (L) 12/31/2020   MCV 80.6 12/31/2020   PLT 90 (L) 12/31/2020    Lab Results  Component Value  Date   CREATININE 1.35 (H) 12/31/2020   BUN 19 12/31/2020   NA 132 (L) 12/31/2020   K 3.5 12/31/2020   CL 100 12/31/2020   CO2 21 (L) 12/31/2020    Lab Results  Component Value Date   ALT 38 12/29/2020   AST 25 12/29/2020   ALKPHOS 96 12/29/2020   BILITOT 0.6 12/29/2020      Microbiology: Recent Results (from the past 240 hour(s))  Urine culture     Status: None   Collection Time: 12/29/20  4:34 PM   Specimen: In/Out Cath Urine  Result Value Ref Range Status   Specimen Description   Final    IN/OUT CATH URINE Performed at [facility], [address], [place], [place] [address]    Special Requests   Final    NONE Performed at [facility], [address], [place], [place] [address]    Culture   Final    NO GROWTH Performed at [facility], [address], [place], [place] [address]    Report Status 12/31/2020 FINAL  Final  Blood culture (routine single)  Status: Abnormal (Preliminary result)   Collection Time: 12/29/20  4:35 PM   Specimen: Left Antecubital; Blood  Result Value Ref Range Status   Specimen Description   Final    LEFT ANTECUBITAL BOTTLES DRAWN AEROBIC AND ANAEROBIC Performed at [facility], [address] [place], [place] [address]    Special Requests   Final    Blood Culture adequate volume Performed at [facility], [address] [place], [place] [address]    Culture  Setup Time   Final    GRAM NEGATIVE RODS ANAEROBIC BOTTLE Gram Stain Report Called to,Read Back By and Verified With: HEAVNER,A@1211  BY MATTHEWS,B 4.4.22 PT @[facility]   Organism ID to follow GRAM POSITIVE COCCI ANAEROBIC BOTTLE ONLY CRITICAL RESULT CALLED TO, READ BACK BY AND VERIFIED WITH: PHARMD MELISSA M. [id number] FCP    Culture (A)  Final    ESCHERICHIA COLI SUSCEPTIBILITIES TO FOLLOW GRAM POSITIVE COCCI CULTURE REINCUBATED FOR BETTER GROWTH Performed at [facility], [address], [place], [place]    Report Status PENDING  Incomplete  Blood  Culture ID Panel (Reflexed)     Status: Abnormal   Collection Time: 12/29/20  4:35 PM  Result Value Ref Range Status   Enterococcus faecalis DETECTED (A) NOT DETECTED Final    Comment: CRITICAL RESULT CALLED TO, READ BACK BY AND VERIFIED WITH: PHARMD MELISSA M. 2002 [id number] FCP    Enterococcus Faecium NOT DETECTED NOT DETECTED Final   Listeria monocytogenes NOT DETECTED NOT DETECTED Final   Staphylococcus species NOT DETECTED NOT DETECTED Final   Staphylococcus aureus (BCID) NOT DETECTED NOT DETECTED Final   Staphylococcus epidermidis NOT DETECTED NOT DETECTED Final   Staphylococcus lugdunensis NOT DETECTED NOT DETECTED Final   Streptococcus species NOT DETECTED NOT DETECTED Final   Streptococcus agalactiae NOT DETECTED NOT DETECTED Final   Streptococcus pneumoniae NOT DETECTED NOT DETECTED Final   Streptococcus pyogenes NOT DETECTED NOT DETECTED Final   A.calcoaceticus-baumannii NOT DETECTED NOT DETECTED Final   Bacteroides fragilis NOT DETECTED NOT DETECTED Final   Enterobacterales DETECTED (A) NOT DETECTED Final    Comment: Enterobacterales represent a large order of gram negative bacteria, not a single organism. CRITICAL RESULT CALLED TO, READ BACK BY AND VERIFIED WITH: PHARMD MELISSA M. 2002 [id number] FCP    Enterobacter cloacae complex NOT DETECTED NOT DETECTED Final   Escherichia coli DETECTED (A) NOT DETECTED Final    Comment: CRITICAL RESULT CALLED TO, READ BACK BY AND VERIFIED WITH: PHARMD MELISSA M. 2002 [id number] FCP    Klebsiella aerogenes NOT DETECTED NOT DETECTED Final   Klebsiella oxytoca NOT DETECTED NOT DETECTED Final   Klebsiella pneumoniae NOT DETECTED NOT DETECTED Final   Proteus species NOT DETECTED NOT DETECTED Final   Salmonella species NOT DETECTED NOT DETECTED Final   Serratia marcescens NOT DETECTED NOT DETECTED Final   Haemophilus influenzae NOT DETECTED NOT DETECTED Final   Neisseria meningitidis NOT DETECTED NOT DETECTED Final   Pseudomonas aeruginosa NOT  DETECTED NOT DETECTED Final   Stenotrophomonas maltophilia NOT DETECTED NOT DETECTED Final   Candida albicans NOT DETECTED NOT DETECTED Final   Candida auris NOT DETECTED NOT DETECTED Final   Candida glabrata NOT DETECTED NOT DETECTED Final   Candida krusei NOT DETECTED NOT DETECTED Final   Candida parapsilosis NOT DETECTED NOT DETECTED Final   Candida tropicalis NOT DETECTED NOT DETECTED Final   Cryptococcus neoformans/gattii NOT DETECTED NOT DETECTED Final   CTX-M ESBL NOT DETECTED NOT DETECTED Final   Carbapenem resistance IMP NOT DETECTED  NOT DETECTED Final   Carbapenem resistance KPC NOT DETECTED NOT DETECTED Final   Carbapenem resistance NDM NOT DETECTED NOT DETECTED Final   Carbapenem resist OXA 48 LIKE NOT DETECTED NOT DETECTED Final   Vancomycin resistance NOT DETECTED NOT DETECTED Final   Carbapenem resistance VIM NOT DETECTED NOT DETECTED Final    Comment: Performed at [facility], [address] [place], [place] [address]  Culture, blood (single)     Status: None (Preliminary result)   Collection Time: 12/29/20  6:50 PM   Specimen: A-Line; Blood  Result Value Ref Range Status   Specimen Description A-LINE CENTRAL LINE  Final   Special Requests   Final    Blood Culture results may not be optimal due to an excessive volume of blood received in culture bottles BOTTLES DRAWN AEROBIC AND ANAEROBIC   Culture   Final    NO GROWTH 2 DAYS Performed at [facility], [address], [place], [place] [address]    Report Status PENDING  Incomplete  MRSA PCR Screening     Status: None   Collection Time: 12/29/20 11:36 PM   Specimen: Nasopharyngeal  Result Value Ref Range Status   MRSA by PCR NEGATIVE NEGATIVE Final    Comment:        The GeneXpert MRSA Assay (FDA approved for NASAL specimens only), is one component of a comprehensive MRSA colonization surveillance program. It is not intended to diagnose MRSA infection nor to guide or monitor treatment for MRSA  infections. Performed at [facility], [address] [place], [place] [address]      Serology:    Imaging: If present, new imagings (plain films, ct scans, and mri) have been personally visualized and interpreted; radiology reports have been reviewed. Decision making incorporated into the Impression / Recommendations.  4/05 cxr 1.  Right IJ line stable position.  2. Cardiomegaly. Diffuse bilateral pulmonary infiltrates/edema and small left pleural effusion noted on today's exam. Findings suggest CHF.  4/03 cta chest abd pelv 1. Motion degraded study. 2. Malpositioned Foley catheter bulb, currently CT weighted in the membranous part of the urethra. There are adjacent pockets of gas and free fluid concerning for a traumatic urethral injury. Repositioning is recommended. Urology consultation should be considered. 3. No evidence for an aortic dissection.  No acute abnormality. 4. Chronic findings as detailed above, not substantially changed from recent prior studies.  Raymondo Band, MD [facility] [phone] pager    12/31/2020, 3:20 PM

## 2020-12-31 NOTE — Progress Notes (Signed)
Pharmacy Antibiotic Note  Vincent Anthony is a 50 y.o. male admitted on 12/29/2020 with urosepsis.  Pharmacy has been consulted for Vancomycin & Cefepime dosing. 12/31/2020  Tm 101.4, PCT > 150 x 2; WBC 40 (on solucortef), lactate 6>5.2>2.6;  SCr down to 1.35, CrCl ~ 78 ml/min; Levo down to 5 mcg/min, vasopressin @ 0.03 units/min 1/4 bottles Blood cultures with Enterococcus faecalis & E Coli- no resistance detected  Plan: Consider changing to ampicillin 2 gm q4 & ceftriaxone 2 gm q24 to cover Enterococcus faecalis & E coli Continue Cefepime 2gm IV q8h Change Vancomycin to 1250 mg IV q12h, AUC 507.9 with SCr 1.35 Check Vancomycin levels at steady state Monitor renal function and cx data   Height: 6\' 3"  (190.5 cm) Weight: 98.6 kg (217 lb 6 oz) IBW/kg (Calculated) : 84.5  Temp (24hrs), Avg:99.7 F (37.6 C), Min:99 F (37.2 C), Max:101.4 F (38.6 C)  Recent Labs  Lab 12/29/20 1635 12/29/20 1905 12/29/20 2020 12/30/20 0248 12/30/20 1135 12/31/20 0343 12/31/20 0555  WBC 4.0  --   --  38.3*  --  40.0*  --   CREATININE 1.50*  --   --  1.86*  --  1.35*  --   LATICACIDVEN 5.7* 5.6* 6.0* 4.7* 5.3*  --  2.6*    Estimated Creatinine Clearance: 78.2 mL/min (A) (by C-G formula based on SCr of 1.35 mg/dL (H)).    Allergies  Allergen Reactions  . Celebrex [Celecoxib] Nausea And Vomiting  . Vioxx [Rofecoxib] Nausea And Vomiting  . Zanaflex [Tizanidine Hcl] Nausea And Vomiting  . Clindamycin Diarrhea  Antimicrobials this admission: 4/3 Cefepime >>  4/3 Vancomycin >> 4/3 Flagyl x1  Dose adjustments this admission: 4/5 Vanc 2 gm q24> 1250 q12  Microbiology results: 4/3 BCx2: 1/4 anaerobic bottle enterococcus faecalis & E coli 4/3 UCx:  sent 4/3 MRSA PCR: negative  Thank you for allowing pharmacy to be a part of this patient's care.  6/3, Pharm.D 12/31/2020 8:22 AM

## 2021-01-01 DIAGNOSIS — R6521 Severe sepsis with septic shock: Secondary | ICD-10-CM | POA: Diagnosis not present

## 2021-01-01 DIAGNOSIS — R7881 Bacteremia: Secondary | ICD-10-CM | POA: Diagnosis not present

## 2021-01-01 DIAGNOSIS — D849 Immunodeficiency, unspecified: Secondary | ICD-10-CM | POA: Diagnosis not present

## 2021-01-01 DIAGNOSIS — A419 Sepsis, unspecified organism: Secondary | ICD-10-CM | POA: Diagnosis not present

## 2021-01-01 LAB — GLUCOSE, CAPILLARY
Glucose-Capillary: 112 mg/dL — ABNORMAL HIGH (ref 70–99)
Glucose-Capillary: 113 mg/dL — ABNORMAL HIGH (ref 70–99)
Glucose-Capillary: 120 mg/dL — ABNORMAL HIGH (ref 70–99)
Glucose-Capillary: 121 mg/dL — ABNORMAL HIGH (ref 70–99)
Glucose-Capillary: 135 mg/dL — ABNORMAL HIGH (ref 70–99)
Glucose-Capillary: 149 mg/dL — ABNORMAL HIGH (ref 70–99)
Glucose-Capillary: 162 mg/dL — ABNORMAL HIGH (ref 70–99)

## 2021-01-01 LAB — BASIC METABOLIC PANEL
Anion gap: 8 (ref 5–15)
BUN: 19 mg/dL (ref 6–20)
CO2: 21 mmol/L — ABNORMAL LOW (ref 22–32)
Calcium: 7.5 mg/dL — ABNORMAL LOW (ref 8.9–10.3)
Chloride: 104 mmol/L (ref 98–111)
Creatinine, Ser: 1.03 mg/dL (ref 0.61–1.24)
GFR, Estimated: >60 mL/min (ref >60)
Glucose, Bld: 157 mg/dL — ABNORMAL HIGH (ref 70–99)
Potassium: 3 mmol/L — ABNORMAL LOW (ref 3.5–5.1)
Sodium: 133 mmol/L — ABNORMAL LOW (ref 135–145)

## 2021-01-01 LAB — CBC
HCT: 28.1 % — ABNORMAL LOW (ref 39.0–52.0)
Hemoglobin: 9.6 g/dL — ABNORMAL LOW (ref 13.0–17.0)
MCH: 27.9 pg (ref 26.0–34.0)
MCHC: 34.2 g/dL (ref 30.0–36.0)
MCV: 81.7 fL (ref 80.0–100.0)
Platelets: 64 10*3/uL — ABNORMAL LOW (ref 150–400)
RBC: 3.44 MIL/uL — ABNORMAL LOW (ref 4.22–5.81)
RDW: 17.2 % — ABNORMAL HIGH (ref 11.5–15.5)
WBC: 48.7 10*3/uL — ABNORMAL HIGH (ref 4.0–10.5)
nRBC: 0 % (ref 0.0–0.2)

## 2021-01-01 LAB — PROCALCITONIN: Procalcitonin: 107.77 ng/mL

## 2021-01-01 MED ORDER — HYOSCYAMINE SULFATE ER 0.375 MG PO TB12
0.3750 mg | ORAL_TABLET | Freq: Two times a day (BID) | ORAL | Status: DC
  Administered 2021-01-01 – 2021-01-06 (×12): 0.375 mg via ORAL
  Filled 2021-01-01 (×12): qty 1

## 2021-01-01 MED ORDER — LEVETIRACETAM 250 MG PO TABS
250.0000 mg | ORAL_TABLET | Freq: Two times a day (BID) | ORAL | Status: DC
  Administered 2021-01-01 – 2021-01-06 (×12): 250 mg via ORAL
  Filled 2021-01-01 (×12): qty 1

## 2021-01-01 MED ORDER — VITAMIN D3 25 MCG (1000 UNIT) PO TABS
2000.0000 [IU] | ORAL_TABLET | Freq: Every day | ORAL | Status: DC
  Administered 2021-01-01 – 2021-01-06 (×6): 2000 [IU] via ORAL
  Filled 2021-01-01 (×6): qty 2

## 2021-01-01 MED ORDER — CEFAZOLIN SODIUM-DEXTROSE 2-4 GM/100ML-% IV SOLN
2.0000 g | Freq: Three times a day (TID) | INTRAVENOUS | Status: DC
  Filled 2021-01-01: qty 100

## 2021-01-01 MED ORDER — VANCOMYCIN HCL 1500 MG/300ML IV SOLN
1500.0000 mg | Freq: Two times a day (BID) | INTRAVENOUS | Status: DC
  Administered 2021-01-01: 1500 mg via INTRAVENOUS
  Filled 2021-01-01: qty 300

## 2021-01-01 MED ORDER — SENNA 8.6 MG PO TABS
2.0000 | ORAL_TABLET | Freq: Two times a day (BID) | ORAL | Status: DC
  Administered 2021-01-01 – 2021-01-05 (×6): 17.2 mg via ORAL
  Filled 2021-01-01 (×11): qty 2

## 2021-01-01 MED ORDER — SODIUM CHLORIDE 0.9 % IV SOLN
3.0000 g | Freq: Four times a day (QID) | INTRAVENOUS | Status: DC
  Administered 2021-01-01 – 2021-01-02 (×3): 3 g via INTRAVENOUS
  Filled 2021-01-01 (×5): qty 8

## 2021-01-01 MED ORDER — POTASSIUM CHLORIDE CRYS ER 20 MEQ PO TBCR
40.0000 meq | EXTENDED_RELEASE_TABLET | Freq: Once | ORAL | Status: AC
  Administered 2021-01-01: 40 meq via ORAL
  Filled 2021-01-01: qty 2

## 2021-01-01 MED ORDER — POTASSIUM CHLORIDE CRYS ER 20 MEQ PO TBCR
20.0000 meq | EXTENDED_RELEASE_TABLET | ORAL | Status: AC
Start: 2021-01-01 — End: 2021-01-01
  Administered 2021-01-01 (×2): 20 meq via ORAL
  Filled 2021-01-01 (×2): qty 1

## 2021-01-01 MED ORDER — POTASSIUM CHLORIDE 10 MEQ/50ML IV SOLN
10.0000 meq | INTRAVENOUS | Status: AC
  Administered 2021-01-01 (×4): 10 meq via INTRAVENOUS
  Filled 2021-01-01 (×4): qty 50

## 2021-01-01 NOTE — Progress Notes (Signed)
Pharmacy Antibiotic Note  Vincent Anthony is a 50 y.o. male admitted on 12/29/2020 with urosepsis.  Pharmacy has been consulted for Vancomycin & Cefepime dosing. 01/01/2021  Tm 98.8, PCT > 150>> 107.7; WBC 48.7 (on solucortef), lactate 6>5.2>2.6;  SCr down to 1.03, CrCl ~ 102 ml/min; Levo down to 3 mcg/min,  Off vasopressin  1/4 bottles Blood cultures with Enterococcus faecalis & E Coli-intermediate to Amp, R to septra  Plan: Consider changing to ampicillin 2 gm q4 & ceftriaxone 2 gm q24 to cover Enterococcus faecalis & E coli Continue Cefepime 2gm IV q8h Change Vancomycin to 1500 mg IV q12h, AUC 472  with SCr 1.03 Check Vancomycin levels at steady state Monitor renal function and cx data  F/u ID recs  Height: 6\' 3"  (190.5 cm) Weight: 98.6 kg (217 lb 6 oz) IBW/kg (Calculated) : 84.5  Temp (24hrs), Avg:98.3 F (36.8 C), Min:98 F (36.7 C), Max:98.8 F (37.1 C)  Recent Labs  Lab 12/29/20 1635 12/29/20 1905 12/29/20 2020 12/30/20 0248 12/30/20 1135 12/31/20 0343 12/31/20 0555 01/01/21 0300  WBC 4.0  --   --  38.3*  --  40.0*  --  48.7*  CREATININE 1.50*  --   --  1.86*  --  1.35*  --  1.03  LATICACIDVEN 5.7* 5.6* 6.0* 4.7* 5.3*  --  2.6*  --     Estimated Creatinine Clearance: 102.5 mL/min (by C-G formula based on SCr of 1.03 mg/dL).    Allergies  Allergen Reactions  . Celebrex [Celecoxib] Nausea And Vomiting  . Vioxx [Rofecoxib] Nausea And Vomiting  . Zanaflex [Tizanidine Hcl] Nausea And Vomiting  . Clindamycin Diarrhea  Antimicrobials this admission: 4/3 Cefepime >>  4/3 Vancomycin >> 4/3 Flagyl x1  Dose adjustments this admission: 4/5 Vanc 2 gm q24> 1250 q12  Microbiology results: 4/3 BCx2: 1/4 anaerobic bottle enterococcus faecalis & E coli- intermed to Amp, R septra 4/3 UCx:  NGF 4/3 MRSA PCR: negative 4/5 BCx2: sent  Thank you for allowing pharmacy to be a part of this patient's care.  6/3, Pharm.D 01/01/2021 9:58 AM

## 2021-01-01 NOTE — Progress Notes (Addendum)
NAME:  Vincent Anthony, MRN:  [id number], DOB:  Nov 04, 1970, LOS: 3 ADMISSION DATE:  12/29/2020, CONSULTATION DATE:  12/30/20 REFERRING MD:  [facility], CHIEF COMPLAINT:    History of Present Illness:  50 y/o M with MS, overactive bladder with indwelling foley for 1 month who [facility] after a traumatic foley exchange attempt with hematuria. The catheter was placed on 3/3, exchange attempted on 4/3 with no urinary output.  After removal, he developed profuse bleeding. CBI attempted, but unable to flush catheter.  Additional catheter attempted in the ER.  While in ER, he developed fever to 102.6, dyspnea, hypotension, tachycardia and shivering. He had a brief loss of consciousness in the ER - questionable seizure vs rigors/hypotension from sepsis, blood loss.  He was transfused 1 unit PRBC, started on cefepime + vancomycin, and empiric keppra.  He was transferred to [facility] and evaluated by Urology (they note concerns for balloon inflation in the urethra). He required levophed + vasopressin for hypotension, 5L IVF and hydrocortisone.   Pertinent  Medical History  MS - baseline mostly bed bound, OOB with assist, requires feeding assist Arthritis Transaminitis GERD Tremor?  Neurogenic bladder COVID - 11/17/20  Significant Hospital Events: Including procedures, antibiotic start and stop dates in addition to other pertinent events    4/03 Admit with hematuria, malpositioned foley, septic shock  4/04 On levophed, vasopressin, urine less bloody. Cultures pending. Cefepime + Vanco  4/05 BC growing E-Coli & GPC, urine culture pending. Keppra stopped. Levo at 5, vaso. HR improved.  4/06 Soft pressures overnight during sleep, levophed restarted. Blood cultures with e-coli, GPC's   Interim History / Subjective:  Afebrile 98.8 tmax / WBC 48.7 On RA Glucose range 112-162  I/O 1.2L UOP, - in last 24 hours   Objective   Blood pressure (!) 98/48, pulse 92,  temperature 98.8 F (37.1 C), temperature source Oral, resp. rate 15, height 6\' 3"  (1.905 m), weight 98.6 kg, SpO2 94 %.        Intake/Output Summary (Last 24 hours) at 01/01/2021 0836 Last data filed at 01/01/2021 03/03/2021 Gross per 24 hour  Intake 3474.46 ml  Output 5375 ml  Net -1900.54 ml   Filed Weights   12/29/20 1827 12/30/20 0500  Weight: 99.8 kg 98.6 kg    Examination: General: chronically ill appearing adult male lying in bed in NAD HEENT: MM pink/moist, on RA, anicteric, R IJ TLC  Neuro: AAOx4, speech clear, MAE CV: s1s2 RRR, SR on monitor, no m/r/g PULM: non-labored on RA, lungs bilaterally clear GI: soft, bsx4 active  Extremities: warm/dry, trace dependent edema  Skin: no rashes or lesions      Resolved Hospital Problem list   Lactic Acidosis   Assessment & Plan:   Septic Shock secondary to E-COLI + possible GPC Bacteremia Suspect possible element of sepsis + blood loss with hematuria & relative AI with chronic steroid use.  Hx E-Coli UTI 07/2020.  Transfused 2 unit.   -appreciate ID -continue cefepime / vancomycin, narrow once sensitivities returned  -follow repeat blood cultures from 4/5 -LR at 50 ml/hr -wean vasopressors for MAP >65 -defer TEE for now -PCT improved -continue stress dose steroids, note was on 40 mg prednisone QD at home  Hematuria secondary to Malpositioned Foley  Required Urology placement of current foley. No hydronephrosis on CT.  -monitor I/O  -flush foley PRN per Urology recommendations -appreciate Urology > will need outpatient f/u with primary urology at discharge, ok for  facility RN to change catheter but need to be aware of 21ml balloon  Urinary Retention  -continue ditropan  AKI  -Trend BMP / urinary output -Replace electrolytes as indicated, KCL 40 mEq IV + KCL PO -Avoid nephrotoxic agents, ensure adequate renal perfusion  Questionable Seizure vs Rigors from Fever  Based on hx and patient description, suspect this was  rigors with fever to 102.6 and hypotension / AMS. He does not have a hx of seizure, but has been on keppra long term (?if this is for adjuvant pain control).  Wife denies hx of seizures.  -resume home keppra given many years of use   Anemia -trend CBC -transfuse for Hgb <7% or active bleeding  DM  -SSI -hold home metformin   MS  -continue lyrica, baclofen  -PT efforts as able   HLD  -continue pravachol    Constipation  -continue home linzess  Anxiety  -PRN ativan  Best practice (right click and "Reselect all SmartList Selections" daily)  Diet:  Oral Pain/Anxiety/Delirium protocol (if indicated): No VAP protocol (if indicated): Not indicated DVT prophylaxis: SCDs, no heparin given bleeding GI prophylaxis: PPI Glucose control:  SSI Yes Central venous access:  Yes still needed Arterial line:  Yes, and it is no longer needed Foley:  Yes, and it is still needed Mobility:  bed rest  PT consulted: N/A Last date of multidisciplinary goals of care discussion:  Wife updated 4/6 via phone on patients status Code Status:  full code Disposition: ICU   Critical care time: 33 minutes    Canary Brim, MSN, APRN, NP-C, AGACNP-BC [facility] 01/01/2021, 8:36 AM   Please see [email or web address] for pager details.   From 7A-7P if no response, please call [phone] After hours, please call ELink [phone]

## 2021-01-01 NOTE — Progress Notes (Signed)
K+ 3.0 °Replaced per protocol ° °

## 2021-01-01 NOTE — Progress Notes (Signed)
[facility]  Date of Admission:  12/29/2020     CC: Septic shock Polymicrobial bacteremia, including enterococcus   Lines:  4/03-c right ij triple lumen 4/03-c left radial a-line  Urethral catheter   Abx: 4/03-c cefepime 4/03-c vancomycin  4/03 metronidazole                                                      Assessment: Severe sepsis/septic shock Polymicrobial bacteremia including e faecalis aki improving immunosuppression  50 y.o. 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 No pertinent OI to worry at this time clinically.   -------------- 4/06 assessment Patient continues to clinically look better He has still a rising leukemoid reaction and also new thrombocytopenia. There is no differential. He has been on pressors (which now only is levophed down from 2 pressors yesterday). He only is on stress dose hydrocortisone. There is no diarrhea. There is no abscess/pyelo imaging evidence on ct abd/pelv  I am not sure what to make of the leukemoid reaction yet but doesn't appear related to his infection. Will need to review his peripheral blood smear and differential  Original bcx grew non-esbl ecoli and e faecalis. Repeat bcx is negative   Plan: 1. Can change abx to amp-sulbactam from vanc/cefepime 2. Check differential with cbc and peripheral blood smear tomorrow 3.   Duration for  enterococcus is likely 2 weeks, and for ecoli is 10 days 4.   Will check on e faecalis for linezolid susceptibility   I spent more than 35 minute reviewing data/chart, and coordinating care and >50% direct face to face time providing counseling/discussing diagnostics/treatment plan with patient  ---------------- Active Problems:   Septic shock (HCC)   Bacteremia   Immunosuppressed status (HCC)   Allergies  Allergen Reactions  . Celebrex [Celecoxib] Nausea And Vomiting  . Vioxx [Rofecoxib] Nausea And Vomiting  . Zanaflex [Tizanidine Hcl] Nausea And Vomiting  . Clindamycin Diarrhea    Scheduled Meds: . baclofen  10 mg Oral TID  . chlorhexidine  15 mL Mouth Rinse BID  . Chlorhexidine Gluconate Cloth  6 each Topical Daily  . cholecalciferol  2,000 Units Oral Daily  . hydrocortisone sod succinate (SOLU-CORTEF) inj  50 mg Intravenous Q6H  . hyoscyamine  0.375 mg Oral Q12H  . insulin aspart  0-15 Units Subcutaneous Q4H  . levETIRAcetam  250 mg Oral BID  . linaclotide  145 mcg Oral QAC breakfast  . mouth rinse  15 mL Mouth Rinse q12n4p  . oxybutynin  10 mg Oral QHS  . pantoprazole  40 mg Oral QHS  . pravastatin  20 mg Oral q1800  . pregabalin  100 mg Oral BID  . senna  2 tablet Oral BID   Continuous Infusions: . ampicillin-sulbactam (UNASYN) IV 3 g (01/01/21 1425)  . lactated ringers 50 mL/hr  at 01/01/21 0854  . norepinephrine (LEVOPHED) Adult infusion 4 mcg/min (01/01/21 0854)   PRN Meds:.acetaminophen, docusate sodium, ondansetron (ZOFRAN) IV, polyethylene glycol   SUBJECTIVE: Feels much better No diarrhea No rash, n/v, joint pain, headache Still on pressor but down to levophed only; vasopressin off On stress dose hydrocortisone still Wbc elevation is very high, thrombocytopenia worse   Review of Systems: ROS All other ROS was negative, except mentioned above     OBJECTIVE: Vitals:   01/01/21 0900 01/01/21 1000 01/01/21 1100 01/01/21 1200  BP: (!) 103/50 (!)  105/55 105/81   Pulse: (!) 104 (!) 101 89 79  Resp: (!) 29 (!) 25 14 (!) 25  Temp:    98.9 F (37.2 C)  TempSrc:    Oral  SpO2: 96% 95% 96% 100%  Weight:      Height:       Body mass index is 27.17 kg/m.  Physical Exam General/constitutional: no distress, pleasant HEENT: Normocephalic, PER, Conj Clear, EOMI, Oropharynx clear Neck supple CV: rrr no mrg Lungs: clear to auscultation, normal respiratory effort Abd: Soft, Nontender Ext: no edema Skin: No Rash Neuro: spastic paresis stable MSK: no peripheral joint swelling/tenderness/warmth; back spines nontender   Central line presence: no   Lab Results Lab Results  Component Value Date   WBC 48.7 (H) 01/01/2021   HGB 9.6 (L) 01/01/2021   HCT 28.1 (L) 01/01/2021   MCV 81.7 01/01/2021   PLT 64 (L) 01/01/2021    Lab Results  Component Value Date   CREATININE 1.03 01/01/2021   BUN 19 01/01/2021   NA 133 (L) 01/01/2021   K 3.0 (L) 01/01/2021   CL 104 01/01/2021   CO2 21 (L) 01/01/2021    Lab Results  Component Value Date   ALT 38 12/29/2020   AST 25 12/29/2020   ALKPHOS 96 12/29/2020   BILITOT 0.6 12/29/2020      Microbiology: Recent Results (from the past 240 hour(s))  Urine culture     Status: None   Collection Time: 12/29/20  4:34 PM   Specimen: In/Out Cath Urine  Result Value Ref Range Status   Specimen Description   Final    IN/OUT CATH URINE Performed at [facility], [address] [place], [place] [address]    Special Requests   Final    NONE Performed at [facility], [address], [place], [place] [address]    Culture   Final    NO GROWTH Performed at [facility], [address], [place], [place] [address]    Report Status 12/31/2020 FINAL  Final  Blood culture (routine single)     Status: Abnormal (Preliminary result)   Collection Time: 12/29/20  4:35 PM   Specimen: Left Antecubital; Blood  Result Value Ref Range Status   Specimen Description   Final    LEFT ANTECUBITAL BOTTLES  DRAWN AEROBIC AND ANAEROBIC Performed at [facility], [address] [place], [place] [address]    Special Requests   Final    Blood Culture adequate volume Performed at [facility], [address] [place], [place] [address]    Culture  Setup Time   Final    GRAM NEGATIVE RODS ANAEROBIC BOTTLE Gram Stain Report Called to,Read Back By and Verified With: HEAVNER,A@1211  BY MATTHEWS,B 4.4.22 PT @[facility]   Organism ID to follow GRAM POSITIVE COCCI ANAEROBIC BOTTLE ONLY CRITICAL RESULT CALLED TO, READ BACK BY AND VERIFIED WITH: PHARMD MELISSA M. 2002 [id number] FCP    Culture (A)  Final    ESCHERICHIA COLI ENTEROCOCCUS FAECALIS SUSCEPTIBILITIES TO FOLLOW Performed at [facility], [address], [place], [place] [address]    Report Status PENDING  Incomplete   Organism ID, Bacteria ESCHERICHIA COLI  Final      Susceptibility   Escherichia coli - MIC*    AMPICILLIN 16 INTERMEDIATE Intermediate     CEFAZOLIN <=4 SENSITIVE Sensitive     CEFEPIME <=0.12 SENSITIVE Sensitive     CEFTAZIDIME <=1 SENSITIVE Sensitive     CEFTRIAXONE <=0.25 SENSITIVE Sensitive     CIPROFLOXACIN <=0.25 SENSITIVE Sensitive     GENTAMICIN <=1 SENSITIVE Sensitive     IMIPENEM <=0.25 SENSITIVE Sensitive     TRIMETH/SULFA >=320 RESISTANT Resistant     AMPICILLIN/SULBACTAM 4 SENSITIVE Sensitive     PIP/TAZO <=4 SENSITIVE Sensitive     * ESCHERICHIA COLI  Blood Culture ID Panel (Reflexed)     Status: Abnormal   Collection Time: 12/29/20  4:35 PM  Result Value Ref Range Status   Enterococcus faecalis DETECTED (A) NOT DETECTED Final    Comment: CRITICAL RESULT CALLED TO, READ BACK BY AND VERIFIED WITH: PHARMD MELISSA M. 2002 [id number] FCP    Enterococcus Faecium NOT DETECTED NOT DETECTED Final   Listeria monocytogenes NOT DETECTED NOT DETECTED Final   Staphylococcus species NOT DETECTED NOT DETECTED Final   Staphylococcus aureus (BCID) NOT DETECTED NOT DETECTED Final   Staphylococcus epidermidis NOT DETECTED NOT  DETECTED Final   Staphylococcus lugdunensis NOT DETECTED NOT DETECTED Final   Streptococcus species NOT DETECTED NOT DETECTED Final   Streptococcus agalactiae NOT DETECTED NOT DETECTED Final   Streptococcus pneumoniae NOT DETECTED NOT DETECTED Final   Streptococcus pyogenes NOT DETECTED NOT DETECTED Final   A.calcoaceticus-baumannii NOT DETECTED NOT DETECTED Final   Bacteroides fragilis NOT DETECTED NOT DETECTED Final   Enterobacterales DETECTED (A) NOT DETECTED Final    Comment: Enterobacterales represent a large order of gram negative bacteria, not a single organism. CRITICAL RESULT CALLED TO, READ BACK BY AND VERIFIED WITH: PHARMD MELISSA M. 2002 [id number] FCP    Enterobacter cloacae complex NOT DETECTED NOT DETECTED Final   Escherichia coli DETECTED (A) NOT DETECTED Final    Comment: CRITICAL RESULT CALLED TO, READ BACK BY AND VERIFIED WITH: PHARMD MELISSA M. 2002 [id number] FCP    Klebsiella aerogenes NOT DETECTED NOT DETECTED Final   Klebsiella oxytoca NOT DETECTED NOT DETECTED Final   Klebsiella pneumoniae NOT DETECTED NOT DETECTED Final   Proteus species NOT DETECTED NOT DETECTED Final   Salmonella species NOT DETECTED NOT DETECTED Final   Serratia marcescens NOT DETECTED NOT DETECTED Final   Haemophilus influenzae NOT DETECTED NOT DETECTED Final   Neisseria meningitidis NOT DETECTED NOT DETECTED Final   Pseudomonas aeruginosa NOT DETECTED NOT DETECTED Final   Stenotrophomonas maltophilia NOT DETECTED NOT DETECTED Final   Candida albicans NOT DETECTED NOT DETECTED Final   Candida auris NOT DETECTED NOT DETECTED Final   Candida glabrata NOT DETECTED NOT DETECTED Final   Candida krusei NOT DETECTED NOT DETECTED Final   Candida parapsilosis NOT DETECTED NOT DETECTED Final   Candida tropicalis NOT DETECTED NOT DETECTED Final   Cryptococcus neoformans/gattii NOT DETECTED NOT DETECTED Final   CTX-M ESBL NOT DETECTED NOT DETECTED Final   Carbapenem resistance IMP NOT DETECTED NOT  DETECTED Final   Carbapenem resistance KPC NOT DETECTED NOT DETECTED Final   Carbapenem resistance NDM NOT DETECTED NOT DETECTED Final   Carbapenem resist OXA 48 LIKE NOT DETECTED NOT DETECTED Final  Vancomycin resistance NOT DETECTED NOT DETECTED Final   Carbapenem resistance VIM NOT DETECTED NOT DETECTED Final    Comment: Performed at [facility], [address], [place], [place] [address]  Culture, blood (single)     Status: None (Preliminary result)   Collection Time: 12/29/20  6:50 PM   Specimen: A-Line; Blood  Result Value Ref Range Status   Specimen Description A-LINE CENTRAL LINE  Final   Special Requests   Final    Blood Culture results may not be optimal due to an excessive volume of blood received in culture bottles BOTTLES DRAWN AEROBIC AND ANAEROBIC   Culture   Final    NO GROWTH 3 DAYS Performed at [facility], [address] [place], [place] [address]    Report Status PENDING  Incomplete  MRSA PCR Screening     Status: None   Collection Time: 12/29/20 11:36 PM   Specimen: Nasopharyngeal  Result Value Ref Range Status   MRSA by PCR NEGATIVE NEGATIVE Final    Comment:        The GeneXpert MRSA Assay (FDA approved for NASAL specimens only), is one component of a comprehensive MRSA colonization surveillance program. It is not intended to diagnose MRSA infection nor to guide or monitor treatment for MRSA infections. Performed at [facility], [address], [place], [place] [address]      Serology:   Imaging: If present, new imagings (plain films, ct scans, and mri) have been personally visualized and interpreted; radiology reports have been reviewed. Decision making incorporated into the Impression / Recommendations.  4/05 cxr 1. Right IJ line stable position. 2. Cardiomegaly. Diffuse bilateral pulmonary infiltrates/edema and small left pleural effusion noted on today's exam. Findings suggest CHF.  4/03 cta chest abd  pelv 1. Motion degraded study. 2. Malpositioned Foley catheter bulb, currently CT weighted in the membranous part of the urethra. There are adjacent pockets of gas and free fluid concerning for a traumatic urethral injury. Repositioning is recommended. Urology consultation should be considered. 3. No evidence for an aortic dissection. No acute abnormality. 4. Chronic findings as detailed above, not substantially changed from recent prior studies.   Raymondo Band, MD [facility] [phone] pager    01/01/2021, 3:08 PM

## 2021-01-02 ENCOUNTER — Inpatient Hospital Stay (HOSPITAL_COMMUNITY): Payer: Medicare HMO

## 2021-01-02 DIAGNOSIS — R7881 Bacteremia: Secondary | ICD-10-CM | POA: Diagnosis not present

## 2021-01-02 DIAGNOSIS — A4181 Sepsis due to Enterococcus: Secondary | ICD-10-CM | POA: Diagnosis not present

## 2021-01-02 DIAGNOSIS — D849 Immunodeficiency, unspecified: Secondary | ICD-10-CM | POA: Diagnosis not present

## 2021-01-02 DIAGNOSIS — N179 Acute kidney failure, unspecified: Secondary | ICD-10-CM | POA: Diagnosis not present

## 2021-01-02 LAB — CBC WITH DIFFERENTIAL/PLATELET
Abs Immature Granulocytes: 2.23 10*3/uL — ABNORMAL HIGH (ref 0.00–0.07)
Basophils Absolute: 0 10*3/uL (ref 0.0–0.1)
Basophils Relative: 0 %
Eosinophils Absolute: 0 10*3/uL (ref 0.0–0.5)
Eosinophils Relative: 0 %
HCT: 28.7 % — ABNORMAL LOW (ref 39.0–52.0)
Hemoglobin: 9.6 g/dL — ABNORMAL LOW (ref 13.0–17.0)
Immature Granulocytes: 4 %
Lymphocytes Relative: 1 %
Lymphs Abs: 0.4 10*3/uL — ABNORMAL LOW (ref 0.7–4.0)
MCH: 27.7 pg (ref 26.0–34.0)
MCHC: 33.4 g/dL (ref 30.0–36.0)
MCV: 82.9 fL (ref 80.0–100.0)
Monocytes Absolute: 2 10*3/uL — ABNORMAL HIGH (ref 0.1–1.0)
Monocytes Relative: 4 %
Neutro Abs: 48.8 10*3/uL — ABNORMAL HIGH (ref 1.7–7.7)
Neutrophils Relative %: 91 %
Platelets: 72 10*3/uL — ABNORMAL LOW (ref 150–400)
RBC: 3.46 MIL/uL — ABNORMAL LOW (ref 4.22–5.81)
RDW: 18 % — ABNORMAL HIGH (ref 11.5–15.5)
WBC: 53.4 10*3/uL (ref 4.0–10.5)
nRBC: 0 % (ref 0.0–0.2)

## 2021-01-02 LAB — COMPREHENSIVE METABOLIC PANEL
ALT: 33 U/L (ref 0–44)
AST: 30 U/L (ref 15–41)
Albumin: 2.2 g/dL — ABNORMAL LOW (ref 3.5–5.0)
Alkaline Phosphatase: 132 U/L — ABNORMAL HIGH (ref 38–126)
Anion gap: 9 (ref 5–15)
BUN: 14 mg/dL (ref 6–20)
CO2: 23 mmol/L (ref 22–32)
Calcium: 8.4 mg/dL — ABNORMAL LOW (ref 8.9–10.3)
Chloride: 114 mmol/L — ABNORMAL HIGH (ref 98–111)
Creatinine, Ser: 1.15 mg/dL (ref 0.61–1.24)
GFR, Estimated: >60 mL/min (ref >60)
Glucose, Bld: 116 mg/dL — ABNORMAL HIGH (ref 70–99)
Potassium: 3.4 mmol/L — ABNORMAL LOW (ref 3.5–5.1)
Sodium: 146 mmol/L — ABNORMAL HIGH (ref 135–145)
Total Bilirubin: 0.7 mg/dL (ref 0.3–1.2)
Total Protein: 4.9 g/dL — ABNORMAL LOW (ref 6.5–8.1)

## 2021-01-02 LAB — CULTURE, BLOOD (SINGLE): Special Requests: ADEQUATE

## 2021-01-02 LAB — GLUCOSE, CAPILLARY
Glucose-Capillary: 113 mg/dL — ABNORMAL HIGH (ref 70–99)
Glucose-Capillary: 84 mg/dL (ref 70–99)
Glucose-Capillary: 76 mg/dL (ref 70–99)
Glucose-Capillary: 89 mg/dL (ref 70–99)
Glucose-Capillary: 105 mg/dL — ABNORMAL HIGH (ref 70–99)

## 2021-01-02 LAB — PATHOLOGIST SMEAR REVIEW

## 2021-01-02 IMAGING — DX DG CHEST 1V PORT
1 series · 1 of 1 positions shown · non-contrast
Comparison: [DATE].

CLINICAL DATA: Bacteremia.

EXAM:
PORTABLE CHEST 1 VIEW

[chest ap]
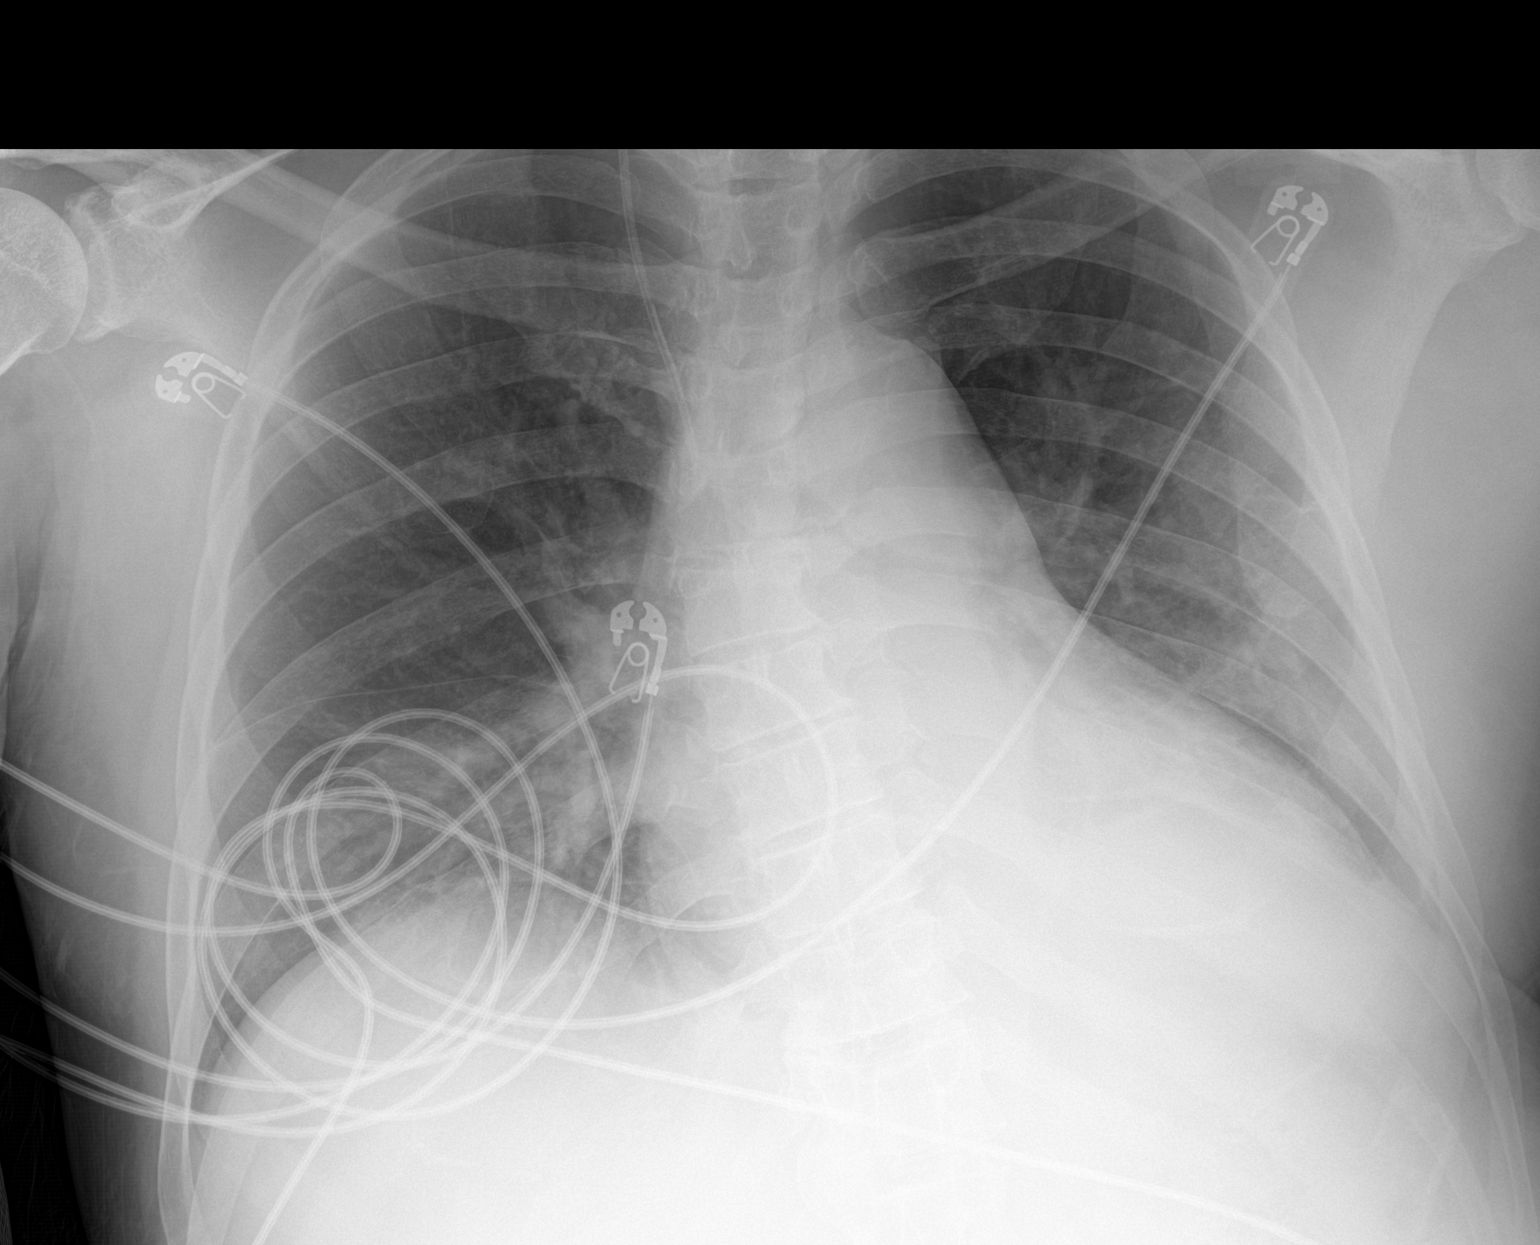

[1 of 1 positions shown; findings below may reference images not displayed]

FINDINGS: Right IJ line in stable position. Stable cardiomegaly. Interim
improvement of bilateral pulmonary infiltrates/edema with mild
residual. Findings suggest improvement of CHF. Improved small left
pleural effusion. No pneumothorax.
IMPRESSION: 1.  Right IJ line in stable position.

2. Stable cardiomegaly. Interim improvement of bilateral pulmonary
infiltrates/edema and left-sided pleural effusion. Mild residual.
Findings suggest improving CHF.

## 2021-01-02 MED ORDER — POTASSIUM CHLORIDE CRYS ER 20 MEQ PO TBCR
40.0000 meq | EXTENDED_RELEASE_TABLET | Freq: Once | ORAL | Status: AC
  Administered 2021-01-02: 40 meq via ORAL
  Filled 2021-01-02: qty 2

## 2021-01-02 MED ORDER — CEPHALEXIN 500 MG PO CAPS
500.0000 mg | ORAL_CAPSULE | Freq: Four times a day (QID) | ORAL | Status: AC
  Administered 2021-01-02 – 2021-01-04 (×10): 500 mg via ORAL
  Filled 2021-01-02 (×10): qty 1

## 2021-01-02 MED ORDER — ARTIFICIAL TEARS OPHTHALMIC OINT
TOPICAL_OINTMENT | OPHTHALMIC | Status: DC | PRN
  Filled 2021-01-02: qty 3.5

## 2021-01-02 MED ORDER — SODIUM CHLORIDE 0.9% FLUSH
10.0000 mL | INTRAVENOUS | Status: DC | PRN
Start: 2021-01-02 — End: 2021-01-07

## 2021-01-02 MED ORDER — LINEZOLID 600 MG PO TABS
600.0000 mg | ORAL_TABLET | Freq: Two times a day (BID) | ORAL | Status: DC
  Administered 2021-01-02 – 2021-01-06 (×10): 600 mg via ORAL
  Filled 2021-01-02 (×10): qty 1

## 2021-01-02 MED ORDER — SODIUM CHLORIDE 0.9% FLUSH
10.0000 mL | Freq: Two times a day (BID) | INTRAVENOUS | Status: DC
  Administered 2021-01-02 – 2021-01-06 (×8): 10 mL

## 2021-01-02 NOTE — Progress Notes (Deleted)
PIV in L hand painful to flush. L arm with mild edema. Discussed with RN, will hold off on IV start for now as pt has no IV meds ordered. Please enter STAT IV consult if meds are needed later.

## 2021-01-02 NOTE — Progress Notes (Signed)
[facility]  Date of Admission:  12/29/2020     CC: Septic shock Polymicrobial bacteremia, including enterococcus   Lines:  4/03-c right ij triple lumen 4/03-c left radial a-line  Urethral catheter   Abx: 4/03-c cefepime 4/03-c vancomycin  4/03 metronidazole                                                      Assessment: Severe sepsis/septic shock Polymicrobial bacteremia including e faecalis aki improving immunosuppression  50 y.o. 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 No pertinent OI to worry at this time clinically.   -------------- 4/07 assessment Patient's sepsis resolved, bacteremia well controlled Wbc up still and mostly neutrophillic with some monocytosis, along with thrombocytopenia (that is improving), unlikely a harbinger of concern in terms of infection. Rather high though for bacteremia that is already well controlled. Path blood smear is pending. Discussed with primary team and will defer to them if they deem hematology evaluation is needed  Reasonable now off pressor and eating well to transition to oral abx   Plan: 1. Stop iv abx 2. Start linezolid 600 mg po bid until 4/17 for total 14 days abx for e faecalis 3. Start cephalexin 500 mg po qid until 4/10 for 7 days for ecoli  4. Id will sign off   ---------------- Active Problems:   Septic shock (HCC)    Bacteremia   Immunosuppressed status (HCC)   Allergies  Allergen Reactions  . Celebrex [Celecoxib] Nausea And Vomiting  . Vioxx [Rofecoxib] Nausea And Vomiting  . Zanaflex [Tizanidine Hcl] Nausea And Vomiting  . Clindamycin Diarrhea    Scheduled Meds: . baclofen  10 mg Oral TID  . chlorhexidine  15 mL Mouth Rinse BID  . Chlorhexidine Gluconate Cloth  6 each Topical Daily  . cholecalciferol  2,000 Units Oral Daily  . hydrocortisone sod succinate (SOLU-CORTEF) inj  50 mg Intravenous Q6H  . hyoscyamine  0.375 mg Oral Q12H  . insulin aspart  0-15 Units Subcutaneous Q4H  . levETIRAcetam  250 mg Oral BID  . linaclotide  145 mcg Oral QAC breakfast  . mouth rinse  15 mL Mouth Rinse q12n4p  . oxybutynin  10 mg Oral QHS  . pantoprazole  40 mg Oral QHS  . pravastatin  20 mg Oral q1800  . pregabalin  100 mg Oral BID  . senna  2 tablet Oral BID  . sodium chloride flush  10-40 mL Intracatheter Q12H   Continuous Infusions: . ampicillin-sulbactam (UNASYN) IV Stopped (01/02/21 0141)  . norepinephrine (LEVOPHED) Adult infusion Stopped (01/02/21 0410)   PRN Meds:.acetaminophen, docusate sodium, ondansetron (ZOFRAN) IV, polyethylene glycol, sodium chloride flush   SUBJECTIVE: At baseline in terms of how he is feeling No event otherwise overnight No n/v/diarrhea/rash Off pressors this morning Wbc still climbing Improving thrombocytopenia  Review of Systems: ROS All other ROS was negative, except mentioned above     OBJECTIVE: Vitals:   01/02/21 0400 01/02/21 0500 01/02/21 0600 01/02/21 0800  BP: 130/66 (!) 99/54 (!) 104/58   Pulse: 69 78 72   Resp: (!) 21 (!) 21 17   Temp: 98.3 F (36.8 C)   98.1 F (36.7 C)  TempSrc: Oral   Oral  SpO2: 96% 96% 95%   Weight:  110.7 kg    Height:       Body mass index is 30.5 kg/m.  Physical Exam General/constitutional: no distress, pleasant HEENT: Normocephalic, PER, Conj Clear, EOMI, Oropharynx clear Neck supple CV: rrr no  mrg Lungs: clear to auscultation, normal respiratory effort Abd: Soft, Nontender Ext: no edema Skin: No Rash Neuro: stable para-paresis MSK: no peripheral joint swelling/tenderness/warmth; back spines nontender   Central line presence: yes right ij; site no purulence/erythema/tenderness    Lab Results Lab Results  Component Value Date   WBC 53.4 (HH) 01/02/2021   HGB 9.6 (L) 01/02/2021   HCT 28.7 (L) 01/02/2021   MCV 82.9 01/02/2021   PLT 72 (L) 01/02/2021    Lab Results  Component Value Date   CREATININE 1.15 01/02/2021   BUN 14 01/02/2021   NA 146 (H) 01/02/2021   K 3.4 (L) 01/02/2021   CL 114 (H) 01/02/2021   CO2 23 01/02/2021    Lab Results  Component Value Date   ALT 33 01/02/2021   AST 30 01/02/2021   ALKPHOS 132 (H) 01/02/2021   BILITOT 0.7 01/02/2021      Microbiology: Recent Results (from the past 240 hour(s))  Urine culture     Status: None   Collection Time: 12/29/20  4:34 PM   Specimen: In/Out Cath Urine  Result Value Ref Range Status   Specimen Description   Final    IN/OUT CATH URINE Performed at [facility], [address], [place], [place] [address]    Special Requests   Final    NONE Performed at [facility], [address], [place], [place] [address]    Culture   Final    NO GROWTH Performed at [facility], [address] [place], [place] [address]    Report Status 12/31/2020 FINAL  Final  Blood culture (routine single)     Status: Abnormal   Collection Time: 12/29/20  4:35 PM   Specimen: Left Antecubital; Blood  Result Value Ref Range Status   Specimen Description   Final    LEFT ANTECUBITAL BOTTLES DRAWN AEROBIC AND ANAEROBIC   Special Requests Blood Culture adequate volume  Final   Culture  Setup Time   Final    GRAM NEGATIVE RODS ANAEROBIC BOTTLE Gram Stain Report Called to,Read Back By and Verified With: HEAVNER,A@1211  BY MATTHEWS,B 4.4.22 PT @[facility]   Organism ID to follow GRAM POSITIVE COCCI ANAEROBIC BOTTLE  ONLY CRITICAL RESULT CALLED TO, READ BACK BY AND VERIFIED WITH: PHARMD MELISSA M. 2002 [id number] FCP    Culture ESCHERICHIA COLI ENTEROCOCCUS FAECALIS  (A)  Final   Report Status 01/02/2021 FINAL  Final   Organism ID, Bacteria ESCHERICHIA COLI  Final   Organism ID, Bacteria ENTEROCOCCUS FAECALIS  Final      Susceptibility   Escherichia coli - MIC*    AMPICILLIN 16 INTERMEDIATE Intermediate     CEFAZOLIN <=4 SENSITIVE Sensitive     CEFEPIME <=0.12 SENSITIVE Sensitive     CEFTAZIDIME <=1 SENSITIVE Sensitive     CEFTRIAXONE <=0.25 SENSITIVE Sensitive  CIPROFLOXACIN <=0.25 SENSITIVE Sensitive     GENTAMICIN <=1 SENSITIVE Sensitive     IMIPENEM <=0.25 SENSITIVE Sensitive     TRIMETH/SULFA >=320 RESISTANT Resistant     AMPICILLIN/SULBACTAM 4 SENSITIVE Sensitive     PIP/TAZO <=4 SENSITIVE Sensitive     * ESCHERICHIA COLI   Enterococcus faecalis - MIC*    AMPICILLIN <=2 SENSITIVE Sensitive     VANCOMYCIN 1 SENSITIVE Sensitive     GENTAMICIN SYNERGY SENSITIVE Sensitive     LINEZOLID Value in next row Sensitive      SENSITIVE2Performed at [facility], [address] [place], [place] [address]    * ENTEROCOCCUS FAECALIS  Blood Culture ID Panel (Reflexed)     Status: Abnormal   Collection Time: 12/29/20  4:35 PM  Result Value Ref Range Status   Enterococcus faecalis DETECTED (A) NOT DETECTED Final    Comment: CRITICAL RESULT CALLED TO, READ BACK BY AND VERIFIED WITH: PHARMD MELISSA M. 2002 [id number] FCP    Enterococcus Faecium NOT DETECTED NOT DETECTED Final   Listeria monocytogenes NOT DETECTED NOT DETECTED Final   Staphylococcus species NOT DETECTED NOT DETECTED Final   Staphylococcus aureus (BCID) NOT DETECTED NOT DETECTED Final   Staphylococcus epidermidis NOT DETECTED NOT DETECTED Final   Staphylococcus lugdunensis NOT DETECTED NOT DETECTED Final   Streptococcus species NOT DETECTED NOT DETECTED Final   Streptococcus agalactiae NOT DETECTED NOT DETECTED Final    Streptococcus pneumoniae NOT DETECTED NOT DETECTED Final   Streptococcus pyogenes NOT DETECTED NOT DETECTED Final   A.calcoaceticus-baumannii NOT DETECTED NOT DETECTED Final   Bacteroides fragilis NOT DETECTED NOT DETECTED Final   Enterobacterales DETECTED (A) NOT DETECTED Final    Comment: Enterobacterales represent a large order of gram negative bacteria, not a single organism. CRITICAL RESULT CALLED TO, READ BACK BY AND VERIFIED WITH: PHARMD MELISSA M. 2002 [id number] FCP    Enterobacter cloacae complex NOT DETECTED NOT DETECTED Final   Escherichia coli DETECTED (A) NOT DETECTED Final    Comment: CRITICAL RESULT CALLED TO, READ BACK BY AND VERIFIED WITH: PHARMD MELISSA M. 2002 [id number] FCP    Klebsiella aerogenes NOT DETECTED NOT DETECTED Final   Klebsiella oxytoca NOT DETECTED NOT DETECTED Final   Klebsiella pneumoniae NOT DETECTED NOT DETECTED Final   Proteus species NOT DETECTED NOT DETECTED Final   Salmonella species NOT DETECTED NOT DETECTED Final   Serratia marcescens NOT DETECTED NOT DETECTED Final   Haemophilus influenzae NOT DETECTED NOT DETECTED Final   Neisseria meningitidis NOT DETECTED NOT DETECTED Final   Pseudomonas aeruginosa NOT DETECTED NOT DETECTED Final   Stenotrophomonas maltophilia NOT DETECTED NOT DETECTED Final   Candida albicans NOT DETECTED NOT DETECTED Final   Candida auris NOT DETECTED NOT DETECTED Final   Candida glabrata NOT DETECTED NOT DETECTED Final   Candida krusei NOT DETECTED NOT DETECTED Final   Candida parapsilosis NOT DETECTED NOT DETECTED Final   Candida tropicalis NOT DETECTED NOT DETECTED Final   Cryptococcus neoformans/gattii NOT DETECTED NOT DETECTED Final   CTX-M ESBL NOT DETECTED NOT DETECTED Final   Carbapenem resistance IMP NOT DETECTED NOT DETECTED Final   Carbapenem resistance KPC NOT DETECTED NOT DETECTED Final   Carbapenem resistance NDM NOT DETECTED NOT DETECTED Final   Carbapenem resist OXA 48 LIKE NOT DETECTED NOT DETECTED Final    Vancomycin resistance NOT DETECTED NOT DETECTED Final   Carbapenem resistance VIM NOT DETECTED NOT DETECTED Final    Comment: Performed at [facility], [address] [place],  [place] [id number]  Culture, blood (single)     Status: None (Preliminary result)   Collection Time: 12/29/20  6:50 PM   Specimen: A-Line; Blood  Result Value Ref Range Status   Specimen Description A-LINE CENTRAL LINE  Final   Special Requests   Final    Blood Culture results may not be optimal due to an excessive volume of blood received in culture bottles BOTTLES DRAWN AEROBIC AND ANAEROBIC   Culture   Final    NO GROWTH 3 DAYS Performed at [facility], [address] [place], [place] [address]    Report Status PENDING  Incomplete  MRSA PCR Screening     Status: None   Collection Time: 12/29/20 11:36 PM   Specimen: Nasopharyngeal  Result Value Ref Range Status   MRSA by PCR NEGATIVE NEGATIVE Final    Comment:        The GeneXpert MRSA Assay (FDA approved for NASAL specimens only), is one component of a comprehensive MRSA colonization surveillance program. It is not intended to diagnose MRSA infection nor to guide or monitor treatment for MRSA infections. Performed at [facility], [address] [place], [place] [address]   Culture, blood (routine x 2)     Status: None (Preliminary result)   Collection Time: 12/31/20  4:01 PM   Specimen: BLOOD  Result Value Ref Range Status   Specimen Description   Final    BLOOD RIGHT HAND Performed at [facility], [address] [place], [place] [address]    Special Requests   Final    BOTTLES DRAWN AEROBIC AND ANAEROBIC Blood Culture adequate volume Performed at [facility], [address] [place], [place] [address]    Culture   Final    NO GROWTH 1 DAY Performed at [facility], [address] [place], [place] [address]    Report Status PENDING  Incomplete  Culture, blood  (routine x 2)     Status: None (Preliminary result)   Collection Time: 12/31/20  4:07 PM   Specimen: BLOOD  Result Value Ref Range Status   Specimen Description   Final    BLOOD RIGHT HAND Performed at [facility], [address] [place], [place] [address]    Special Requests   Final    BOTTLES DRAWN AEROBIC AND ANAEROBIC Blood Culture results may not be optimal due to an inadequate volume of blood received in culture bottles Performed at [facility], [address] [place], [place] [address]    Culture   Final    NO GROWTH 1 DAY Performed at [facility], [address] [place], [place] [address]    Report Status PENDING  Incomplete     Serology:   Imaging: If present, new imagings (plain films, ct scans, and mri) have been personally visualized and interpreted; radiology reports have been reviewed. Decision making incorporated into the Impression / Recommendations.  4/05 cxr 1. Right IJ line stable position. 2. Cardiomegaly. Diffuse bilateral pulmonary infiltrates/edema and small left pleural effusion noted on today's exam. Findings suggest CHF.  4/03 cta chest abd pelv 1. Motion degraded study. 2. Malpositioned Foley catheter bulb, currently CT weighted in the membranous part of the urethra. There are adjacent pockets of gas and free fluid concerning for a traumatic urethral injury. Repositioning is recommended. Urology consultation should be considered. 3. No evidence for an aortic dissection. No acute abnormality. 4. Chronic findings as detailed above, not  substantially changed from recent prior studies.   Raymondo Band, MD [facility] [phone] pager    01/02/2021, 10:26 AM

## 2021-01-02 NOTE — TOC Progression Note (Signed)
Transition of Care [facility]) - Progression Note    Patient Details  Name: Vincent Anthony MRN: [id number] Date of Birth: 25-Apr-1971  [facility]) CM/SW Contact  Golda Acre, RN Phone Number: 01/02/2021, 7:57 AM  Clinical Narrative:    4/06 assessment Patient continues to clinically look better He has still a rising leukemoid reaction and also new thrombocytopenia. There is no differential. He has been on pressors (which now only is levophed down from 2 pressors yesterday). He only is on stress dose hydrocortisone. There is no diarrhea. There is no abscess/pyelo imaging evidence on ct abd/pelv  I am not sure what to make of the leukemoid reaction yet but doesn't appear related to his infection. Will need to review his peripheral blood smear and differential  Original bcx grew non-esbl ecoli and e faecalis. Repeat bcx is negative   Plan: 1. Can change abx to amp-sulbactam from vanc/cefepime 2. Check differential with cbc and peripheral blood smear tomorrow 3.   Duration for enterococcus is likely 2 weeks, and for ecoli is 10 days 4.   Will check on e faecalis for linezolid susceptibility PLAN: return to snf when stable Expected Discharge Plan: Home/Self Care Barriers to Discharge: Continued Medical Work up  Expected Discharge Plan and Services Expected Discharge Plan: Home/Self Care   Discharge Planning Services: CM Consult   Living arrangements for the past 2 months: Single Family Home                                       Social Determinants of Health (SDOH) Interventions    Readmission Risk Interventions No flowsheet data found.

## 2021-01-02 NOTE — Progress Notes (Addendum)
NAME:  Vincent Anthony, MRN:  [id number], DOB:  [birth year], LOS: 4 ADMISSION DATE:  12/29/2020, CONSULTATION DATE:  12/30/20 REFERRING MD:  [facility], CHIEF COMPLAINT:    History of Present Illness:  50 y/o M with MS, overactive bladder with indwelling foley for 1 month who [facility] after a traumatic foley exchange attempt with hematuria. Seen in ED with sepsis physiology   Pertinent  Medical History  MS - baseline mostly bed bound, OOB with assist, requires feeding assist Arthritis Transaminitis GERD Tremor?  Neurogenic bladder COVID - 11/17/20  Significant Hospital Events: Including procedures, antibiotic start and stop dates in addition to other pertinent events    4/03 Admit with hematuria, malpositioned foley, septic shock. Received 2 units of PRBC. Seen with brief loss of consciousness in the ER - questionable seizure vs rigors/hypotension from sepsis, blood loss.  4/04 On levophed, vasopressin, urine less bloody. Cultures pending. Cefepime + Vanco  4/05 BC growing E-Coli & GPC, urine culture pending. Keppra stopped. Levo at 5, vaso. HR improved.  4/06 Soft pressures overnight during sleep, levophed restarted. Blood cultures with e-coli, GPC's   Interim History / Subjective:  No acute issues overnight  Pressors stopped at 0300  Objective   Blood pressure (!) 104/58, pulse 72, temperature 98.1 F (36.7 C), temperature source Oral, resp. rate 17, height 6\' 3"  (1.905 m), weight 110.7 kg, SpO2 95 %.        Intake/Output Summary (Last 24 hours) at 01/02/2021 0906 Last data filed at 01/02/2021 0600 Gross per 24 hour  Intake 983.83 ml  Output 2500 ml  Net -1516.17 ml   Filed Weights   12/29/20 1827 12/30/20 0500 01/02/21 0500  Weight: 99.8 kg 98.6 kg 110.7 kg    Examination: General: Pleasant middle aged male sitting in bed in no acute distress HEENT: [place]/AT, MM pink/moist, PERRL,  Neuro: Alert and oriented x3, non-focal  CV: s1s2  regular rate and rhythm, no murmur, rubs, or gallops,  PULM:  Clear to ascultation, no added breath sounds, no increased work of breathing  GI: soft, bowel sounds active in all 4 quadrants, non-tender, non-distended Extremities: warm/dry, no edema  Skin: no rashes or lesions  Labs/imaging that I have   4/4 Blood culture > Positive E.coli and Enterococcus Faecium   4/4 Urine culture > no growth   CTA chest/ABD/pelvis > Malpositioned Foley catheter bulb, currently CT weighted in the membranous part of the urethra. There are adjacent pockets of gas and free fluid concerning for a traumatic urethral injury  4/7 CXR > Stable cardiomegaly. Interim improvement of bilateral pulmonary infiltrates/edema and left-sided pleural effusion.  Resolved Hospital Problem list   Lactic Acidosis   Assessment & Plan:   Septic Shock secondary to E-COLI + possible GPC Bacteremia, sepsis physiology now resolved  -Suspect possible element of sepsis + blood loss with hematuria & relative AI with chronic steroid use.  Hx E-Coli UTI 07/2020.  Leukocytosis -WBC continues to rise but patient remains afebrile and no longer needs pressors  P: ID consulted, appreciate assistance  Culture data asa above  Continue Unasyn  MAP goal < 65 pressor stopped as of 4/7 Wean stress dose steroids  Monitor urine output  Hematuria secondary to Malpositioned Foley  -Required Urology placement of current foley. No hydronephrosis on CT.  -Will need outpatient f/u with primary urology at discharge, ok for facility RN to change catheter but need to be aware of 26ml balloon Urinary Retention  P:  Urology following, appreciate assistance  Flush foley PRN per urology recommendation  Folow I&O Continue Ditropan  AKI, improved  P: Follow renal function / urine output Trend Bmet Avoid nephrotoxins Ensure adequate renal perfusion  Entral hydration  Questionable Seizure vs Rigors from Fever  -Based on hx and patient  description, suspect this was rigors with fever to 102.6 and hypotension / AMS. He does not have a hx of seizure, but has been on keppra long term (?if this is for adjuvant pain control).  Wife denies hx of seizures.  P: Continue home Keppra Maintain neuro protective measures Nutrition and bowel regiment  Seizure precautions  Aspirations precautions   Anemia P: Trend CBC  Transfuse per protocol  Hgb goal > 7  DM P:  Continue SSI Home metformin remains on hold   MS  P: Continue home Lyric and Baclofen  PT/OT efforts   HLD  P: Continue home Pravachol  Constipation P: Continue home Linzess  Anxiety  P: PRN Xanax  Best practice  Diet:  Oral Pain/Anxiety/Delirium protocol (if indicated): No VAP protocol (if indicated): Not indicated DVT prophylaxis: SCDs, no heparin given bleeding GI prophylaxis: PPI Glucose control:  SSI Yes Central venous access:  Yes still needed Arterial line:  Yes, and it is no longer needed Foley:  Yes, and it is still needed Mobility:  bed rest  PT consulted: N/A Last date of multidisciplinary goals of care discussion:  Wife updated 4/6 via phone on patients status Code Status:  full code Disposition: ICU   Critical care time:    Performed by: Delfin Gant  Total critical care time: 37 minutes  Critical care time was exclusive of separately billable procedures and treating other patients.  Critical care was necessary to treat or prevent imminent or life-threatening deterioration.  Critical care was time spent personally by me on the following activities: development of treatment plan with patient and/or surrogate as well as nursing, discussions with consultants, evaluation of patient's response to treatment, examination of patient, obtaining history from patient or surrogate, ordering and performing treatments and interventions, ordering and review of laboratory studies, ordering and review of radiographic studies, pulse oximetry  and re-evaluation of patient's condition.  Delfin Gant, NP-C [facility] Personal contact information can be found on Amion  If no response please page: Adult pulmonary and critical care medicine pager on Amion unitl 7pm After 7pm please call [phone] 01/02/2021, 9:21 AM

## 2021-01-02 NOTE — Progress Notes (Signed)
eLink Physician-Brief Progress Note Patient Name: Vincent Anthony DOB: 1971-07-05 MRN: [id number]   Date of Service  01/02/2021  HPI/Events of Note  Patient c/o dry eyes.   eICU Interventions  Plan: 1. Artificial tears 2 drops in both eyes Q 4 hours PRN dry eyes.      Intervention Category Major Interventions: Other:  Lenell Antu 01/02/2021, 9:28 PM

## 2021-01-02 NOTE — Progress Notes (Signed)
K+ 3.4 Replaced per protocol  

## 2021-01-03 LAB — GLUCOSE, CAPILLARY
Glucose-Capillary: 84 mg/dL (ref 70–99)
Glucose-Capillary: 86 mg/dL (ref 70–99)
Glucose-Capillary: 89 mg/dL (ref 70–99)
Glucose-Capillary: 89 mg/dL (ref 70–99)

## 2021-01-03 LAB — CULTURE, BLOOD (SINGLE): Culture: NO GROWTH

## 2021-01-03 LAB — BASIC METABOLIC PANEL
Anion gap: 7 (ref 5–15)
BUN: 21 mg/dL — ABNORMAL HIGH (ref 6–20)
CO2: 21 mmol/L — ABNORMAL LOW (ref 22–32)
Calcium: 8.3 mg/dL — ABNORMAL LOW (ref 8.9–10.3)
Chloride: 113 mmol/L — ABNORMAL HIGH (ref 98–111)
Creatinine, Ser: 1.1 mg/dL (ref 0.61–1.24)
GFR, Estimated: >60 mL/min (ref >60)
Glucose, Bld: 101 mg/dL — ABNORMAL HIGH (ref 70–99)
Potassium: 4.5 mmol/L (ref 3.5–5.1)
Sodium: 141 mmol/L (ref 135–145)

## 2021-01-03 NOTE — Progress Notes (Signed)
PROGRESS NOTE  Vincent Anthony:[id number] DOB: March 16, 1971 DOA: 12/29/2020 PCP: [name], MD   LOS: 5 days   Brief narrative:  Patient is a 50 years old male with history of multiple sclerosis and overactive bladder on indwelling Foley catheter for 1 month presented initially to [facility] from [facility] after traumatic Foley exchange attempt with hematuria.  Patient had a new Foley catheter placed on 08/30/2021 but did not have any urinary output and had profuse bleeding.  They tried continuous bladder irrigation but unable to flush so patient ended up being in the hospital.  In the ED, was noted to have fever dyspnea hypotension and tachycardia with brief loss of consciousness.  He was thought to have sepsis secondary to instrumentation and was started on vancomycin Flagyl.  He was then admitted to the ICU for septic shock.   Patient was subsequently considered stable in the ICU and was weaned off vasopressors.  He was then transferred out of the ICU.   Assessment/Plan:  Active Problems:   Septic shock (HCC)   Bacteremia   Immunosuppressed status (HCC)  Septic Shock secondary to E-COLI and possible GPC Bacteremia,  sepsis physiology now resolved. Patient was vasopressors has been weaned off.  Currently stable.  Patient does have history of E. coli UTI in 11/21.  Patient is currently on Keflex total of 7 days and linezolid total of 14 days- end date of linezolid 4/17.  T-max of 98.8 F.  Leukocytosis Appears to be significantly high.  On antibiotic.  Fever has improved.  Off vasopressors.  Was on stress dose steroids.  We will continue to monitor WBC .   CBC Latest Ref Rng & Units 01/02/2021 01/01/2021 12/31/2020  WBC 4.0 - 10.5 K/uL 53.4(HH) 48.7(H) 40.0(H)  Hemoglobin 13.0 - 17.0 g/dL 2.9(B) 2.8(U) 1.3(K)  Hematocrit 39.0 - 52.0 % 28.7(L) 28.1(L) 27.9(L)  Platelets 150 - 400 K/uL 72(L) 64(L) 90(L)    Hematuria secondary to Malpositioned Foley  Patient  required Foley catheter replacement by urology during hospitalization.  No hydronephrosis.  Will need to follow-up with his primary urologist at discharge.   ok for facility RN to change catheter but need to be aware of 30ml balloon.  Urinary Retention  Continue treatment.  Continue Foley catheter care.  Outpatient follow-up.  AKI, improved  Encourage oral intake.  Continue to monitor BMP.  Questionable Seizure vs Rigors from Fever  On Keppra long-term for adjuvant pain control.  No history of seizures in the past.  Likely rigors with fever   Anemia Continue to monitor.  Transfuse if necessary.  Diabetes mellitus type 2. Continue sliding scale insulin, Accu-Cheks, diabetic diet.  Patient is on Metformin at home which is on hold.  History of multiple sclerosis. Continue Lyrica and baclofen.  PT OT.  Hyperlipidemia. Continue statins.   Constipation Continue Linzess  Anxiety  On Xanax  DVT prophylaxis: SCDs Start: 12/30/20 0240  Code Status: Full code  Family Communication:  None  Status is: Inpatient  Remains inpatient appropriate because:IV treatments appropriate due to intensity of illness or inability to take PO and Inpatient level of care appropriate due to severity of illness   Dispo: The patient is from: Skilled nursing facility              Anticipated d/c is to: Skilled nursing facility              Patient currently is not medically stable to d/c.   Difficult to place patient  No  Consultants:  Infectious disease,   PCCM  Urology  Procedures:  Foley catheter placement,   central line placement and removal.  Anti-infectives:  . Keflex 01/02/21> . Zyvox 01/02/21>  Anti-infectives (From admission, onward)   Start     Dose/Rate Route Frequency Ordered Stop   01/02/21 1200  cephALEXin (KEFLEX) capsule 500 mg        500 mg Oral Every 6 hours 01/02/21 1032 01/04/21 2359   01/02/21 1130  linezolid (ZYVOX) tablet 600 mg        600 mg Oral Every 12  hours 01/02/21 1032 01/15/21 0959   01/01/21 1400  ceFAZolin (ANCEF) IVPB 2g/100 mL premix  Status:  Discontinued        2 g 200 mL/hr over 30 Minutes Intravenous Every 8 hours 01/01/21 1135 01/01/21 1157   01/01/21 1400  Ampicillin-Sulbactam (UNASYN) 3 g in sodium chloride 0.9 % 100 mL IVPB  Status:  Discontinued        3 g 200 mL/hr over 30 Minutes Intravenous Every 6 hours 01/01/21 1157 01/02/21 1032   01/01/21 1200  vancomycin (VANCOREADY) IVPB 1500 mg/300 mL  Status:  Discontinued        1,500 mg 150 mL/hr over 120 Minutes Intravenous Every 12 hours 01/01/21 0958 01/01/21 1157   12/31/20 2000  vancomycin (VANCOREADY) IVPB 2000 mg/400 mL  Status:  Discontinued        2,000 mg 200 mL/hr over 120 Minutes Intravenous Every 24 hours 12/30/20 0212 12/31/20 0732   12/31/20 0800  vancomycin (VANCOREADY) IVPB 1250 mg/250 mL  Status:  Discontinued        1,250 mg 166.7 mL/hr over 90 Minutes Intravenous Every 12 hours 12/31/20 0732 01/01/21 0958   12/30/20 0400  ceFEPIme (MAXIPIME) 2 g in sodium chloride 0.9 % 100 mL IVPB  Status:  Discontinued        2 g 200 mL/hr over 30 Minutes Intravenous Every 8 hours 12/30/20 0212 01/01/21 1135   12/29/20 1930  vancomycin (VANCOCIN) IVPB 1000 mg/200 mL premix        1,000 mg 200 mL/hr over 60 Minutes Intravenous  Once 12/29/20 1855 12/29/20 2012   12/29/20 1700  ceFEPIme (MAXIPIME) 2 g in sodium chloride 0.9 % 100 mL IVPB        2 g 200 mL/hr over 30 Minutes Intravenous  Once 12/29/20 1650 12/29/20 1807   12/29/20 1700  metroNIDAZOLE (FLAGYL) IVPB 500 mg        500 mg 100 mL/hr over 60 Minutes Intravenous  Once 12/29/20 1650 12/29/20 1839   12/29/20 1700  vancomycin (VANCOCIN) IVPB 1000 mg/200 mL premix        1,000 mg 200 mL/hr over 60 Minutes Intravenous  Once 12/29/20 1650 12/29/20 2012       Subjective: Today, patient was seen and examined at bedside.  Patient complains of mild congestion.  Denies any sputum production fever chills or  rigor.   Objective: Vitals:   01/03/21 0700 01/03/21 0730  BP: (!) 105/52   Pulse: 73 63  Resp: 18 16  Temp:    SpO2: 97% 96%    Intake/Output Summary (Last 24 hours) at 01/03/2021 0745 Last data filed at 01/03/2021 0537 Gross per 24 hour  Intake 1081.71 ml  Output 930 ml  Net 151.71 ml   Filed Weights   12/30/20 0500 01/02/21 0500 01/03/21 0500  Weight: 98.6 kg 110.7 kg 107.3 kg   Body mass index is 29.57 kg/m.   Physical  Exam: GENERAL: Patient is alert awake and oriented.  Mildly dysarthric.  Not in obvious distress.  On room air. HENT: No scleral pallor or icterus. Pupils equally reactive to light. Oral mucosa is moist NECK: is supple, no gross swelling noted. CHEST:   Diminished breath sounds bilaterally. CVS: S1 and S2 heard, no murmur. Regular rate and rhythm.  ABDOMEN: Soft, non-tender, bowel sounds are present.  Urethral catheter in place with EXTREMITIES: Right upper extremity with atrophy and weakness and mild spasticity.  Bilateral lower extremity weakness more on the right CNS: Mild dysarthria.  Right upper extremity spasticity, bilateral lower extremity weakness SKIN: warm and dry  Data Review: I have personally reviewed the following laboratory data and studies,  CBC: Recent Labs  Lab 12/29/20 1635 12/29/20 1857 12/30/20 0248 12/31/20 0343 01/01/21 0300 01/02/21 0412  WBC 4.0  --  38.3* 40.0* 48.7* 53.4*  NEUTROABS 3.8  --   --   --   --  48.8*  HGB 11.9* 8.4* 10.9* 9.7* 9.6* 9.6*  HCT 37.8* 26.8* 32.3* 27.9* 28.1* 28.7*  MCV 87.5  --  82.8 80.6 81.7 82.9  PLT 250  --  161 90* 64* 72*   Basic Metabolic Panel: Recent Labs  Lab 12/30/20 0248 12/31/20 0343 01/01/21 0300 01/02/21 0412 01/03/21 0511  NA 134* 132* 133* 146* 141  K 3.9 3.5 3.0* 3.4* 4.5  CL 104 100 104 114* 113*  CO2 19* 21* 21* 23 21*  GLUCOSE 125* 118* 157* 116* 101*  BUN 17 19 19 14  21*  CREATININE 1.86* 1.35* 1.03 1.15 1.10  CALCIUM 7.5* 7.4* 7.5* 8.4* 8.3*   Liver  Function Tests: Recent Labs  Lab 12/29/20 1635 01/02/21 0412  AST 25 30  ALT 38 33  ALKPHOS 96 132*  BILITOT 0.6 0.7  PROT 6.0* 4.9*  ALBUMIN 3.2* 2.2*   No results for input(s): LIPASE, AMYLASE in the last 168 hours. No results for input(s): AMMONIA in the last 168 hours. Cardiac Enzymes: No results for input(s): CKTOTAL, CKMB, CKMBINDEX, TROPONINI in the last 168 hours. BNP (last 3 results) No results for input(s): BNP in the last 8760 hours.  ProBNP (last 3 results) No results for input(s): PROBNP in the last 8760 hours.  CBG: Recent Labs  Lab 01/02/21 0347 01/02/21 0755 01/02/21 1222 01/02/21 1708 01/02/21 2113  GLUCAP 113* 84 76 89 105*   Recent Results (from the past 240 hour(s))  Urine culture     Status: None   Collection Time: 12/29/20  4:34 PM   Specimen: In/Out Cath Urine  Result Value Ref Range Status   Specimen Description   Final    IN/OUT CATH URINE Performed at [facility], [address], [place], [place]    Special Requests   Final    NONE Performed at [facility], [address], [place], [place]    Culture   Final    NO GROWTH Performed at [facility], [address] [place], [place]    Report Status 12/31/2020 FINAL  Final  Blood culture (routine single)     Status: Abnormal   Collection Time: 12/29/20  4:35 PM   Specimen: Left Antecubital; Blood  Result Value Ref Range Status   Specimen Description   Final    LEFT ANTECUBITAL BOTTLES DRAWN AEROBIC AND ANAEROBIC   Special Requests Blood Culture adequate volume  Final   Culture  Setup Time   Final    GRAM NEGATIVE RODS ANAEROBIC BOTTLE  Gram Stain Report Called to,Read Back By and Verified With: HEAVNER,A@1211  BY MATTHEWS,B 4.4.22 PT @[facility]   Organism ID to follow GRAM POSITIVE COCCI ANAEROBIC BOTTLE ONLY CRITICAL RESULT CALLED TO, READ BACK BY AND VERIFIED WITH: PHARMD MELISSA M. 2002 [id number] FCP    Culture ESCHERICHIA COLI ENTEROCOCCUS FAECALIS   (A)  Final   Report Status 01/02/2021 FINAL  Final   Organism ID, Bacteria ESCHERICHIA COLI  Final   Organism ID, Bacteria ENTEROCOCCUS FAECALIS  Final      Susceptibility   Escherichia coli - MIC*    AMPICILLIN 16 INTERMEDIATE Intermediate     CEFAZOLIN <=4 SENSITIVE Sensitive     CEFEPIME <=0.12 SENSITIVE Sensitive     CEFTAZIDIME <=1 SENSITIVE Sensitive     CEFTRIAXONE <=0.25 SENSITIVE Sensitive     CIPROFLOXACIN <=0.25 SENSITIVE Sensitive     GENTAMICIN <=1 SENSITIVE Sensitive     IMIPENEM <=0.25 SENSITIVE Sensitive     TRIMETH/SULFA >=320 RESISTANT Resistant     AMPICILLIN/SULBACTAM 4 SENSITIVE Sensitive     PIP/TAZO <=4 SENSITIVE Sensitive     * ESCHERICHIA COLI   Enterococcus faecalis - MIC*    AMPICILLIN <=2 SENSITIVE Sensitive     VANCOMYCIN 1 SENSITIVE Sensitive     GENTAMICIN SYNERGY SENSITIVE Sensitive     LINEZOLID Value in next row Sensitive      SENSITIVE2Performed at [facility], [address] [place], [place]    * ENTEROCOCCUS FAECALIS  Blood Culture ID Panel (Reflexed)     Status: Abnormal   Collection Time: 12/29/20  4:35 PM  Result Value Ref Range Status   Enterococcus faecalis DETECTED (A) NOT DETECTED Final    Comment: CRITICAL RESULT CALLED TO, READ BACK BY AND VERIFIED WITH: PHARMD MELISSA M. 2002 [id number] FCP    Enterococcus Faecium NOT DETECTED NOT DETECTED Final   Listeria monocytogenes NOT DETECTED NOT DETECTED Final   Staphylococcus species NOT DETECTED NOT DETECTED Final   Staphylococcus aureus (BCID) NOT DETECTED NOT DETECTED Final   Staphylococcus epidermidis NOT DETECTED NOT DETECTED Final   Staphylococcus lugdunensis NOT DETECTED NOT DETECTED Final   Streptococcus species NOT DETECTED NOT DETECTED Final   Streptococcus agalactiae NOT DETECTED NOT DETECTED Final   Streptococcus pneumoniae NOT DETECTED NOT DETECTED Final   Streptococcus pyogenes NOT DETECTED NOT DETECTED Final   A.calcoaceticus-baumannii NOT DETECTED NOT  DETECTED Final   Bacteroides fragilis NOT DETECTED NOT DETECTED Final   Enterobacterales DETECTED (A) NOT DETECTED Final    Comment: Enterobacterales represent a large order of gram negative bacteria, not a single organism. CRITICAL RESULT CALLED TO, READ BACK BY AND VERIFIED WITH: PHARMD MELISSA M. 2002 [id number] FCP    Enterobacter cloacae complex NOT DETECTED NOT DETECTED Final   Escherichia coli DETECTED (A) NOT DETECTED Final    Comment: CRITICAL RESULT CALLED TO, READ BACK BY AND VERIFIED WITH: PHARMD MELISSA M. 2002 [id number] FCP    Klebsiella aerogenes NOT DETECTED NOT DETECTED Final   Klebsiella oxytoca NOT DETECTED NOT DETECTED Final   Klebsiella pneumoniae NOT DETECTED NOT DETECTED Final   Proteus species NOT DETECTED NOT DETECTED Final   Salmonella species NOT DETECTED NOT DETECTED Final   Serratia marcescens NOT DETECTED NOT DETECTED Final   Haemophilus influenzae NOT DETECTED NOT DETECTED Final   Neisseria meningitidis NOT DETECTED NOT DETECTED Final   Pseudomonas aeruginosa NOT DETECTED NOT DETECTED Final   Stenotrophomonas maltophilia NOT DETECTED NOT DETECTED Final   Candida albicans NOT DETECTED NOT DETECTED Final   Candida  auris NOT DETECTED NOT DETECTED Final   Candida glabrata NOT DETECTED NOT DETECTED Final   Candida krusei NOT DETECTED NOT DETECTED Final   Candida parapsilosis NOT DETECTED NOT DETECTED Final   Candida tropicalis NOT DETECTED NOT DETECTED Final   Cryptococcus neoformans/gattii NOT DETECTED NOT DETECTED Final   CTX-M ESBL NOT DETECTED NOT DETECTED Final   Carbapenem resistance IMP NOT DETECTED NOT DETECTED Final   Carbapenem resistance KPC NOT DETECTED NOT DETECTED Final   Carbapenem resistance NDM NOT DETECTED NOT DETECTED Final   Carbapenem resist OXA 48 LIKE NOT DETECTED NOT DETECTED Final   Vancomycin resistance NOT DETECTED NOT DETECTED Final   Carbapenem resistance VIM NOT DETECTED NOT DETECTED Final    Comment: Performed at [facility], [address] [place], [place] [address]  Culture, blood (single)     Status: None (Preliminary result)   Collection Time: 12/29/20  6:50 PM   Specimen: A-Line; Blood  Result Value Ref Range Status   Specimen Description A-LINE CENTRAL LINE  Final   Special Requests   Final    Blood Culture results may not be optimal due to an excessive volume of blood received in culture bottles BOTTLES DRAWN AEROBIC AND ANAEROBIC   Culture   Final    NO GROWTH 3 DAYS Performed at [facility], [address], [place], [place] [address]    Report Status PENDING  Incomplete  MRSA PCR Screening     Status: None   Collection Time: 12/29/20 11:36 PM   Specimen: Nasopharyngeal  Result Value Ref Range Status   MRSA by PCR NEGATIVE NEGATIVE Final    Comment:        The GeneXpert MRSA Assay (FDA approved for NASAL specimens only), is one component of a comprehensive MRSA colonization surveillance program. It is not intended to diagnose MRSA infection nor to guide or monitor treatment for MRSA infections. Performed at [facility], [address], [place], [place] [address]   Culture, blood (routine x 2)     Status: None (Preliminary result)   Collection Time: 12/31/20  4:01 PM   Specimen: BLOOD  Result Value Ref Range Status   Specimen Description   Final    BLOOD RIGHT HAND Performed at [facility], [address] [place], [place] [address]    Special Requests   Final    BOTTLES DRAWN AEROBIC AND ANAEROBIC Blood Culture adequate volume Performed at [facility], [address] [place], [place] [address]    Culture   Final    NO GROWTH 2 DAYS Performed at [facility], [address] [place], [place] [address]    Report Status PENDING  Incomplete  Culture, blood (routine x 2)     Status: None (Preliminary result)   Collection Time: 12/31/20  4:07 PM   Specimen: BLOOD  Result Value Ref Range Status   Specimen  Description   Final    BLOOD RIGHT HAND Performed at [facility], [address], [place], [place] [address]    Special Requests   Final    BOTTLES DRAWN AEROBIC AND ANAEROBIC Blood Culture results may not be optimal due to an inadequate volume of blood received in culture bottles Performed at [facility], [address], [place], [place] [address]    Culture   Final    NO GROWTH 2 DAYS Performed at [facility], [address] [place], [place] [address]  Report Status PENDING  Incomplete     Studies: DG CHEST PORT 1 VIEW  Result Date: 01/02/2021 CLINICAL DATA:  Bacteremia. EXAM: PORTABLE CHEST 1 VIEW COMPARISON:  12/31/2020. FINDINGS: Right IJ line in stable position. Stable cardiomegaly. Interim improvement of bilateral pulmonary infiltrates/edema with mild residual. Findings suggest improvement of CHF. Improved small left pleural effusion. No pneumothorax. IMPRESSION: 1.  Right IJ line in stable position. 2. Stable cardiomegaly. Interim improvement of bilateral pulmonary infiltrates/edema and left-sided pleural effusion. Mild residual. Findings suggest improving CHF. Electronically Signed   By: Maisie Fus  Register   On: 01/02/2021 05:23     Joycelyn Das, MD  [facility] 01/03/2021  If 7PM-7AM, please contact night-coverage

## 2021-01-04 DIAGNOSIS — G35 Multiple sclerosis: Secondary | ICD-10-CM

## 2021-01-04 DIAGNOSIS — N319 Neuromuscular dysfunction of bladder, unspecified: Secondary | ICD-10-CM

## 2021-01-04 DIAGNOSIS — E876 Hypokalemia: Secondary | ICD-10-CM

## 2021-01-04 LAB — GLUCOSE, CAPILLARY
Glucose-Capillary: 83 mg/dL (ref 70–99)
Glucose-Capillary: 84 mg/dL (ref 70–99)
Glucose-Capillary: 105 mg/dL — ABNORMAL HIGH (ref 70–99)
Glucose-Capillary: 87 mg/dL (ref 70–99)
Glucose-Capillary: 119 mg/dL — ABNORMAL HIGH (ref 70–99)
Glucose-Capillary: 128 mg/dL — ABNORMAL HIGH (ref 70–99)

## 2021-01-04 LAB — CBC
HCT: 31.7 % — ABNORMAL LOW (ref 39.0–52.0)
Hemoglobin: 10.5 g/dL — ABNORMAL LOW (ref 13.0–17.0)
MCH: 28 pg (ref 26.0–34.0)
MCHC: 33.1 g/dL (ref 30.0–36.0)
MCV: 84.5 fL (ref 80.0–100.0)
Platelets: 125 10*3/uL — ABNORMAL LOW (ref 150–400)
RBC: 3.75 MIL/uL — ABNORMAL LOW (ref 4.22–5.81)
RDW: 18.6 % — ABNORMAL HIGH (ref 11.5–15.5)
WBC: 19.5 10*3/uL — ABNORMAL HIGH (ref 4.0–10.5)
nRBC: 0.2 % (ref 0.0–0.2)

## 2021-01-04 LAB — COMPREHENSIVE METABOLIC PANEL
ALT: 52 U/L — ABNORMAL HIGH (ref 0–44)
AST: 39 U/L (ref 15–41)
Albumin: 2.4 g/dL — ABNORMAL LOW (ref 3.5–5.0)
Alkaline Phosphatase: 116 U/L (ref 38–126)
Anion gap: 8 (ref 5–15)
BUN: 19 mg/dL (ref 6–20)
CO2: 23 mmol/L (ref 22–32)
Calcium: 8.2 mg/dL — ABNORMAL LOW (ref 8.9–10.3)
Chloride: 108 mmol/L (ref 98–111)
Creatinine, Ser: 1.12 mg/dL (ref 0.61–1.24)
GFR, Estimated: >60 mL/min (ref >60)
Glucose, Bld: 83 mg/dL (ref 70–99)
Potassium: 2.9 mmol/L — ABNORMAL LOW (ref 3.5–5.1)
Sodium: 139 mmol/L (ref 135–145)
Total Bilirubin: 0.8 mg/dL (ref 0.3–1.2)
Total Protein: 5 g/dL — ABNORMAL LOW (ref 6.5–8.1)

## 2021-01-04 LAB — MAGNESIUM: Magnesium: 1.7 mg/dL (ref 1.7–2.4)

## 2021-01-04 LAB — PHOSPHORUS: Phosphorus: 2.8 mg/dL (ref 2.5–4.6)

## 2021-01-04 MED ORDER — POTASSIUM CHLORIDE 10 MEQ/100ML IV SOLN
10.0000 meq | INTRAVENOUS | Status: AC
  Administered 2021-01-04 (×4): 10 meq via INTRAVENOUS
  Filled 2021-01-04 (×4): qty 100

## 2021-01-04 MED ORDER — MAGNESIUM OXIDE 400 (241.3 MG) MG PO TABS
400.0000 mg | ORAL_TABLET | Freq: Once | ORAL | Status: AC
  Administered 2021-01-04: 400 mg via ORAL
  Filled 2021-01-04: qty 1

## 2021-01-04 MED ORDER — MAGNESIUM OXIDE 400 (241.3 MG) MG PO TABS
400.0000 mg | ORAL_TABLET | Freq: Two times a day (BID) | ORAL | Status: DC
  Administered 2021-01-04 – 2021-01-06 (×5): 400 mg via ORAL
  Filled 2021-01-04 (×5): qty 1

## 2021-01-04 MED ORDER — POTASSIUM CHLORIDE CRYS ER 20 MEQ PO TBCR
40.0000 meq | EXTENDED_RELEASE_TABLET | Freq: Two times a day (BID) | ORAL | Status: AC
  Administered 2021-01-04 (×2): 40 meq via ORAL
  Filled 2021-01-04 (×2): qty 2

## 2021-01-04 NOTE — Evaluation (Addendum)
Physical Therapy Evaluation Patient Details Name: Vincent Anthony MRN: [id number] DOB: 14-Apr-1971 Today's Date: 01/04/2021   History of Present Illness  50 years old male present due to new catheter placed 12/29/20, no UOP, developed profuse bleeding upon removal, tried CBI, attempted to place another catheter but pt began shivering, fever, dyspnea, hypotension, tachycardia, brief LOC, blood transfusion, transferred to [facility] and foley placed on 4/4. PMH: MS and overactive bladder on indwelling Foley catheter placed 11/27/20    Clinical Impression  Pt admitted with above diagnosis. Pt slightly confused regarding baseline at facility, current transfer status and therapy goals at SNF. Pt currently requiring max A +2 for supine<>sit, heavy multimodal cues and increased time. Pt requires max A initially, progresses to min G with multimodal cues in sitting for balance. Pt tolerates sitting for ~8 minutes before return to supine. Educated pt on supine LE strengthening exercises and pt verbalizes understanding. Pt follows commands appropriately with increased time. Pt reports ambulating January 2022, confirmed in chart review, and SNF rehab interrupted by covid and now foley catheter complications; recommend return to SNF to continue rehab. Pt currently with functional limitations due to the deficits listed below (see PT Problem List). Pt will benefit from skilled PT to increase their independence and safety with mobility to allow discharge to the venue listed below.       Follow Up Recommendations SNF    Equipment Recommendations  None recommended by PT    Recommendations for Other Services       Precautions / Restrictions Precautions Precautions: Fall Restrictions Weight Bearing Restrictions: No      Mobility  Bed Mobility Overal bed mobility: Needs Assistance Bed Mobility: Supine to Sit;Sit to Supine  Supine to sit: Max assist;+2 for physical assistance Sit to supine: Max assist;+2 for  physical assistance   General bed mobility comments: constant verbal cues for hand placement and sequencing, difficulty motor planning requiring occasional multimodal cues, max A +2 for trunk and BLE; pt initially attempting extensor pattern to sit EOB, multimodal cues to decrease and therapist blocking BLE to prevent sliding when rising    Transfers  General transfer comment: unable, fatigued with supine to sit and sitting EOB; hoyer transfer recently at [facility]  Ambulation/Gait  General Gait Details: w/c baseline  Stairs            Wheelchair Mobility    Modified Rankin (Stroke Patients Only)       Balance Overall balance assessment: Needs assistance Sitting-balance support: Feet supported;Bilateral upper extremity supported Sitting balance-Leahy Scale: Poor Sitting balance - Comments: max A initially, progressing to min G with heavy multimodal cues, postural deficits in sitting and fatigues easily        Pertinent Vitals/Pain Pain Assessment: No/denies pain    Home Living Family/patient expects to be discharged to:: Skilled nursing facility  Additional Comments: Pt from [facility] SNF, plans to return    Prior Function Level of Independence: Needs assistance   Gait / Transfers Assistance Needed: hoyer transfers, slideboard transfers at some point but unsure, working on [facility] [employer] at [employer]  ADL's / [employer] Assistance Needed: pt assisted by facility  Comments: Pt appears slightly confused in regards to timeline, states "it's a blur"     Hand Dominance   Dominant Hand: Right    Extremity/Trunk Assessment   Upper Extremity Assessment Upper Extremity Assessment: Defer to OT evaluation    Lower Extremity Assessment Lower Extremity Assessment: RLE deficits/detail;LLE deficits/detail RLE Deficits / Details: ankle DF 0/5, PF 1/5; knee  flexion 2-/5,knee extension 1/5; hip abd 1/5, hip add 2-/5; pt able to perform quad set, unable to perform SLR, unable to obtain  neutral ankle AROM RLE Sensation: WNL RLE Coordination: decreased fine motor;decreased gross motor LLE Deficits / Details: ankle DF 2-/5, PF 2-/5; knee flexion 2-/5,knee extension 2-/5; hip abd 2-/5, hip add 2-/5; pt able to perform quad set, SLR 1/2 inch off bed; unable to obtain neutral ankle PROM LLE Sensation: WNL LLE Coordination: decreased fine motor;decreased gross motor    Cervical / Trunk Assessment Cervical / Trunk Assessment: Other exceptions Cervical / Trunk Exceptions: appears in sitting weight-shifted to R ischial tuberosity with L shoudler rotated forward and L lateral trunk flexion  Communication   Communication: No difficulties  Cognition Arousal/Alertness: Awake/alert Behavior During Therapy: WFL for tasks assessed/performed Overall Cognitive Status: No family/caregiver present to determine baseline cognitive functioning  General Comments: Pt appears slightly confused regarding timeline, difficulty recalling SNF name but knows he is from a SNF, oriented to self and current location. Follows commands appropriately with increased time, tangential speech occasionally      General Comments General comments (skin integrity, edema, etc.): VSS    Exercises     Assessment/Plan    PT Assessment Patient needs continued PT services  PT Problem List Decreased strength;Decreased range of motion;Decreased activity tolerance;Decreased balance;Decreased mobility;Decreased coordination;Decreased cognition;Decreased knowledge of use of DME;Obesity       PT Treatment Interventions DME instruction;Gait training;Functional mobility training;Therapeutic activities;Therapeutic exercise;Balance training;Neuromuscular re-education;Cognitive remediation;Patient/family education    PT Goals (Current goals can be found in the Care Plan section)  Acute Rehab PT Goals Patient Stated Goal: "back to [facility] to get stronger" PT Goal Formulation: With patient Time For Goal Achievement:  01/18/21 Potential to Achieve Goals: Fair    Frequency Min 2X/week   Barriers to discharge        Co-evaluation PT/OT/SLP Co-Evaluation/Treatment: Yes Reason for Co-Treatment: For patient/therapist safety;To address functional/ADL transfers PT goals addressed during session: Mobility/safety with mobility;Balance;Strengthening/ROM         AM-PAC PT "6 Clicks" Mobility  Outcome Measure Help needed turning from your back to your side while in a flat bed without using bedrails?: Total Help needed moving from lying on your back to sitting on the side of a flat bed without using bedrails?: Total Help needed moving to and from a bed to a chair (including a wheelchair)?: Total Help needed standing up from a chair using your arms (e.g., wheelchair or bedside chair)?: Total Help needed to walk in hospital room?: Total Help needed climbing 3-5 steps with a railing? : Total 6 Click Score: 6    End of Session   Activity Tolerance: Patient tolerated treatment well;Patient limited by fatigue Patient left: in bed;with call bell/phone within reach Nurse Communication: Mobility status;Other (comment) (dressing removed due to soiled) PT Visit Diagnosis: Muscle weakness (generalized) (M62.81);Difficulty in walking, not elsewhere classified (R26.2);Other symptoms and signs involving the nervous system (I33.825)    Time: 0539-7673 PT Time Calculation (min) (ACUTE ONLY): 34 min   Charges:   PT Evaluation $PT Eval Moderate Complexity: 1 Mod           Tori Azaryah Heathcock PT, DPT 01/04/21, 1:06 PM

## 2021-01-04 NOTE — Evaluation (Addendum)
Occupational Therapy Evaluation Patient Details Name: Vincent Anthony MRN: [id number] DOB: [birth year] Today's Date: 01/04/2021    History of Present Illness 50 years old male present due to new catheter placed 12/29/20, no UOP, developed profuse bleeding upon removal, tried CBI, attempted to place another catheter but pt began shivering, fever, dyspnea, hypotension, tachycardia, brief LOC, blood transfusion, transferred to [facility] and foley placed on 4/4. PMH: MS and overactive bladder on indwelling Foley catheter placed 11/27/20   Clinical Impression   Patient presents with decreased ROM and strength of RUE with non functional right dominant hand, tremors in LUE, impaired ROM and strength in bilateral lower extremities, generalized weakness, poor activity tolerance, impaired balance and some cognitive impairments. Patient has been at [facility] and getting rehab. Currently patient max x 2 for supine to sit, needing external assistance to sit edge of bed and needing mod-total assist for ADLs. Patient will benefit from skilled OT services while in hospital to improve deficits and learn compensatory strategies as needed in order to improve functional abilities and reduce caregiver burden.  Recommend return to facility to continue rehab to maximize abilities.    Follow Up Recommendations  SNF    Equipment Recommendations  None recommended by OT    Recommendations for Other Services       Precautions / Restrictions Precautions Precautions: Fall Precaution Comments: Tends to extend Restrictions Weight Bearing Restrictions: No      Mobility Bed Mobility Overal bed mobility: Needs Assistance Bed Mobility: Supine to Sit;Sit to Supine     Supine to sit: Max assist;+2 for physical assistance Sit to supine: Max assist;+2 for physical assistance   General bed mobility comments: constant verbal cues for hand placement and sequencing, difficulty motor planning requiring occasional multimodal cues, max A +2  for trunk and BLE; pt initially attempting extensor pattern to sit EOB, multimodal cues to decrease and therapist blocking BLE to prevent sliding when rising    Transfers                 General transfer comment: unable, fatigued with supine to sit and sitting EOB; hoyer transfer recently at [facility]    Balance Overall balance assessment: Needs assistance Sitting-balance support: Feet supported;Bilateral upper extremity supported Sitting balance-Leahy Scale: Poor Sitting balance - Comments: max A initially, progressing to min G with heavy multimodal cues, postural deficits in sitting and fatigues easily                                   ADL either performed or assessed with clinical judgement   ADL Overall ADL's : Needs assistance/impaired Eating/Feeding: Maximal assistance;Bed level   Grooming: Bed level;Moderate assistance   Upper Body Bathing: Bed level;Moderate assistance   Lower Body Bathing: Maximal assistance;Bed level   Upper Body Dressing : Maximal assistance;Bed level   Lower Body Dressing: Total assistance;Bed level       Toileting- Clothing Manipulation and Hygiene: Total assistance;Bed level;+2 for physical assistance               Vision Patient Visual Report: No change from baseline       Perception     Praxis      Pertinent Vitals/Pain Pain Assessment: No/denies pain     Hand Dominance Right   Extremity/Trunk Assessment Upper Extremity Assessment Upper Extremity Assessment: RUE deficits/detail;LUE deficits/detail RUE Deficits / Details: Grossly functional shoulder ROM to 90 degrees, lacks approx 20-30 degrees elbow  extension, wrist able to be passively ranged into extension but maintains flexed position, tendon tightness in DIPs and PIPs unable to be fully extended to end range. Unable to functionally hand. RUE Sensation: WNL RUE Coordination: decreased fine motor;decreased gross motor LUE Deficits / Details: Grossly  functional ROM and good strength. LUE tremors distally with activity and proximal movement LUE Sensation: WNL LUE Coordination: decreased fine motor   Lower Extremity Assessment Lower Extremity Assessment: Defer to PT evaluation RLE Deficits / Details: ankle DF 0/5, PF 1/5; knee flexion 2-/5,knee extension 1/5; hip abd 1/5, hip add 2-/5; pt able to perform quad set, unable to perform SLR, unable to obtain neutral ankle AROM RLE Sensation: WNL RLE Coordination: decreased fine motor;decreased gross motor LLE Deficits / Details: ankle DF 2-/5, PF 2-/5; knee flexion 2-/5,knee extension 2-/5; hip abd 2-/5, hip add 2-/5; pt able to perform quad set, SLR 1/2 inch off bed; unable to obtain neutral ankle PROM LLE Sensation: WNL LLE Coordination: decreased fine motor;decreased gross motor   Cervical / Trunk Assessment Cervical / Trunk Assessment: Other exceptions Cervical / Trunk Exceptions: appears in sitting weight-shifted to R ischial tuberosity with L shoudler rotated forward and L lateral trunk flexion   Communication Communication Communication: No difficulties   Cognition Arousal/Alertness: Awake/alert Behavior During Therapy: WFL for tasks assessed/performed Overall Cognitive Status: No family/caregiver present to determine baseline cognitive functioning                                 General Comments: Pt appears slightly confused regarding timeline, difficulty recalling SNF name but knows he is from a SNF, oriented to self and current location. Follows commands appropriately with increased time, tangential speech occasionally   General Comments  VSS    Exercises Other Exercises Other Exercises: Edema in LUE, provided with squeeze ball for hand edema   Shoulder Instructions      Home Living Family/patient expects to be discharged to:: Skilled nursing facility                                 Additional Comments: Pt from [facility] SNF, plans to return       Prior Functioning/Environment Level of Independence: Needs assistance  Gait / Transfers Assistance Needed: hoyer transfers, slideboard transfers at some point but unsure, working on [facility] at [employer] ADL's / [employer] Assistance Needed: pt assisted by facility for all ADLs.   Comments: Pt appears slightly confused in regards to timeline, Anthony "it's a blur"        OT Problem List: Decreased strength;Decreased range of motion;Decreased activity tolerance;Impaired balance (sitting and/or standing);Decreased coordination;Decreased cognition;Decreased knowledge of use of DME or AE;Impaired UE functional use;Obesity;Increased edema      OT Treatment/Interventions: Self-care/ADL training;Therapeutic exercise;DME and/or AE instruction;Therapeutic activities;Splinting;Patient/family education;Balance training    OT Goals(Current goals can be found in the care plan section) Acute Rehab OT Goals Patient Stated Goal: "back to [facility] to get stronger" OT Goal Formulation: With patient Time For Goal Achievement: 01/18/21 Potential to Achieve Goals: Fair ADL Goals Pt Will Perform Grooming: with min assist;sitting Pt Will Perform Upper Body Bathing: with set-up;sitting Additional ADL Goal #1: Patient will sit edge of bed with min guard as evidence of improving sitting balance needed for functional mobility and ADLs Additional ADL Goal #2: Patient will feed self finger foods with left hand using compensatory strategies.  OT Frequency: Min 2X/week   Barriers to D/C:            Co-evaluation   Reason for Co-Treatment: For patient/therapist safety;To address functional/ADL transfers PT goals addressed during session: Mobility/safety with mobility;Balance;Strengthening/ROM        AM-PAC OT "6 Clicks" Daily Activity     Outcome Measure Help from another person eating meals?: A Lot Help from another person taking care of personal grooming?: A Little Help from another person toileting,  which includes using toliet, bedpan, or urinal?: Total Help from another person bathing (including washing, rinsing, drying)?: A Lot Help from another person to put on and taking off regular upper body clothing?: A Lot Help from another person to put on and taking off regular lower body clothing?: Total 6 Click Score: 11   End of Session Nurse Communication: Mobility status  Activity Tolerance: Patient limited by fatigue Patient left: in bed;with call bell/phone within reach  OT Visit Diagnosis: Other abnormalities of gait and mobility (R26.89);Muscle weakness (generalized) (M62.81);Other symptoms and signs involving the nervous system (R29.898);Other symptoms and signs involving cognitive function                Time: 1202-1236 OT Time Calculation (min): 34 min Charges:  OT General Charges $OT Visit: 1 Visit OT Evaluation $OT Eval Moderate Complexity: 1 Mod  Ludia Gartland, OTR/L Acute Care Rehab Services  Office [phone] Pager: [phone]   Kelli Churn 01/04/2021, 1:40 PM

## 2021-01-04 NOTE — Progress Notes (Signed)
PROGRESS NOTE  Vincent Anthony JHE:[id number] DOB: [birth year] DOA: 12/29/2020 PCP: Vincent Robert, MD   LOS: 6 days   Brief narrative:  Patient is a 50 years old male with history of multiple sclerosis and overactive bladder on indwelling Foley catheter for 1 month presented initially to [facility] from [facility] after traumatic Foley exchange attempt with hematuria.  Patient had a new Foley catheter placed on 08/30/2021 but did not have any urinary output and had profuse bleeding.  They tried continuous bladder irrigation but unable to flush so patient ended up being in the hospital.  In the ED, was noted to have fever dyspnea hypotension and tachycardia with brief loss of consciousness.  He was thought to have sepsis secondary to instrumentation and was started on vancomycin Flagyl.  He was then admitted to the ICU for septic shock.   Patient was subsequently considered stable in the ICU and was weaned off vasopressors.  He was then transferred out of the ICU.   Assessment/Plan:  Principal Problem:   Septic shock (HCC) Active Problems:   MS (multiple sclerosis) (HCC)   Neurogenic bladder   Bacteremia   Immunosuppressed status (HCC)  Septic Shock secondary to E-coli and possible GPC Bacteremia,  sepsis physiology now resolved. Patient was vasopressors has been weaned off.  Currently stable.  Patient does have history of E. coli UTI in 11/21.  Patient is currently on Keflex total of 7 days and linezolid total of 14 days- end date of linezolid 4/17.  T-max of 98.8 F.  Leukocytosis Trending down.  Off steroids.  On antibiotics. .   CBC Latest Ref Rng & Units 01/04/2021 01/02/2021 01/01/2021  WBC 4.0 - 10.5 K/uL 19.5(H) 53.4(HH) 48.7(H)  Hemoglobin 13.0 - 17.0 g/dL 10.5(L) 9.6(L) 9.6(L)  Hematocrit 39.0 - 52.0 % 31.7(L) 28.7(L) 28.1(L)  Platelets 150 - 400 K/uL 125(L) 72(L) 64(L)    Hematuria secondary to Malpositioned Foley  Resolved.  Continue Foley catheter.  No  hydronephrosis.  Will need to follow-up with his primary urologist after discharge.  Ok for facility RN to change catheter but need to be aware of 38ml balloon.  Urinary Retention  Continue Foley catheter.  Acute kidney injury, improved  Creatinine of 1.1.  At baseline.  Has improved.  Encourage oral intake.  Questionable Seizure  On Keppra long-term for adjuvant pain control.  No history of seizures in the past.    Anemia Continue to monitor.  Transfuse if necessary.  Latest hemoglobin of 10.5.  Diabetes mellitus type 2. Continue sliding scale insulin, Accu-Cheks, diabetic diet.  Patient is on Metformin at home which is on hold.  History of multiple sclerosis. Continue Lyrica and baclofen.  Consult PT OT  Hyperlipidemia. Continue statins.   Constipation Continue Linzess  Hypokalemia.  will replace.  Check levels in a.m.  Anxiety  On Xanax  DVT prophylaxis: SCDs Start: 12/30/20 0240  Code Status: Full code  Family Communication:  I spoke with the patient's spouse Ms [name] on the phone and updated her about the clinical condition of the patient.  Status is: Inpatient  Remains inpatient appropriate because:IV treatments appropriate due to intensity of illness or inability to take PO and Inpatient level of care appropriate due to severity of illness   Dispo: The patient is from: Skilled nursing facility              Anticipated d/c is to: Skilled nursing facility likely in 1 to 2 days.  Patient currently is not medically stable to d/c.   Difficult to place patient No  Consultants:  Infectious disease,   PCCM  Urology  Procedures:  Foley catheter placement,   central line placement and removal.  Anti-infectives:  . Keflex 01/02/21> . Zyvox 01/02/21>  Anti-infectives (From admission, onward)   Start     Dose/Rate Route Frequency Ordered Stop   01/02/21 1200  cephALEXin (KEFLEX) capsule 500 mg        500 mg Oral Every 6 hours 01/02/21  1032 01/04/21 2359   01/02/21 1130  linezolid (ZYVOX) tablet 600 mg        600 mg Oral Every 12 hours 01/02/21 1032 01/15/21 0959   01/01/21 1400  ceFAZolin (ANCEF) IVPB 2g/100 mL premix  Status:  Discontinued        2 g 200 mL/hr over 30 Minutes Intravenous Every 8 hours 01/01/21 1135 01/01/21 1157   01/01/21 1400  Ampicillin-Sulbactam (UNASYN) 3 g in sodium chloride 0.9 % 100 mL IVPB  Status:  Discontinued        3 g 200 mL/hr over 30 Minutes Intravenous Every 6 hours 01/01/21 1157 01/02/21 1032   01/01/21 1200  vancomycin (VANCOREADY) IVPB 1500 mg/300 mL  Status:  Discontinued        1,500 mg 150 mL/hr over 120 Minutes Intravenous Every 12 hours 01/01/21 0958 01/01/21 1157   12/31/20 2000  vancomycin (VANCOREADY) IVPB 2000 mg/400 mL  Status:  Discontinued        2,000 mg 200 mL/hr over 120 Minutes Intravenous Every 24 hours 12/30/20 0212 12/31/20 0732   12/31/20 0800  vancomycin (VANCOREADY) IVPB 1250 mg/250 mL  Status:  Discontinued        1,250 mg 166.7 mL/hr over 90 Minutes Intravenous Every 12 hours 12/31/20 0732 01/01/21 0958   12/30/20 0400  ceFEPIme (MAXIPIME) 2 g in sodium chloride 0.9 % 100 mL IVPB  Status:  Discontinued        2 g 200 mL/hr over 30 Minutes Intravenous Every 8 hours 12/30/20 0212 01/01/21 1135   12/29/20 1930  vancomycin (VANCOCIN) IVPB 1000 mg/200 mL premix        1,000 mg 200 mL/hr over 60 Minutes Intravenous  Once 12/29/20 1855 12/29/20 2012   12/29/20 1700  ceFEPIme (MAXIPIME) 2 g in sodium chloride 0.9 % 100 mL IVPB        2 g 200 mL/hr over 30 Minutes Intravenous  Once 12/29/20 1650 12/29/20 1807   12/29/20 1700  metroNIDAZOLE (FLAGYL) IVPB 500 mg        500 mg 100 mL/hr over 60 Minutes Intravenous  Once 12/29/20 1650 12/29/20 1839   12/29/20 1700  vancomycin (VANCOCIN) IVPB 1000 mg/200 mL premix        1,000 mg 200 mL/hr over 60 Minutes Intravenous  Once 12/29/20 1650 12/29/20 2012     Subjective: Today, patient was seen and examined at bedside.   Denies any pain cough fever chills or rigor.  Was eating okay with assistance.  Objective: Vitals:   01/04/21 0800 01/04/21 1000  BP: (!) 110/53 (!) 110/56  Pulse: 83 80  Resp: 19 18  Temp: 98.4 F (36.9 C)   SpO2: 95% 96%    Intake/Output Summary (Last 24 hours) at 01/04/2021 1024 Last data filed at 01/04/2021 1019 Gross per 24 hour  Intake 480 ml  Output 1450 ml  Net -970 ml   Filed Weights   12/30/20 0500 01/02/21 0500 01/03/21 0500  Weight: 98.6 kg  110.7 kg 107.3 kg   Body mass index is 29.57 kg/m.   Physical Exam: General:  Average built, not in obvious distress alert awake oriented, dysarthric HENT:   No scleral pallor or icterus noted. Oral mucosa is moist.  Chest:  Clear breath sounds.  Diminished breath sounds bilaterally. No crackles or wheezes.  CVS: S1 &S2 heard. No murmur.  Regular rate and rhythm. Abdomen: Soft, nontender, nondistended.  Bowel sounds are heard.   Extremities: Right upper extremity with atrophy and mild weakness.  Bilateral lower extremity weakness. Psych: Alert, awake and oriented, normal mood CNS: Mild dysarthria.  Right upper extremity spasticity, bilateral lower extremity weakness.   Skin: Warm and dry.    Data Review: I have personally reviewed the following laboratory data and studies,  CBC: Recent Labs  Lab 12/29/20 1635 12/29/20 1857 12/30/20 0248 12/31/20 0343 01/01/21 0300 01/02/21 0412 01/04/21 0240  WBC 4.0  --  38.3* 40.0* 48.7* 53.4* 19.5*  NEUTROABS 3.8  --   --   --   --  48.8*  --   HGB 11.9*   < > 10.9* 9.7* 9.6* 9.6* 10.5*  HCT 37.8*   < > 32.3* 27.9* 28.1* 28.7* 31.7*  MCV 87.5  --  82.8 80.6 81.7 82.9 84.5  PLT 250  --  161 90* 64* 72* 125*   < > = values in this interval not displayed.   Basic Metabolic Panel: Recent Labs  Lab 12/31/20 0343 01/01/21 0300 01/02/21 0412 01/03/21 0511 01/04/21 0240  NA 132* 133* 146* 141 139  K 3.5 3.0* 3.4* 4.5 2.9*  CL 100 104 114* 113* 108  CO2 21* 21* 23 21* 23   GLUCOSE 118* 157* 116* 101* 83  BUN 19 19 14  21* 19  CREATININE 1.35* 1.03 1.15 1.10 1.12  CALCIUM 7.4* 7.5* 8.4* 8.3* 8.2*  MG  --   --   --   --  1.7  PHOS  --   --   --   --  2.8   Liver Function Tests: Recent Labs  Lab 12/29/20 1635 01/02/21 0412 01/04/21 0240  AST 25 30 39  ALT 38 33 52*  ALKPHOS 96 132* 116  BILITOT 0.6 0.7 0.8  PROT 6.0* 4.9* 5.0*  ALBUMIN 3.2* 2.2* 2.4*   No results for input(s): LIPASE, AMYLASE in the last 168 hours. No results for input(s): AMMONIA in the last 168 hours. Cardiac Enzymes: No results for input(s): CKTOTAL, CKMB, CKMBINDEX, TROPONINI in the last 168 hours. BNP (last 3 results) No results for input(s): BNP in the last 8760 hours.  ProBNP (last 3 results) No results for input(s): PROBNP in the last 8760 hours.  CBG: Recent Labs  Lab 01/03/21 1559 01/03/21 2012 01/03/21 2341 01/04/21 0344 01/04/21 0831  GLUCAP 89 89 86 83 84   Recent Results (from the past 240 hour(s))  Urine culture     Status: None   Collection Time: 12/29/20  4:34 PM   Specimen: In/Out Cath Urine  Result Value Ref Range Status   Specimen Description   Final    IN/OUT CATH URINE Performed at [facility], [address] [place], [place] [address]    Special Requests   Final    NONE Performed at [facility], [address] [place], [place] [address]    Culture   Final    NO GROWTH Performed at [facility], [address] [place], [place] [address]    Report Status 12/31/2020 FINAL  Final  Blood culture (routine single)     Status: Abnormal   Collection Time: 12/29/20  4:35 PM   Specimen: Left Antecubital; Blood  Result Value Ref Range Status   Specimen Description   Final    LEFT ANTECUBITAL BOTTLES DRAWN AEROBIC AND ANAEROBIC   Special Requests Blood Culture adequate volume  Final   Culture  Setup Time   Final    GRAM NEGATIVE RODS ANAEROBIC BOTTLE Gram Stain Report Called to,Read Back By and Verified With: HEAVNER,A@1211  BY  MATTHEWS,B 4.4.22 PT @[facility]   Organism ID to follow GRAM POSITIVE COCCI ANAEROBIC BOTTLE ONLY CRITICAL RESULT CALLED TO, READ BACK BY AND VERIFIED WITH: PHARMD MELISSA M. 2002 [id number] FCP    Culture ESCHERICHIA COLI ENTEROCOCCUS FAECALIS  (A)  Final   Report Status 01/02/2021 FINAL  Final   Organism ID, Bacteria ESCHERICHIA COLI  Final   Organism ID, Bacteria ENTEROCOCCUS FAECALIS  Final      Susceptibility   Escherichia coli - MIC*    AMPICILLIN 16 INTERMEDIATE Intermediate     CEFAZOLIN <=4 SENSITIVE Sensitive     CEFEPIME <=0.12 SENSITIVE Sensitive     CEFTAZIDIME <=1 SENSITIVE Sensitive     CEFTRIAXONE <=0.25 SENSITIVE Sensitive     CIPROFLOXACIN <=0.25 SENSITIVE Sensitive     GENTAMICIN <=1 SENSITIVE Sensitive     IMIPENEM <=0.25 SENSITIVE Sensitive     TRIMETH/SULFA >=320 RESISTANT Resistant     AMPICILLIN/SULBACTAM 4 SENSITIVE Sensitive     PIP/TAZO <=4 SENSITIVE Sensitive     * ESCHERICHIA COLI   Enterococcus faecalis - MIC*    AMPICILLIN <=2 SENSITIVE Sensitive     VANCOMYCIN 1 SENSITIVE Sensitive     GENTAMICIN SYNERGY SENSITIVE Sensitive     LINEZOLID Value in next row Sensitive      SENSITIVE2Performed at [facility], [address], [place], [place] [address]    * ENTEROCOCCUS FAECALIS  Blood Culture ID Panel (Reflexed)     Status: Abnormal   Collection Time: 12/29/20  4:35 PM  Result Value Ref Range Status   Enterococcus faecalis DETECTED (A) NOT DETECTED Final    Comment: CRITICAL RESULT CALLED TO, READ BACK BY AND VERIFIED WITH: PHARMD MELISSA M. 2002 [id number] FCP    Enterococcus Faecium NOT DETECTED NOT DETECTED Final   Listeria monocytogenes NOT DETECTED NOT DETECTED Final   Staphylococcus species NOT DETECTED NOT DETECTED Final   Staphylococcus aureus (BCID) NOT DETECTED NOT DETECTED Final   Staphylococcus epidermidis NOT DETECTED NOT DETECTED Final   Staphylococcus lugdunensis NOT DETECTED NOT DETECTED Final   Streptococcus species NOT DETECTED NOT  DETECTED Final   Streptococcus agalactiae NOT DETECTED NOT DETECTED Final   Streptococcus pneumoniae NOT DETECTED NOT DETECTED Final   Streptococcus pyogenes NOT DETECTED NOT DETECTED Final   A.calcoaceticus-baumannii NOT DETECTED NOT DETECTED Final   Bacteroides fragilis NOT DETECTED NOT DETECTED Final   Enterobacterales DETECTED (A) NOT DETECTED Final    Comment: Enterobacterales represent a large order of gram negative bacteria, not a single organism. CRITICAL RESULT CALLED TO, READ BACK BY AND VERIFIED WITH: PHARMD MELISSA M. 2002 [id number] FCP    Enterobacter cloacae complex NOT DETECTED NOT DETECTED Final   Escherichia coli DETECTED (A) NOT DETECTED Final    Comment: CRITICAL RESULT CALLED TO, READ BACK BY AND VERIFIED WITH: PHARMD MELISSA M. 2002 [id number] FCP    Klebsiella aerogenes NOT DETECTED NOT DETECTED Final   Klebsiella oxytoca NOT DETECTED NOT DETECTED Final   Klebsiella pneumoniae NOT DETECTED NOT DETECTED Final  Proteus species NOT DETECTED NOT DETECTED Final   Salmonella species NOT DETECTED NOT DETECTED Final   Serratia marcescens NOT DETECTED NOT DETECTED Final   Haemophilus influenzae NOT DETECTED NOT DETECTED Final   Neisseria meningitidis NOT DETECTED NOT DETECTED Final   Pseudomonas aeruginosa NOT DETECTED NOT DETECTED Final   Stenotrophomonas maltophilia NOT DETECTED NOT DETECTED Final   Candida albicans NOT DETECTED NOT DETECTED Final   Candida auris NOT DETECTED NOT DETECTED Final   Candida glabrata NOT DETECTED NOT DETECTED Final   Candida krusei NOT DETECTED NOT DETECTED Final   Candida parapsilosis NOT DETECTED NOT DETECTED Final   Candida tropicalis NOT DETECTED NOT DETECTED Final   Cryptococcus neoformans/gattii NOT DETECTED NOT DETECTED Final   CTX-M ESBL NOT DETECTED NOT DETECTED Final   Carbapenem resistance IMP NOT DETECTED NOT DETECTED Final   Carbapenem resistance KPC NOT DETECTED NOT DETECTED Final   Carbapenem resistance NDM NOT DETECTED NOT  DETECTED Final   Carbapenem resist OXA 48 LIKE NOT DETECTED NOT DETECTED Final   Vancomycin resistance NOT DETECTED NOT DETECTED Final   Carbapenem resistance VIM NOT DETECTED NOT DETECTED Final    Comment: Performed at [facility], [address] [place], [place] [address]  Culture, blood (single)     Status: None   Collection Time: 12/29/20  6:50 PM   Specimen: A-Line; Blood  Result Value Ref Range Status   Specimen Description A-LINE CENTRAL LINE  Final   Special Requests   Final    Blood Culture results may not be optimal due to an excessive volume of blood received in culture bottles BOTTLES DRAWN AEROBIC AND ANAEROBIC   Culture   Final    NO GROWTH 5 DAYS Performed at [facility], [address], [place], [place] [address]    Report Status 01/03/2021 FINAL  Final  MRSA PCR Screening     Status: None   Collection Time: 12/29/20 11:36 PM   Specimen: Nasopharyngeal  Result Value Ref Range Status   MRSA by PCR NEGATIVE NEGATIVE Final    Comment:        The GeneXpert MRSA Assay (FDA approved for NASAL specimens only), is one component of a comprehensive MRSA colonization surveillance program. It is not intended to diagnose MRSA infection nor to guide or monitor treatment for MRSA infections. Performed at [facility], [address] [place], [place] [address]   Culture, blood (routine x 2)     Status: None (Preliminary result)   Collection Time: 12/31/20  4:01 PM   Specimen: BLOOD  Result Value Ref Range Status   Specimen Description   Final    BLOOD RIGHT HAND Performed at [facility], [address], [place], [place] [address]    Special Requests   Final    BOTTLES DRAWN AEROBIC AND ANAEROBIC Blood Culture adequate volume Performed at [facility], [address], [place], [place] [address]    Culture   Final    NO GROWTH 3 DAYS Performed at [facility], [address] [place], [place]  [address]    Report Status PENDING  Incomplete  Culture, blood (routine x 2)     Status: None (Preliminary result)   Collection Time: 12/31/20  4:07 PM   Specimen: BLOOD  Result Value Ref Range Status   Specimen Description   Final    BLOOD RIGHT HAND Performed at [facility], [address], [place], [place] [address]  Special Requests   Final    BOTTLES DRAWN AEROBIC AND ANAEROBIC Blood Culture results may not be optimal due to an inadequate volume of blood received in culture bottles Performed at [facility], [address], [place], [place] [address]    Culture   Final    NO GROWTH 3 DAYS Performed at [facility], [address], [place], [place] [address]    Report Status PENDING  Incomplete     Studies: No results found.   Joycelyn Das, MD  [facility] 01/04/2021  If 7PM-7AM, please contact night-coverage

## 2021-01-05 DIAGNOSIS — R310 Gross hematuria: Secondary | ICD-10-CM

## 2021-01-05 DIAGNOSIS — J9601 Acute respiratory failure with hypoxia: Secondary | ICD-10-CM

## 2021-01-05 LAB — CULTURE, BLOOD (ROUTINE X 2)
Culture: NO GROWTH
Culture: NO GROWTH
Special Requests: ADEQUATE

## 2021-01-05 LAB — GLUCOSE, CAPILLARY
Glucose-Capillary: 102 mg/dL — ABNORMAL HIGH (ref 70–99)
Glucose-Capillary: 88 mg/dL (ref 70–99)
Glucose-Capillary: 90 mg/dL (ref 70–99)
Glucose-Capillary: 110 mg/dL — ABNORMAL HIGH (ref 70–99)
Glucose-Capillary: 122 mg/dL — ABNORMAL HIGH (ref 70–99)

## 2021-01-05 MED ORDER — RISAQUAD PO CAPS
2.0000 | ORAL_CAPSULE | Freq: Three times a day (TID) | ORAL | Status: DC
  Administered 2021-01-05 – 2021-01-06 (×6): 2 via ORAL
  Filled 2021-01-05 (×6): qty 2

## 2021-01-05 MED ORDER — SIMETHICONE 40 MG/0.6ML PO SUSP
40.0000 mg | Freq: Four times a day (QID) | ORAL | Status: DC | PRN
  Filled 2021-01-05: qty 0.6

## 2021-01-05 MED ORDER — ENOXAPARIN SODIUM 40 MG/0.4ML ~~LOC~~ SOLN
40.0000 mg | SUBCUTANEOUS | Status: DC
  Administered 2021-01-05 – 2021-01-06 (×2): 40 mg via SUBCUTANEOUS
  Filled 2021-01-05 (×2): qty 0.4

## 2021-01-05 NOTE — Plan of Care (Signed)
  Problem: Education: Goal: Knowledge of General Education information will improve Description: Including pain rating scale, medication(s)/side effects and non-pharmacologic comfort measures Outcome: Progressing   Problem: Clinical Measurements: Goal: Respiratory complications will improve Outcome: Progressing   Problem: Nutrition: Goal: Adequate nutrition will be maintained Outcome: Progressing   Problem: Coping: Goal: Level of anxiety will decrease Outcome: Progressing   Problem: Elimination: Goal: Will not experience complications related to urinary retention Outcome: Progressing   Problem: Pain Managment: Goal: General experience of comfort will improve Outcome: Progressing   

## 2021-01-05 NOTE — Progress Notes (Signed)
Per day nurse achs and d/c cont. Pulse ox. Awaiting order placement.

## 2021-01-05 NOTE — Progress Notes (Signed)
Pt was notified of transfer to 4W. Pt's belongings were packed and grabbed pt's chart. Pt vitals were stable and pt comfortable for travel. Pt was placed in 4W bed and RN was notified.

## 2021-01-05 NOTE — Progress Notes (Signed)
PROGRESS NOTE  Vincent Anthony ALP:[id number] DOB: [birth year] DOA: 12/29/2020 PCP: [name], MD   LOS: 7 days   Brief narrative:  Patient is a 50 years old male with history of multiple sclerosis and overactive bladder on indwelling Foley catheter for 1 month presented initially to [facility] from [facility] after traumatic Foley exchange attempt with hematuria.  Patient had a new Foley catheter placed on 08/30/2021 but did not have any urinary output and had profuse bleeding.  They tried continuous bladder irrigation but unable to flush so patient ended up being in the hospital.  In the ED, was noted to have fever dyspnea hypotension and tachycardia with brief loss of consciousness.  He was thought to have sepsis secondary to instrumentation and was started on vancomycin Flagyl.  He was then admitted to the ICU for septic shock.   Patient was subsequently considered stable in the ICU and was weaned off vasopressors.  He was then transferred out of the ICU.   Assessment/Plan:  Principal Problem:   Septic shock (HCC) Active Problems:   MS (multiple sclerosis) (HCC)   Neurogenic bladder   Bacteremia   Immunosuppressed status (HCC)  Septic Shock secondary to E-coli and possible GPC Bacteremia,  Sepsis physiology now resolved. Patient was vasopressors has been weaned off.  Currently stable.  Patient does have history of E. coli UTI in 11/21.  Completed Keflex.  Continue and linezolid total of 14 days- end date of linezolid 4/17.  T-max of 98.29F  Leukocytosis Trending down.  Off steroids.  On antibiotics.  Check CBC in a.m. Marland Kitchen   CBC Latest Ref Rng & Units 01/04/2021 01/02/2021 01/01/2021  WBC 4.0 - 10.5 K/uL 19.5(H) 53.4(HH) 48.7(H)  Hemoglobin 13.0 - 17.0 g/dL 10.5(L) 9.6(L) 9.6(L)  Hematocrit 39.0 - 52.0 % 31.7(L) 28.7(L) 28.1(L)  Platelets 150 - 400 K/uL 125(L) 72(L) 64(L)    Hematuria secondary to Malpositioned Foley  Resolved.  Continue Foley catheter.  No  hydronephrosis.  Will need to follow-up with his primary urologist after discharge.  Ok for facility RN to change catheter but need to be aware of 58ml balloon.  Bloating, abdominal distention.  Patient does have chronic distention at baseline.  We will add simethicone, probiotic  Urinary Retention  Continue Foley catheter.  Acute kidney injury, improved  Creatinine of 1.1.  At baseline.  Has improved.  Encourage oral intake.  Check BMP in AM.  Questionable Seizure  On Keppra long-term for adjuvant pain control.  No history of seizures in the past.    Anemia Continue to monitor.  Transfuse if necessary.  Latest hemoglobin of 10.5.  Check CBC in a.m.  Diabetes mellitus type 2. Continue sliding scale insulin, Accu-Cheks, diabetic diet.  Patient is on Metformin at home which is on hold.  Latest POC glucose of 88  History of multiple sclerosis. Continue Lyrica and baclofen.  Physical therapy occupational therapy has seen the patient and recommended skilled nursing facility on discharge  Hyperlipidemia. Continue statins.   Constipation Continue Linzess  Hypokalemia.  will replace.  Check levels in a.m.  Anxiety  On Xanax  DVT prophylaxis: SCDs Start: 12/30/20 0240  Code Status: Full code  Family Communication:  None today.  I spoke with the patient's spouse Ms [name] on the phone and updated her about the clinical condition of the patient yesterday.  Status is: Inpatient  Remains inpatient appropriate because:IV treatments appropriate due to intensity of illness or inability to take PO and Inpatient level of  care appropriate due to severity of illness  Dispo: The patient is from: Skilled nursing facility              Anticipated d/c is to: Skilled nursing facility when bed is available              Patient currently is medically stable to d/c.   Difficult to place patient No  Consultants:  Infectious disease,   PCCM  Urology  Procedures:  Foley catheter  placement,   central line placement and removal.  Anti-infectives:  . Keflex 01/02/21> . Zyvox 01/02/21>  Anti-infectives (From admission, onward)   Start     Dose/Rate Route Frequency Ordered Stop   01/02/21 1200  cephALEXin (KEFLEX) capsule 500 mg        500 mg Oral Every 6 hours 01/02/21 1032 01/04/21 1807   01/02/21 1130  linezolid (ZYVOX) tablet 600 mg        600 mg Oral Every 12 hours 01/02/21 1032 01/15/21 0959   01/01/21 1400  ceFAZolin (ANCEF) IVPB 2g/100 mL premix  Status:  Discontinued        2 g 200 mL/hr over 30 Minutes Intravenous Every 8 hours 01/01/21 1135 01/01/21 1157   01/01/21 1400  Ampicillin-Sulbactam (UNASYN) 3 g in sodium chloride 0.9 % 100 mL IVPB  Status:  Discontinued        3 g 200 mL/hr over 30 Minutes Intravenous Every 6 hours 01/01/21 1157 01/02/21 1032   01/01/21 1200  vancomycin (VANCOREADY) IVPB 1500 mg/300 mL  Status:  Discontinued        1,500 mg 150 mL/hr over 120 Minutes Intravenous Every 12 hours 01/01/21 0958 01/01/21 1157   12/31/20 2000  vancomycin (VANCOREADY) IVPB 2000 mg/400 mL  Status:  Discontinued        2,000 mg 200 mL/hr over 120 Minutes Intravenous Every 24 hours 12/30/20 0212 12/31/20 0732   12/31/20 0800  vancomycin (VANCOREADY) IVPB 1250 mg/250 mL  Status:  Discontinued        1,250 mg 166.7 mL/hr over 90 Minutes Intravenous Every 12 hours 12/31/20 0732 01/01/21 0958   12/30/20 0400  ceFEPIme (MAXIPIME) 2 g in sodium chloride 0.9 % 100 mL IVPB  Status:  Discontinued        2 g 200 mL/hr over 30 Minutes Intravenous Every 8 hours 12/30/20 0212 01/01/21 1135   12/29/20 1930  vancomycin (VANCOCIN) IVPB 1000 mg/200 mL premix        1,000 mg 200 mL/hr over 60 Minutes Intravenous  Once 12/29/20 1855 12/29/20 2012   12/29/20 1700  ceFEPIme (MAXIPIME) 2 g in sodium chloride 0.9 % 100 mL IVPB        2 g 200 mL/hr over 30 Minutes Intravenous  Once 12/29/20 1650 12/29/20 1807   12/29/20 1700  metroNIDAZOLE (FLAGYL) IVPB 500 mg        500  mg 100 mL/hr over 60 Minutes Intravenous  Once 12/29/20 1650 12/29/20 1839   12/29/20 1700  vancomycin (VANCOCIN) IVPB 1000 mg/200 mL premix        1,000 mg 200 mL/hr over 60 Minutes Intravenous  Once 12/29/20 1650 12/29/20 2012     Subjective: Today, patient was seen and examined at bedside.  Patient denies any nausea vomiting fever chills or rigor.  Complains of mild abdominal discomfort.  Feels bloated.  Has had few episodes of diarrhea.  Objective: Vitals:   01/05/21 1100 01/05/21 1120  BP: 131/66 131/73  Pulse: 83 87  Resp:  18  Temp:  98.3 F (36.8 C)  SpO2: 95% 98%    Intake/Output Summary (Last 24 hours) at 01/05/2021 1156 Last data filed at 01/05/2021 0800 Gross per 24 hour  Intake 634.86 ml  Output 3275 ml  Net -2640.14 ml   Filed Weights   12/30/20 0500 01/02/21 0500 01/03/21 0500  Weight: 98.6 kg 110.7 kg 107.3 kg   Body mass index is 29.57 kg/m.   Physical Exam: General:  Average built, not in obvious distress alert awake oriented, dysarthric HENT:   No scleral pallor or icterus noted. Oral mucosa is moist.  Chest:  Clear breath sounds.  Diminished breath sounds bilaterally. No crackles or wheezes.  CVS: S1 &S2 heard. No murmur.  Regular rate and rhythm. Abdomen: Soft, nontender, distended and tympanic.  Bowel sounds are heard.   Extremities: Right upper extremity with atrophy and mild weakness.  Bilateral lower extremity weakness. Psych: Alert, awake and oriented, normal mood CNS: Mild dysarthria.  Right upper extremity spasticity, bilateral lower extremity weakness.   Skin: Warm and dry.    Data Review: I have personally reviewed the following laboratory data and studies,  CBC: Recent Labs  Lab 12/29/20 1635 12/29/20 1857 12/30/20 0248 12/31/20 0343 01/01/21 0300 01/02/21 0412 01/04/21 0240  WBC 4.0  --  38.3* 40.0* 48.7* 53.4* 19.5*  NEUTROABS 3.8  --   --   --   --  48.8*  --   HGB 11.9*   < > 10.9* 9.7* 9.6* 9.6* 10.5*  HCT 37.8*   < >  32.3* 27.9* 28.1* 28.7* 31.7*  MCV 87.5  --  82.8 80.6 81.7 82.9 84.5  PLT 250  --  161 90* 64* 72* 125*   < > = values in this interval not displayed.   Basic Metabolic Panel: Recent Labs  Lab 12/31/20 0343 01/01/21 0300 01/02/21 0412 01/03/21 0511 01/04/21 0240  NA 132* 133* 146* 141 139  K 3.5 3.0* 3.4* 4.5 2.9*  CL 100 104 114* 113* 108  CO2 21* 21* 23 21* 23  GLUCOSE 118* 157* 116* 101* 83  BUN 19 19 14  21* 19  CREATININE 1.35* 1.03 1.15 1.10 1.12  CALCIUM 7.4* 7.5* 8.4* 8.3* 8.2*  MG  --   --   --   --  1.7  PHOS  --   --   --   --  2.8   Liver Function Tests: Recent Labs  Lab 12/29/20 1635 01/02/21 0412 01/04/21 0240  AST 25 30 39  ALT 38 33 52*  ALKPHOS 96 132* 116  BILITOT 0.6 0.7 0.8  PROT 6.0* 4.9* 5.0*  ALBUMIN 3.2* 2.2* 2.4*   No results for input(s): LIPASE, AMYLASE in the last 168 hours. No results for input(s): AMMONIA in the last 168 hours. Cardiac Enzymes: No results for input(s): CKTOTAL, CKMB, CKMBINDEX, TROPONINI in the last 168 hours. BNP (last 3 results) No results for input(s): BNP in the last 8760 hours.  ProBNP (last 3 results) No results for input(s): PROBNP in the last 8760 hours.  CBG: Recent Labs  Lab 01/04/21 1639 01/04/21 1958 01/04/21 2322 01/05/21 0414 01/05/21 0806  GLUCAP 87 119* 128* 102* 88   Recent Results (from the past 240 hour(s))  Urine culture     Status: None   Collection Time: 12/29/20  4:34 PM   Specimen: In/Out Cath Urine  Result Value Ref Range Status   Specimen Description   Final    IN/OUT CATH URINE Performed at [facility],  [address], [place], [place] [address]    Special Requests   Final    NONE Performed at [facility], [address] [place], [place] [address]    Culture   Final    NO GROWTH Performed at [facility], [address], [place], [place] [address]    Report Status 12/31/2020 FINAL  Final  Blood culture (routine single)     Status: Abnormal   Collection Time:  12/29/20  4:35 PM   Specimen: Left Antecubital; Blood  Result Value Ref Range Status   Specimen Description   Final    LEFT ANTECUBITAL BOTTLES DRAWN AEROBIC AND ANAEROBIC   Special Requests Blood Culture adequate volume  Final   Culture  Setup Time   Final    GRAM NEGATIVE RODS ANAEROBIC BOTTLE Gram Stain Report Called to,Read Back By and Verified With: HEAVNER,A@1211  BY MATTHEWS,B 4.4.22 PT @[facility]   Organism ID to follow GRAM POSITIVE COCCI ANAEROBIC BOTTLE ONLY CRITICAL RESULT CALLED TO, READ BACK BY AND VERIFIED WITH: PHARMD MELISSA M. 2002 [id number] FCP    Culture ESCHERICHIA COLI ENTEROCOCCUS FAECALIS  (A)  Final   Report Status 01/02/2021 FINAL  Final   Organism ID, Bacteria ESCHERICHIA COLI  Final   Organism ID, Bacteria ENTEROCOCCUS FAECALIS  Final      Susceptibility   Escherichia coli - MIC*    AMPICILLIN 16 INTERMEDIATE Intermediate     CEFAZOLIN <=4 SENSITIVE Sensitive     CEFEPIME <=0.12 SENSITIVE Sensitive     CEFTAZIDIME <=1 SENSITIVE Sensitive     CEFTRIAXONE <=0.25 SENSITIVE Sensitive     CIPROFLOXACIN <=0.25 SENSITIVE Sensitive     GENTAMICIN <=1 SENSITIVE Sensitive     IMIPENEM <=0.25 SENSITIVE Sensitive     TRIMETH/SULFA >=320 RESISTANT Resistant     AMPICILLIN/SULBACTAM 4 SENSITIVE Sensitive     PIP/TAZO <=4 SENSITIVE Sensitive     * ESCHERICHIA COLI   Enterococcus faecalis - MIC*    AMPICILLIN <=2 SENSITIVE Sensitive     VANCOMYCIN 1 SENSITIVE Sensitive     GENTAMICIN SYNERGY SENSITIVE Sensitive     LINEZOLID Value in next row Sensitive      SENSITIVE2Performed at [facility], [address], [place], [place] [address]    * ENTEROCOCCUS FAECALIS  Blood Culture ID Panel (Reflexed)     Status: Abnormal   Collection Time: 12/29/20  4:35 PM  Result Value Ref Range Status   Enterococcus faecalis DETECTED (A) NOT DETECTED Final    Comment: CRITICAL RESULT CALLED TO, READ BACK BY AND VERIFIED WITH: PHARMD MELISSA M. 2002 [id number] FCP    Enterococcus  Faecium NOT DETECTED NOT DETECTED Final   Listeria monocytogenes NOT DETECTED NOT DETECTED Final   Staphylococcus species NOT DETECTED NOT DETECTED Final   Staphylococcus aureus (BCID) NOT DETECTED NOT DETECTED Final   Staphylococcus epidermidis NOT DETECTED NOT DETECTED Final   Staphylococcus lugdunensis NOT DETECTED NOT DETECTED Final   Streptococcus species NOT DETECTED NOT DETECTED Final   Streptococcus agalactiae NOT DETECTED NOT DETECTED Final   Streptococcus pneumoniae NOT DETECTED NOT DETECTED Final   Streptococcus pyogenes NOT DETECTED NOT DETECTED Final   A.calcoaceticus-baumannii NOT DETECTED NOT DETECTED Final   Bacteroides fragilis NOT DETECTED NOT DETECTED Final   Enterobacterales DETECTED (A) NOT DETECTED Final    Comment: Enterobacterales represent a large order of gram negative bacteria, not a single organism. CRITICAL RESULT CALLED TO, READ BACK BY AND VERIFIED WITH: PHARMD MELISSA M. 2002 [id number] FCP    Enterobacter cloacae  complex NOT DETECTED NOT DETECTED Final   Escherichia coli DETECTED (A) NOT DETECTED Final    Comment: CRITICAL RESULT CALLED TO, READ BACK BY AND VERIFIED WITH: PHARMD MELISSA M. 2002 [id number] FCP    Klebsiella aerogenes NOT DETECTED NOT DETECTED Final   Klebsiella oxytoca NOT DETECTED NOT DETECTED Final   Klebsiella pneumoniae NOT DETECTED NOT DETECTED Final   Proteus species NOT DETECTED NOT DETECTED Final   Salmonella species NOT DETECTED NOT DETECTED Final   Serratia marcescens NOT DETECTED NOT DETECTED Final   Haemophilus influenzae NOT DETECTED NOT DETECTED Final   Neisseria meningitidis NOT DETECTED NOT DETECTED Final   Pseudomonas aeruginosa NOT DETECTED NOT DETECTED Final   Stenotrophomonas maltophilia NOT DETECTED NOT DETECTED Final   Candida albicans NOT DETECTED NOT DETECTED Final   Candida auris NOT DETECTED NOT DETECTED Final   Candida glabrata NOT DETECTED NOT DETECTED Final   Candida krusei NOT DETECTED NOT DETECTED Final    Candida parapsilosis NOT DETECTED NOT DETECTED Final   Candida tropicalis NOT DETECTED NOT DETECTED Final   Cryptococcus neoformans/gattii NOT DETECTED NOT DETECTED Final   CTX-M ESBL NOT DETECTED NOT DETECTED Final   Carbapenem resistance IMP NOT DETECTED NOT DETECTED Final   Carbapenem resistance KPC NOT DETECTED NOT DETECTED Final   Carbapenem resistance NDM NOT DETECTED NOT DETECTED Final   Carbapenem resist OXA 48 LIKE NOT DETECTED NOT DETECTED Final   Vancomycin resistance NOT DETECTED NOT DETECTED Final   Carbapenem resistance VIM NOT DETECTED NOT DETECTED Final    Comment: Performed at [facility], [address], [place], [place] [address]  Culture, blood (single)     Status: None   Collection Time: 12/29/20  6:50 PM   Specimen: A-Line; Blood  Result Value Ref Range Status   Specimen Description A-LINE CENTRAL LINE  Final   Special Requests   Final    Blood Culture results may not be optimal due to an excessive volume of blood received in culture bottles BOTTLES DRAWN AEROBIC AND ANAEROBIC   Culture   Final    NO GROWTH 5 DAYS Performed at [facility], [address] [place], [place] [address]    Report Status 01/03/2021 FINAL  Final  MRSA PCR Screening     Status: None   Collection Time: 12/29/20 11:36 PM   Specimen: Nasopharyngeal  Result Value Ref Range Status   MRSA by PCR NEGATIVE NEGATIVE Final    Comment:        The GeneXpert MRSA Assay (FDA approved for NASAL specimens only), is one component of a comprehensive MRSA colonization surveillance program. It is not intended to diagnose MRSA infection nor to guide or monitor treatment for MRSA infections. Performed at [facility], [address], [place], [place] [address]   Culture, blood (routine x 2)     Status: None   Collection Time: 12/31/20  4:01 PM   Specimen: BLOOD  Result Value Ref Range Status   Specimen Description   Final    BLOOD RIGHT HAND Performed at [facility], [address], [place], [place] [address]    Special Requests   Final    BOTTLES DRAWN AEROBIC AND ANAEROBIC Blood Culture adequate volume Performed at [facility], [address], [place], [place] [address]    Culture   Final    NO GROWTH 5 DAYS Performed at [facility], [address] [place], [place] [address]    Report Status  01/05/2021 FINAL  Final  Culture, blood (routine x 2)     Status: None   Collection Time: 12/31/20  4:07 PM   Specimen: BLOOD  Result Value Ref Range Status   Specimen Description   Final    BLOOD RIGHT HAND Performed at [facility], [address] [place], [place] [address]    Special Requests   Final    BOTTLES DRAWN AEROBIC AND ANAEROBIC Blood Culture results may not be optimal due to an inadequate volume of blood received in culture bottles Performed at [facility], [address] [place], [place] [address]    Culture   Final    NO GROWTH 5 DAYS Performed at [facility], [address] [place], [place] [address]    Report Status 01/05/2021 FINAL  Final     Studies: No results found.   Joycelyn DasLaxman Chandani Rogowski, MD  [facility] 01/05/2021  If 7PM-7AM, please contact night-coverage

## 2021-01-06 LAB — CBC
HCT: 27.9 % — ABNORMAL LOW (ref 39.0–52.0)
Hemoglobin: 9.1 g/dL — ABNORMAL LOW (ref 13.0–17.0)
MCH: 27.4 pg (ref 26.0–34.0)
MCHC: 32.6 g/dL (ref 30.0–36.0)
MCV: 84 fL (ref 80.0–100.0)
Platelets: 177 10*3/uL (ref 150–400)
RBC: 3.32 MIL/uL — ABNORMAL LOW (ref 4.22–5.81)
RDW: 18.5 % — ABNORMAL HIGH (ref 11.5–15.5)
WBC: 17.3 10*3/uL — ABNORMAL HIGH (ref 4.0–10.5)
nRBC: 0 % (ref 0.0–0.2)

## 2021-01-06 LAB — BASIC METABOLIC PANEL
Anion gap: 9 (ref 5–15)
BUN: 8 mg/dL (ref 6–20)
CO2: 24 mmol/L (ref 22–32)
Calcium: 8.2 mg/dL — ABNORMAL LOW (ref 8.9–10.3)
Chloride: 106 mmol/L (ref 98–111)
Creatinine, Ser: 0.86 mg/dL (ref 0.61–1.24)
GFR, Estimated: >60 mL/min (ref >60)
Glucose, Bld: 115 mg/dL — ABNORMAL HIGH (ref 70–99)
Potassium: 3.1 mmol/L — ABNORMAL LOW (ref 3.5–5.1)
Sodium: 139 mmol/L (ref 135–145)

## 2021-01-06 LAB — MAGNESIUM: Magnesium: 1.7 mg/dL (ref 1.7–2.4)

## 2021-01-06 LAB — RESP PANEL BY RT-PCR (FLU A&B, COVID) ARPGX2
Influenza A by PCR: NEGATIVE
Influenza B by PCR: NEGATIVE
SARS Coronavirus 2 by RT PCR: NEGATIVE

## 2021-01-06 LAB — GLUCOSE, CAPILLARY
Glucose-Capillary: 103 mg/dL — ABNORMAL HIGH (ref 70–99)
Glucose-Capillary: 104 mg/dL — ABNORMAL HIGH (ref 70–99)
Glucose-Capillary: 104 mg/dL — ABNORMAL HIGH (ref 70–99)
Glucose-Capillary: 105 mg/dL — ABNORMAL HIGH (ref 70–99)
Glucose-Capillary: 127 mg/dL — ABNORMAL HIGH (ref 70–99)

## 2021-01-06 MED ORDER — SIMETHICONE 40 MG/0.6ML PO SUSP: 40.0000 mg | Freq: Four times a day (QID) | ORAL | 0 refills | Status: DC

## 2021-01-06 MED ORDER — MAGNESIUM OXIDE 400 (241.3 MG) MG PO TABS: 400.0000 mg | ORAL_TABLET | Freq: Two times a day (BID) | ORAL | 0 refills | Status: AC

## 2021-01-06 MED ORDER — LUBIPROSTONE 8 MCG PO CAPS: 8.0000 ug | ORAL_CAPSULE | Freq: Two times a day (BID) | ORAL | Status: DC

## 2021-01-06 MED ORDER — POTASSIUM CHLORIDE CRYS ER 20 MEQ PO TBCR: 20.0000 meq | EXTENDED_RELEASE_TABLET | Freq: Every day | ORAL | 0 refills | Status: DC

## 2021-01-06 MED ORDER — ONDANSETRON HCL 4 MG PO TABS: 4.0000 mg | ORAL_TABLET | Freq: Three times a day (TID) | ORAL | 1 refills | Status: AC | PRN

## 2021-01-06 MED ORDER — POTASSIUM CHLORIDE CRYS ER 20 MEQ PO TBCR
40.0000 meq | EXTENDED_RELEASE_TABLET | Freq: Once | ORAL | Status: AC
  Administered 2021-01-06: 40 meq via ORAL
  Filled 2021-01-06: qty 2

## 2021-01-06 MED ORDER — POTASSIUM CHLORIDE CRYS ER 20 MEQ PO TBCR
30.0000 meq | EXTENDED_RELEASE_TABLET | Freq: Once | ORAL | Status: AC
  Administered 2021-01-06: 30 meq via ORAL
  Filled 2021-01-06: qty 1

## 2021-01-06 MED ORDER — SENNOSIDES 8.6 MG PO TABS: 2.0000 | ORAL_TABLET | Freq: Two times a day (BID) | ORAL | Status: DC

## 2021-01-06 MED ORDER — LINEZOLID 600 MG PO TABS: 600.0000 mg | ORAL_TABLET | Freq: Two times a day (BID) | ORAL | 0 refills | Status: AC

## 2021-01-06 MED ORDER — PANTOPRAZOLE SODIUM 40 MG PO TBEC: 40.0000 mg | DELAYED_RELEASE_TABLET | Freq: Every day | ORAL | Status: AC

## 2021-01-06 MED ORDER — LINACLOTIDE 145 MCG PO CAPS: 145.0000 ug | ORAL_CAPSULE | Freq: Every day | ORAL | Status: DC

## 2021-01-06 MED ORDER — AMITRIPTYLINE HCL 10 MG PO TABS: 10.0000 mg | ORAL_TABLET | Freq: Every day | ORAL | Status: AC

## 2021-01-06 MED ORDER — ARTIFICIAL TEARS OPHTHALMIC OINT: TOPICAL_OINTMENT | OPHTHALMIC | Status: AC | PRN

## 2021-01-06 MED ORDER — SIMETHICONE 40 MG/0.6ML PO SUSP
40.0000 mg | Freq: Four times a day (QID) | ORAL | Status: DC
  Administered 2021-01-06 (×2): 40 mg via ORAL
  Filled 2021-01-06 (×3): qty 0.6

## 2021-01-06 MED ORDER — RISAQUAD PO CAPS: 2.0000 | ORAL_CAPSULE | Freq: Three times a day (TID) | ORAL | 0 refills | Status: AC

## 2021-01-06 NOTE — Care Management Important Message (Signed)
Important Message  Patient Details IM Letter given to the Patient. Name: Vincent Anthony MRN: [id number] Date of Birth: Jun 26, 1971   Medicare Important Message Given:  Yes     Caren Macadam 01/06/2021, 2:42 PM

## 2021-01-06 NOTE — Plan of Care (Signed)

## 2021-01-06 NOTE — Progress Notes (Signed)
RN called report to Vincent Duey, LPN at [facility]. All questions addressed. RN informed her [name] had been called for transport but time of arrival is unknown. Pt updated on plan.

## 2021-01-06 NOTE — TOC Progression Note (Signed)
Transition of Care [facility]) - Progression Note    Patient Details  Name: [name] MRN: [id number] Date of Birth: 10/30/1970  Transition of [facility] CM/SW Contact  Armanda Heritage, RN Phone Number: 01/06/2021, 4:45 PM  Clinical Narrative:    DC summary faxed to [facility].  Bedside RN please call report to [phone].  [facility] transport arranged.  Expected Discharge Plan: Skilled Nursing Facility Barriers to Discharge: Other (comment) (covid test)  Expected Discharge Plan and Services Expected Discharge Plan: Skilled Nursing Facility   Discharge Planning Services: CM Consult Post Acute Care Choice: Resumption of Svcs/PTA Provider Living arrangements for the past 2 months: Skilled Nursing Facility Expected Discharge Date: 01/06/21                                     Social Determinants of Health (SDOH) Interventions    Readmission Risk Interventions No flowsheet data found.

## 2021-01-06 NOTE — Progress Notes (Addendum)
PROGRESS NOTE  Vincent Anthony ZOX:[id number] DOB: 1971/06/14 DOA: 12/29/2020 PCP: [name], MD   LOS: 8 days   Brief narrative:  Patient is a 50 years old male with history of multiple sclerosis and overactive bladder on indwelling Foley catheter for 1 month presented initially to [facility] after traumatic Foley exchange attempt with hematuria.  Patient had a new Foley catheter placed on 08/30/2021 but did not have any urinary output and had profuse bleeding.  They tried continuous bladder irrigation but unable to flush so patient ended up being in the hospital.  In the ED, was noted to have fever dyspnea hypotension and tachycardia with brief loss of consciousness.  He was thought to have sepsis secondary to instrumentation and was started on vancomycin Flagyl.  He was then admitted to the ICU for septic shock.   Patient was subsequently considered stable in the ICU and was weaned off vasopressors.  He was then transferred out of the ICU.  Assessment/Plan:  Principal Problem:   Septic shock (HCC) Active Problems:   MS (multiple sclerosis) (HCC)   Neurogenic bladder   Bacteremia   Immunosuppressed status (HCC)  Septic Shock secondary to E-coli and possible GPC Bacteremia,  Sepsis physiology now resolved.  Completed Keflex.  Continue and linezolid total of 14 days- end date of linezolid 4/17.  T-max of 98.23F  Leukocytosis Trending down.  Off steroids.  On antibiotics.  Check CBC in a.m. Marland Kitchen   CBC Latest Ref Rng & Units 01/06/2021 01/04/2021 01/02/2021  WBC 4.0 - 10.5 K/uL 17.3(H) 19.5(H) 53.4(HH)  Hemoglobin 13.0 - 17.0 g/dL 8.1(X) 10.5(L) 9.6(L)  Hematocrit 39.0 - 52.0 % 27.9(L) 31.7(L) 28.7(L)  Platelets 150 - 400 K/uL 177 125(L) 72(L)    Hematuria secondary to Malpositioned Foley  Resolved.  Continue Foley catheter.  No hydronephrosis.  Will need to follow-up with his primary urologist after discharge.  Ok for facility RN to change catheter  but need to be aware of 30ml balloon.  Bloating, abdominal distention, diarrhea. patient does have chronic distention at baseline.  Added simethicone, probiotic but still has symptoms.  Will hold Senokot MiraLAX and Linzess for today. Urinary Retention  Continue Foley catheter.  Acute kidney injury, improved  Creatinine of 1.1.  At baseline.  Has improved.  Encourage oral intake.  Continue to monitor BMP.  Questionable Seizure  On Keppra long-term for adjuvant pain control.  No history of seizures in the past.    Anemia Continue to monitor.  Transfuse if necessary.  Latest hemoglobin of 9.1  Diabetes mellitus type 2. Continue sliding scale insulin, Accu-Cheks, diabetic diet.  Patient is on Metformin at home which is on hold.  Latest POC glucose of 104  History of multiple sclerosis. Continue Lyrica and baclofen.  Physical therapy occupational therapy has seen the patient and recommended skilled nursing facility on discharge  Hyperlipidemia. Continue statins.   Constipation Hold Linzess if diarrhea   Hypokalemia.  Continue to replenish.  Check level in a.m.  Anxiety  On Xanax  DVT prophylaxis: enoxaparin (LOVENOX) injection 40 mg Start: 01/05/21 2000 SCDs Start: 12/30/20 0240  Code Status: Full code  Family Communication:  None today.   Status is: Inpatient  Remains inpatient appropriate because:IV treatments appropriate due to intensity of illness or inability to take PO and Inpatient level of care appropriate due to severity of illness  Dispo: The patient is from: Skilled nursing facility  Anticipated d/c is to: Skilled nursing facility likely tomorrow.  We will get Covid test today.  Replenish lites.              Patient currently is not medically stable to d/c.   Difficult to place patient No  Consultants:  Infectious disease,   PCCM  Urology  Procedures:  Foley catheter placement,   central line placement and  removal.  Anti-infectives:  . Keflex 01/02/21> . Zyvox 01/02/21>  Anti-infectives (From admission, onward)   Start     Dose/Rate Route Frequency Ordered Stop   01/02/21 1200  cephALEXin (KEFLEX) capsule 500 mg        500 mg Oral Every 6 hours 01/02/21 1032 01/04/21 1807   01/02/21 1130  linezolid (ZYVOX) tablet 600 mg        600 mg Oral Every 12 hours 01/02/21 1032 01/15/21 0959   01/01/21 1400  ceFAZolin (ANCEF) IVPB 2g/100 mL premix  Status:  Discontinued        2 g 200 mL/hr over 30 Minutes Intravenous Every 8 hours 01/01/21 1135 01/01/21 1157   01/01/21 1400  Ampicillin-Sulbactam (UNASYN) 3 g in sodium chloride 0.9 % 100 mL IVPB  Status:  Discontinued        3 g 200 mL/hr over 30 Minutes Intravenous Every 6 hours 01/01/21 1157 01/02/21 1032   01/01/21 1200  vancomycin (VANCOREADY) IVPB 1500 mg/300 mL  Status:  Discontinued        1,500 mg 150 mL/hr over 120 Minutes Intravenous Every 12 hours 01/01/21 0958 01/01/21 1157   12/31/20 2000  vancomycin (VANCOREADY) IVPB 2000 mg/400 mL  Status:  Discontinued        2,000 mg 200 mL/hr over 120 Minutes Intravenous Every 24 hours 12/30/20 0212 12/31/20 0732   12/31/20 0800  vancomycin (VANCOREADY) IVPB 1250 mg/250 mL  Status:  Discontinued        1,250 mg 166.7 mL/hr over 90 Minutes Intravenous Every 12 hours 12/31/20 0732 01/01/21 0958   12/30/20 0400  ceFEPIme (MAXIPIME) 2 g in sodium chloride 0.9 % 100 mL IVPB  Status:  Discontinued        2 g 200 mL/hr over 30 Minutes Intravenous Every 8 hours 12/30/20 0212 01/01/21 1135   12/29/20 1930  vancomycin (VANCOCIN) IVPB 1000 mg/200 mL premix        1,000 mg 200 mL/hr over 60 Minutes Intravenous  Once 12/29/20 1855 12/29/20 2012   12/29/20 1700  ceFEPIme (MAXIPIME) 2 g in sodium chloride 0.9 % 100 mL IVPB        2 g 200 mL/hr over 30 Minutes Intravenous  Once 12/29/20 1650 12/29/20 1807   12/29/20 1700  metroNIDAZOLE (FLAGYL) IVPB 500 mg        500 mg 100 mL/hr over 60 Minutes Intravenous   Once 12/29/20 1650 12/29/20 1839   12/29/20 1700  vancomycin (VANCOCIN) IVPB 1000 mg/200 mL premix        1,000 mg 200 mL/hr over 60 Minutes Intravenous  Once 12/29/20 1650 12/29/20 2012     Subjective: Patient was seen and examined at bedside.  Denies any nausea, vomiting or overt abdominal pain.  Feels gaseous.  Still has some loose stools.  Objective: Vitals:   01/05/21 1959 01/06/21 0430  BP: 131/60 130/67  Pulse: 81 81  Resp: 18 20  Temp: 98.4 F (36.9 C) 98.7 F (37.1 C)  SpO2: 98% 100%    Intake/Output Summary (Last 24 hours) at 01/06/2021 1354 Last data filed  at 01/06/2021 1327 Gross per 24 hour  Intake 670 ml  Output 4275 ml  Net -3605 ml   Filed Weights   01/02/21 0500 01/03/21 0500 01/06/21 0500  Weight: 110.7 kg 107.3 kg 102.3 kg   Body mass index is 28.19 kg/m.   Physical Exam: General:  Average built, not in obvious distress, alert awake, dysarthric, HENT:   No scleral pallor or icterus noted. Oral mucosa is moist.  Chest:  Clear breath sounds.  Diminished breath sounds bilaterally. No crackles or wheezes.  CVS: S1 &S2 heard. No murmur.  Regular rate and rhythm. Abdomen: Soft, nontender, mildly distended, tympanic.  Bowel sounds are heard.   Extremities: No cyanosis, clubbing or edema.  Peripheral pulses are palpable.  Right upper extremity atrophy with mild weakness.  Bilateral lower extremity weakness. Psych: Alert, awake and oriented, normal mood CNS: Dysarthria, right upper extremity weakness, bilateral lower extremity weakness Skin: Warm and dry.  No rashes noted.   Data Review: I have personally reviewed the following laboratory data and studies,  CBC: Recent Labs  Lab 12/31/20 0343 01/01/21 0300 01/02/21 0412 01/04/21 0240 01/06/21 0327  WBC 40.0* 48.7* 53.4* 19.5* 17.3*  NEUTROABS  --   --  48.8*  --   --   HGB 9.7* 9.6* 9.6* 10.5* 9.1*  HCT 27.9* 28.1* 28.7* 31.7* 27.9*  MCV 80.6 81.7 82.9 84.5 84.0  PLT 90* 64* 72* 125* 177   Basic  Metabolic Panel: Recent Labs  Lab 01/01/21 0300 01/02/21 0412 01/03/21 0511 01/04/21 0240 01/06/21 0327  NA 133* 146* 141 139 139  K 3.0* 3.4* 4.5 2.9* 3.1*  CL 104 114* 113* 108 106  CO2 21* 23 21* 23 24  GLUCOSE 157* 116* 101* 83 115*  BUN 19 14 21* 19 8  CREATININE 1.03 1.15 1.10 1.12 0.86  CALCIUM 7.5* 8.4* 8.3* 8.2* 8.2*  MG  --   --   --  1.7 1.7  PHOS  --   --   --  2.8  --    Liver Function Tests: Recent Labs  Lab 01/02/21 0412 01/04/21 0240  AST 30 39  ALT 33 52*  ALKPHOS 132* 116  BILITOT 0.7 0.8  PROT 4.9* 5.0*  ALBUMIN 2.2* 2.4*   No results for input(s): LIPASE, AMYLASE in the last 168 hours. No results for input(s): AMMONIA in the last 168 hours. Cardiac Enzymes: No results for input(s): CKTOTAL, CKMB, CKMBINDEX, TROPONINI in the last 168 hours. BNP (last 3 results) No results for input(s): BNP in the last 8760 hours.  ProBNP (last 3 results) No results for input(s): PROBNP in the last 8760 hours.  CBG: Recent Labs  Lab 01/05/21 1636 01/05/21 1955 01/06/21 0006 01/06/21 0726 01/06/21 1231  GLUCAP 110* 122* 127* 103* 104*   Recent Results (from the past 240 hour(s))  Urine culture     Status: None   Collection Time: 12/29/20  4:34 PM   Specimen: In/Out Cath Urine  Result Value Ref Range Status   Specimen Description   Final    IN/OUT CATH URINE Performed at [facility], [address], [place], [place] [address]    Special Requests   Final    NONE Performed at [facility], [address], [place], [place] [address]    Culture   Final    NO GROWTH Performed at [facility], [address], [place], [place] [address]    Report Status 12/31/2020 FINAL  Final  Blood culture (routine single)  Status: Abnormal   Collection Time: 12/29/20  4:35 PM   Specimen: Left Antecubital; Blood  Result Value Ref Range Status   Specimen Description   Final    LEFT ANTECUBITAL BOTTLES DRAWN AEROBIC AND ANAEROBIC   Special Requests Blood  Culture adequate volume  Final   Culture  Setup Time   Final    GRAM NEGATIVE RODS ANAEROBIC BOTTLE Gram Stain Report Called to,Read Back By and Verified With: HEAVNER,A@1211  BY MATTHEWS,B 4.4.22 PT @[facility]   Organism ID to follow GRAM POSITIVE COCCI ANAEROBIC BOTTLE ONLY CRITICAL RESULT CALLED TO, READ BACK BY AND VERIFIED WITH: PHARMD MELISSA M. 2002 [id number] FCP    Culture ESCHERICHIA COLI ENTEROCOCCUS FAECALIS  (A)  Final   Report Status 01/02/2021 FINAL  Final   Organism ID, Bacteria ESCHERICHIA COLI  Final   Organism ID, Bacteria ENTEROCOCCUS FAECALIS  Final      Susceptibility   Escherichia coli - MIC*    AMPICILLIN 16 INTERMEDIATE Intermediate     CEFAZOLIN <=4 SENSITIVE Sensitive     CEFEPIME <=0.12 SENSITIVE Sensitive     CEFTAZIDIME <=1 SENSITIVE Sensitive     CEFTRIAXONE <=0.25 SENSITIVE Sensitive     CIPROFLOXACIN <=0.25 SENSITIVE Sensitive     GENTAMICIN <=1 SENSITIVE Sensitive     IMIPENEM <=0.25 SENSITIVE Sensitive     TRIMETH/SULFA >=320 RESISTANT Resistant     AMPICILLIN/SULBACTAM 4 SENSITIVE Sensitive     PIP/TAZO <=4 SENSITIVE Sensitive     * ESCHERICHIA COLI   Enterococcus faecalis - MIC*    AMPICILLIN <=2 SENSITIVE Sensitive     VANCOMYCIN 1 SENSITIVE Sensitive     GENTAMICIN SYNERGY SENSITIVE Sensitive     LINEZOLID Value in next row Sensitive      SENSITIVE2Performed at [facility], [address] [place], [place] [id number]    * ENTEROCOCCUS FAECALIS  Blood Culture ID Panel (Reflexed)     Status: Abnormal   Collection Time: 12/29/20  4:35 PM  Result Value Ref Range Status   Enterococcus faecalis DETECTED (A) NOT DETECTED Final    Comment: CRITICAL RESULT CALLED TO, READ BACK BY AND VERIFIED WITH: PHARMD MELISSA M. 2002 [id number] FCP    Enterococcus Faecium NOT DETECTED NOT DETECTED Final   Listeria monocytogenes NOT DETECTED NOT DETECTED Final   Staphylococcus species NOT DETECTED NOT DETECTED Final   Staphylococcus aureus (BCID) NOT DETECTED NOT  DETECTED Final   Staphylococcus epidermidis NOT DETECTED NOT DETECTED Final   Staphylococcus lugdunensis NOT DETECTED NOT DETECTED Final   Streptococcus species NOT DETECTED NOT DETECTED Final   Streptococcus agalactiae NOT DETECTED NOT DETECTED Final   Streptococcus pneumoniae NOT DETECTED NOT DETECTED Final   Streptococcus pyogenes NOT DETECTED NOT DETECTED Final   A.calcoaceticus-baumannii NOT DETECTED NOT DETECTED Final   Bacteroides fragilis NOT DETECTED NOT DETECTED Final   Enterobacterales DETECTED (A) NOT DETECTED Final    Comment: Enterobacterales represent a large order of gram negative bacteria, not a single organism. CRITICAL RESULT CALLED TO, READ BACK BY AND VERIFIED WITH: PHARMD MELISSA M. 2002 [id number] FCP    Enterobacter cloacae complex NOT DETECTED NOT DETECTED Final   Escherichia coli DETECTED (A) NOT DETECTED Final    Comment: CRITICAL RESULT CALLED TO, READ BACK BY AND VERIFIED WITH: PHARMD MELISSA M. 2002 [id number] FCP    Klebsiella aerogenes NOT DETECTED NOT DETECTED Final   Klebsiella oxytoca NOT DETECTED NOT DETECTED Final   Klebsiella pneumoniae NOT DETECTED NOT DETECTED Final   Proteus species NOT DETECTED NOT DETECTED Final  Salmonella species NOT DETECTED NOT DETECTED Final   Serratia marcescens NOT DETECTED NOT DETECTED Final   Haemophilus influenzae NOT DETECTED NOT DETECTED Final   Neisseria meningitidis NOT DETECTED NOT DETECTED Final   Pseudomonas aeruginosa NOT DETECTED NOT DETECTED Final   Stenotrophomonas maltophilia NOT DETECTED NOT DETECTED Final   Candida albicans NOT DETECTED NOT DETECTED Final   Candida auris NOT DETECTED NOT DETECTED Final   Candida glabrata NOT DETECTED NOT DETECTED Final   Candida krusei NOT DETECTED NOT DETECTED Final   Candida parapsilosis NOT DETECTED NOT DETECTED Final   Candida tropicalis NOT DETECTED NOT DETECTED Final   Cryptococcus neoformans/gattii NOT DETECTED NOT DETECTED Final   CTX-M ESBL NOT DETECTED NOT  DETECTED Final   Carbapenem resistance IMP NOT DETECTED NOT DETECTED Final   Carbapenem resistance KPC NOT DETECTED NOT DETECTED Final   Carbapenem resistance NDM NOT DETECTED NOT DETECTED Final   Carbapenem resist OXA 48 LIKE NOT DETECTED NOT DETECTED Final   Vancomycin resistance NOT DETECTED NOT DETECTED Final   Carbapenem resistance VIM NOT DETECTED NOT DETECTED Final    Comment: Performed at [facility], [address] [place], [place] [address]  Culture, blood (single)     Status: None   Collection Time: 12/29/20  6:50 PM   Specimen: A-Line; Blood  Result Value Ref Range Status   Specimen Description A-LINE CENTRAL LINE  Final   Special Requests   Final    Blood Culture results may not be optimal due to an excessive volume of blood received in culture bottles BOTTLES DRAWN AEROBIC AND ANAEROBIC   Culture   Final    NO GROWTH 5 DAYS Performed at [facility], [address], [place], [place] [address]    Report Status 01/03/2021 FINAL  Final  MRSA PCR Screening     Status: None   Collection Time: 12/29/20 11:36 PM   Specimen: Nasopharyngeal  Result Value Ref Range Status   MRSA by PCR NEGATIVE NEGATIVE Final    Comment:        The GeneXpert MRSA Assay (FDA approved for NASAL specimens only), is one component of a comprehensive MRSA colonization surveillance program. It is not intended to diagnose MRSA infection nor to guide or monitor treatment for MRSA infections. Performed at [facility], [address], [place], [place] [address]   Culture, blood (routine x 2)     Status: None   Collection Time: 12/31/20  4:01 PM   Specimen: BLOOD  Result Value Ref Range Status   Specimen Description   Final    BLOOD RIGHT HAND Performed at [facility], [address] [place], [place] [address]    Special Requests   Final    BOTTLES DRAWN AEROBIC AND ANAEROBIC Blood Culture adequate volume Performed at [facility], [address], [place], [place] [address]    Culture   Final    NO GROWTH 5 DAYS Performed at [facility], [address], [place], [place] [address]    Report Status 01/05/2021 FINAL  Final  Culture, blood (routine x 2)     Status: None   Collection Time: 12/31/20  4:07 PM   Specimen: BLOOD  Result Value Ref Range Status   Specimen Description   Final    BLOOD RIGHT HAND Performed at [facility], [address], [place], [place] [address]    Special Requests   Final    BOTTLES DRAWN AEROBIC  AND ANAEROBIC Blood Culture results may not be optimal due to an inadequate volume of blood received in culture bottles Performed at [facility], [address] [place], [place] [address]    Culture   Final    NO GROWTH 5 DAYS Performed at [facility], [address], [place], [place] [address]    Report Status 01/05/2021 FINAL  Final     Studies: No results found.   Joycelyn Das, MD  [facility] 01/06/2021  If 7PM-7AM, please contact night-coverage

## 2021-01-06 NOTE — Discharge Summary (Addendum)
Physician Discharge Summary  Vincent Anthony:[id number] DOB: 26-Sep-1971 DOA: 12/29/2020  PCP: Vincent Robert, MD  Admit date: 12/29/2020 Discharge date: 01/06/2021  Admitted From: SNF  Discharge disposition:SNF  Recommendations for Outpatient Follow-Up:   Follow up with your primary care provider at the skilled nursing facility in 3 to 5 days..  Check CBC, BMP, magnesium, phosphorus in the next visit Patient has been prescribed linezolid until 01/12/2021.  Do not start amitriptyline till then. Continue urethral catheter at the facility.Ok for facility RN to change catheter but need to be aware of 30ml balloon.  Discharge Diagnosis:   Principal Problem:   Septic shock (HCC) Active Problems:   MS (multiple sclerosis) (HCC)   Neurogenic bladder   Bacteremia   Immunosuppressed status (HCC)  Discharge Condition: Improved.  Diet recommendation:  Regular.  Wound care: None.  Code status: Full.   History of Present Illness:   Patient is a 50 years old male with history of multiple sclerosis and overactive bladder on indwelling Foley catheter for 1 month presented initially to [facility] from [facility] after traumatic Foley exchange attempt with hematuria.  Patient had a new Foley catheter placed on 08/30/2021 but did not have any urinary output and had profuse bleeding.  They tried continuous bladder irrigation but unable to flush so patient ended up being in the hospital.  In the ED, was noted to have fever dyspnea hypotension and tachycardia with brief loss of consciousness.  He was thought to have sepsis secondary to instrumentation and was started on vancomycin Flagyl.  He was then admitted to the ICU for septic shock. Patient was subsequently considered stable in the ICU and was weaned off vasopressors.  He was then transferred out of the ICU.  Hospital Course:   Following conditions were addressed during hospitalization as listed below,  Septic Shock  secondary to E-coli and possible GPC Bacteremia,  Sepsis physiology now resolved.  Completed Keflex.  Continue and linezolid total of 14 days- end date of linezolid 4/17.  T-max of 98.3F.  Has clinically improved   Leukocytosis Trending down.  Off steroids.  On antibiotics until 4/17.  Hematuria secondary to Malpositioned Foley  Resolved.  Continue Foley catheter.  No hydronephrosis.  Will need to follow-up with his primary urologist after discharge.  Ok for facility RN to change catheter but need to be aware of 30ml balloon.   Bloating, abdominal distention, diarrhea. patient does have chronic distention at baseline.  Added simethicone, probiotic  discharge.  Hold Senokot MiraLAX and Linzess if loose stools more than 2 times per day.  Urinary Retention  Continue Foley catheter.   Acute kidney injury, improved  Creatinine of 1.1.  At baseline.  Has improved.  Encourage oral intake.    Questionable Seizure  Ruled out.  On Keppra long-term for adjuvant pain control.  No history of seizures in the past.     Acute blood loss anemia.   Patient received PRBC transfusion.  Continue to monitor as outpatient Latest hemoglobin of 9.1   Diabetes mellitus type 2. Patient received sliding scale insulin, Accu-Cheks, diabetic diet during hospitalization.  Will resume Metformin on discharge.  History of multiple sclerosis. Continue Lyrica and baclofen.  Physical therapy occupational therapy has seen the patient and recommended skilled nursing facility on discharge   Hyperlipidemia. Continue statins.    Constipation Hold Linzess Senokot, Amitiza if loose stools more than 2 times per day.   Hypokalemia.  Was replenished during hospitalization.  Will be  given potassium supplements for on discharge for few days.  Check BMP as outpatient.   Anxiety  On Xanax at home.  Resume.  Disposition.  At this time, patient is stable for disposition back to skilled nursing facility.  I spoke with the  patient's spouse on the phone regarding the plan for disposition and medications  Medical Consultants:   Infectious disease PCCM Neurology  Procedures:    Foley catheter placement,  central line placement and removal Subjective:   Today, patient was seen and examined at bedside.  Feels slightly bloated but no nausea vomiting.  Has had gas and bowel movements  Discharge Exam:   Vitals:   01/06/21 0430 01/06/21 1421  BP: 130/67 (!) 143/75  Pulse: 81 97  Resp: 20 (!) 23  Temp: 98.7 F (37.1 C) 98.5 F (36.9 C)  SpO2: 100% 98%   Vitals:   01/05/21 1959 01/06/21 0430 01/06/21 0500 01/06/21 1421  BP: 131/60 130/67  (!) 143/75  Pulse: 81 81  97  Resp: 18 20  (!) 23  Temp: 98.4 F (36.9 C) 98.7 F (37.1 C)  98.5 F (36.9 C)  TempSrc: Oral Oral  Oral  SpO2: 98% 100%  98%  Weight:   102.3 kg   Height:       General:  Average built, not in obvious distress, alert awake, dysarthric, HENT:   No scleral pallor or icterus noted. Oral mucosa is moist.  Chest:  Clear breath sounds.  Diminished breath sounds bilaterally. No crackles or wheezes.  CVS: S1 &S2 heard. No murmur.  Regular rate and rhythm. Abdomen: Soft, nontender, mildly distended, tympanic Bowel sounds are heard.   Extremities: No cyanosis, clubbing or edema.  Peripheral pulses are palpable.  Right upper extremity atrophy with mild weakness.  Bilateral lower extremity weakness. Psych: Alert, awake and oriented, normal mood CNS: Dysarthria, right upper extremity weakness, bilateral lower extremity weakness Skin: Warm and dry.  No rashes noted.  The results of significant diagnostics from this hospitalization (including imaging, microbiology, ancillary and laboratory) are listed below for reference.     Diagnostic Studies:   DG Chest Portable 1 View  Result Date: 12/29/2020 CLINICAL DATA:  Central line placement EXAM: PORTABLE CHEST 1 VIEW COMPARISON:  December 30, 2015 FINDINGS: A right central line terminates in the  central SVC. No pneumothorax. The platelike opacity in left base is better seen on the previous study due to rotation on the current study. However, this persistent is likely atelectasis. Mild oval opacity in the periphery of the right lung base was not seen earlier today. No other acute abnormalities. IMPRESSION: 1. The right central line terminates in the central SVC without pneumothorax. 2. New oval opacity in the lateral right lung base could represent atelectasis or developing infiltrate. Recommend attention on short-term follow-up. 3. Persistent opacity in the left base is better seen earlier today. Earlier today, this opacity appeared to represent atelectasis. Electronically Signed   By: Gerome Samavid  Williams III M.D   On: 12/29/2020 19:26   DG Chest Port 1 View  Result Date: 12/29/2020 CLINICAL DATA:  Questionable sepsis. EXAM: PORTABLE CHEST 1 VIEW COMPARISON:  November 17, 2020 FINDINGS: Platelike opacity in left base is likely atelectasis. The heart, hila, mediastinum, lungs, and pleura are otherwise unremarkable. IMPRESSION: Platelike opacity in left base is likely subsegmental atelectasis. No other abnormalities. Electronically Signed   By: Gerome Samavid  Williams III M.D   On: 12/29/2020 17:38   CT Angio Chest/Abd/Pel for Dissection W and/or Wo Contrast  Result Date: 12/29/2020 CLINICAL DATA:  Abdominal pain. Chest pain with concern for dissection. EXAM: CT ANGIOGRAPHY CHEST, ABDOMEN AND PELVIS TECHNIQUE: Non-contrast CT of the chest was initially obtained. Multidetector CT imaging through the chest, abdomen and pelvis was performed using the standard protocol during bolus administration of intravenous contrast. Multiplanar reconstructed images and MIPs were obtained and reviewed to evaluate the vascular anatomy. CONTRAST:  OMNIPAQUE IOHEXOL 350 MG/ML SOLN COMPARISON:  CT dated 10/29/2020 FINDINGS: CTA CHEST FINDINGS Cardiovascular: Exam is somewhat degraded by motion artifact. There is no evidence for  thoracic aortic dissection or aneurysm. No definite large centrally located pulmonary embolism. The heart size is stable from prior study. There is no significant pericardial effusion. Mediastinum/Nodes: -- No mediastinal lymphadenopathy. -- No hilar lymphadenopathy. -- No axillary lymphadenopathy. -- No supraclavicular lymphadenopathy. -- Normal thyroid gland where visualized. -  Unremarkable esophagus. Lungs/Pleura: There are linear airspace opacities at the lung bases. No large pleural effusion. No pneumothorax. Musculoskeletal: No chest wall abnormality. No bony spinal canal stenosis. Review of the MIP images confirms the above findings. CTA ABDOMEN AND PELVIS FINDINGS VASCULAR Aorta: Normal caliber aorta without aneurysm, dissection, vasculitis or significant stenosis. Celiac: Patent without evidence of aneurysm, dissection, vasculitis or significant stenosis. SMA: Patent without evidence of aneurysm, dissection, vasculitis or significant stenosis. Renals: Both renal arteries are patent without evidence of aneurysm, dissection, vasculitis, fibromuscular dysplasia or significant stenosis. IMA: Patent without evidence of aneurysm, dissection, vasculitis or significant stenosis. Inflow: Patent without evidence of aneurysm, dissection, vasculitis or significant stenosis. Veins: No obvious venous abnormality within the limitations of this arterial phase study. There are pockets of gas within the right common femoral vein which may related to venous injection of air during contrast administration. Review of the MIP images confirms the above findings. NON-VASCULAR Lower chest: The lung bases are clear. The heart size is normal. Hepatobiliary: The liver is normal. Normal gallbladder.There is no biliary ductal dilation. Pancreas: Normal contours without ductal dilatation. No peripancreatic fluid collection. Spleen: Unremarkable. Adrenals/Urinary Tract: --Adrenal glands: Unremarkable. --Right kidney/ureter: No  hydronephrosis or radiopaque kidney stones. --Left kidney/ureter: No hydronephrosis or radiopaque kidney stones. --Urinary bladder: There is gas within the urinary bladder, presumably from prior instrumentation. Stomach/Bowel: --Stomach/Duodenum: No hiatal hernia or other gastric abnormality. Normal duodenal course and caliber. --Small bowel: Unremarkable. --Colon: There is mild rectal wall thickening which is somewhat similar to prior study. --Appendix: Normal. Vascular/Lymphatic: Normal course and caliber of the major abdominal vessels. --No retroperitoneal lymphadenopathy. --No mesenteric lymphadenopathy. --No pelvic or inguinal lymphadenopathy. Reproductive: The Foley catheter bulb is inflated were proximal urethra. There are adjacent pockets of gas and free fluid (axial series 5, image 213). 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 Other: No ascites or free air. There is a fat and fluid containing right inguinal hernia. Musculoskeletal. No acute displaced fractures. Review of the MIP images confirms the above findings. IMPRESSION: 1. Motion degraded study. 2. Malpositioned Foley catheter bulb, currently CT weighted in the membranous part of the urethra. There are adjacent pockets of gas and free fluid concerning for a traumatic urethral injury. Repositioning is recommended. Urology consultation should be considered. 3. No evidence for an aortic dissection.  No acute abnormality. 4. Chronic findings as detailed above, not substantially changed from recent prior studies. These results will be called to the ordering clinician or representative by the Radiologist Assistant, and communication documented in the PACS or [employer]. Electronically Signed   By: Katherine Mantle M.D.   On: 12/29/2020 19:05  Labs:   Basic Metabolic Panel: Recent Labs  Lab 01/01/21 0300 01/02/21 0412 01/03/21 0511 01/04/21 0240 01/06/21 0327  NA 133* 146* 141 139 139  K 3.0* 3.4* 4.5 2.9* 3.1*  CL 104 114* 113* 108  106  CO2 21* 23 21* 23 24  GLUCOSE 157* 116* 101* 83 115*  BUN 19 14 21* 19 8  CREATININE 1.03 1.15 1.10 1.12 0.86  CALCIUM 7.5* 8.4* 8.3* 8.2* 8.2*  MG  --   --   --  1.7 1.7  PHOS  --   --   --  2.8  --    GFR Estimated Creatinine Clearance: 133.1 mL/min (by C-G formula based on SCr of 0.86 mg/dL). Liver Function Tests: Recent Labs  Lab 01/02/21 0412 01/04/21 0240  AST 30 39  ALT 33 52*  ALKPHOS 132* 116  BILITOT 0.7 0.8  PROT 4.9* 5.0*  ALBUMIN 2.2* 2.4*   No results for input(s): LIPASE, AMYLASE in the last 168 hours. No results for input(s): AMMONIA in the last 168 hours. Coagulation profile No results for input(s): INR, PROTIME in the last 168 hours.  CBC: Recent Labs  Lab 12/31/20 0343 01/01/21 0300 01/02/21 0412 01/04/21 0240 01/06/21 0327  WBC 40.0* 48.7* 53.4* 19.5* 17.3*  NEUTROABS  --   --  48.8*  --   --   HGB 9.7* 9.6* 9.6* 10.5* 9.1*  HCT 27.9* 28.1* 28.7* 31.7* 27.9*  MCV 80.6 81.7 82.9 84.5 84.0  PLT 90* 64* 72* 125* 177   Cardiac Enzymes: No results for input(s): CKTOTAL, CKMB, CKMBINDEX, TROPONINI in the last 168 hours. BNP: Invalid input(s): POCBNP CBG: Recent Labs  Lab 01/05/21 1636 01/05/21 1955 01/06/21 0006 01/06/21 0726 01/06/21 1231  GLUCAP 110* 122* 127* 103* 104*   D-Dimer No results for input(s): DDIMER in the last 72 hours. Hgb A1c No results for input(s): HGBA1C in the last 72 hours. Lipid Profile No results for input(s): CHOL, HDL, LDLCALC, TRIG, CHOLHDL, LDLDIRECT in the last 72 hours. Thyroid function studies No results for input(s): TSH, T4TOTAL, T3FREE, THYROIDAB in the last 72 hours.  Invalid input(s): FREET3 Anemia work up No results for input(s): VITAMINB12, FOLATE, FERRITIN, TIBC, IRON, RETICCTPCT in the last 72 hours. Microbiology Recent Results (from the past 240 hour(s))  Urine culture     Status: None   Collection Time: 12/29/20  4:34 PM   Specimen: In/Out Cath Urine  Result Value Ref Range Status    Specimen Description   Final    IN/OUT CATH URINE Performed at [facility], [address], [place], [place] [address]    Special Requests   Final    NONE Performed at [facility], [address], [place], [place] [address]    Culture   Final    NO GROWTH Performed at [facility], [address] [place], [place] [address]    Report Status 12/31/2020 FINAL  Final  Blood culture (routine single)     Status: Abnormal   Collection Time: 12/29/20  4:35 PM   Specimen: Left Antecubital; Blood  Result Value Ref Range Status   Specimen Description   Final    LEFT ANTECUBITAL BOTTLES DRAWN AEROBIC AND ANAEROBIC   Special Requests Blood Culture adequate volume  Final   Culture  Setup Time   Final    GRAM NEGATIVE RODS ANAEROBIC BOTTLE Gram Stain Report Called to,Read Back By and Verified With: HEAVNER,A@1211  BY MATTHEWS,B 4.4.22 PT @[facility]   Organism ID to follow GRAM POSITIVE COCCI  ANAEROBIC BOTTLE ONLY CRITICAL RESULT CALLED TO, READ BACK BY AND VERIFIED WITH: PHARMD MELISSA M. 2002 [id number] FCP    Culture ESCHERICHIA COLI ENTEROCOCCUS FAECALIS  (A)  Final   Report Status 01/02/2021 FINAL  Final   Organism ID, Bacteria ESCHERICHIA COLI  Final   Organism ID, Bacteria ENTEROCOCCUS FAECALIS  Final      Susceptibility   Escherichia coli - MIC*    AMPICILLIN 16 INTERMEDIATE Intermediate     CEFAZOLIN <=4 SENSITIVE Sensitive     CEFEPIME <=0.12 SENSITIVE Sensitive     CEFTAZIDIME <=1 SENSITIVE Sensitive     CEFTRIAXONE <=0.25 SENSITIVE Sensitive     CIPROFLOXACIN <=0.25 SENSITIVE Sensitive     GENTAMICIN <=1 SENSITIVE Sensitive     IMIPENEM <=0.25 SENSITIVE Sensitive     TRIMETH/SULFA >=320 RESISTANT Resistant     AMPICILLIN/SULBACTAM 4 SENSITIVE Sensitive     PIP/TAZO <=4 SENSITIVE Sensitive     * ESCHERICHIA COLI   Enterococcus faecalis - MIC*    AMPICILLIN <=2 SENSITIVE Sensitive     VANCOMYCIN 1 SENSITIVE Sensitive     GENTAMICIN SYNERGY SENSITIVE Sensitive     LINEZOLID  Value in next row Sensitive      SENSITIVE2Performed at [facility], [address] [place], [place] [address]    * ENTEROCOCCUS FAECALIS  Blood Culture ID Panel (Reflexed)     Status: Abnormal   Collection Time: 12/29/20  4:35 PM  Result Value Ref Range Status   Enterococcus faecalis DETECTED (A) NOT DETECTED Final    Comment: CRITICAL RESULT CALLED TO, READ BACK BY AND VERIFIED WITH: PHARMD MELISSA M. 2002 [id number] FCP    Enterococcus Faecium NOT DETECTED NOT DETECTED Final   Listeria monocytogenes NOT DETECTED NOT DETECTED Final   Staphylococcus species NOT DETECTED NOT DETECTED Final   Staphylococcus aureus (BCID) NOT DETECTED NOT DETECTED Final   Staphylococcus epidermidis NOT DETECTED NOT DETECTED Final   Staphylococcus lugdunensis NOT DETECTED NOT DETECTED Final   Streptococcus species NOT DETECTED NOT DETECTED Final   Streptococcus agalactiae NOT DETECTED NOT DETECTED Final   Streptococcus pneumoniae NOT DETECTED NOT DETECTED Final   Streptococcus pyogenes NOT DETECTED NOT DETECTED Final   A.calcoaceticus-baumannii NOT DETECTED NOT DETECTED Final   Bacteroides fragilis NOT DETECTED NOT DETECTED Final   Enterobacterales DETECTED (A) NOT DETECTED Final    Comment: Enterobacterales represent a large order of gram negative bacteria, not a single organism. CRITICAL RESULT CALLED TO, READ BACK BY AND VERIFIED WITH: PHARMD MELISSA M. 2002 [id number] FCP    Enterobacter cloacae complex NOT DETECTED NOT DETECTED Final   Escherichia coli DETECTED (A) NOT DETECTED Final    Comment: CRITICAL RESULT CALLED TO, READ BACK BY AND VERIFIED WITH: PHARMD MELISSA M. 2002 [id number] FCP    Klebsiella aerogenes NOT DETECTED NOT DETECTED Final   Klebsiella oxytoca NOT DETECTED NOT DETECTED Final   Klebsiella pneumoniae NOT DETECTED NOT DETECTED Final   Proteus species NOT DETECTED NOT DETECTED Final   Salmonella species NOT DETECTED NOT DETECTED Final   Serratia marcescens NOT DETECTED NOT  DETECTED Final   Haemophilus influenzae NOT DETECTED NOT DETECTED Final   Neisseria meningitidis NOT DETECTED NOT DETECTED Final   Pseudomonas aeruginosa NOT DETECTED NOT DETECTED Final   Stenotrophomonas maltophilia NOT DETECTED NOT DETECTED Final   Candida albicans NOT DETECTED NOT DETECTED Final   Candida auris NOT DETECTED NOT DETECTED Final   Candida glabrata NOT DETECTED NOT DETECTED Final   Candida krusei NOT DETECTED NOT DETECTED Final  Candida parapsilosis NOT DETECTED NOT DETECTED Final   Candida tropicalis NOT DETECTED NOT DETECTED Final   Cryptococcus neoformans/gattii NOT DETECTED NOT DETECTED Final   CTX-M ESBL NOT DETECTED NOT DETECTED Final   Carbapenem resistance IMP NOT DETECTED NOT DETECTED Final   Carbapenem resistance KPC NOT DETECTED NOT DETECTED Final   Carbapenem resistance NDM NOT DETECTED NOT DETECTED Final   Carbapenem resist OXA 48 LIKE NOT DETECTED NOT DETECTED Final   Vancomycin resistance NOT DETECTED NOT DETECTED Final   Carbapenem resistance VIM NOT DETECTED NOT DETECTED Final    Comment: Performed at [facility], [address], [place], [place] [address]  Culture, blood (single)     Status: None   Collection Time: 12/29/20  6:50 PM   Specimen: A-Line; Blood  Result Value Ref Range Status   Specimen Description A-LINE CENTRAL LINE  Final   Special Requests   Final    Blood Culture results may not be optimal due to an excessive volume of blood received in culture bottles BOTTLES DRAWN AEROBIC AND ANAEROBIC   Culture   Final    NO GROWTH 5 DAYS Performed at [facility], [address], [place], [place] [address]    Report Status 01/03/2021 FINAL  Final  MRSA PCR Screening     Status: None   Collection Time: 12/29/20 11:36 PM   Specimen: Nasopharyngeal  Result Value Ref Range Status   MRSA by PCR NEGATIVE NEGATIVE Final    Comment:        The GeneXpert MRSA Assay (FDA approved for NASAL specimens only), is one component of  a comprehensive MRSA colonization surveillance program. It is not intended to diagnose MRSA infection nor to guide or monitor treatment for MRSA infections. Performed at [facility], [address], [place], [place] [address]   Culture, blood (routine x 2)     Status: None   Collection Time: 12/31/20  4:01 PM   Specimen: BLOOD  Result Value Ref Range Status   Specimen Description   Final    BLOOD RIGHT HAND Performed at [facility], [address] [place], [place] [address]    Special Requests   Final    BOTTLES DRAWN AEROBIC AND ANAEROBIC Blood Culture adequate volume Performed at [facility], [address] [place], [place] [address]    Culture   Final    NO GROWTH 5 DAYS Performed at [facility], [address] [place], [place] [address]    Report Status 01/05/2021 FINAL  Final  Culture, blood (routine x 2)     Status: None   Collection Time: 12/31/20  4:07 PM   Specimen: BLOOD  Result Value Ref Range Status   Specimen Description   Final    BLOOD RIGHT HAND Performed at [facility], [address] [place], [place] [address]    Special Requests   Final    BOTTLES DRAWN AEROBIC AND ANAEROBIC Blood Culture results may not be optimal due to an inadequate volume of blood received in culture bottles Performed at [facility], [address], [place], [place] [address]    Culture   Final    NO GROWTH 5 DAYS Performed at [facility], [address] [place], [place] [address]    Report Status 01/05/2021 FINAL  Final     Discharge Instructions:   Discharge Instructions     Call MD for:  persistant nausea and vomiting  Complete by: As directed    Call MD for:  severe uncontrolled pain   Complete by: As directed    Call MD for:  temperature >100.4   Complete by: As directed    Diet general   Complete by: As directed    Discharge instructions    Complete by: As directed    Follow-up with primary care provider at the skilled nursing facility in 3 to 5 days.  Complete the course of antibiotic.   Increase activity slowly   Complete by: As directed       Allergies as of 01/06/2021       Reactions   Celebrex [celecoxib] Nausea And Vomiting   Vioxx [rofecoxib] Nausea And Vomiting   Zanaflex [tizanidine Hcl] Nausea And Vomiting   Clindamycin Diarrhea        Medication List     STOP taking these medications    clonazePAM 1 MG tablet Commonly known as: KLONOPIN   predniSONE 20 MG tablet Commonly known as: DELTASONE   sodium phosphate 7-19 GM/118ML Enem   sulfamethoxazole-trimethoprim 800-160 MG tablet Commonly known as: BACTRIM DS   VESIcare 10 MG tablet Generic drug: solifenacin       TAKE these medications    acidophilus Caps capsule Take 2 capsules by mouth 3 (three) times daily for 7 days.   amitriptyline 10 MG tablet Commonly known as: ELAVIL Take 1 tablet (10 mg total) by mouth at bedtime. Hold until linezolid course has finished What changed: additional instructions   artificial tears Oint ophthalmic ointment Commonly known as: LACRILUBE Place into both eyes every 4 (four) hours as needed for dry eyes.   baclofen 10 MG tablet Commonly known as: LIORESAL Take 1 tablet by mouth 3 (three) times daily.   cetirizine 10 MG tablet Commonly known as: ZYRTEC Take 10 mg by mouth daily.   hyoscyamine 0.375 MG 12 hr tablet Commonly known as: LEVBID TAKE (1) TABLET BYMOUTH EVERY TWELVE HOURS.   levETIRAcetam 250 MG tablet Commonly known as: KEPPRA Take 1 tablet by mouth 2 (two) times daily.   linaclotide 145 MCG Caps capsule Commonly known as: LINZESS Take 1 capsule (145 mcg total) by mouth daily before breakfast. Skip a dose if diarrhoea (loose stools)>2/times a day What changed: additional instructions   linezolid 600 MG tablet Commonly known as: ZYVOX Take 1 tablet (600 mg total) by mouth  every 12 (twelve) hours for 6 days.   lubiprostone 8 MCG capsule Commonly known as: Amitiza Take 1 capsule (8 mcg total) by mouth 2 (two) times daily with a meal. Skip the dose if diarrhoea >2/day What changed: additional instructions   magnesium oxide 400 (241.3 Mg) MG tablet Commonly known as: MAG-OX Take 1 tablet (400 mg total) by mouth 2 (two) times daily for 5 days.   metFORMIN 500 MG tablet Commonly known as: GLUCOPHAGE Take 500 mg by mouth 2 (two) times daily.   OCREVUS IV Inject into the vein. Every 6 months for MS   ondansetron 4 MG tablet Commonly known as: Zofran Take 1 tablet (4 mg total) by mouth every 8 (eight) hours as needed for nausea or vomiting.   oxybutynin 10 MG 24 hr tablet Commonly known as: DITROPAN-XL Take 10 mg by mouth daily.   pantoprazole 40 MG tablet Commonly known as: PROTONIX Take 1 tablet (40 mg total) by mouth at bedtime.   potassium chloride SA 20 MEQ tablet Commonly known as: KLOR-CON Take 1 tablet (20 mEq total) by mouth daily for 10  doses.   pravastatin 20 MG tablet Commonly known as: PRAVACHOL Take 20 mg by mouth every morning.   pregabalin 75 MG capsule Commonly known as: LYRICA Take 75 mg by mouth See admin instructions. 1 capsule in morning and 2 capsules at bedtime What changed: Another medication with the same name was removed. Continue taking this medication, and follow the directions you see here.   senna 8.6 MG tablet Commonly known as: SENOKOT Take 2 tablets (17.2 mg total) by mouth 2 (two) times daily. Hold for loose stools >2/day What changed: additional instructions   simethicone 40 MG/0.6ML drops Commonly known as: MYLICON Take 0.6 mLs (40 mg total) by mouth 4 (four) times daily for 10 days.   Vitamin D3 50 MCG (2000 UT) Tabs Take 2,000 Units by mouth in the morning.          Time coordinating discharge: 39 minutes  Signed:  Roshanna Cimino  [facility] 01/06/2021, 3:11 PM

## 2021-01-06 NOTE — TOC Progression Note (Signed)
Transition of Care [facility]) - Progression Note    Patient Details  Name: Vincent Anthony MRN: [id number] Date of Birth: Feb 09, 1971  [facility] CM/SW Contact  Armanda Heritage, RN Phone Number: 01/06/2021, 12:05 PM  Clinical Narrative:    Patient is a long term care resident at [facility], CM spoke with facility rep Debbie, facility is able to accept patient back.  MD notified of need for repeat Covid test for return to facility.  FL2 faxed to [place] and insurance auth initiated ref #[id number].    Expected Discharge Plan: Skilled Nursing Facility Barriers to Discharge: Other (comment) (covid test)  Expected Discharge Plan and Services Expected Discharge Plan: Skilled Nursing Facility   Discharge Planning Services: CM Consult Post Acute Care Choice: Resumption of Svcs/PTA Provider Living arrangements for the past 2 months: Skilled Nursing Facility                                       Social Determinants of Health (SDOH) Interventions    Readmission Risk Interventions No flowsheet data found.

## 2021-01-23 ENCOUNTER — Emergency Department (HOSPITAL_COMMUNITY): Payer: Medicare HMO

## 2021-01-23 ENCOUNTER — Other Ambulatory Visit: Payer: Self-pay

## 2021-01-23 ENCOUNTER — Inpatient Hospital Stay (HOSPITAL_COMMUNITY)
Admission: EM | Admit: 2021-01-23 | Discharge: 2021-01-30 | DRG: 698 | Disposition: A | Discharge status: Skilled Nursing Facility | Payer: Medicare HMO | Attending: Family Medicine | Admitting: Family Medicine

## 2021-01-23 ENCOUNTER — Encounter (HOSPITAL_COMMUNITY): Payer: Self-pay | Admitting: Emergency Medicine

## 2021-01-23 DIAGNOSIS — G35 Multiple sclerosis: Secondary | ICD-10-CM | POA: Diagnosis present

## 2021-01-23 DIAGNOSIS — Z9079 Acquired absence of other genital organ(s): Secondary | ICD-10-CM | POA: Diagnosis not present

## 2021-01-23 DIAGNOSIS — N319 Neuromuscular dysfunction of bladder, unspecified: Secondary | ICD-10-CM | POA: Diagnosis present

## 2021-01-23 DIAGNOSIS — N390 Urinary tract infection, site not specified: Secondary | ICD-10-CM | POA: Diagnosis present

## 2021-01-23 DIAGNOSIS — Z20822 Contact with and (suspected) exposure to covid-19: Secondary | ICD-10-CM | POA: Diagnosis present

## 2021-01-23 DIAGNOSIS — E785 Hyperlipidemia, unspecified: Secondary | ICD-10-CM | POA: Diagnosis present

## 2021-01-23 DIAGNOSIS — F419 Anxiety disorder, unspecified: Secondary | ICD-10-CM | POA: Diagnosis present

## 2021-01-23 DIAGNOSIS — N3001 Acute cystitis with hematuria: Secondary | ICD-10-CM | POA: Diagnosis present

## 2021-01-23 DIAGNOSIS — Z1624 Resistance to multiple antibiotics: Secondary | ICD-10-CM | POA: Diagnosis present

## 2021-01-23 DIAGNOSIS — T83518A Infection and inflammatory reaction due to other urinary catheter, initial encounter: Secondary | ICD-10-CM | POA: Diagnosis not present

## 2021-01-23 DIAGNOSIS — D849 Immunodeficiency, unspecified: Secondary | ICD-10-CM | POA: Diagnosis present

## 2021-01-23 DIAGNOSIS — Z8701 Personal history of pneumonia (recurrent): Secondary | ICD-10-CM

## 2021-01-23 DIAGNOSIS — G894 Chronic pain syndrome: Secondary | ICD-10-CM | POA: Diagnosis present

## 2021-01-23 DIAGNOSIS — Z833 Family history of diabetes mellitus: Secondary | ICD-10-CM | POA: Diagnosis not present

## 2021-01-23 DIAGNOSIS — Z79899 Other long term (current) drug therapy: Secondary | ICD-10-CM

## 2021-01-23 DIAGNOSIS — B961 Klebsiella pneumoniae [K. pneumoniae] as the cause of diseases classified elsewhere: Secondary | ICD-10-CM | POA: Diagnosis present

## 2021-01-23 DIAGNOSIS — Z8249 Family history of ischemic heart disease and other diseases of the circulatory system: Secondary | ICD-10-CM

## 2021-01-23 DIAGNOSIS — D72829 Elevated white blood cell count, unspecified: Secondary | ICD-10-CM

## 2021-01-23 DIAGNOSIS — K592 Neurogenic bowel, not elsewhere classified: Secondary | ICD-10-CM | POA: Diagnosis present

## 2021-01-23 DIAGNOSIS — T83511A Infection and inflammatory reaction due to indwelling urethral catheter, initial encounter: Secondary | ICD-10-CM | POA: Diagnosis not present

## 2021-01-23 DIAGNOSIS — K5900 Constipation, unspecified: Secondary | ICD-10-CM | POA: Diagnosis present

## 2021-01-23 DIAGNOSIS — B9620 Unspecified Escherichia coli [E. coli] as the cause of diseases classified elsewhere: Secondary | ICD-10-CM | POA: Diagnosis present

## 2021-01-23 DIAGNOSIS — K219 Gastro-esophageal reflux disease without esophagitis: Secondary | ICD-10-CM | POA: Diagnosis present

## 2021-01-23 DIAGNOSIS — Z7401 Bed confinement status: Secondary | ICD-10-CM | POA: Diagnosis not present

## 2021-01-23 DIAGNOSIS — Y846 Urinary catheterization as the cause of abnormal reaction of the patient, or of later complication, without mention of misadventure at the time of the procedure: Secondary | ICD-10-CM | POA: Diagnosis present

## 2021-01-23 DIAGNOSIS — J189 Pneumonia, unspecified organism: Secondary | ICD-10-CM | POA: Diagnosis not present

## 2021-01-23 DIAGNOSIS — M419 Scoliosis, unspecified: Secondary | ICD-10-CM | POA: Diagnosis present

## 2021-01-23 DIAGNOSIS — E119 Type 2 diabetes mellitus without complications: Secondary | ICD-10-CM | POA: Diagnosis present

## 2021-01-23 DIAGNOSIS — R7881 Bacteremia: Secondary | ICD-10-CM | POA: Diagnosis present

## 2021-01-23 LAB — URINALYSIS, ROUTINE W REFLEX MICROSCOPIC
Bilirubin Urine: NEGATIVE
Glucose, UA: NEGATIVE mg/dL
Ketones, ur: NEGATIVE mg/dL
Nitrite: NEGATIVE
Protein, ur: 100 mg/dL — AB
Specific Gravity, Urine: 1.015 (ref 1.005–1.030)
WBC, UA: >50 WBC/hpf — ABNORMAL HIGH (ref 0–5)
pH: 7 (ref 5.0–8.0)

## 2021-01-23 LAB — CBC WITH DIFFERENTIAL/PLATELET
Abs Immature Granulocytes: 0.13 10*3/uL — ABNORMAL HIGH (ref 0.00–0.07)
Basophils Absolute: 0 10*3/uL (ref 0.0–0.1)
Basophils Relative: 0 %
Eosinophils Absolute: 0.1 10*3/uL (ref 0.0–0.5)
Eosinophils Relative: 1 %
HCT: 34.9 % — ABNORMAL LOW (ref 39.0–52.0)
Hemoglobin: 11 g/dL — ABNORMAL LOW (ref 13.0–17.0)
Immature Granulocytes: 1 %
Lymphocytes Relative: 5 %
Lymphs Abs: 0.9 10*3/uL (ref 0.7–4.0)
MCH: 28.1 pg (ref 26.0–34.0)
MCHC: 31.5 g/dL (ref 30.0–36.0)
MCV: 89 fL (ref 80.0–100.0)
Monocytes Absolute: 1.3 10*3/uL — ABNORMAL HIGH (ref 0.1–1.0)
Monocytes Relative: 7 %
Neutro Abs: 15.5 10*3/uL — ABNORMAL HIGH (ref 1.7–7.7)
Neutrophils Relative %: 86 %
Platelets: 308 10*3/uL (ref 150–400)
RBC: 3.92 MIL/uL — ABNORMAL LOW (ref 4.22–5.81)
RDW: 18.1 % — ABNORMAL HIGH (ref 11.5–15.5)
WBC: 18.1 10*3/uL — ABNORMAL HIGH (ref 4.0–10.5)
nRBC: 0 % (ref 0.0–0.2)

## 2021-01-23 LAB — COMPREHENSIVE METABOLIC PANEL
ALT: 16 U/L (ref 0–44)
AST: 16 U/L (ref 15–41)
Albumin: 3.3 g/dL — ABNORMAL LOW (ref 3.5–5.0)
Alkaline Phosphatase: 94 U/L (ref 38–126)
Anion gap: 10 (ref 5–15)
BUN: 13 mg/dL (ref 6–20)
CO2: 28 mmol/L (ref 22–32)
Calcium: 8.8 mg/dL — ABNORMAL LOW (ref 8.9–10.3)
Chloride: 99 mmol/L (ref 98–111)
Creatinine, Ser: 1.1 mg/dL (ref 0.61–1.24)
GFR, Estimated: >60 mL/min (ref >60)
Glucose, Bld: 122 mg/dL — ABNORMAL HIGH (ref 70–99)
Potassium: 3.8 mmol/L (ref 3.5–5.1)
Sodium: 137 mmol/L (ref 135–145)
Total Bilirubin: 0.8 mg/dL (ref 0.3–1.2)
Total Protein: 6.6 g/dL (ref 6.5–8.1)

## 2021-01-23 LAB — GLUCOSE, CAPILLARY: Glucose-Capillary: 101 mg/dL — ABNORMAL HIGH (ref 70–99)

## 2021-01-23 LAB — RESP PANEL BY RT-PCR (FLU A&B, COVID) ARPGX2
Influenza A by PCR: NEGATIVE
Influenza B by PCR: NEGATIVE
SARS Coronavirus 2 by RT PCR: NEGATIVE

## 2021-01-23 LAB — PROTIME-INR
INR: 1.1 (ref 0.8–1.2)
Prothrombin Time: 14.4 seconds (ref 11.4–15.2)

## 2021-01-23 LAB — LACTIC ACID, PLASMA: Lactic Acid, Venous: 1.9 mmol/L (ref 0.5–1.9)

## 2021-01-23 LAB — APTT: aPTT: 29 seconds (ref 24–36)

## 2021-01-23 IMAGING — DX DG CHEST 1V PORT
1 series · 1 of 1 positions shown · non-contrast
Comparison: [DATE]

CLINICAL DATA: Questionable sepsis, fever, negative COVID.

EXAM:
PORTABLE CHEST 1 VIEW

[chest ap]
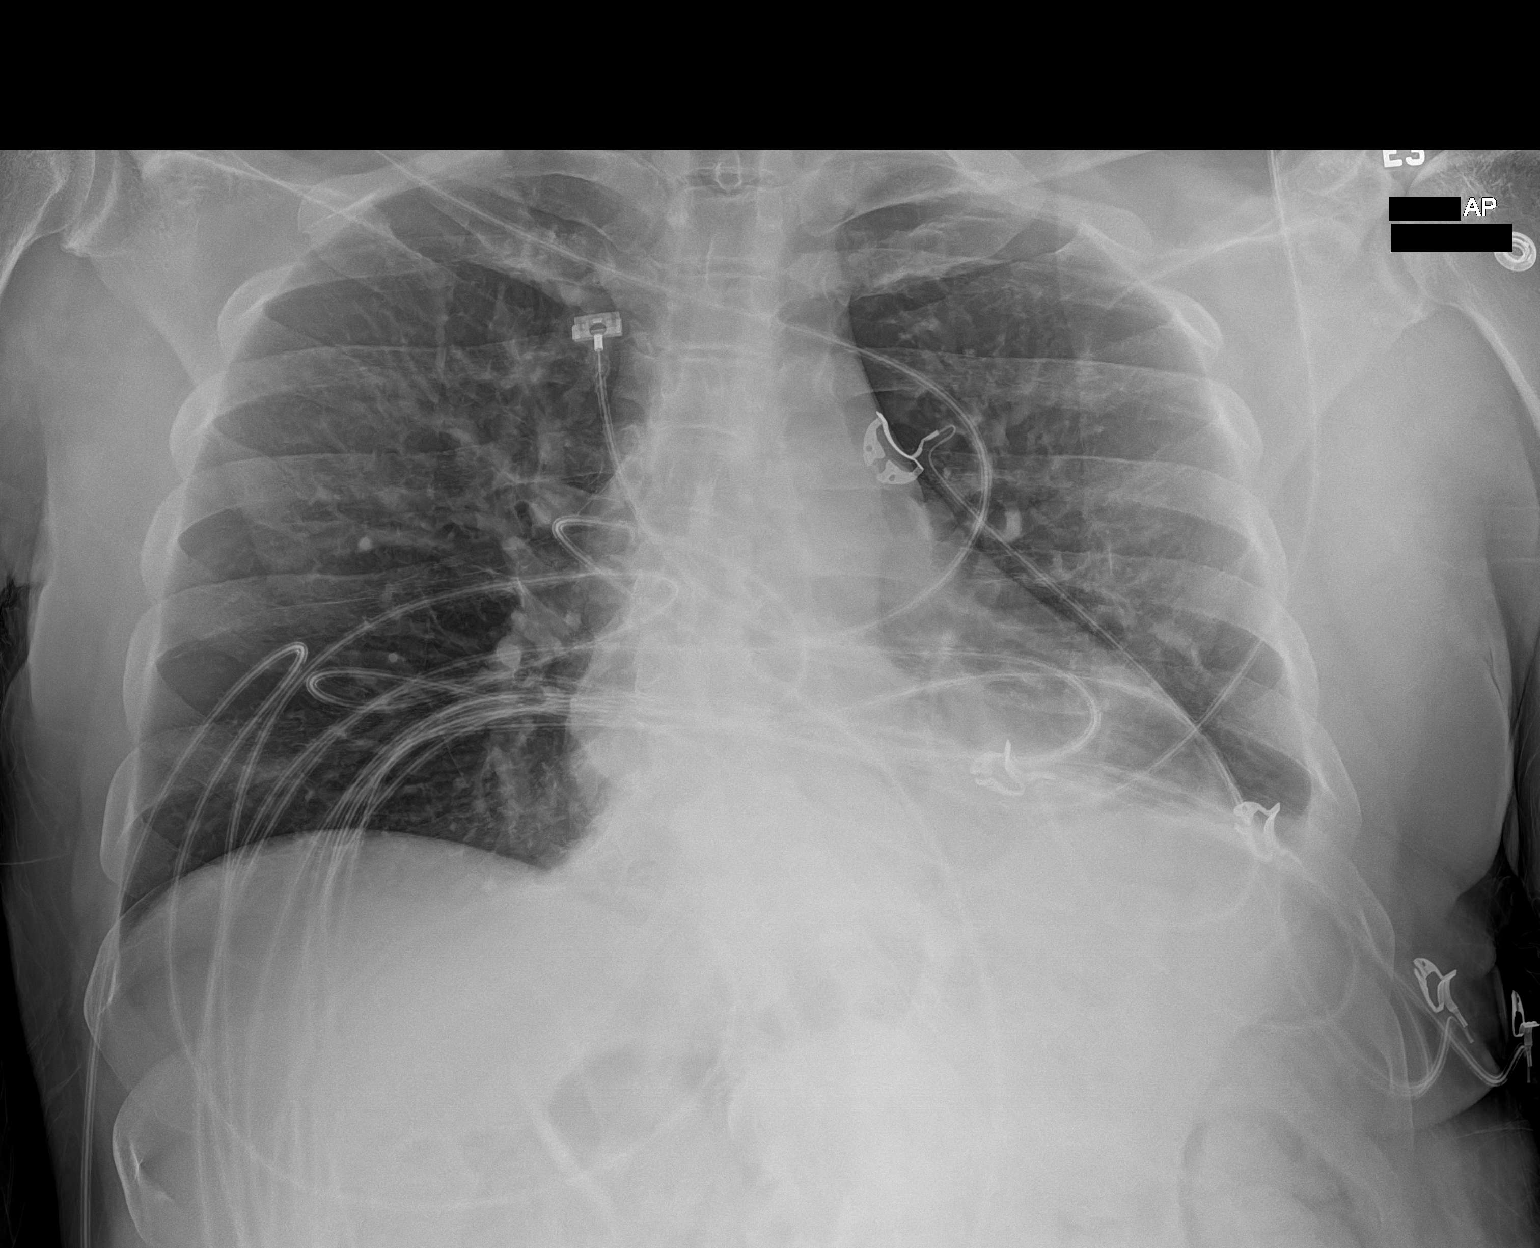

[1 of 1 positions shown; findings below may reference images not displayed]

FINDINGS: Shallow inspiration. Heart size and pulmonary vascularity are normal
for technique. No airspace disease or consolidation in the lungs. No
pleural effusions. No pneumothorax. Mediastinal contours appear
intact. Thoracic scoliosis convex towards the right.
IMPRESSION: No active disease.

## 2021-01-23 MED ORDER — ACETAMINOPHEN 325 MG PO TABS
650.0000 mg | ORAL_TABLET | Freq: Once | ORAL | Status: AC
  Administered 2021-01-23: 650 mg via ORAL
  Filled 2021-01-23: qty 2

## 2021-01-23 MED ORDER — ARTIFICIAL TEARS OPHTHALMIC OINT: TOPICAL_OINTMENT | OPHTHALMIC | Status: DC | PRN

## 2021-01-23 MED ORDER — ACETAMINOPHEN 325 MG PO TABS
650.0000 mg | ORAL_TABLET | Freq: Four times a day (QID) | ORAL | Status: DC | PRN
  Administered 2021-01-25 – 2021-01-29 (×5): 650 mg via ORAL
  Filled 2021-01-23 (×6): qty 2

## 2021-01-23 MED ORDER — HYOSCYAMINE SULFATE ER 0.375 MG PO TB12
0.3750 mg | ORAL_TABLET | Freq: Two times a day (BID) | ORAL | Status: DC
  Administered 2021-01-24 – 2021-01-30 (×12): 0.375 mg via ORAL
  Filled 2021-01-23 (×17): qty 1

## 2021-01-23 MED ORDER — LINACLOTIDE 145 MCG PO CAPS
145.0000 ug | ORAL_CAPSULE | Freq: Every day | ORAL | Status: DC
  Administered 2021-01-24 – 2021-01-30 (×7): 145 ug via ORAL
  Filled 2021-01-23 (×7): qty 1

## 2021-01-23 MED ORDER — SODIUM CHLORIDE 0.9 % IV SOLN
2.0000 g | Freq: Once | INTRAVENOUS | Status: AC
  Administered 2021-01-23: 2 g via INTRAVENOUS
  Filled 2021-01-23: qty 20

## 2021-01-23 MED ORDER — PANTOPRAZOLE SODIUM 40 MG PO TBEC
40.0000 mg | DELAYED_RELEASE_TABLET | Freq: Every day | ORAL | Status: DC
  Administered 2021-01-23 – 2021-01-29 (×7): 40 mg via ORAL
  Filled 2021-01-23 (×6): qty 1

## 2021-01-23 MED ORDER — INSULIN ASPART 100 UNIT/ML IJ SOLN: 0.0000 [IU] | Freq: Three times a day (TID) | INTRAMUSCULAR | Status: DC

## 2021-01-23 MED ORDER — LUBIPROSTONE 8 MCG PO CAPS
8.0000 ug | ORAL_CAPSULE | Freq: Two times a day (BID) | ORAL | Status: DC
  Administered 2021-01-24 – 2021-01-30 (×13): 8 ug via ORAL
  Filled 2021-01-23 (×15): qty 1

## 2021-01-23 MED ORDER — SENNA 8.6 MG PO TABS
2.0000 | ORAL_TABLET | Freq: Two times a day (BID) | ORAL | Status: DC
  Filled 2021-01-23 (×4): qty 2

## 2021-01-23 MED ORDER — LACTATED RINGERS IV BOLUS (SEPSIS)
1000.0000 mL | Freq: Once | INTRAVENOUS | Status: AC
  Administered 2021-01-23: 1000 mL via INTRAVENOUS

## 2021-01-23 MED ORDER — ENOXAPARIN SODIUM 40 MG/0.4ML IJ SOSY
40.0000 mg | PREFILLED_SYRINGE | INTRAMUSCULAR | Status: DC
  Administered 2021-01-24 – 2021-01-30 (×7): 40 mg via SUBCUTANEOUS
  Filled 2021-01-23 (×7): qty 0.4

## 2021-01-23 MED ORDER — OXYBUTYNIN CHLORIDE ER 5 MG PO TB24
10.0000 mg | ORAL_TABLET | Freq: Every day | ORAL | Status: DC
  Administered 2021-01-24 – 2021-01-30 (×7): 10 mg via ORAL
  Filled 2021-01-23 (×7): qty 2

## 2021-01-23 MED ORDER — PRAVASTATIN SODIUM 10 MG PO TABS
20.0000 mg | ORAL_TABLET | Freq: Every morning | ORAL | Status: DC
  Administered 2021-01-24 – 2021-01-30 (×7): 20 mg via ORAL
  Filled 2021-01-23 (×7): qty 2

## 2021-01-23 MED ORDER — PREGABALIN 75 MG PO CAPS
75.0000 mg | ORAL_CAPSULE | Freq: Every day | ORAL | Status: DC
  Administered 2021-01-24 – 2021-01-30 (×7): 75 mg via ORAL
  Filled 2021-01-23 (×7): qty 1

## 2021-01-23 MED ORDER — VITAMIN D 25 MCG (1000 UNIT) PO TABS
2000.0000 [IU] | ORAL_TABLET | Freq: Every day | ORAL | Status: DC
  Administered 2021-01-24 – 2021-01-30 (×7): 2000 [IU] via ORAL
  Filled 2021-01-23 (×7): qty 2

## 2021-01-23 MED ORDER — PREGABALIN 75 MG PO CAPS
150.0000 mg | ORAL_CAPSULE | Freq: Every day | ORAL | Status: DC
  Administered 2021-01-23 – 2021-01-29 (×7): 150 mg via ORAL
  Filled 2021-01-23 (×7): qty 2

## 2021-01-23 MED ORDER — ACETAMINOPHEN 650 MG RE SUPP: 650.0000 mg | Freq: Four times a day (QID) | RECTAL | Status: DC | PRN

## 2021-01-23 MED ORDER — LEVETIRACETAM 250 MG PO TABS
250.0000 mg | ORAL_TABLET | Freq: Two times a day (BID) | ORAL | Status: DC
  Administered 2021-01-23 – 2021-01-30 (×14): 250 mg via ORAL
  Filled 2021-01-23 (×13): qty 1

## 2021-01-23 MED ORDER — VANCOMYCIN HCL IN DEXTROSE 1-5 GM/200ML-% IV SOLN
1000.0000 mg | Freq: Three times a day (TID) | INTRAVENOUS | Status: DC
  Administered 2021-01-24 – 2021-01-26 (×7): 1000 mg via INTRAVENOUS
  Filled 2021-01-23 (×7): qty 200

## 2021-01-23 MED ORDER — VANCOMYCIN HCL 2000 MG/400ML IV SOLN
2000.0000 mg | Freq: Once | INTRAVENOUS | Status: AC
  Administered 2021-01-23: 2000 mg via INTRAVENOUS
  Filled 2021-01-23: qty 400

## 2021-01-23 MED ORDER — LACTATED RINGERS IV SOLN: INTRAVENOUS | Status: DC

## 2021-01-23 MED ORDER — SODIUM CHLORIDE 0.9 % IV SOLN: 2.0000 g | Freq: Once | INTRAVENOUS | Status: DC

## 2021-01-23 MED ORDER — PREGABALIN 75 MG PO CAPS: 75.0000 mg | ORAL_CAPSULE | ORAL | Status: DC

## 2021-01-23 NOTE — ED Triage Notes (Signed)
Pt from [facility]. Per facility, pt has had intermittent fever all day. Highest 104. No relief with tylenol. Also states issue with foley catheter and discharge

## 2021-01-23 NOTE — ED Provider Notes (Signed)
[facility] EMERGENCY DEPARTMENT Provider Note   CSN: [id number] Arrival date & time: 01/23/21  1804     History Chief Complaint  Patient presents with  . Fever    Vincent Anthony is a 50 y.o. male.  Patient has a Foley catheter in because he has multiple sclerosis.  He developed a fever in the last day and weakness.  The history is provided by the patient and medical records. No language interpreter was used.  Fever Max temp prior to arrival:  101 Temp source:  Oral Severity:  Moderate Onset quality:  Sudden Timing:  Constant Progression:  Unchanged Chronicity:  New Relieved by:  Nothing Worsened by:  Nothing Ineffective treatments:  None tried Associated symptoms: no chest pain, no congestion, no cough, no diarrhea, no headaches and no rash        Past Medical History:  Diagnosis Date  . Arthritis   . Elevated LFTs   . Elevated liver enzymes   . GERD (gastroesophageal reflux disease)   . Headache   . MS (multiple sclerosis) (HCC)   . Neurogenic bladder   . Tremor     Patient Active Problem List   Diagnosis Date Noted  . Bacteremia   . Immunosuppressed status (HCC)   . Septic shock (HCC) 12/29/2020  . Seizures (HCC) 11/18/2020  . Hyponatremia 11/18/2020  . AKI (acute kidney injury) (HCC) 11/18/2020  . Acute respiratory failure with hypoxia (HCC) 11/18/2020  . Pneumonia due to COVID-19 virus 11/17/2020  . MS (multiple sclerosis) (HCC) 03/08/2018  . 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 History of urinary stone 02/03/2018  . Urge incontinence of urine 02/03/2018  . Abdominal pain 01/05/2017  . Elevated LFTs 11/05/2016  . Neuropathic pain 05/23/2015  . Arthralgia of both knees 05/23/2015  . Urinary tract stones 01/03/2015  . Chronic pain syndrome 03/20/2014  . 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 Bilateral knee pain 01/29/2013  . 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 Ongoing use of possibly toxic medication 10/11/2012  . Acne 01/13/2012   . Seborrheic dermatitis 01/13/2012  . Neurogenic bladder 11/13/2011  . Urinary urgency 11/13/2011    Past Surgical History:  Procedure Laterality Date  . COLONOSCOPY     2-4 years ago  . HERNIA REPAIR    . KNEE SURGERY    . TESTICLE REMOVAL         Family History  Problem Relation Age of Onset  . Heart attack Mother   . Diabetes Father   . Hypertension Father   . Colon cancer Neg Hx     Social History   Tobacco Use  . Smoking status: Never Smoker  . Smokeless tobacco: Never Used  Substance Use Topics  . Alcohol use: No    Comment: occasionally  . Drug use: No    Home Medications Prior to Admission medications   Medication Sig Start Date End Date Taking? Authorizing Provider  amitriptyline (ELAVIL) 10 MG tablet Take 1 tablet (10 mg total) by mouth at bedtime. Hold until linezolid course has finished 01/06/21   Pokhrel, Laxman, MD  artificial tears (LACRILUBE) OINT ophthalmic ointment Place into both eyes every 4 (four) hours as needed for dry eyes. 01/06/21   Pokhrel, Rebekah Chesterfield, MD  baclofen (LIORESAL) 10 MG tablet Take 1 tablet by mouth 3 (three) times daily. 11/09/14   [provider]  cetirizine (ZYRTEC) 10 MG tablet Take 10 mg by mouth daily.    [provider]  Cholecalciferol (VITAMIN D3) 50 MCG (2000 UT) TABS  Take 2,000 Units by mouth in the morning.    [provider]  hyoscyamine (LEVBID) 0.375 MG 12 hr tablet TAKE (1) TABLET BYMOUTH EVERY TWELVE HOURS. 09/16/20   Gelene Mink, NP  levETIRAcetam (KEPPRA) 250 MG tablet Take 1 tablet by mouth 2 (two) times daily. 11/09/14   [provider]  linaclotide Karlene Einstein) 145 MCG CAPS capsule Take 1 capsule (145 mcg total) by mouth daily before breakfast. Skip a dose if diarrhoea (loose stools)>2/times a day 01/06/21   Pokhrel, Rebekah Chesterfield, MD  lubiprostone (AMITIZA) 8 MCG capsule Take 1 capsule (8 mcg total) by mouth 2 (two) times daily with a meal. Skip the dose if diarrhoea >2/day 01/06/21    Pokhrel, Rebekah Chesterfield, MD  metFORMIN (GLUCOPHAGE) 500 MG tablet Take 500 mg by mouth 2 (two) times daily.    [provider]  Ocrelizumab (OCREVUS IV) Inject into the vein. Every 6 months for MS    [provider]  ondansetron (ZOFRAN) 4 MG tablet Take 1 tablet (4 mg total) by mouth every 8 (eight) hours as needed for nausea or vomiting. 01/06/21 01/06/22  Pokhrel, Rebekah Chesterfield, MD  oxybutynin (DITROPAN-XL) 10 MG 24 hr tablet Take 10 mg by mouth daily.    [provider]  pantoprazole (PROTONIX) 40 MG tablet Take 1 tablet (40 mg total) by mouth at bedtime. 01/06/21   Pokhrel, Rebekah Chesterfield, MD  potassium chloride SA (KLOR-CON) 20 MEQ tablet Take 1 tablet (20 mEq total) by mouth daily for 10 doses. 01/06/21 01/16/21  Pokhrel, Rebekah Chesterfield, MD  pravastatin (PRAVACHOL) 20 MG tablet Take 20 mg by mouth every morning.     [provider]  pregabalin (LYRICA) 75 MG capsule Take 75 mg by mouth See admin instructions. 1 capsule in morning and 2 capsules at bedtime    [provider]  senna (SENOKOT) 8.6 MG tablet Take 2 tablets (17.2 mg total) by mouth 2 (two) times daily. Hold for loose stools >2/day 01/06/21   Pokhrel, Rebekah Chesterfield, MD  simethicone (MYLICON) 40 MG/0.6ML drops Take 0.6 mLs (40 mg total) by mouth 4 (four) times daily for 10 days. 01/06/21 01/16/21  Pokhrel, Rebekah Chesterfield, MD    Allergies    Celebrex [celecoxib], Vioxx [rofecoxib], Zanaflex [tizanidine hcl], and Clindamycin  Review of Systems   Review of Systems  Constitutional: Positive for fever. Negative for appetite change and fatigue.  HENT: Negative for congestion, ear discharge and sinus pressure.   Eyes: Negative for discharge.  Respiratory: Negative for cough.   Cardiovascular: Negative for chest pain.  Gastrointestinal: Negative for abdominal pain and diarrhea.  Genitourinary: Negative for frequency and hematuria.  Musculoskeletal: Negative for back pain.  Skin: Negative for rash.  Neurological: Negative for seizures and  headaches.  Psychiatric/Behavioral: Negative for hallucinations.    Physical Exam Updated Vital Signs BP 110/65   Pulse (!) 105   Temp (!) 100.6 F (38.1 C) (Rectal)   Resp (!) 27   Ht 6\' 3"  (1.905 m)   Wt 92.1 kg   SpO2 97%   BMI 25.37 kg/m   Physical Exam Vitals reviewed.  Constitutional:      Appearance: He is well-developed.  HENT:     Head: Normocephalic.     Nose: Nose normal.  Eyes:     General: No scleral icterus.    Conjunctiva/sclera: Conjunctivae normal.  Neck:     Thyroid: No thyromegaly.  Cardiovascular:     Rate and Rhythm: Normal rate and regular rhythm.     Heart sounds: No murmur heard.  No friction rub. No gallop.   Pulmonary:     Breath sounds: No stridor. No wheezing or rales.  Chest:     Chest wall: No tenderness.  Abdominal:     General: There is no distension.     Tenderness: There is no abdominal tenderness. There is no rebound.  Genitourinary:    Comments: Foley catheter present Musculoskeletal:        General: Normal range of motion.     Cervical back: Neck supple.  Lymphadenopathy:     Cervical: No cervical adenopathy.  Skin:    Findings: No erythema or rash.  Neurological:     Mental Status: He is alert and oriented to person, place, and time.     Motor: No abnormal muscle tone.     Coordination: Coordination normal.  Psychiatric:        Behavior: Behavior normal.     ED Results / Procedures / Treatments   Labs (all labs ordered are listed, but only abnormal results are displayed) Labs Reviewed  COMPREHENSIVE METABOLIC PANEL - Abnormal; Notable for the following components:      Result Value   Glucose, Bld 122 (*)    Calcium 8.8 (*)    Albumin 3.3 (*)    All other components within normal limits  CBC WITH DIFFERENTIAL/PLATELET - Abnormal; Notable for the following components:   WBC 18.1 (*)    RBC 3.92 (*)    Hemoglobin 11.0 (*)    HCT 34.9 (*)    RDW 18.1 (*)    Neutro Abs 15.5 (*)    Monocytes Absolute 1.3 (*)     Abs Immature Granulocytes 0.13 (*)    All other components within normal limits  URINALYSIS, ROUTINE W REFLEX MICROSCOPIC - Abnormal; Notable for the following components:   APPearance CLOUDY (*)    Hgb urine dipstick SMALL (*)    Protein, ur 100 (*)    Leukocytes,Ua LARGE (*)    WBC, UA >50 (*)    Bacteria, UA RARE (*)    Non Squamous Epithelial 0-5 (*)    All other components within normal limits  RESP PANEL BY RT-PCR (FLU A&B, COVID) ARPGX2  CULTURE, BLOOD (ROUTINE X 2)  CULTURE, BLOOD (ROUTINE X 2)  URINE CULTURE  LACTIC ACID, PLASMA  PROTIME-INR  APTT    EKG None  Radiology DG Chest Port 1 View  Result Date: 01/23/2021 CLINICAL DATA:  Questionable sepsis, fever, negative COVID. EXAM: PORTABLE CHEST 1 VIEW COMPARISON:  01/02/2021 FINDINGS: Shallow inspiration. Heart size and pulmonary vascularity are normal for technique. No airspace disease or consolidation in the lungs. No pleural effusions. No pneumothorax. Mediastinal contours appear intact. Thoracic scoliosis convex towards the right. IMPRESSION: No active disease. Electronically Signed   By: Burman NievesWilliam  Stevens M.D.   On: 01/23/2021 19:05    Procedures Procedures   Medications Ordered in ED Medications  lactated ringers infusion (has no administration in time range)  lactated ringers bolus 1,000 mL (1,000 mLs Intravenous New Bag/Given 01/23/21 1839)    And  lactated ringers bolus 1,000 mL (1,000 mLs Intravenous New Bag/Given 01/23/21 1922)    And  lactated ringers bolus 1,000 mL (1,000 mLs Intravenous New Bag/Given 01/23/21 1922)  cefTRIAXone (ROCEPHIN) 2 g in sodium chloride 0.9 % 100 mL IVPB (0 g Intravenous Stopped 01/23/21 1939)  acetaminophen (TYLENOL) tablet 650 mg (650 mg Oral Given 01/23/21 1906)    ED Course  I have reviewed the triage vital signs and the nursing notes.  Pertinent  labs & imaging results that were available during my care of the patient were reviewed by me and considered in my medical decision  making (see chart for details).    MDM Rules/Calculators/A&P                          Patient with urinary tract infection from Foley catheter.  He will be admitted for IV antibiotics by medicine Final Clinical Impression(s) / ED Diagnoses Final diagnoses:  Acute cystitis with hematuria    Rx / DC Orders ED Discharge Orders    None       Bethann Berkshire, MD 01/23/21 2021

## 2021-01-23 NOTE — Progress Notes (Signed)
Pharmacy Antibiotic Note  Vincent Anthony is a 50 y.o. male admitted on 01/23/2021 with sepsis.  Pharmacy has been consulted for vancomycin dosing.  Plan: Vancomycin 1000mg   IV every 8 hours.  Goal trough 15-20 mcg/mL.  Height: 6\' 3"  (190.5 cm) Weight: 92.1 kg (203 lb) IBW/kg (Calculated) : 84.5  Temp (24hrs), Avg:100.8 F (38.2 C), Min:100.6 F (38.1 C), Max:100.9 F (38.3 C)  Recent Labs  Lab 01/23/21 1835  WBC 18.1*  CREATININE 1.10  LATICACIDVEN 1.9    Estimated Creatinine Clearance: 96 mL/min (by C-G formula based on SCr of 1.1 mg/dL).    Allergies  Allergen Reactions  . Celebrex [Celecoxib] Nausea And Vomiting  . Vioxx [Rofecoxib] Nausea And Vomiting  . Zanaflex [Tizanidine Hcl] Nausea And Vomiting  . Clindamycin Diarrhea    Antimicrobials this admission: 4/28 vancomycin >>  4/28 ceftriaxone x 1   Microbiology results: 4/28 BCx: sent 4/28 UCx: sent  4/28 Resp Panel: negative   Thank you for allowing pharmacy to be a part of this patient's care.  5/28 Vincent Anthony 01/23/2021 8:34 PM

## 2021-01-23 NOTE — Sepsis Progress Note (Signed)
Sepsis protocol is being followed by [facility]. 

## 2021-01-23 NOTE — H&P (Signed)
TRH H&P   Patient Demographics:    Vincent Anthony, is a 50 y.o. male  MRN: [id number]   DOB - October 14, 1970  Admit Date - 01/23/2021  Outpatient Primary MD for the patient is Smith RobertKikel, Stephen, MD  Referring MD/NP/PA: Dr Estell HarpinZammit    [facility]  Chief Complaint  Patient presents with  . Fever      HPI:    Vincent Miniumhomas Metheny  is a 50 y.o. male, medical history of UltraPulse sclerosis, COVID-pneumonia in February, diabetes mellitus, with recent hospitalization due to sepsis from UTI, retention with Foley catheter for last 4558-month, he presents from facility due to fever, progressive weakness, patient to ports he was at his usual state of health as yesterday, he is bedbound at baseline, reported this morning was feeling weak, tired, and he had fever of 104 at the facility which brought him to ED, he denies any cough, shortness of breath, abdominal pain, nausea or vomiting, he has chronic indwelling Foley. - in ED patient had fever 100.9, white blood cell elevated at 18 K, chest x-ray with no acute findings, UA was suggestive of UTI, blood cultures, urine cultures were sent, [facility] consulted to admit.    Review of systems:    In addition to the HPI above, Presents with fever and chills No Headache, No changes with Vision or hearing, No problems swallowing food or Liquids, No Chest pain, Cough or Shortness of Breath, No Abdominal pain, No Nausea or Vommitting, Bowel movements are regular, No Blood in stool or Urine, No dysuria, No new skin rashes or bruises, No new joints pains-aches,  No new focal weakness, tingling, numbness in any extremity, No recent weight gain or loss, No polyuria, polydypsia or polyphagia, No significant Mental Stressors.  A full 10 point Review of Systems was done, except as stated above, all other Review of Systems were  negative.   With Past History of the following :    Past Medical History:  Diagnosis Date  . Arthritis   . Elevated LFTs   . Elevated liver enzymes   . GERD (gastroesophageal reflux disease)   . Headache   . MS (multiple sclerosis) (HCC)   . Neurogenic bladder   . Tremor       Past Surgical History:  Procedure Laterality Date  . COLONOSCOPY     2-4 years ago  . HERNIA REPAIR    . KNEE SURGERY    . TESTICLE REMOVAL        Social History:     Social History   Tobacco Use  . Smoking status: Never Smoker  . Smokeless tobacco: Never Used  Substance Use Topics  . Alcohol use: No    Comment: occasionally       Family History :     Family History  Problem Relation Age of Onset  . Heart attack Mother   .  Diabetes Father   . Hypertension Father   . Colon cancer Neg Hx       Home Medications:   Prior to Admission medications   Medication Sig Start Date End Date Taking? Authorizing Provider  amitriptyline (ELAVIL) 10 MG tablet Take 1 tablet (10 mg total) by mouth at bedtime. Hold until linezolid course has finished 01/06/21   Pokhrel, Laxman, MD  artificial tears (LACRILUBE) OINT ophthalmic ointment Place into both eyes every 4 (four) hours as needed for dry eyes. 01/06/21   Pokhrel, Rebekah Chesterfield, MD  baclofen (LIORESAL) 10 MG tablet Take 1 tablet by mouth 3 (three) times daily. 11/09/14   [provider]  cetirizine (ZYRTEC) 10 MG tablet Take 10 mg by mouth daily.    [provider]  Cholecalciferol (VITAMIN D3) 50 MCG (2000 UT) TABS Take 2,000 Units by mouth in the morning.    [provider]  hyoscyamine (LEVBID) 0.375 MG 12 hr tablet TAKE (1) TABLET BYMOUTH EVERY TWELVE HOURS. 09/16/20   Gelene Mink, NP  levETIRAcetam (KEPPRA) 250 MG tablet Take 1 tablet by mouth 2 (two) times daily. 11/09/14   [provider]  linaclotide Karlene Einstein) 145 MCG CAPS capsule Take 1 capsule (145 mcg total) by mouth daily before breakfast. Skip a dose if  diarrhoea (loose stools)>2/times a day 01/06/21   Pokhrel, Rebekah Chesterfield, MD  lubiprostone (AMITIZA) 8 MCG capsule Take 1 capsule (8 mcg total) by mouth 2 (two) times daily with a meal. Skip the dose if diarrhoea >2/day 01/06/21   Pokhrel, Rebekah Chesterfield, MD  metFORMIN (GLUCOPHAGE) 500 MG tablet Take 500 mg by mouth 2 (two) times daily.    [provider]  Ocrelizumab (OCREVUS IV) Inject into the vein. Every 6 months for MS    [provider]  ondansetron (ZOFRAN) 4 MG tablet Take 1 tablet (4 mg total) by mouth every 8 (eight) hours as needed for nausea or vomiting. 01/06/21 01/06/22  Pokhrel, Rebekah Chesterfield, MD  oxybutynin (DITROPAN-XL) 10 MG 24 hr tablet Take 10 mg by mouth daily.    [provider]  pantoprazole (PROTONIX) 40 MG tablet Take 1 tablet (40 mg total) by mouth at bedtime. 01/06/21   Pokhrel, Rebekah Chesterfield, MD  potassium chloride SA (KLOR-CON) 20 MEQ tablet Take 1 tablet (20 mEq total) by mouth daily for 10 doses. 01/06/21 01/16/21  Pokhrel, Rebekah Chesterfield, MD  pravastatin (PRAVACHOL) 20 MG tablet Take 20 mg by mouth every morning.     [provider]  pregabalin (LYRICA) 75 MG capsule Take 75 mg by mouth See admin instructions. 1 capsule in morning and 2 capsules at bedtime    [provider]  senna (SENOKOT) 8.6 MG tablet Take 2 tablets (17.2 mg total) by mouth 2 (two) times daily. Hold for loose stools >2/day 01/06/21   Pokhrel, Rebekah Chesterfield, MD  simethicone (MYLICON) 40 MG/0.6ML drops Take 0.6 mLs (40 mg total) by mouth 4 (four) times daily for 10 days. 01/06/21 01/16/21  Joycelyn Das, MD     Allergies:     Allergies  Allergen Reactions  . Celebrex [Celecoxib] Nausea And Vomiting  . Vioxx [Rofecoxib] Nausea And Vomiting  . Zanaflex [Tizanidine Hcl] Nausea And Vomiting  . Clindamycin Diarrhea     Physical Exam:   Vitals  Blood pressure 120/69, pulse (!) 107, temperature (!) 100.6 F (38.1 C), temperature source Rectal, resp. rate 19, height 6\' 3"  (1.905 m), weight 92.1 kg,  SpO2 96 %.   1. General developed male, laying in bed, no apparent distress  2. Normal affect and insight, Not Suicidal or Homicidal, Awake Alert, Oriented X 3.  3. No F.N deficits, ALL C.Nerves Intact, patient with extremity atrophy/mild contraction and generalized weakness, bilateral lower extremity weakness.   4. Ears and Eyes appear Normal, Conjunctivae clear, PERRLA. Moist Oral Mucosa.  5. Supple Neck, No JVD, No cervical lymphadenopathy appriciated, No Carotid Bruits.  6. Symmetrical Chest wall movement, Good air movement bilaterally, CTAB.  7. RRR, No Gallops, Rubs or Murmurs, No Parasternal Heave.  8. Positive Bowel Sounds, Abdomen Soft, No tenderness, No organomegaly appriciated,No rebound -guarding or rigidity.  Foley catheter present, no discharge, he had prostatic left side testicle  9.  No Cyanosis, Normal Skin Turgor, No Skin Rash or Bruise.  10. joints appear normal , no effusions  11. No Palpable Lymph Nodes in Neck or Axillae     Data Review:    CBC Recent Labs  Lab 01/23/21 1835  WBC 18.1*  HGB 11.0*  HCT 34.9*  PLT 308  MCV 89.0  MCH 28.1  MCHC 31.5  RDW 18.1*  LYMPHSABS 0.9  MONOABS 1.3*  EOSABS 0.1  BASOSABS 0.0   ------------------------------------------------------------------------------------------------------------------  Chemistries  Recent Labs  Lab 01/23/21 1835  NA 137  K 3.8  CL 99  CO2 28  GLUCOSE 122*  BUN 13  CREATININE 1.10  CALCIUM 8.8*  AST 16  ALT 16  ALKPHOS 94  BILITOT 0.8   ------------------------------------------------------------------------------------------------------------------ estimated creatinine clearance is 96 mL/min (by C-G formula based on SCr of 1.1 mg/dL). ------------------------------------------------------------------------------------------------------------------ No results for input(s): TSH, T4TOTAL, T3FREE, THYROIDAB in the last 72 hours.  Invalid input(s): FREET3  Coagulation  profile Recent Labs  Lab 01/23/21 1835  INR 1.1   ------------------------------------------------------------------------------------------------------------------- No results for input(s): DDIMER in the last 72 hours. -------------------------------------------------------------------------------------------------------------------  Cardiac Enzymes No results for input(s): CKMB, TROPONINI, MYOGLOBIN in the last 168 hours.  Invalid input(s): CK ------------------------------------------------------------------------------------------------------------------ No results found for: BNP   ---------------------------------------------------------------------------------------------------------------  Urinalysis    Component Value Date/Time   COLORURINE YELLOW 01/23/2021 1822   APPEARANCEUR CLOUDY (A) 01/23/2021 1822   LABSPEC 1.015 01/23/2021 1822   PHURINE 7.0 01/23/2021 1822   GLUCOSEU NEGATIVE 01/23/2021 1822   HGBUR SMALL (A) 01/23/2021 1822   BILIRUBINUR NEGATIVE 01/23/2021 1822   BILIRUBINUR neg 08/06/2020 1331   KETONESUR NEGATIVE 01/23/2021 1822   PROTEINUR 100 (A) 01/23/2021 1822   UROBILINOGEN 1.0 08/06/2020 1331   UROBILINOGEN 1.0 01/13/2011 2358   NITRITE NEGATIVE 01/23/2021 1822   LEUKOCYTESUR LARGE (A) 01/23/2021 1822    ----------------------------------------------------------------------------------------------------------------   Imaging Results:    DG Chest Port 1 View  Result Date: 01/23/2021 CLINICAL DATA:  Questionable sepsis, fever, negative COVID. EXAM: PORTABLE CHEST 1 VIEW COMPARISON:  01/02/2021 FINDINGS: Shallow inspiration. Heart size and pulmonary vascularity are normal for technique. No airspace disease or consolidation in the lungs. No pleural effusions. No pneumothorax. Mediastinal contours appear intact. Thoracic scoliosis convex towards the right. IMPRESSION: No active disease. Electronically Signed   By: Burman Nieves M.D.   On:  01/23/2021 19:05    My personal review of EKG: Rhythm Sinus tachycardia, Rate  111 /min, QTc 412 , no Acute ST changes   Assessment & Plan:    Active Problems:   MS (multiple sclerosis) (HCC)   Absent testis, acquired   Chronic pain syndrome   Neurogenic bladder   Immunosuppressed status (HCC)   UTI (urinary tract infection)    UTI due to chronic indwelling Foley catheter  -Presents with fever 104 at the facility,  100.9 at the hospital, leukocytosis, tachycardia -She is related to high from chronic indwelling Foley catheter, follow-up blood cultures. -Recent admission blood culture growing E. coli, continue with Rocephin. -During recent admission he had bacteremia with Enterococcus, continue with vancomycin for now and follow-up blood cultures. -Continue with IV fluids -Please see further discussion below regarding Foley under neurogenic bladder.  Neurogenic bladder -Patient with diagnosis of neurogenic bladder, most like related to MS. --Foley catheter was inserted couple months ago at the facility due to incontinence, like to try him with out of Foley catheter to see if he has any retention or not. -I have discussed with urology Dr. Mena Goes regarding the plan to discontinue his Foley catheter, given his acute infection and UTI, he recommends to keep the Foley for next few days of IV antibiotics till blood infection clears, then we will try to discontinue Foley catheter while he still on antibiotics 1 day early in the morning to watch if he has any retention with close monitoring with bladder scan monitoring, and possible renal ultrasound as well if there is evidence of retention.  So given we will attempt voiding trial in few  days, I will hold on exchanging the Foley catheter while in ED    Patient  on Keppra long-term for adjuvant pain control. No history of seizures in the past.    Diabetes mellitus type 2. On metformin and keep on insulin sliding scale  History of  multiple sclerosis. Continue Lyrica and baclofen.  -Continue with PT/OT  Hyperlipidemia. Continue statins.   Constipation Hold Linzess Senokot, Amitiza if loose stools more than 2 times per day.   Anxiety On Xanax at home.  Resume.   DVT Prophylaxis  Lovenox  AM Labs Ordered, also please review Full Orders  Family Communication: Admission, patients condition and plan of care including tests being ordered have been discussed with the patient and wife at bedsdie who indicate understanding and agree with the plan and Code Status.  Code Status Full  Likely DC to  SNF  Condition GUARDED    Consults called: none    Admission status: inpatient    Time spent in minutes : 60 minutes   Huey Bienenstock M.D on 01/23/2021 at 8:58 PM   [facility] - Office  [phone]

## 2021-01-24 DIAGNOSIS — D849 Immunodeficiency, unspecified: Secondary | ICD-10-CM

## 2021-01-24 LAB — BASIC METABOLIC PANEL
Anion gap: 8 (ref 5–15)
BUN: 10 mg/dL (ref 6–20)
CO2: 26 mmol/L (ref 22–32)
Calcium: 8.4 mg/dL — ABNORMAL LOW (ref 8.9–10.3)
Chloride: 107 mmol/L (ref 98–111)
Creatinine, Ser: 1.03 mg/dL (ref 0.61–1.24)
GFR, Estimated: >60 mL/min (ref >60)
Glucose, Bld: 113 mg/dL — ABNORMAL HIGH (ref 70–99)
Potassium: 3.9 mmol/L (ref 3.5–5.1)
Sodium: 141 mmol/L (ref 135–145)

## 2021-01-24 LAB — CBC
HCT: 31.7 % — ABNORMAL LOW (ref 39.0–52.0)
Hemoglobin: 9.7 g/dL — ABNORMAL LOW (ref 13.0–17.0)
MCH: 27.9 pg (ref 26.0–34.0)
MCHC: 30.6 g/dL (ref 30.0–36.0)
MCV: 91.1 fL (ref 80.0–100.0)
Platelets: 251 10*3/uL (ref 150–400)
RBC: 3.48 MIL/uL — ABNORMAL LOW (ref 4.22–5.81)
RDW: 18.4 % — ABNORMAL HIGH (ref 11.5–15.5)
WBC: 14.9 10*3/uL — ABNORMAL HIGH (ref 4.0–10.5)
nRBC: 0 % (ref 0.0–0.2)

## 2021-01-24 LAB — HEMOGLOBIN A1C
Hgb A1c MFr Bld: 5.5 % (ref 4.8–5.6)
Mean Plasma Glucose: 111.15 mg/dL

## 2021-01-24 LAB — GLUCOSE, CAPILLARY
Glucose-Capillary: 93 mg/dL (ref 70–99)
Glucose-Capillary: 97 mg/dL (ref 70–99)

## 2021-01-24 MED ORDER — CHLORHEXIDINE GLUCONATE CLOTH 2 % EX PADS
6.0000 | MEDICATED_PAD | Freq: Every day | CUTANEOUS | Status: DC
  Administered 2021-01-25 – 2021-01-30 (×6): 6 via TOPICAL

## 2021-01-24 MED ORDER — SODIUM CHLORIDE 0.9 % IV SOLN
2.0000 g | INTRAVENOUS | Status: DC
  Administered 2021-01-24 – 2021-01-25 (×2): 2 g via INTRAVENOUS
  Filled 2021-01-24 (×2): qty 20

## 2021-01-24 MED ORDER — LACTATED RINGERS IV SOLN: INTRAVENOUS | Status: DC

## 2021-01-24 MED ORDER — SODIUM CHLORIDE 0.9 % IV BOLUS
500.0000 mL | Freq: Once | INTRAVENOUS | Status: AC
  Administered 2021-01-24: 500 mL via INTRAVENOUS

## 2021-01-24 NOTE — Care Management Important Message (Signed)
Important Message  Patient Details  Name: Vincent Anthony MRN: [id number] Date of Birth: December 25, 1970   Medicare Important Message Given:  Yes     [name] 01/24/2021, 2:28 PM

## 2021-01-24 NOTE — Plan of Care (Signed)
  Problem: Acute Rehab PT Goals(only PT should resolve) Goal: Pt will Roll Supine to Side Outcome: Progressing Flowsheets (Taken 01/24/2021 1419) Pt will Roll Supine to Side: with mod assist Goal: Pt Will Go Sit To Supine/Side Outcome: Progressing Flowsheets (Taken 01/24/2021 1419) Pt will go Sit to Supine/Side: with moderate assist Goal: Patient Will Perform Sitting Balance Outcome: Progressing Flowsheets (Taken 01/24/2021 1419) Patient will perform sitting balance: with modified independence Goal: Pt Will Transfer Bed To Chair/Chair To Bed Outcome: Progressing Flowsheets (Taken 01/24/2021 1419) Pt will Transfer Bed to Chair/Chair to Bed:  with mod assist  with max assist Note: Sliding board   2:20 PM, 01/24/21 Ocie Bob, MPT Physical Therapist with [facility] [phone] office [phone] mobile phone

## 2021-01-24 NOTE — Progress Notes (Signed)
MD notified of patients blood pressure of 90/59. Patient reports feeling fine he is up watching T.V.. MD ordered 500 ml bolus to be given. Will continue to monitor.

## 2021-01-24 NOTE — TOC Initial Note (Signed)
Transition of Care [facility]) - Initial/Assessment Note    Patient Details  Name: Vincent Anthony MRN: [id number] Date of Birth: 1971-06-01  Transition of Care [facility]) CM/SW Contact:    Annice Needy, LCSW Phone Number: 01/24/2021, 4:03 PM  Clinical Narrative:                 Patient is a LTC at [place]. Has been in LTC for three months. Spouse is interested in additional LTC options.  Referral made.   Expected Discharge Plan: Skilled Nursing Facility Barriers to Discharge: Continued Medical Work up   Patient Goals and CMS Choice Patient states their goals for this hospitalization and ongoing recovery are:: return to LTC      Expected Discharge Plan and Services Expected Discharge Plan: Skilled Nursing Facility       Living arrangements for the past 2 months: Skilled Nursing Facility                                      Prior Living Arrangements/Services Living arrangements for the past 2 months: Skilled Nursing Facility Lives with:: Facility Resident Patient language and need for interpreter reviewed:: Yes Do you feel safe going back to the place where you live?: Yes      Need for Family Participation in Patient Care: Yes (Comment) Care giver support system in place?: Yes (comment)   Criminal Activity/Legal Involvement Pertinent to Current Situation/Hospitalization: No - Comment as needed  Activities of Daily Living Home Assistive Devices/Equipment: None ADL Screening (condition at time of admission) Patient's cognitive ability adequate to safely complete daily activities?: Yes Is the patient deaf or have difficulty hearing?: No Does the patient have difficulty seeing, even when wearing glasses/contacts?: No Does the patient have difficulty concentrating, remembering, or making decisions?: No Patient able to express need for assistance with ADLs?: Yes Does the patient have difficulty dressing or bathing?: Yes Independently performs ADLs?:  No Communication: Independent Dressing (OT): Dependent Is this a change from baseline?: Pre-admission baseline Grooming: Dependent Is this a change from baseline?: Pre-admission baseline Feeding: Dependent Is this a change from baseline?: Pre-admission baseline Bathing: Dependent Is this a change from baseline?: Pre-admission baseline Toileting: Dependent Is this a change from baseline?: Pre-admission baseline In/Out Bed: Dependent Is this a change from baseline?: Pre-admission baseline Walks in Home: Dependent Is this a change from baseline?: Pre-admission baseline Does the patient have difficulty walking or climbing stairs?: Yes (pt has MS) Weakness of Legs: Both Weakness of Arms/Hands: Right  Permission Sought/Granted Permission sought to share information with : Family Supports    Share Information with NAME: Eura Radabaugh     Permission granted to share info w Relationship: wife     Emotional Assessment     Affect (typically observed): Appropriate Orientation: : Oriented to Self,Oriented to Place,Oriented to  Time,Oriented to Situation Alcohol / Substance Use: Not Applicable    Admission diagnosis:  UTI (urinary tract infection) [N39.0] Acute cystitis with hematuria [N30.01] Patient Active Problem List   Diagnosis Date Noted  . UTI (urinary tract infection) 01/23/2021  . Bacteremia   . Immunosuppressed status (HCC)   . Septic shock (HCC) 12/29/2020  . Seizures (HCC) 11/18/2020  . Hyponatremia 11/18/2020  . AKI (acute kidney injury) (HCC) 11/18/2020  . Acute respiratory failure with hypoxia (HCC) 11/18/2020  . Pneumonia due to COVID-19 virus 11/17/2020  . MS (multiple sclerosis) (HCC) 03/08/2018  . 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 History of urinary stone 02/03/2018  . Urge incontinence of urine 02/03/2018  . Abdominal pain 01/05/2017  . Elevated LFTs 11/05/2016  . Neuropathic pain 05/23/2015  . Arthralgia of both knees 05/23/2015  . Urinary tract  stones 01/03/2015  . Chronic pain syndrome 03/20/2014  . 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 Bilateral knee pain 01/29/2013  . 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 Ongoing use of possibly toxic medication 10/11/2012  . Acne 01/13/2012  . Seborrheic dermatitis 01/13/2012  . Neurogenic bladder 11/13/2011  . Urinary urgency 11/13/2011   PCP:  [name], MD Pharmacy:   [facility] [place], [place] - [address] [facility] [address] [place] [address] Phone: [phone] Fax: [phone]     Social Determinants of Health (SDOH) Interventions    Readmission Risk Interventions No flowsheet data found.

## 2021-01-24 NOTE — Plan of Care (Addendum)
  Problem: Acute Rehab OT Goals (only OT should resolve) Goal: Pt. Will Perform Grooming Flowsheets (Taken 01/24/2021 1249) Pt Will Perform Grooming: . with modified independence . sitting . with adaptive equipment Goal: Pt. Will Perform Upper Body Dressing Flowsheets (Taken 01/24/2021 1249) Pt Will Perform Upper Body Dressing: . with modified independence . sitting . with adaptive equipment Goal: Pt. Will Perform Lower Body Dressing Flowsheets (Taken 01/24/2021 1249) Pt Will Perform Lower Body Dressing: . with max assist . bed level . with adaptive equipment . sitting/lateral leans . with mod assist Goal: Pt. Will Transfer To Toilet Flowsheets (Taken 01/24/2021 1249) Pt Will Transfer to Toilet: . with transfer board . bedside commode . with max assist . with +2 assist . with total assist Goal: Pt/Caregiver Will Perform Home Exercise Program Flowsheets (Taken 01/24/2021 1249) Pt/caregiver will Perform Home Exercise Program: . Increased ROM . R upper extremity . With minimal assist  Avry Monteleone OT, MOT

## 2021-01-24 NOTE — [place] FL2 (Signed)
[place] MEDICAID FL2 LEVEL OF CARE SCREENING TOOL     IDENTIFICATION  Patient Name: Vincent Anthony Birthdate: [birth year] Sex: male Admission Date (Current Location): 01/23/2021  [facility] and [place] Number:  [employer] and Address:  [facility],  [address] [place] [address]      Provider Number: [id number]  Attending Physician Name and Address:  Cleora Fleet, MD  Relative Name and Phone Number:  Kerri, Asche (Spouse)   [phone]    Current Level of Care: Hospital Recommended Level of Care: Skilled Nursing Facility Prior Approval Number:    Date Approved/Denied:   PASRR Number: [id number]  Discharge Plan: SNF    Current Diagnoses: Patient Active Problem List   Diagnosis Date Noted  . UTI (urinary tract infection) 01/23/2021  . Bacteremia   . Immunosuppressed status (HCC)   . Septic shock (HCC) 12/29/2020  . Seizures (HCC) 11/18/2020  . Hyponatremia 11/18/2020  . AKI (acute kidney injury) (HCC) 11/18/2020  . Acute respiratory failure with hypoxia (HCC) 11/18/2020  . Pneumonia due to COVID-19 virus 11/17/2020  . MS (multiple sclerosis) (HCC) 03/08/2018  . 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 History of urinary stone 02/03/2018  . Urge incontinence of urine 02/03/2018  . Abdominal pain 01/05/2017  . Elevated LFTs 11/05/2016  . Neuropathic pain 05/23/2015  . Arthralgia of both knees 05/23/2015  . Urinary tract stones 01/03/2015  . Chronic pain syndrome 03/20/2014  . 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 Bilateral knee pain 01/29/2013  . 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 Ongoing use of possibly toxic medication 10/11/2012  . Acne 01/13/2012  . Seborrheic dermatitis 01/13/2012  . Neurogenic bladder 11/13/2011  . Urinary urgency 11/13/2011    Orientation RESPIRATION BLADDER Height & Weight     Self,Time,Situation,Place  Normal Indwelling catheter Weight: 203 lb (92.1 kg) Height:  6\' 3"  (190.5 cm)   BEHAVIORAL SYMPTOMS/MOOD NEUROLOGICAL BOWEL NUTRITION STATUS      Continent Diet (carb modified)  AMBULATORY STATUS COMMUNICATION OF NEEDS Skin   Extensive Assist Verbally Normal                       Personal Care Assistance Level of Assistance  Bathing,Dressing,Total care Bathing Assistance: Maximum assistance   Dressing Assistance: Maximum assistance Total Care Assistance: Maximum assistance   Functional Limitations Info             SPECIAL CARE FACTORS FREQUENCY  PT (By licensed PT)     PT Frequency: 5x/week              Contractures Contractures Info: Not present    Additional Factors Info  Code Status,Allergies Code Status Info: Full Allergies Info: celebrex, vioxx, zanaflex, clindamycin           Current Medications (01/24/2021):  This is the current hospital active medication list Current Facility-Administered Medications  Medication Dose Route Frequency Provider Last Rate Last Admin  . acetaminophen (TYLENOL) tablet 650 mg  650 mg Oral Q6H PRN Elgergawy, 01/26/2021, MD       Or  . acetaminophen (TYLENOL) suppository 650 mg  650 mg Rectal Q6H PRN Elgergawy, Leana Roe, MD      . artificial tears (LACRILUBE) ophthalmic ointment   Both Eyes Q4H PRN Elgergawy, Leana Roe, MD      . cefTRIAXone (ROCEPHIN) 2 g in sodium chloride 0.9 % 100 mL IVPB  2 g Intravenous Q24H Johnson, Clanford L, MD      . Chlorhexidine Gluconate Cloth  2 % PADS 6 each  6 each Topical Daily Johnson, Clanford L, MD      . cholecalciferol (VITAMIN D3) tablet 2,000 Units  2,000 Units Oral Daily Elgergawy, Leana Roe, MD   2,000 Units at 01/24/21 [phone]  . enoxaparin (LOVENOX) injection 40 mg  40 mg Subcutaneous Q24H Elgergawy, Leana Roe, MD   40 mg at 01/24/21 3149  . hyoscyamine (LEVBID) 0.375 MG 12 hr tablet 0.375 mg  0.375 mg Oral Q12H Elgergawy, Leana Roe, MD   0.375 mg at 01/24/21 1413  . lactated ringers infusion   Intravenous Continuous Laural Benes, Clanford L, MD 70 mL/hr at 01/24/21 1310  New Bag at 01/24/21 1310  . levETIRAcetam (KEPPRA) tablet 250 mg  250 mg Oral BID Elgergawy, Leana Roe, MD   250 mg at 01/24/21 7026  . linaclotide (LINZESS) capsule 145 mcg  145 mcg Oral QAC breakfast Elgergawy, Leana Roe, MD   145 mcg at 01/24/21 0840  . lubiprostone (AMITIZA) capsule 8 mcg  8 mcg Oral BID WC Elgergawy, Leana Roe, MD   8 mcg at 01/24/21 [phone]  . oxybutynin (DITROPAN-XL) 24 hr tablet 10 mg  10 mg Oral Daily Elgergawy, Leana Roe, MD   10 mg at 01/24/21 8850  . pantoprazole (PROTONIX) EC tablet 40 mg  40 mg Oral QHS Elgergawy, Leana Roe, MD   40 mg at 01/23/21 2325  . pravastatin (PRAVACHOL) tablet 20 mg  20 mg Oral q morning Elgergawy, Leana Roe, MD   20 mg at 01/24/21 0940  . pregabalin (LYRICA) capsule 75 mg  75 mg Oral Daily Elgergawy, Leana Roe, MD   75 mg at 01/24/21 2774   And  . pregabalin (LYRICA) capsule 150 mg  150 mg Oral QHS Elgergawy, Leana Roe, MD   150 mg at 01/23/21 2324  . senna (SENOKOT) tablet 17.2 mg  2 tablet Oral BID Elgergawy, Leana Roe, MD      . vancomycin (VANCOCIN) IVPB 1000 mg/200 mL premix  1,000 mg Intravenous Q8H Elgergawy, Leana Roe, MD 200 mL/hr at 01/24/21 1357 1,000 mg at 01/24/21 1357     Discharge Medications: Please see discharge summary for a list of discharge medications.  Relevant Imaging Results:  Relevant Lab Results:   Additional Information SSN: [id number]  Annice Needy, LCSW

## 2021-01-24 NOTE — Plan of Care (Signed)

## 2021-01-24 NOTE — Evaluation (Signed)
Physical Therapy Evaluation Patient Details Name: Vincent Anthony MRN: [id number] DOB: Jun 12, 1971 Today's Date: 01/24/2021   History of Present Illness  Vincent Anthony  is a 50 y.o. male, medical history of UltraPulse sclerosis, COVID-pneumonia in February, diabetes mellitus, with recent hospitalization due to sepsis from UTI, retention with Foley catheter for last 75-month, he presents from facility due to fever, progressive weakness, patient to ports he was at his usual state of health as yesterday, he is bedbound at baseline, reported this morning was feeling weak, tired, and he had fever of 104 at the facility which brought him to ED, he denies any cough, shortness of breath, abdominal pain, nausea or vomiting, he has chronic indwelling Foley.  - in ED patient had fever 100.9, white blood cell elevated at 18 K, chest x-ray with no acute findings, UA was suggestive of UTI, blood cultures, urine cultures were sent, [facility] hospitalist consulted to admit.    Clinical Impression  Patient demonstrates slow labored movement for sitting up at bedside with limited use of BUE due to weakness,  Unable to attempt standing due to BLE weakness with bilateral heel cord contractures, fair/good return for scooting forward and laterally at bedside with Mod assist.  Patient will benefit from continued physical therapy in hospital and recommended venue below to increase strength, balance, endurance for safe ADLs and gait.     Follow Up Recommendations SNF    Equipment Recommendations  None recommended by PT    Recommendations for Other Services       Precautions / Restrictions Precautions Precautions: Fall Restrictions Weight Bearing Restrictions: No      Mobility  Bed Mobility Overal bed mobility: Needs Assistance Bed Mobility: Supine to Sit;Sit to Supine     Supine to sit: Mod assist;Max assist Sit to supine: Mod assist;Max assist   General bed mobility comments: labored movement, limited  use of BUE due to weakness    Transfers                    Ambulation/Gait                Stairs            Wheelchair Mobility    Modified Rankin (Stroke Patients Only)       Balance Overall balance assessment: Needs assistance Sitting-balance support: Feet supported;No upper extremity supported Sitting balance-Leahy Scale: Fair Sitting balance - Comments: fair/good supporting self with BUE Postural control: Right lateral lean                                   Pertinent Vitals/Pain Pain Assessment: No/denies pain Pain Score: 0-No pain    Home Living Family/patient expects to be discharged to:: Skilled nursing facility                 Additional Comments: Pt is unsure of desired long term placement but sees SNF placement as a short term option.    Prior Function Level of Independence: Needs assistance   Gait / Transfers Assistance Needed: hoyer transfers, working on assisted slideboard transfers, wheelchair mobility  ADL's / Homemaking Assistance Needed: pt assisted by facility for all ADLs.        Hand Dominance   Dominant Hand: Right    Extremity/Trunk Assessment   Upper Extremity Assessment Upper Extremity Assessment: Defer to OT evaluation RUE Deficits / Details: Grossly functional shoulder ROM to 90 degrees. P/ROM  ith ~20* limitation in shoulder flexion and abduction. Able to tolerate full P/ROM motion of wrsit. Minimal active ROM with lack of ability to functionally use hand. RUE Coordination: decreased fine motor;decreased gross motor LUE Deficits / Details: Grossly functional ROM and good strength.    Lower Extremity Assessment Lower Extremity Assessment: Generalized weakness;RLE deficits/detail RLE Deficits / Details: RLE grossly 2+/5 except ankle dorsiflexion 0/5 RLE Sensation: WNL RLE Coordination: decreased fine motor;decreased gross motor LLE Deficits / Details: grossly 2+/5 except ankle DF 0/5 LLE  Sensation: WNL LLE Coordination: decreased fine motor;decreased gross motor    Cervical / Trunk Assessment Cervical / Trunk Assessment: Normal  Communication   Communication: No difficulties  Cognition Arousal/Alertness: Awake/alert Behavior During Therapy: WFL for tasks assessed/performed Overall Cognitive Status: Within Functional Limits for tasks assessed                                        General Comments      Exercises     Assessment/Plan    PT Assessment Patient needs continued PT services  PT Problem List Decreased strength;Decreased activity tolerance;Decreased balance;Decreased mobility;Decreased coordination       PT Treatment Interventions DME instruction;Functional mobility training;Therapeutic activities;Therapeutic exercise;Wheelchair mobility training;Patient/family education;Balance training    PT Goals (Current goals can be found in the Care Plan section)  Acute Rehab PT Goals Patient Stated Goal: return to SNF for rehab PT Goal Formulation: With patient Time For Goal Achievement: 02/07/21 Potential to Achieve Goals: Fair    Frequency Min 3X/week   Barriers to discharge        Co-evaluation PT/OT/SLP Co-Evaluation/Treatment: Yes Reason for Co-Treatment: Complexity of the patient's impairments (multi-system involvement) PT goals addressed during session: Mobility/safety with mobility;Proper use of DME;Balance OT goals addressed during session: ADL's and self-care;Strengthening/ROM       AM-PAC PT "6 Clicks" Mobility  Outcome Measure Help needed turning from your back to your side while in a flat bed without using bedrails?: A Lot Help needed moving from lying on your back to sitting on the side of a flat bed without using bedrails?: A Lot Help needed moving to and from a bed to a chair (including a wheelchair)?: Total Help needed standing up from a chair using your arms (e.g., wheelchair or bedside chair)?: Total Help needed  to walk in hospital room?: Total Help needed climbing 3-5 steps with a railing? : Total 6 Click Score: 8    End of Session   Activity Tolerance: Patient tolerated treatment well;Patient limited by fatigue Patient left: in bed;with call bell/phone within reach Nurse Communication: Mobility status PT Visit Diagnosis: Muscle weakness (generalized) (M62.81);Difficulty in walking, not elsewhere classified (R26.2);Other symptoms and signs involving the nervous system (R29.898)    Time: 6811-5726 PT Time Calculation (min) (ACUTE ONLY): 22 min   Charges:   PT Evaluation $PT Eval Moderate Complexity: 1 Mod PT Treatments $Therapeutic Activity: 8-22 mins        2:18 PM, 01/24/21 Ocie Bob, MPT Physical Therapist with [phone] office [phone] mobile phone

## 2021-01-24 NOTE — Progress Notes (Addendum)
PROGRESS NOTE   Vincent Anthony  OZH:[id number] DOB: 12/30/1970 DOA: 01/23/2021 PCP: Vincent Robert, MD   Chief Complaint  Patient presents with  . Fever   Level of care: Telemetry  Brief Admission History:  50 y.o. male, medical history of UltraPulse sclerosis, COVID-pneumonia in February, diabetes mellitus, with recent hospitalization due to sepsis from UTI, retention with Foley catheter for last 53-month, he presents from facility due to fever, progressive weakness, patient to ports he was at his usual state of health as yesterday, he is bedbound at baseline, reported this morning was feeling weak, tired, and he had fever of 104 at the facility which brought him to ED, he denies any cough, shortness of breath, abdominal pain, nausea or vomiting, he has chronic indwelling Foley.  In ED patient had fever 100.9, white blood cell elevated at 18 K, chest x-ray with no acute findings, UA was suggestive of UTI, blood cultures, urine cultures were sent, [facility] hospitalist consulted to admit.  Assessment & Plan:   Active Problems:   MS (multiple sclerosis) (HCC)   Absent testis, acquired   Chronic pain syndrome   Neurogenic bladder   Immunosuppressed status (HCC)   UTI (urinary tract infection)   Catheter associated UTI - indwelling foley in place, recent history of enterococcal UTI agree with continuing vancomycin pending C&S findings.  Continue supportive measures. Pt reports that he is starting to feel better.    Leukocytosis - WBC trending down with therapy, recheck in AM.   MS associated neurogenic bladder - Foley was placed 2 months back at SNF due to incontinence, Urologist Dr Vincent Anthony was contacted during admission and recommended trial of removing foley in a couple of days and doing a void trial.    MS with chronic pain - He has been on lyrica and baclofen which is continued.   Hyperlipidemia - resumed home statin therapy.   Constipation - he has been resumed on home bowel  regimen.   GAD - remains on home alprazolam.   DVT prophylaxis: enoxaparin  Code Status: full  Family Communication:  Disposition: return SNF when medically stabilized Status is: Inpatient  Remains inpatient appropriate because:IV treatments appropriate due to intensity of illness or inability to take PO and Inpatient level of care appropriate due to severity of illness   Dispo:  Patient From: Skilled Nursing Facility  Planned Disposition: Skilled Nursing Facility  Medically stable for discharge: No      Consultants:     Procedures:     Antimicrobials:  Vancomycin 4/28>> ceftrixaone 4/28>>   Subjective: Pt reports that he is feeling better.   Objective: Vitals:   01/24/21 0309 01/24/21 0450 01/24/21 0544 01/24/21 0553  BP: (!) 95/57 93/80 (!) 93/58 99/68  Pulse: 96 92 92 89  Resp:   17   Temp:   99.2 F (37.3 C)   TempSrc:      SpO2: 98%  99% 95%  Weight:      Height:        Intake/Output Summary (Last 24 hours) at 01/24/2021 1209 Last data filed at 01/24/2021 1005 Gross per 24 hour  Intake 360 ml  Output 1150 ml  Net -790 ml   Filed Weights   01/23/21 1809  Weight: 92.1 kg    Examination:  General exam: chronically ill appearing male, Appears calm and comfortable  Respiratory system: Clear to auscultation. Respiratory effort normal. Cardiovascular system: normal S1 & S2 heard. No JVD, murmurs, rubs, gallops or clicks. No pedal edema. Gastrointestinal system:  Abdomen is nondistended, soft and nontender. No organomegaly or masses felt. Normal bowel sounds heard. GU: foley cath in place. Amber urine seen.  Central nervous system: Alert and oriented x3. Extremities: Symmetric 5 x 5 power. Skin: No rashes, lesions or ulcers Psychiatry: Judgement and insight appear normal. Mood & affect appropriate.   Data Reviewed: I have personally reviewed following labs and imaging studies  CBC: Recent Labs  Lab 01/23/21 1835 01/24/21 0503  WBC 18.1* 14.9*   NEUTROABS 15.5*  --   HGB 11.0* 9.7*  HCT 34.9* 31.7*  MCV 89.0 91.1  PLT 308 251    Basic Metabolic Panel: Recent Labs  Lab 01/23/21 1835 01/24/21 0503  NA 137 141  K 3.8 3.9  CL 99 107  CO2 28 26  GLUCOSE 122* 113*  BUN 13 10  CREATININE 1.10 1.03  CALCIUM 8.8* 8.4*    GFR: Estimated Creatinine Clearance: 102.5 mL/min (by C-G formula based on SCr of 1.03 mg/dL).  Liver Function Tests: Recent Labs  Lab 01/23/21 1835  AST 16  ALT 16  ALKPHOS 94  BILITOT 0.8  PROT 6.6  ALBUMIN 3.3*    CBG: Recent Labs  Lab 01/23/21 2216 01/24/21 0753 01/24/21 1119  GLUCAP 101* 93 97    Recent Results (from the past 240 hour(s))  Resp Panel by RT-PCR (Flu A&B, Covid) Nasopharyngeal Swab     Status: None   Collection Time: 01/23/21  6:22 PM   Specimen: Nasopharyngeal Swab; Nasopharyngeal(NP) swabs in vial transport medium  Result Value Ref Range Status   SARS Coronavirus 2 by RT PCR NEGATIVE NEGATIVE Final    Comment: (NOTE) SARS-CoV-2 target nucleic acids are NOT DETECTED.  The SARS-CoV-2 RNA is generally detectable in upper respiratory specimens during the acute phase of infection. The lowest concentration of SARS-CoV-2 viral copies this assay can detect is 138 copies/mL. A negative result does not preclude SARS-Cov-2 infection and should not be used as the sole basis for treatment or other patient management decisions. A negative result may occur with  improper specimen collection/handling, submission of specimen other than nasopharyngeal swab, presence of viral mutation(s) within the areas targeted by this assay, and inadequate number of viral copies(<138 copies/mL). A negative result must be combined with clinical observations, patient history, and epidemiological information. The expected result is Negative.  Fact Sheet for Patients:  BloggerCourse.com  Fact Sheet for Healthcare Providers:   SeriousBroker.it  This test is no t yet approved or cleared by the [place] FDA and  has been authorized for detection and/or diagnosis of SARS-CoV-2 by FDA under an Emergency Use Authorization (EUA). This EUA will remain  in effect (meaning this test can be used) for the duration of the COVID-19 declaration under Section 564(b)(1) of the Act, 21 U.S.C.section 360bbb-3(b)(1), unless the authorization is terminated  or revoked sooner.       Influenza A by PCR NEGATIVE NEGATIVE Final   Influenza B by PCR NEGATIVE NEGATIVE Final    Comment: (NOTE) The Xpert Xpress SARS-CoV-2/FLU/RSV plus assay is intended as an aid in the diagnosis of influenza from Nasopharyngeal swab specimens and should not be used as a sole basis for treatment. Nasal washings and aspirates are unacceptable for Xpert Xpress SARS-CoV-2/FLU/RSV testing.  Fact Sheet for Patients: BloggerCourse.com  Fact Sheet for Healthcare Providers: SeriousBroker.it  This test is not yet approved or cleared by the [place] FDA and has been authorized for detection and/or diagnosis of SARS-CoV-2 by FDA under an Emergency Use Authorization (EUA). This  EUA will remain in effect (meaning this test can be used) for the duration of the COVID-19 declaration under Section 564(b)(1) of the Act, 21 U.S.C. section 360bbb-3(b)(1), unless the authorization is terminated or revoked.  Performed at [facility], [address], [place], [place] [address]   Blood Culture (routine x 2)     Status: None (Preliminary result)   Collection Time: 01/23/21  6:35 PM   Specimen: BLOOD RIGHT FOREARM  Result Value Ref Range Status   Specimen Description BLOOD RIGHT FOREARM  Final   Special Requests   Final    BOTTLES DRAWN AEROBIC AND ANAEROBIC Blood Culture adequate volume   Culture   Final    NO GROWTH < 12 HOURS Performed at [name], [address],  [place], [place] [address]    Report Status PENDING  Incomplete  Blood Culture (routine x 2)     Status: None (Preliminary result)   Collection Time: 01/23/21  6:35 PM   Specimen: BLOOD  Result Value Ref Range Status   Specimen Description BLOOD LEFT ANTECUBITAL  Final   Special Requests   Final    BOTTLES DRAWN AEROBIC AND ANAEROBIC Blood Culture adequate volume   Culture   Final    NO GROWTH < 12 HOURS Performed at [facility], [address] [place], [place] [address]    Report Status PENDING  Incomplete     Radiology Studies: DG Chest Port 1 View  Result Date: 01/23/2021 CLINICAL DATA:  Questionable sepsis, fever, negative COVID. EXAM: PORTABLE CHEST 1 VIEW COMPARISON:  01/02/2021 FINDINGS: Shallow inspiration. Heart size and pulmonary vascularity are normal for technique. No airspace disease or consolidation in the lungs. No pleural effusions. No pneumothorax. Mediastinal contours appear intact. Thoracic scoliosis convex towards the right. IMPRESSION: No active disease. Electronically Signed   By: Burman Nieves M.D.   On: 01/23/2021 19:05    Scheduled Meds: . Chlorhexidine Gluconate Cloth  6 each Topical Daily  . cholecalciferol  2,000 Units Oral Daily  . enoxaparin (LOVENOX) injection  40 mg Subcutaneous Q24H  . hyoscyamine  0.375 mg Oral Q12H  . insulin aspart  0-9 Units Subcutaneous TID WC  . levETIRAcetam  250 mg Oral BID  . linaclotide  145 mcg Oral QAC breakfast  . lubiprostone  8 mcg Oral BID WC  . oxybutynin  10 mg Oral Daily  . pantoprazole  40 mg Oral QHS  . pravastatin  20 mg Oral q morning  . pregabalin  75 mg Oral Daily   And  . pregabalin  150 mg Oral QHS  . senna  2 tablet Oral BID   Continuous Infusions: . cefTRIAXone (ROCEPHIN)  IV    . lactated ringers 100 mL/hr at 01/24/21 0724  . vancomycin 1,000 mg (01/24/21 0551)     LOS: 1 day   Time spent: 35 mins   Coron Rossano Laural Benes, MD How to contact the [facility] Attending or Consulting provider 7A - 7P or  covering provider during after hours 7P -7A, for this patient?  1. Check the care team in [facility] and look for a) attending/consulting [facility] provider listed and b) the [facility] team listed 2. Log into [email or web address] and use [facility] universal password to access. If you do not have the password, please contact the hospital operator. 3. Locate the [facility] provider you are looking for under [facility] and page to a number that you can be directly reached. 4. If you still have difficulty reaching the provider, please page the [facility] (  Director on Call) for the Hospitalists listed on amion for assistance.  01/24/2021, 12:09 PM

## 2021-01-24 NOTE — Evaluation (Addendum)
Occupational Therapy Evaluation Patient Details Name: Vincent Anthony MRN: [id number] DOB: [birth year] Today's Date: 01/24/2021    History of Present Illness Vincent Anthony  is a 50 y.o. male, medical history of UltraPulse sclerosis, COVID-pneumonia in February, diabetes mellitus, with recent hospitalization due to sepsis from UTI, retention with Foley catheter for last 64-month, he presents from facility due to fever, progressive weakness, patient to ports he was at his usual state of health as yesterday, he is bedbound at baseline, reported this morning was feeling weak, tired, and he had fever of 104 at the facility which brought him to ED, he denies any cough, shortness of breath, abdominal pain, nausea or vomiting, he has chronic indwelling Foley.  - in ED patient had fever 100.9, white blood cell elevated at 18 K, chest x-ray with no acute findings, UA was suggestive of UTI, blood cultures, urine cultures were sent, [facility] hospitalist consulted to admit.   Clinical Impression   Pt agreeable to OT/PT co-evaluation this date. Pt demonstrates limited functional use of R UE due to limited A/ROM. Pt required mod to max A for supine to sit and sit to supine bed mobility. Pt required Min A for scooting to L side at EOB. Pt able to complete lateral, posterior, and anterior leans with min guard assist. Total assist needed to don socks at bed level. Pt will benefit from continued OT in the hospital setting and recommended venue below to increase strength, balance, ROM, and endurance for safe ADL's.     Follow Up Recommendations  SNF    Equipment Recommendations  None recommended by OT           Precautions / Restrictions Precautions Precautions: Fall Restrictions Weight Bearing Restrictions: No      Mobility Bed Mobility Overal bed mobility: Needs Assistance Bed Mobility: Supine to Sit;Sit to Supine (Pt able to complete scooting to L side using L UE primaril with B LE to lift and scoot.  Min A required for this.)     Supine to sit: Mod assist;Max assist Sit to supine: Mod assist;Max assist   General bed mobility comments: slow labored movement with assist needed to move B LE    Transfers                      Balance Overall balance assessment: Needs assistance Sitting-balance support: Feet supported Sitting balance-Leahy Scale: Fair Sitting balance - Comments: At EOB. Pt able to complete medial to latera, anterior to posterior, and circular postural stability with Min A progressing to Min G. Noted difficulty coordinating circular torso movements. Tendency to lean to R side. Postural control: Right lateral lean                                 ADL either performed or assessed with clinical judgement   ADL Overall ADL's : Needs assistance/impaired                     Lower Body Dressing: Total assistance;Bed level Lower Body Dressing Details (indicate cue type and reason): total assist to don socks at bed level                                         Pertinent Vitals/Pain Pain Assessment: Faces Pain Score: 0-No pain  Hand Dominance Right   Extremity/Trunk Assessment Upper Extremity Assessment Upper Extremity Assessment: RUE deficits/detail;LUE deficits/detail RUE Deficits / Details: Grossly functional shoulder ROM to 90 degrees. P/ROM ith ~20* limitation in shoulder flexion and abduction. Able to tolerate full P/ROM motion of wrsit. Minimal active ROM with lack of ability to functionally use hand. RUE Coordination: decreased fine motor;decreased gross motor LUE Deficits / Details: Grossly functional ROM and good strength.   Lower Extremity Assessment Lower Extremity Assessment: Defer to PT evaluation   Cervical / Trunk Assessment Cervical / Trunk Assessment: Other exceptions (appeared normal seated at EOB)   Communication Communication Communication: No difficulties   Cognition Arousal/Alertness:  Awake/alert Behavior During Therapy: WFL for tasks assessed/performed Overall Cognitive Status: Within Functional Limits for tasks assessed                                                      Home Living Family/patient expects to be discharged to:: Skilled nursing facility                                 Additional Comments: Pt is unsure of desired long term placement but sees SNF placement as a short term option.      Prior Functioning/Environment Level of Independence: Needs assistance  Gait / Transfers Assistance Needed: hoyer transfers, slideboard transfers at some point but unsure, working on [facility] propulsion at [employer] ADL's / [employer] Assistance Needed: pt assisted by facility for all ADLs.            OT Problem List: Decreased strength;Decreased range of motion;Decreased activity tolerance;Impaired balance (sitting and/or standing);Decreased coordination;Decreased cognition;Decreased knowledge of use of DME or AE;Impaired UE functional use;Obesity      OT Treatment/Interventions: Self-care/ADL training;Therapeutic exercise;DME and/or AE instruction;Therapeutic activities;Splinting;Patient/family education;Balance training    OT Goals(Current goals can be found in the care plan section) Acute Rehab OT Goals Patient Stated Goal: return to SNF for rehab OT Goal Formulation: With patient Time For Goal Achievement: 02/07/21 Potential to Achieve Goals: Fair  OT Frequency: Min 2X/week               Co-evaluation PT/OT/SLP Co-Evaluation/Treatment: Yes Reason for Co-Treatment: Complexity of the patient's impairments (multi-system involvement)   OT goals addressed during session: ADL's and self-care;Strengthening/ROM                       End of Session    Activity Tolerance: Patient tolerated treatment well Patient left: in bed;with call bell/phone within reach  OT Visit Diagnosis: Other abnormalities of gait and mobility  (R26.89);Muscle weakness (generalized) (M62.81);Other symptoms and signs involving the nervous system (Y77.412)                Time: 8786-7672 OT Time Calculation (min): 19 min Charges:  OT General Charges $OT Visit: 1 Visit OT Evaluation $OT Eval Moderate Complexity: 1 Mod  Vincent Anthony OT, MOT   Vincent Anthony 01/24/2021, 12:45 PM

## 2021-01-25 LAB — CBC WITH DIFFERENTIAL/PLATELET
Abs Immature Granulocytes: 0.13 10*3/uL — ABNORMAL HIGH (ref 0.00–0.07)
Basophils Absolute: 0.1 10*3/uL (ref 0.0–0.1)
Basophils Relative: 0 %
Eosinophils Absolute: 0.2 10*3/uL (ref 0.0–0.5)
Eosinophils Relative: 1 %
HCT: 28.9 % — ABNORMAL LOW (ref 39.0–52.0)
Hemoglobin: 9.1 g/dL — ABNORMAL LOW (ref 13.0–17.0)
Immature Granulocytes: 1 %
Lymphocytes Relative: 5 %
Lymphs Abs: 0.8 10*3/uL (ref 0.7–4.0)
MCH: 27.8 pg (ref 26.0–34.0)
MCHC: 31.5 g/dL (ref 30.0–36.0)
MCV: 88.4 fL (ref 80.0–100.0)
Monocytes Absolute: 1.6 10*3/uL — ABNORMAL HIGH (ref 0.1–1.0)
Monocytes Relative: 10 %
Neutro Abs: 12.9 10*3/uL — ABNORMAL HIGH (ref 1.7–7.7)
Neutrophils Relative %: 83 %
Platelets: 314 10*3/uL (ref 150–400)
RBC: 3.27 MIL/uL — ABNORMAL LOW (ref 4.22–5.81)
RDW: 17.7 % — ABNORMAL HIGH (ref 11.5–15.5)
WBC: 15.7 10*3/uL — ABNORMAL HIGH (ref 4.0–10.5)
nRBC: 0 % (ref 0.0–0.2)

## 2021-01-25 LAB — BASIC METABOLIC PANEL
Anion gap: 8 (ref 5–15)
BUN: 7 mg/dL (ref 6–20)
CO2: 26 mmol/L (ref 22–32)
Calcium: 8.5 mg/dL — ABNORMAL LOW (ref 8.9–10.3)
Chloride: 104 mmol/L (ref 98–111)
Creatinine, Ser: 0.93 mg/dL (ref 0.61–1.24)
GFR, Estimated: >60 mL/min (ref >60)
Glucose, Bld: 110 mg/dL — ABNORMAL HIGH (ref 70–99)
Potassium: 3.6 mmol/L (ref 3.5–5.1)
Sodium: 138 mmol/L (ref 135–145)

## 2021-01-25 LAB — MAGNESIUM: Magnesium: 1.4 mg/dL — ABNORMAL LOW (ref 1.7–2.4)

## 2021-01-25 MED ORDER — MAGNESIUM SULFATE 4 GM/100ML IV SOLN
4.0000 g | Freq: Once | INTRAVENOUS | Status: AC
  Administered 2021-01-25: 4 g via INTRAVENOUS
  Filled 2021-01-25: qty 100

## 2021-01-25 MED ORDER — PIPERACILLIN-TAZOBACTAM 3.375 G IVPB
3.3750 g | Freq: Three times a day (TID) | INTRAVENOUS | Status: DC
  Administered 2021-01-25: 3.375 g via INTRAVENOUS
  Filled 2021-01-25 (×2): qty 50

## 2021-01-25 MED ORDER — SODIUM CHLORIDE 0.9 % IV BOLUS
500.0000 mL | Freq: Once | INTRAVENOUS | Status: AC
  Administered 2021-01-25: 500 mL via INTRAVENOUS

## 2021-01-25 MED ORDER — IBUPROFEN 600 MG PO TABS
600.0000 mg | ORAL_TABLET | Freq: Once | ORAL | Status: AC
  Administered 2021-01-25: 600 mg via ORAL
  Filled 2021-01-25: qty 1

## 2021-01-25 NOTE — Progress Notes (Addendum)
MD Laural Benes secure messaged at this time with day shift RN Carollee Herter attached:  Good morning, this patients temperature has been creeping overnight and was 99.8 and is now 99.9 with a increase in his WBC count from 14.9 to 15.7 today, wanted you to know for assessment this morning.

## 2021-01-25 NOTE — Progress Notes (Addendum)
   01/25/21 2027  Assess: MEWS Score  Temp (!) 102.1 F (38.9 C)  BP 114/70  Pulse Rate (!) 108  Resp 19  Level of Consciousness Alert  SpO2 92 %  O2 Device Room Air  Patient Activity (if Appropriate) In bed  Assess: MEWS Score  MEWS Temp 2  MEWS Systolic 0  MEWS Pulse 1  MEWS RR 0  MEWS LOC 0  MEWS Score 3  MEWS Score Color Yellow  Assess: if the MEWS score is Yellow or Red  Were vital signs taken at a resting state? Yes  Focused Assessment No change from prior assessment  Early Detection of Sepsis Score *See Row Information* High  MEWS guidelines implemented *See Row Information* Yes  Treat  MEWS Interventions Administered prn meds/treatments  Pain Scale 0-10  Pain Score 0  Take Vital Signs  Increase Vital Sign Frequency  Yellow: Q 2hr X 2 then Q 4hr X 2, if remains yellow, continue Q 4hrs  Escalate  MEWS: Escalate Yellow: discuss with charge nurse/RN and consider discussing with provider and RRT  Notify: Charge Nurse/RN  Name of Charge Nurse/RN Notified Bree  Date Charge Nurse/RN Notified 01/25/21  Time Charge Nurse/RN Notified 2045  Notify: Provider  Provider Name/Title Shary Decamp  Date Provider Notified 01/25/21  Time Provider Notified 2046  Notification Type Page  Notification Reason Change in status  Provider response See new orders  Date of Provider Response 01/25/21  Time of Provider Response 2045  Document  Patient Outcome Stabilized after interventions  Progress note created (see row info) Yes  Writer alerted by CNA that patient had a temperature. Writer went to assess patient and patient denies fever symptoms. MD [place] Harmon Pier made aware via secure chat. MD ordered: NS bolus and blood culture x 2. Charge nurse Bree notified via face to face and was told "OK" when this writer attempted to discuss this patients status with her.  MEWS yellow implemented at this time. Pt is AxOx4. [place] intact at this time. yellow urine with sediement collected  from foley at this time. Will continue to monitor closely.   0400: pt urine is clear at this time with no sediment. VSS have been stable after intervention of zosyn, ibuprofen ans bolus ordered. Pt states he feels better at this time. Pt is cool to the touch not radiating hot as he was beginning of shift. Pt drank water at this time. Will continue to monitor.

## 2021-01-25 NOTE — Plan of Care (Signed)
  Problem: Education: Goal: Knowledge of General Education information will improve Description Including pain rating scale, medication(s)/side effects and non-pharmacologic comfort measures Outcome: Progressing   Problem: Health Behavior/Discharge Planning: Goal: Ability to manage health-related needs will improve Outcome: Progressing   

## 2021-01-25 NOTE — Progress Notes (Signed)
PROGRESS NOTE   Vincent Anthony  XHB:[id number] DOB: 08/29/1971 DOA: 01/23/2021 PCP: [name], MD   Chief Complaint  Patient presents with  . Fever   Level of care: Med-Surg  Brief Admission History:  50 y.o. male, medical history of UltraPulse sclerosis, COVID-pneumonia in February, diabetes mellitus, with recent hospitalization due to sepsis from UTI, retention with Foley catheter for last 57-month, he presents from facility due to fever, progressive weakness, patient to ports he was at his usual state of health as yesterday, he is bedbound at baseline, reported this morning was feeling weak, tired, and he had fever of 104 at the facility which brought him to ED, he denies any cough, shortness of breath, abdominal pain, nausea or vomiting, he has chronic indwelling Foley.  In ED patient had fever 100.9, white blood cell elevated at 18 K, chest x-ray with no acute findings, UA was suggestive of UTI, blood cultures, urine cultures were sent, [facility] hospitalist consulted to admit.  Assessment & Plan:   Active Problems:   MS (multiple sclerosis) (HCC)   Absent testis, acquired   Chronic pain syndrome   Neurogenic bladder   Immunosuppressed status (HCC)   UTI (urinary tract infection)   Catheter associated UTI - indwelling foley in place, recent history of enterococcal UTI agree with continuing vancomycin pending C&S findings.  Continue supportive measures. Pt reports that he is starting to feel better.  Urology recommended trial of removing catheter to see if patient could urinate without catheter in place, would like to try that on 5/1.    Leukocytosis - Follow CBC with treatment.    MS associated neurogenic bladder - Foley was placed 2 months back at SNF due to incontinence, Urologist Dr Mena Goes was contacted during admission and recommended trial of removing foley in a couple of days and doing a void trial.    MS with chronic pain - He has been on lyrica and baclofen which is  continued.   Hyperlipidemia - resumed home statin therapy.   Constipation - he has been resumed on home bowel regimen.   GAD - remains on home alprazolam.   DVT prophylaxis: enoxaparin  Code Status: full  Family Communication:  Disposition: return SNF when medically stabilized Status is: Inpatient  Remains inpatient appropriate because:IV treatments appropriate due to intensity of illness or inability to take PO and Inpatient level of care appropriate due to severity of illness  Dispo:  Patient From: Skilled Nursing Facility  Planned Disposition: Skilled Nursing Facility  Medically stable for discharge: No      Consultants:     Procedures:     Antimicrobials:  Vancomycin 4/28>> ceftrixaone 4/28>>   Subjective: No specific complaints.   Objective: Vitals:   01/24/21 1935 01/24/21 2128 01/25/21 0610 01/25/21 1422  BP:  112/64 107/70 118/67  Pulse:  97 92 92  Resp:  19 17   Temp: 99.8 F (37.7 C) 99.8 F (37.7 C) 99.9 F (37.7 C) 100 F (37.8 C)  TempSrc: Oral Oral Oral Oral  SpO2:  91% 92% 98%  Weight:      Height:        Intake/Output Summary (Last 24 hours) at 01/25/2021 1638 Last data filed at 01/25/2021 1606 Gross per 24 hour  Intake 619.53 ml  Output 4600 ml  Net -3980.47 ml   Filed Weights   01/23/21 1809  Weight: 92.1 kg    Examination:  General exam: chronically ill appearing male, Appears calm and comfortable  Respiratory system: Clear to  auscultation. Respiratory effort normal. Cardiovascular system: normal S1 & S2 heard. No JVD, murmurs, rubs, gallops or clicks. No pedal edema. Gastrointestinal system: Abdomen is nondistended, soft and nontender. No organomegaly or masses felt. Normal bowel sounds heard. GU: foley cath in place. Amber urine seen.  Central nervous system: Alert and oriented x3. Extremities: Symmetric 5 x 5 power. Bilateral heel protectors.  Skin: No rashes, lesions or ulcers Psychiatry: Judgement and insight appear  normal. Mood & affect appropriate.   Data Reviewed: I have personally reviewed following labs and imaging studies  CBC: Recent Labs  Lab 01/23/21 1835 01/24/21 0503 01/25/21 0513  WBC 18.1* 14.9* 15.7*  NEUTROABS 15.5*  --  12.9*  HGB 11.0* 9.7* 9.1*  HCT 34.9* 31.7* 28.9*  MCV 89.0 91.1 88.4  PLT 308 251 314    Basic Metabolic Panel: Recent Labs  Lab 01/23/21 1835 01/24/21 0503 01/25/21 0513  NA 137 141 138  K 3.8 3.9 3.6  CL 99 107 104  CO2 28 26 26   GLUCOSE 122* 113* 110*  BUN 13 10 7   CREATININE 1.10 1.03 0.93  CALCIUM 8.8* 8.4* 8.5*  MG  --   --  1.4*    GFR: Estimated Creatinine Clearance: 113.6 mL/min (by C-G formula based on SCr of 0.93 mg/dL).  Liver Function Tests: Recent Labs  Lab 01/23/21 1835  AST 16  ALT 16  ALKPHOS 94  BILITOT 0.8  PROT 6.6  ALBUMIN 3.3*    CBG: Recent Labs  Lab 01/23/21 2216 01/24/21 0753 01/24/21 1119  GLUCAP 101* 93 97    Recent Results (from the past 240 hour(s))  Resp Panel by RT-PCR (Flu A&B, Covid) Nasopharyngeal Swab     Status: None   Collection Time: 01/23/21  6:22 PM   Specimen: Nasopharyngeal Swab; Nasopharyngeal(NP) swabs in vial transport medium  Result Value Ref Range Status   SARS Coronavirus 2 by RT PCR NEGATIVE NEGATIVE Final    Comment: (NOTE) SARS-CoV-2 target nucleic acids are NOT DETECTED.  The SARS-CoV-2 RNA is generally detectable in upper respiratory specimens during the acute phase of infection. The lowest concentration of SARS-CoV-2 viral copies this assay can detect is 138 copies/mL. A negative result does not preclude SARS-Cov-2 infection and should not be used as the sole basis for treatment or other patient management decisions. A negative result may occur with  improper specimen collection/handling, submission of specimen other than nasopharyngeal swab, presence of viral mutation(s) within the areas targeted by this assay, and inadequate number of viral copies(<138 copies/mL).  A negative result must be combined with clinical observations, patient history, and epidemiological information. The expected result is Negative.  Fact Sheet for Patients:  01/26/21  Fact Sheet for Healthcare Providers:  01/25/21  This test is no t yet approved or cleared by the [employer] FDA and  has been authorized for detection and/or diagnosis of SARS-CoV-2 by FDA under an Emergency Use Authorization (EUA). This EUA will remain  in effect (meaning this test can be used) for the duration of the COVID-19 declaration under Section 564(b)(1) of the Act, 21 U.S.C.section 360bbb-3(b)(1), unless the authorization is terminated  or revoked sooner.       Influenza A by PCR NEGATIVE NEGATIVE Final   Influenza B by PCR NEGATIVE NEGATIVE Final    Comment: (NOTE) The Xpert Xpress SARS-CoV-2/FLU/RSV plus assay is intended as an aid in the diagnosis of influenza from Nasopharyngeal swab specimens and should not be used as a sole basis for treatment. Nasal washings and  aspirates are unacceptable for Xpert Xpress SARS-CoV-2/FLU/RSV testing.  Fact Sheet for Patients: [employer]com  Fact Sheet for Healthcare Providers: Serious[employer]ker.it  This test is not yet approved or cleared by the [place] FDA and has been authorized for detection and/or diagnosis of SARS-CoV-2 by FDA under an Emergency Use Authorization (EUA). This EUA will remain in effect (meaning this test can be used) for the duration of the COVID-19 declaration under Section 564(b)(1) of the Act, 21 U.S.C. section 360bbb-3(b)(1), unless the authorization is terminated or revoked.  Performed at [facility], [address], [place], [place] [address]   Urine culture     Status: Abnormal (Preliminary result)   Collection Time: 01/23/21  6:25 PM   Specimen: In/Out Cath Urine  Result Value Ref Range  Status   Specimen Description   Final    IN/OUT CATH URINE Performed at [facility], [address] [place], [place] [address]    Special Requests   Final    NONE Performed at [facility], [address] [place], [place] [address]    Culture (A)  Final    >=100,000 COLONIES/mL PSEUDOMONAS AERUGINOSA 40,000 COLONIES/mL KLEBSIELLA PNEUMONIAE SUSCEPTIBILITIES TO FOLLOW Performed at [facility], [address], [place], [place] [address]    Report Status PENDING  Incomplete  Blood Culture (routine x 2)     Status: None (Preliminary result)   Collection Time: 01/23/21  6:35 PM   Specimen: BLOOD RIGHT FOREARM  Result Value Ref Range Status   Specimen Description BLOOD RIGHT FOREARM  Final   Special Requests   Final    BOTTLES DRAWN AEROBIC AND ANAEROBIC Blood Culture adequate volume   Culture   Final    NO GROWTH 2 DAYS Performed at [facility], [address], [place], [place] [address]    Report Status PENDING  Incomplete  Blood Culture (routine x 2)     Status: None (Preliminary result)   Collection Time: 01/23/21  6:35 PM   Specimen: BLOOD  Result Value Ref Range Status   Specimen Description BLOOD LEFT ANTECUBITAL  Final   Special Requests   Final    BOTTLES DRAWN AEROBIC AND ANAEROBIC Blood Culture adequate volume   Culture   Final    NO GROWTH 2 DAYS Performed at [facility], [address], [place], [place] [address]    Report Status PENDING  Incomplete     Radiology Studies: DG Chest Port 1 View  Result Date: 01/23/2021 CLINICAL DATA:  Questionable sepsis, fever, negative COVID. EXAM: PORTABLE CHEST 1 VIEW COMPARISON:  01/02/2021 FINDINGS: Shallow inspiration. Heart size and pulmonary vascularity are normal for technique. No airspace disease or consolidation in the lungs. No pleural effusions. No pneumothorax. Mediastinal contours appear intact. Thoracic scoliosis convex towards the right. IMPRESSION: No active disease. Electronically Signed   By: Burman Nieves M.D.   On: 01/23/2021 19:05    Scheduled Meds: . Chlorhexidine Gluconate Cloth  6 each Topical Daily  . cholecalciferol  2,000 Units Oral Daily  . enoxaparin (LOVENOX) injection  40 mg Subcutaneous Q24H  . hyoscyamine  0.375 mg Oral Q12H  . levETIRAcetam  250 mg Oral BID  . linaclotide  145 mcg Oral QAC breakfast  . lubiprostone  8 mcg Oral BID WC  . oxybutynin  10 mg Oral Daily  . pantoprazole  40 mg Oral QHS  . pravastatin  20 mg Oral q morning  . pregabalin  75 mg Oral Daily   And  . pregabalin  150 mg Oral QHS  . senna  2 tablet Oral BID   Continuous Infusions: . cefTRIAXone (ROCEPHIN)  IV Stopped (01/24/21 2008)  . lactated ringers 70 mL/hr at 01/24/21 1310  . vancomycin 10 mL/hr at 01/25/21 1606     LOS: 2 days   Time spent: 35 mins   Bertel Venard Laural BenesJohnson, MD How to contact the [facility] Attending or Consulting provider 7A - 7P or covering provider during after hours 7P -7A, for this patient?  1. Check the care team in [facility] and look for a) attending/consulting [facility] provider listed and b) the [facility] team listed 2. Log into [email or web address] and use [place]'s universal password to access. If you do not have the password, please contact the hospital operator. 3. Locate the [facility] provider you are looking for under [facility] and page to a number that you can be directly reached. 4. If you still have difficulty reaching the provider, please page the [facility] (Director on Call) for the Hospitalists listed on amion for assistance.  01/25/2021, 4:38 PM

## 2021-01-26 LAB — COMPREHENSIVE METABOLIC PANEL
ALT: 21 U/L (ref 0–44)
AST: 20 U/L (ref 15–41)
Albumin: 2.5 g/dL — ABNORMAL LOW (ref 3.5–5.0)
Alkaline Phosphatase: 79 U/L (ref 38–126)
Anion gap: 7 (ref 5–15)
BUN: 10 mg/dL (ref 6–20)
CO2: 26 mmol/L (ref 22–32)
Calcium: 8 mg/dL — ABNORMAL LOW (ref 8.9–10.3)
Chloride: 106 mmol/L (ref 98–111)
Creatinine, Ser: 1 mg/dL (ref 0.61–1.24)
GFR, Estimated: >60 mL/min (ref >60)
Glucose, Bld: 134 mg/dL — ABNORMAL HIGH (ref 70–99)
Potassium: 3.6 mmol/L (ref 3.5–5.1)
Sodium: 139 mmol/L (ref 135–145)
Total Bilirubin: 0.7 mg/dL (ref 0.3–1.2)
Total Protein: 5.4 g/dL — ABNORMAL LOW (ref 6.5–8.1)

## 2021-01-26 LAB — CBC
HCT: 28.8 % — ABNORMAL LOW (ref 39.0–52.0)
Hemoglobin: 9 g/dL — ABNORMAL LOW (ref 13.0–17.0)
MCH: 27.5 pg (ref 26.0–34.0)
MCHC: 31.3 g/dL (ref 30.0–36.0)
MCV: 88.1 fL (ref 80.0–100.0)
Platelets: 360 10*3/uL (ref 150–400)
RBC: 3.27 MIL/uL — ABNORMAL LOW (ref 4.22–5.81)
RDW: 17.2 % — ABNORMAL HIGH (ref 11.5–15.5)
WBC: 13.6 10*3/uL — ABNORMAL HIGH (ref 4.0–10.5)
nRBC: 0 % (ref 0.0–0.2)

## 2021-01-26 LAB — URINE CULTURE: Culture: >=100000 — AB

## 2021-01-26 LAB — MAGNESIUM: Magnesium: 2 mg/dL (ref 1.7–2.4)

## 2021-01-26 MED ORDER — AMITRIPTYLINE HCL 10 MG PO TABS
10.0000 mg | ORAL_TABLET | Freq: Every day | ORAL | Status: DC
  Administered 2021-01-26 – 2021-01-29 (×4): 10 mg via ORAL
  Filled 2021-01-26 (×4): qty 1

## 2021-01-26 MED ORDER — SACCHAROMYCES BOULARDII 250 MG PO CAPS
250.0000 mg | ORAL_CAPSULE | Freq: Two times a day (BID) | ORAL | Status: DC
  Administered 2021-01-26 – 2021-01-30 (×9): 250 mg via ORAL
  Filled 2021-01-26 (×9): qty 1

## 2021-01-26 MED ORDER — BACLOFEN 10 MG PO TABS
10.0000 mg | ORAL_TABLET | Freq: Three times a day (TID) | ORAL | Status: DC
  Administered 2021-01-26 – 2021-01-30 (×12): 10 mg via ORAL
  Filled 2021-01-26 (×13): qty 1

## 2021-01-26 MED ORDER — CLONAZEPAM 0.25 MG PO TBDP
0.2500 mg | ORAL_TABLET | Freq: Two times a day (BID) | ORAL | Status: DC | PRN
  Administered 2021-01-26 – 2021-01-27 (×2): 0.25 mg via ORAL
  Filled 2021-01-26 (×2): qty 1

## 2021-01-26 MED ORDER — SODIUM CHLORIDE 0.9 % IV SOLN
2.0000 g | Freq: Three times a day (TID) | INTRAVENOUS | Status: DC
  Administered 2021-01-26 – 2021-01-30 (×12): 2 g via INTRAVENOUS
  Filled 2021-01-26 (×12): qty 2

## 2021-01-26 NOTE — Plan of Care (Signed)
  Problem: Fluid Volume: Goal: Hemodynamic stability will improve Outcome: Progressing   Problem: Clinical Measurements: Goal: Diagnostic test results will improve Outcome: Progressing   

## 2021-01-26 NOTE — Progress Notes (Signed)
PROGRESS NOTE   MY SIDEL  MVE:[id number] DOB: [birth year] DOA: 01/23/2021 PCP: [name], MD   Chief Complaint  Patient presents with  . Fever   Level of care: Med-Surg  Brief Admission History:  50 y.o. male, medical history of UltraPulse sclerosis, COVID-pneumonia in February, diabetes mellitus, with recent hospitalization due to sepsis from UTI, retention with Foley catheter for last 11-month, he presents from facility due to fever, progressive weakness, patient to ports he was at his usual state of health as yesterday, he is bedbound at baseline, reported this morning was feeling weak, tired, and he had fever of 104 at the facility which brought him to ED, he denies any cough, shortness of breath, abdominal pain, nausea or vomiting, he has chronic indwelling Foley.  In ED patient had fever 100.9, white blood cell elevated at 18 K, chest x-ray with no acute findings, UA was suggestive of UTI, blood cultures, urine cultures were sent, [facility] hospitalist consulted to admit.  Assessment & Plan:   Active Problems:   MS (multiple sclerosis) (HCC)   Absent testis, acquired   Chronic pain syndrome   Neurogenic bladder   Immunosuppressed status (HCC)   UTI (urinary tract infection)   Pseudomonas and Klebsiella Catheter associated UTI - indwelling foley in place. With new culture results with sensitivities I have started him on cefepime.  DC vanc and ceftriaxone.  Continue supportive measures. Pt reports that he is starting to feel better.  Urology recommended trial of removing catheter to see if patient could urinate without catheter in place, would like to try that on 5/2.    Leukocytosis - Follow CBC with treatment.  WBC coming down.      MS associated neurogenic bladder - Foley was placed 2 months back at SNF due to incontinence, Urologist Dr Mena Goes was contacted during admission and recommended trial of removing foley in a couple of days and doing a void trial.    MS with  chronic pain - He has been on lyrica and baclofen which is continued.   Hyperlipidemia - resumed home statin therapy.   Constipation - he has been resumed on home bowel regimen.   GAD - remains on home alprazolam.   DVT prophylaxis: enoxaparin  Code Status: full  Family Communication: wife updated by telephone 5/1  Disposition: return SNF when medically stabilized Status is: Inpatient  Remains inpatient appropriate because:IV treatments appropriate due to intensity of illness or inability to take PO and Inpatient level of care appropriate due to severity of illness  Dispo:  Patient From: Skilled Nursing Facility  Planned Disposition: Skilled Nursing Facility  Medically stable for discharge: No     Consultants:     Procedures:     Antimicrobials:  Vancomycin 4/28>>5/1 ceftrixaone 4/28>>5/1 Cefepime 5/1>>  Subjective: Pt had fever overnight but reports feeling better this morning.   Objective: Vitals:   01/26/21 0022 01/26/21 0400 01/26/21 0800 01/26/21 0905  BP: 105/64 101/68 108/67 109/65  Pulse: (!) 101 88 95 84  Resp: 20 20 18 18   Temp: 99.7 F (37.6 C) 98.9 F (37.2 C) 98.2 F (36.8 C) 98.1 F (36.7 C)  TempSrc: Oral Oral Oral Oral  SpO2: 96% 95% 96% 97%  Weight:      Height:        Intake/Output Summary (Last 24 hours) at 01/26/2021 1227 Last data filed at 01/26/2021 1000 Gross per 24 hour  Intake 3127.69 ml  Output 3950 ml  Net -822.31 ml   [employer]  01/23/21 1809  Weight: 92.1 kg    Examination:  General exam: chronically ill appearing male, Appears calm and comfortable  Respiratory system: Clear to auscultation. Respiratory effort normal. Cardiovascular system: normal S1 & S2 heard. No JVD, murmurs, rubs, gallops or clicks. No pedal edema. Gastrointestinal system: Abdomen is nondistended, soft and nontender. No organomegaly or masses felt. Normal bowel sounds heard. GU: foley cath in place. Amber urine seen.  Central nervous system:  Alert and oriented x3. Extremities: Symmetric 5 x 5 power. Bilateral heel protectors.  Skin: No rashes, lesions or ulcers Psychiatry: Judgement and insight appear normal. Mood & affect appropriate.   Data Reviewed: I have personally reviewed following labs and imaging studies  CBC: Recent Labs  Lab 01/23/21 1835 01/24/21 0503 01/25/21 0513 01/26/21 0529  WBC 18.1* 14.9* 15.7* 13.6*  NEUTROABS 15.5*  --  12.9*  --   HGB 11.0* 9.7* 9.1* 9.0*  HCT 34.9* 31.7* 28.9* 28.8*  MCV 89.0 91.1 88.4 88.1  PLT 308 251 314 360    Basic Metabolic Panel: Recent Labs  Lab 01/23/21 1835 01/24/21 0503 01/25/21 0513 01/26/21 0529  NA 137 141 138 139  K 3.8 3.9 3.6 3.6  CL 99 107 104 106  CO2 28 26 26 26   GLUCOSE 122* 113* 110* 134*  BUN 13 10 7 10   CREATININE 1.10 1.03 0.93 1.00  CALCIUM 8.8* 8.4* 8.5* 8.0*  MG  --   --  1.4* 2.0    GFR: Estimated Creatinine Clearance: 105.6 mL/min (by C-G formula based on SCr of 1 mg/dL).  Liver Function Tests: Recent Labs  Lab 01/23/21 1835 01/26/21 0529  AST 16 20  ALT 16 21  ALKPHOS 94 79  BILITOT 0.8 0.7  PROT 6.6 5.4*  ALBUMIN 3.3* 2.5*    CBG: Recent Labs  Lab 01/23/21 2216 01/24/21 0753 01/24/21 1119  GLUCAP 101* 93 97    Recent Results (from the past 240 hour(s))  Resp Panel by RT-PCR (Flu A&B, Covid) Nasopharyngeal Swab     Status: None   Collection Time: 01/23/21  6:22 PM   Specimen: Nasopharyngeal Swab; Nasopharyngeal(NP) swabs in vial transport medium  Result Value Ref Range Status   SARS Coronavirus 2 by RT PCR NEGATIVE NEGATIVE Final    Comment: (NOTE) SARS-CoV-2 target nucleic acids are NOT DETECTED.  The SARS-CoV-2 RNA is generally detectable in upper respiratory specimens during the acute phase of infection. The lowest concentration of SARS-CoV-2 viral copies this assay can detect is 138 copies/mL. A negative result does not preclude SARS-Cov-2 infection and should not be used as the sole basis for treatment  or other patient management decisions. A negative result may occur with  improper specimen collection/handling, submission of specimen other than nasopharyngeal swab, presence of viral mutation(s) within the areas targeted by this assay, and inadequate number of viral copies(<138 copies/mL). A negative result must be combined with clinical observations, patient history, and epidemiological information. The expected result is Negative.  Fact Sheet for Patients:  01/26/21  Fact Sheet for Healthcare Providers:  01/25/21  This test is no t yet approved or cleared by the [employer] FDA and  has been authorized for detection and/or diagnosis of SARS-CoV-2 by FDA under an Emergency Use Authorization (EUA). This EUA will remain  in effect (meaning this test can be used) for the duration of the COVID-19 declaration under Section 564(b)(1) of the Act, 21 U.S.C.section 360bbb-3(b)(1), unless the authorization is terminated  or revoked sooner.  Influenza A by PCR NEGATIVE NEGATIVE Final   Influenza B by PCR NEGATIVE NEGATIVE Final    Comment: (NOTE) The Xpert Xpress SARS-CoV-2/FLU/RSV plus assay is intended as an aid in the diagnosis of influenza from Nasopharyngeal swab specimens and should not be used as a sole basis for treatment. Nasal washings and aspirates are unacceptable for Xpert Xpress SARS-CoV-2/FLU/RSV testing.  Fact Sheet for Patients: [employer]  Fact Sheet for Healthcare Providers: [employer]it  This test is not yet approved or cleared by the [place] FDA and has been authorized for detection and/or diagnosis of SARS-CoV-2 by FDA under an Emergency Use Authorization (EUA). This EUA will remain in effect (meaning this test can be used) for the duration of the COVID-19 declaration under Section 564(b)(1) of the Act, 21 U.S.C. section  360bbb-3(b)(1), unless the authorization is terminated or revoked.  Performed at [facility], [address] [place], [place] [address]   Urine culture     Status: Abnormal   Collection Time: 01/23/21  6:25 PM   Specimen: In/Out Cath Urine  Result Value Ref Range Status   Specimen Description   Final    IN/OUT CATH URINE Performed at [facility], [address], [place], [place] [address]    Special Requests   Final    NONE Performed at [facility], [address], [place], [place] [address]    Culture (A)  Final    >=100,000 COLONIES/mL PSEUDOMONAS AERUGINOSA 40,000 COLONIES/mL KLEBSIELLA PNEUMONIAE Confirmed Extended Spectrum Beta-Lactamase Producer (ESBL).  In bloodstream infections from ESBL organisms, carbapenems are preferred over piperacillin/tazobactam. They are shown to have a lower risk of mortality.    Report Status 01/26/2021 FINAL  Final   Organism ID, Bacteria PSEUDOMONAS AERUGINOSA (A)  Final   Organism ID, Bacteria KLEBSIELLA PNEUMONIAE (A)  Final      Susceptibility   Klebsiella pneumoniae - MIC*    AMPICILLIN >=32 RESISTANT Resistant     CEFAZOLIN >=64 RESISTANT Resistant     CEFEPIME 2 SENSITIVE Sensitive     CEFTRIAXONE 32 RESISTANT Resistant     CIPROFLOXACIN 0.5 SENSITIVE Sensitive     GENTAMICIN <=1 SENSITIVE Sensitive     IMIPENEM <=0.25 SENSITIVE Sensitive     NITROFURANTOIN 128 RESISTANT Resistant     TRIMETH/SULFA >=320 RESISTANT Resistant     AMPICILLIN/SULBACTAM >=32 RESISTANT Resistant     PIP/TAZO >=128 RESISTANT Resistant     * 40,000 COLONIES/mL KLEBSIELLA PNEUMONIAE   Pseudomonas aeruginosa - MIC*    CEFTAZIDIME 4 SENSITIVE Sensitive     CIPROFLOXACIN <=0.25 SENSITIVE Sensitive     GENTAMICIN <=1 SENSITIVE Sensitive     IMIPENEM 2 SENSITIVE Sensitive     PIP/TAZO <=4 SENSITIVE Sensitive     CEFEPIME 2 SENSITIVE Sensitive     * >=100,000 COLONIES/mL PSEUDOMONAS AERUGINOSA  Blood Culture (routine x 2)     Status: None (Preliminary  result)   Collection Time: 01/23/21  6:35 PM   Specimen: BLOOD RIGHT FOREARM  Result Value Ref Range Status   Specimen Description BLOOD RIGHT FOREARM  Final   Special Requests   Final    BOTTLES DRAWN AEROBIC AND ANAEROBIC Blood Culture adequate volume   Culture   Final    NO GROWTH 3 DAYS Performed at [facility], [address], [place], [place] [address]    Report Status PENDING  Incomplete  Blood Culture (routine x 2)     Status: None (Preliminary result)   Collection Time: 01/23/21  6:35 PM   Specimen: BLOOD  Result Value Ref Range Status   Specimen Description BLOOD LEFT ANTECUBITAL  Final   Special Requests   Final    BOTTLES DRAWN AEROBIC AND ANAEROBIC Blood Culture adequate volume   Culture   Final    NO GROWTH 3 DAYS Performed at [facility], [address] [place], [place] [address]    Report Status PENDING  Incomplete  Culture, blood (routine x 2)     Status: None (Preliminary result)   Collection Time: 01/25/21  9:47 PM   Specimen: Left Antecubital; Blood  Result Value Ref Range Status   Specimen Description LEFT ANTECUBITAL  Final   Special Requests   Final    Blood Culture adequate volume BOTTLES DRAWN AEROBIC ONLY   Culture   Final    NO GROWTH < 12 HOURS Performed at [facility], [address], [place], [place] [address]    Report Status PENDING  Incomplete  Culture, blood (routine x 2)     Status: None (Preliminary result)   Collection Time: 01/25/21  9:54 PM   Specimen: BLOOD RIGHT HAND  Result Value Ref Range Status   Specimen Description BLOOD RIGHT HAND  Final   Special Requests   Final    Blood Culture adequate volume BOTTLES DRAWN AEROBIC ONLY   Culture   Final    NO GROWTH < 12 HOURS Performed at [facility], [address], [place], [place] [address]    Report Status PENDING  Incomplete     Radiology Studies: No results found.  Scheduled Meds: . Chlorhexidine Gluconate Cloth  6 each Topical Daily  . cholecalciferol  2,000 Units Oral  Daily  . enoxaparin (LOVENOX) injection  40 mg Subcutaneous Q24H  . hyoscyamine  0.375 mg Oral Q12H  . levETIRAcetam  250 mg Oral BID  . linaclotide  145 mcg Oral QAC breakfast  . lubiprostone  8 mcg Oral BID WC  . oxybutynin  10 mg Oral Daily  . pantoprazole  40 mg Oral QHS  . pravastatin  20 mg Oral q morning  . pregabalin  75 mg Oral Daily   And  . pregabalin  150 mg Oral QHS  . saccharomyces boulardii  250 mg Oral BID  . senna  2 tablet Oral BID   Continuous Infusions: . ceFEPime (MAXIPIME) IV 2 g (01/26/21 0852)  . lactated ringers 70 mL/hr at 01/26/21 0633     LOS: 3 days   Time spent: 35 mins   Renley Banwart Laural Benes, MD How to contact the [facility] Attending or Consulting provider 7A - 7P or covering provider during after hours 7P -7A, for this patient?  1. Check the care team in [facility] and look for a) attending/consulting [facility] provider listed and b) the [facility] team listed 2. Log into [email or web address] and use [place]'s universal password to access. If you do not have the password, please contact the hospital operator. 3. Locate the [facility] provider you are looking for under [facility] and page to a number that you can be directly reached. 4. If you still have difficulty reaching the provider, please page the [facility] (Director on Call) for the Hospitalists listed on amion for assistance.  01/26/2021, 12:27 PM

## 2021-01-27 LAB — MAGNESIUM: Magnesium: 1.9 mg/dL (ref 1.7–2.4)

## 2021-01-27 LAB — COMPREHENSIVE METABOLIC PANEL
ALT: 25 U/L (ref 0–44)
AST: 22 U/L (ref 15–41)
Albumin: 2.7 g/dL — ABNORMAL LOW (ref 3.5–5.0)
Alkaline Phosphatase: 83 U/L (ref 38–126)
Anion gap: 8 (ref 5–15)
BUN: 9 mg/dL (ref 6–20)
CO2: 24 mmol/L (ref 22–32)
Calcium: 8.5 mg/dL — ABNORMAL LOW (ref 8.9–10.3)
Chloride: 106 mmol/L (ref 98–111)
Creatinine, Ser: 0.9 mg/dL (ref 0.61–1.24)
GFR, Estimated: >60 mL/min (ref >60)
Glucose, Bld: 102 mg/dL — ABNORMAL HIGH (ref 70–99)
Potassium: 3.8 mmol/L (ref 3.5–5.1)
Sodium: 138 mmol/L (ref 135–145)
Total Bilirubin: 0.6 mg/dL (ref 0.3–1.2)
Total Protein: 5.9 g/dL — ABNORMAL LOW (ref 6.5–8.1)

## 2021-01-27 LAB — CBC
HCT: 30 % — ABNORMAL LOW (ref 39.0–52.0)
Hemoglobin: 9.5 g/dL — ABNORMAL LOW (ref 13.0–17.0)
MCH: 27.9 pg (ref 26.0–34.0)
MCHC: 31.7 g/dL (ref 30.0–36.0)
MCV: 88.2 fL (ref 80.0–100.0)
Platelets: 321 10*3/uL (ref 150–400)
RBC: 3.4 MIL/uL — ABNORMAL LOW (ref 4.22–5.81)
RDW: 17.5 % — ABNORMAL HIGH (ref 11.5–15.5)
WBC: 15.2 10*3/uL — ABNORMAL HIGH (ref 4.0–10.5)
nRBC: 0 % (ref 0.0–0.2)

## 2021-01-27 MED ORDER — SENNA 8.6 MG PO TABS: 2.0000 | ORAL_TABLET | Freq: Every day | ORAL | Status: DC | PRN

## 2021-01-27 NOTE — Progress Notes (Signed)
PROGRESS NOTE   Vincent Anthony  ZOX:[id number] DOB: [birth year] DOA: 01/23/2021 PCP: Smith RobertKikel, Stephen, MD   Chief Complaint  Patient presents with  . Fever   Level of care: Med-Surg  Brief Admission History:  50 y.o. male, medical history of UltraPulse sclerosis, COVID-pneumonia in February, diabetes mellitus, with recent hospitalization due to sepsis from UTI, retention with Foley catheter for last 6331-month, he presents from facility due to fever, progressive weakness, patient to ports he was at his usual state of health as yesterday, he is bedbound at baseline, reported this morning was feeling weak, tired, and he had fever of 104 at the facility which brought him to ED, he denies any cough, shortness of breath, abdominal pain, nausea or vomiting, he has chronic indwelling Foley.  In ED patient had fever 100.9, white blood cell elevated at 18 K, chest x-ray with no acute findings, UA was suggestive of UTI, blood cultures, urine cultures were sent, [facility] hospitalist consulted to admit.  Assessment & Plan:   Active Problems:   MS (multiple sclerosis) (HCC)   Absent testis, acquired   Chronic pain syndrome   Neurogenic bladder   Immunosuppressed status (HCC)   UTI (urinary tract infection)   Pseudomonas and Klebsiella Catheter associated UTI - indwelling foley in place. With new culture results with sensitivities I have started him on cefepime.  DC vanc and ceftriaxone.  Continue supportive measures. Pt reports that he is starting to feel better.  Urology recommended trial of removing catheter to see if patient could urinate without catheter in place, will DC foley 5/2 and do voiding trial.  If he can void do not plan to replace foley.      Leukocytosis - Follow CBC with treatment.  WBC coming down.      MS associated neurogenic bladder - Foley was placed 2 months back at SNF due to incontinence, Urologist Dr Mena GoesEskridge was contacted during admission and recommended trial of removing foley  MS with chronic pain - He has been on lyrica and baclofen which is continued.   Hyperlipidemia - resumed home statin therapy.   Constipation - he has been resumed on home bowel regimen.   GAD - remains on home alprazolam.   DVT prophylaxis: enoxaparin  Code Status: full  Family Communication: wife updated by telephone 5/1  Disposition: return SNF when medically stabilized Status is: Inpatient  Remains inpatient appropriate because:IV treatments appropriate due to intensity of illness or inability to take PO and Inpatient level of care appropriate due to severity of illness  Dispo:  Patient From: Skilled Nursing Facility  Planned Disposition: Skilled Nursing Facility  Medically stable for discharge: No     Consultants:     Procedures:     Antimicrobials:  Vancomycin 4/28>>5/1 ceftrixaone 4/28>>5/1 Cefepime 5/1>>  Subjective: Pt reports that he is very anxious about having foley removed today.    Objective: Vitals:   01/26/21 1250 01/26/21 2011 01/27/21 0400 01/27/21 0802  BP: 111/76 120/76 114/68 116/71  Pulse: 85 (!) 102 96 98  Resp: 18 19 20    Temp: 99.9 F (37.7 C) 99.2 F (37.3 C) 99.1 F (37.3 C)   TempSrc: Oral Oral    SpO2: 99% 99% 96%   Weight:      Height:        Intake/Output Summary (Last 24 hours) at 01/27/2021 1235 Last data filed at 01/27/2021 1100 Gross per 24 hour  Intake 2245.64 ml  Output 1801 ml  Net 444.64 ml   [employer]  01/23/21 1809  Weight: 92.1 kg    Examination:  General exam: chronically ill appearing male, Appears calm and comfortable  Respiratory system: Clear to auscultation. Respiratory effort normal. Cardiovascular system: normal S1 & S2 heard. No JVD, murmurs, rubs, gallops or clicks. No pedal edema. Gastrointestinal system: Abdomen is nondistended, soft and nontender. No organomegaly or masses felt. Normal bowel sounds heard. GU: foley cath in place. Amber urine seen.  (foley to be removed today), normal  appearing external genitalia.  Central nervous system: Alert and oriented x3. Extremities: Symmetric 5 x 5 power. Bilateral heel protectors.  Skin: No rashes, lesions or ulcers Psychiatry: Judgement and insight appear normal. Mood & affect appropriate.   Data Reviewed: I have personally reviewed following labs and imaging studies  CBC: Recent Labs  Lab 01/23/21 1835 01/24/21 0503 01/25/21 0513 01/26/21 0529 01/27/21 0635  WBC 18.1* 14.9* 15.7* 13.6* 15.2*  NEUTROABS 15.5*  --  12.9*  --   --   HGB 11.0* 9.7* 9.1* 9.0* 9.5*  HCT 34.9* 31.7* 28.9* 28.8* 30.0*  MCV 89.0 91.1 88.4 88.1 88.2  PLT 308 251 314 360 321    Basic Metabolic Panel: Recent Labs  Lab 01/23/21 1835 01/24/21 0503 01/25/21 0513 01/26/21 0529 01/27/21 0635  NA 137 141 138 139 138  K 3.8 3.9 3.6 3.6 3.8  CL 99 107 104 106 106  CO2 28 26 26 26 24   GLUCOSE 122* 113* 110* 134* 102*  BUN 13 10 7 10 9   CREATININE 1.10 1.03 0.93 1.00 0.90  CALCIUM 8.8* 8.4* 8.5* 8.0* 8.5*  MG  --   --  1.4* 2.0 1.9    GFR: Estimated Creatinine Clearance: 117.4 mL/min (by C-G formula based on SCr of 0.9 mg/dL).  Liver Function Tests: Recent Labs  Lab 01/23/21 1835 01/26/21 0529 01/27/21 0635  AST 16 20 22   ALT 16 21 25   ALKPHOS 94 79 83  BILITOT 0.8 0.7 0.6  PROT 6.6 5.4* 5.9*  ALBUMIN 3.3* 2.5* 2.7*    CBG: Recent Labs  Lab 01/23/21 2216 01/24/21 0753 01/24/21 1119  GLUCAP 101* 93 97    Recent Results (from the past 240 hour(s))  Resp Panel by RT-PCR (Flu A&B, Covid) Nasopharyngeal Swab     Status: None   Collection Time: 01/23/21  6:22 PM   Specimen: Nasopharyngeal Swab; Nasopharyngeal(NP) swabs in vial transport medium  Result Value Ref Range Status   SARS Coronavirus 2 by RT PCR NEGATIVE NEGATIVE Final    Comment: (NOTE) SARS-CoV-2 target nucleic acids are NOT DETECTED.  The SARS-CoV-2 RNA is generally detectable in upper respiratory specimens during the acute phase of infection. The  lowest concentration of SARS-CoV-2 viral copies this assay can detect is 138 copies/mL. A negative result does not preclude SARS-Cov-2 infection and should not be used as the sole basis for treatment or other patient management decisions. A negative result may occur with  improper specimen collection/handling, submission of specimen other than nasopharyngeal swab, presence of viral mutation(s) within the areas targeted by this assay, and inadequate number of viral copies(<138 copies/mL). A negative result must be combined with clinical observations, patient history, and epidemiological information. The expected result is Negative.  Fact Sheet for Patients:  01/25/21  Fact Sheet for Healthcare Providers:  01/26/21  This test is no t yet approved or cleared by the 01/26/21 FDA and  has been authorized for detection and/or diagnosis of SARS-CoV-2 by FDA under an Emergency Use Authorization (EUA). This EUA will remain  in effect (meaning this test can be used) for the duration of the COVID-19 declaration under Section 564(b)(1) of the Act, 21 U.S.C.section 360bbb-3(b)(1), unless the authorization is terminated  or revoked sooner.       Influenza A by PCR NEGATIVE NEGATIVE Final   Influenza B by PCR NEGATIVE NEGATIVE Final    Comment: (NOTE) The Xpert Xpress SARS-CoV-2/FLU/RSV plus assay is intended as an aid in the diagnosis of influenza from Nasopharyngeal swab specimens and should not be used as a sole basis for treatment. Nasal washings and aspirates are unacceptable for Xpert Xpress SARS-CoV-2/FLU/RSV testing.  Fact Sheet for Patients: BloggerCourse.com  Fact Sheet for Healthcare Providers: SeriousBroker.it  This test is not yet approved or cleared by the [place] FDA and has been authorized for detection and/or diagnosis of SARS-CoV-2 by FDA under  an Emergency Use Authorization (EUA). This EUA will remain in effect (meaning this test can be used) for the duration of the COVID-19 declaration under Section 564(b)(1) of the Act, 21 U.S.C. section 360bbb-3(b)(1), unless the authorization is terminated or revoked.  Performed at [facility], [address] [place], [place] [address]   Urine culture     Status: Abnormal   Collection Time: 01/23/21  6:25 PM   Specimen: In/Out Cath Urine  Result Value Ref Range Status   Specimen Description   Final    IN/OUT CATH URINE Performed at [facility], [address], [place], [place] [address]    Special Requests   Final    NONE Performed at [facility], [address], [place], [place] [address]    Culture (A)  Final    >=100,000 COLONIES/mL PSEUDOMONAS AERUGINOSA 40,000 COLONIES/mL KLEBSIELLA PNEUMONIAE Confirmed Extended Spectrum Beta-Lactamase Producer (ESBL).  In bloodstream infections from ESBL organisms, carbapenems are preferred over piperacillin/tazobactam. They are shown to have a lower risk of mortality.    Report Status 01/26/2021 FINAL  Final   Organism ID, Bacteria PSEUDOMONAS AERUGINOSA (A)  Final   Organism ID, Bacteria KLEBSIELLA PNEUMONIAE (A)  Final      Susceptibility   Klebsiella pneumoniae - MIC*    AMPICILLIN >=32 RESISTANT Resistant     CEFAZOLIN >=64 RESISTANT Resistant     CEFEPIME 2 SENSITIVE Sensitive     CEFTRIAXONE 32 RESISTANT Resistant     CIPROFLOXACIN 0.5 SENSITIVE Sensitive     GENTAMICIN <=1 SENSITIVE Sensitive     IMIPENEM <=0.25 SENSITIVE Sensitive     NITROFURANTOIN 128 RESISTANT Resistant     TRIMETH/SULFA >=320 RESISTANT Resistant     AMPICILLIN/SULBACTAM >=32 RESISTANT Resistant     PIP/TAZO >=128 RESISTANT Resistant     * 40,000 COLONIES/mL KLEBSIELLA PNEUMONIAE   Pseudomonas aeruginosa - MIC*    CEFTAZIDIME 4 SENSITIVE Sensitive     CIPROFLOXACIN <=0.25 SENSITIVE Sensitive     GENTAMICIN <=1 SENSITIVE Sensitive     IMIPENEM 2 SENSITIVE  Sensitive     PIP/TAZO <=4 SENSITIVE Sensitive     CEFEPIME 2 SENSITIVE Sensitive     * >=100,000 COLONIES/mL PSEUDOMONAS AERUGINOSA  Blood Culture (routine x 2)     Status: None (Preliminary result)   Collection Time: 01/23/21  6:35 PM   Specimen: BLOOD RIGHT FOREARM  Result Value Ref Range Status   Specimen Description BLOOD RIGHT FOREARM  Final   Special Requests   Final    BOTTLES DRAWN AEROBIC AND ANAEROBIC Blood Culture adequate volume   Culture   Final    NO GROWTH 4 DAYS Performed at [facility], [address], [place], [place] [address]    Report Status PENDING  Incomplete  Blood Culture (routine x 2)     Status: None (Preliminary result)   Collection Time: 01/23/21  6:35 PM   Specimen: BLOOD  Result Value Ref Range Status   Specimen Description BLOOD LEFT ANTECUBITAL  Final   Special Requests   Final    BOTTLES DRAWN AEROBIC AND ANAEROBIC Blood Culture adequate volume   Culture   Final    NO GROWTH 4 DAYS Performed at [facility], [address] [place], [place] [address]    Report Status PENDING  Incomplete  Culture, blood (routine x 2)     Status: None (Preliminary result)   Collection Time: 01/25/21  9:47 PM   Specimen: Left Antecubital; Blood  Result Value Ref Range Status   Specimen Description LEFT ANTECUBITAL  Final   Special Requests   Final    Blood Culture adequate volume BOTTLES DRAWN AEROBIC ONLY   Culture   Final    NO GROWTH 2 DAYS Performed at [facility], [address] [place], [place] [address]    Report Status PENDING  Incomplete  Culture, blood (routine x 2)     Status: None (Preliminary result)   Collection Time: 01/25/21  9:54 PM   Specimen: BLOOD RIGHT HAND  Result Value Ref Range Status   Specimen Description BLOOD RIGHT HAND  Final   Special Requests   Final    Blood Culture adequate volume BOTTLES DRAWN AEROBIC ONLY   Culture   Final    NO GROWTH 2 DAYS Performed at [facility], [address] [place], [place] [address]     Report Status PENDING  Incomplete     Radiology Studies: No results found.  Scheduled Meds: . amitriptyline  10 mg Oral QHS  . baclofen  10 mg Oral Q8H  . Chlorhexidine Gluconate Cloth  6 each Topical Daily  . cholecalciferol  2,000 Units Oral Daily  . enoxaparin (LOVENOX) injection  40 mg Subcutaneous Q24H  . hyoscyamine  0.375 mg Oral Q12H  . levETIRAcetam  250 mg Oral BID  . linaclotide  145 mcg Oral QAC breakfast  . lubiprostone  8 mcg Oral BID WC  . oxybutynin  10 mg Oral Daily  . pantoprazole  40 mg Oral QHS  . pravastatin  20 mg Oral q morning  . pregabalin  75 mg Oral Daily   And  . pregabalin  150 mg Oral QHS  . saccharomyces boulardii  250 mg Oral BID  . senna  2 tablet Oral BID   Continuous Infusions: . ceFEPime (MAXIPIME) IV 2 g (01/27/21 1030)  . lactated ringers 70 mL/hr at 01/26/21 1724     LOS: 4 days   Time spent: 35 mins   Joannie Medine Laural Benes, MD How to contact the [facility] Attending or Consulting provider 7A - 7P or covering provider during after hours 7P -7A, for this patient?  1. Check the care team in [facility] and look for a) attending/consulting TRH provider listed and b) the [facility] team listed 2. Log into [email or web address] and use [facility]'s universal password to access. If you do not have the password, please contact the hospital operator. 3. Locate the [facility] provider you are looking for under [facility] and page to a number that you can be directly reached. 4. If you still have difficulty reaching the provider, please page the [facility] (Director on Call) for the Hospitalists listed on amion for assistance.  01/27/2021, 12:35 PM

## 2021-01-27 NOTE — TOC Progression Note (Signed)
Transition of Care [facility]) - Progression Note    Patient Details  Name: Vincent Anthony MRN: [id number] Date of Birth: [birth year]  [facility] CM/SW Contact  Annice Needy, LCSW Phone Number: 01/27/2021, 5:11 PM  Clinical Narrative:    Insurance denied for skilled care. [name] advised that no additional bed offers available. Agreeable to return to [facility] when medically stable.    Expected Discharge Plan: Skilled Nursing Facility Barriers to Discharge: Continued Medical Work up  Expected Discharge Plan and Services Expected Discharge Plan: Skilled Nursing Facility       Living arrangements for the past 2 months: Skilled Nursing Facility                                       Social Determinants of Health (SDOH) Interventions    Readmission Risk Interventions No flowsheet data found.

## 2021-01-27 NOTE — Progress Notes (Signed)
Patient admitted from [facility] with foley in place.  When nurse went to remove the foley, there were 27 mL of water in the balloon when there should have only been 10 mL.  Foley was removed with no complications and condom cath was placed on patient.

## 2021-01-28 ENCOUNTER — Inpatient Hospital Stay (HOSPITAL_COMMUNITY): Payer: Medicare HMO

## 2021-01-28 LAB — COMPREHENSIVE METABOLIC PANEL
ALT: 30 U/L (ref 0–44)
AST: 27 U/L (ref 15–41)
Albumin: 2.7 g/dL — ABNORMAL LOW (ref 3.5–5.0)
Alkaline Phosphatase: 79 U/L (ref 38–126)
Anion gap: 8 (ref 5–15)
BUN: 9 mg/dL (ref 6–20)
CO2: 25 mmol/L (ref 22–32)
Calcium: 8.6 mg/dL — ABNORMAL LOW (ref 8.9–10.3)
Chloride: 105 mmol/L (ref 98–111)
Creatinine, Ser: 0.87 mg/dL (ref 0.61–1.24)
GFR, Estimated: >60 mL/min (ref >60)
Glucose, Bld: 102 mg/dL — ABNORMAL HIGH (ref 70–99)
Potassium: 3.6 mmol/L (ref 3.5–5.1)
Sodium: 138 mmol/L (ref 135–145)
Total Bilirubin: 0.6 mg/dL (ref 0.3–1.2)
Total Protein: 5.9 g/dL — ABNORMAL LOW (ref 6.5–8.1)

## 2021-01-28 LAB — CBC
HCT: 28.2 % — ABNORMAL LOW (ref 39.0–52.0)
Hemoglobin: 8.9 g/dL — ABNORMAL LOW (ref 13.0–17.0)
MCH: 27.9 pg (ref 26.0–34.0)
MCHC: 31.6 g/dL (ref 30.0–36.0)
MCV: 88.4 fL (ref 80.0–100.0)
Platelets: 438 10*3/uL — ABNORMAL HIGH (ref 150–400)
RBC: 3.19 MIL/uL — ABNORMAL LOW (ref 4.22–5.81)
RDW: 17.5 % — ABNORMAL HIGH (ref 11.5–15.5)
WBC: 16.7 10*3/uL — ABNORMAL HIGH (ref 4.0–10.5)
nRBC: 0 % (ref 0.0–0.2)

## 2021-01-28 LAB — CULTURE, BLOOD (ROUTINE X 2)
Culture: NO GROWTH
Culture: NO GROWTH
Special Requests: ADEQUATE
Special Requests: ADEQUATE

## 2021-01-28 LAB — MAGNESIUM: Magnesium: 1.8 mg/dL (ref 1.7–2.4)

## 2021-01-28 IMAGING — DX DG CHEST 1V PORT
1 series · 1 of 1 positions shown · non-contrast
Comparison: [DATE]

CLINICAL DATA: Fever

EXAM:
PORTABLE CHEST 1 VIEW

[chest ap]
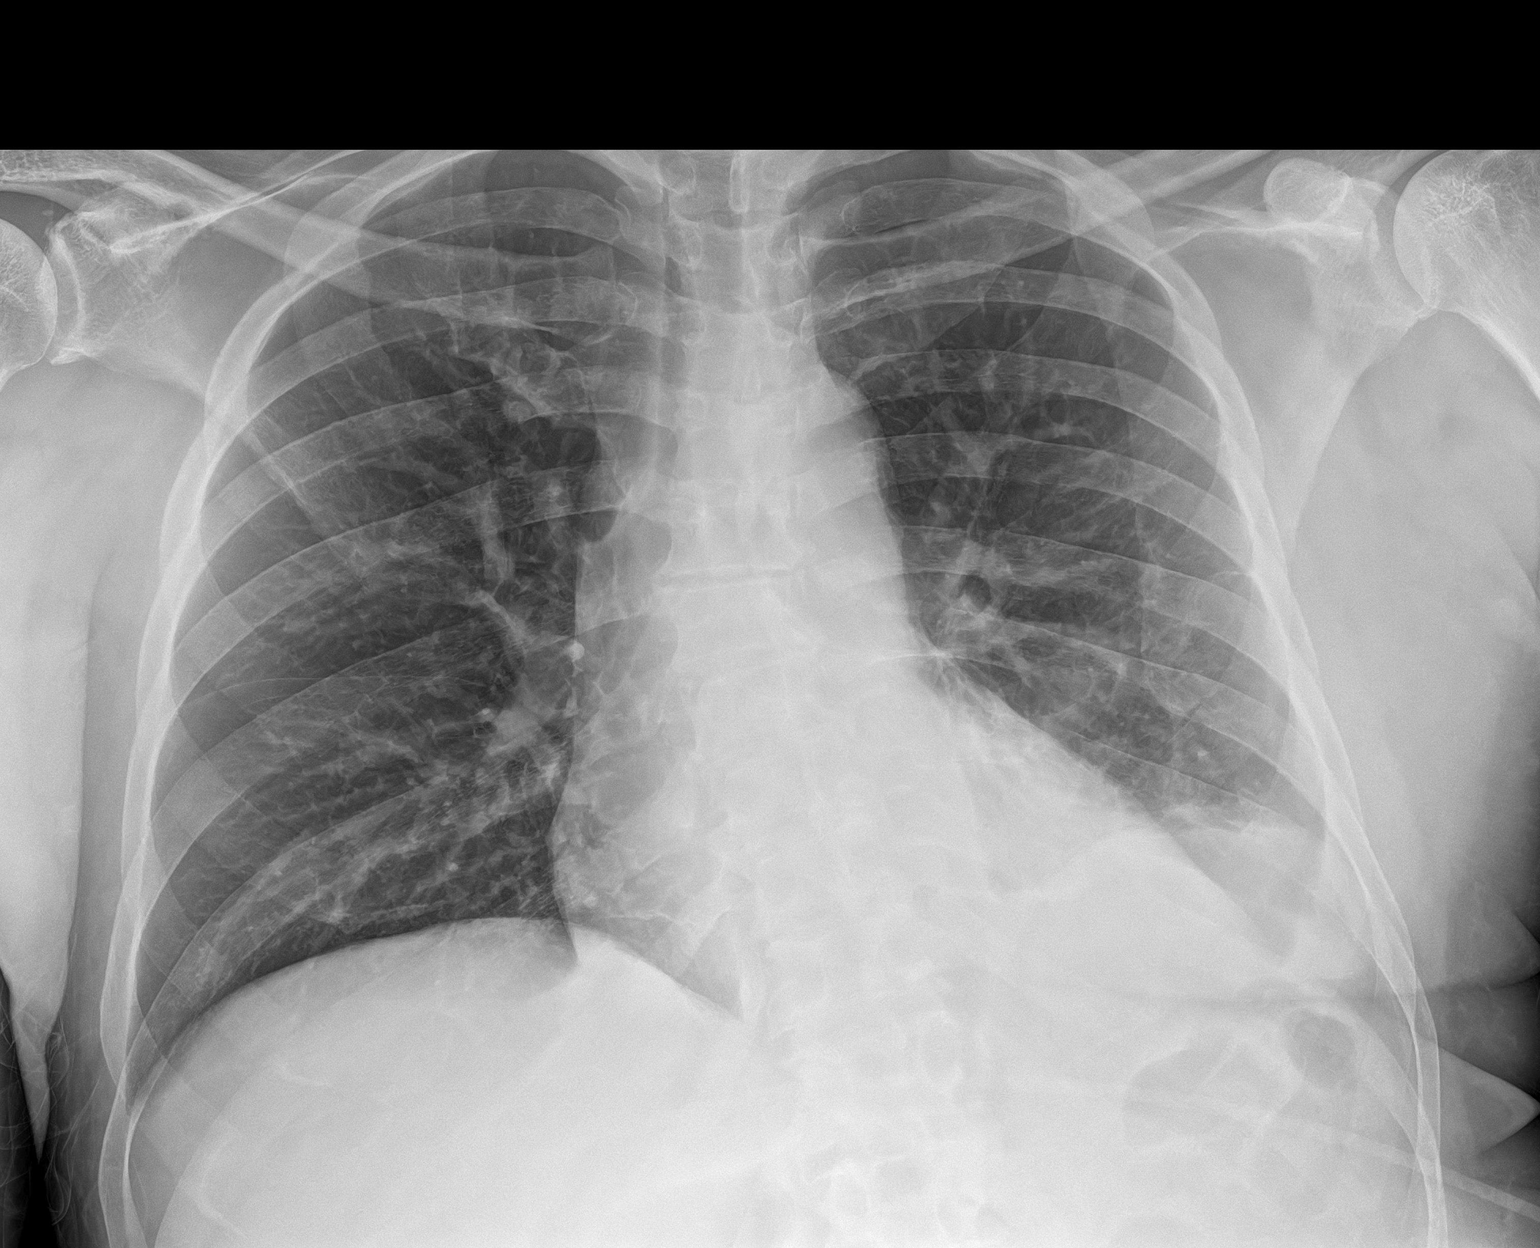

[1 of 1 positions shown; findings below may reference images not displayed]

FINDINGS: There is airspace opacity in the left base. Lungs elsewhere clear.
Heart size and pulmonary vascularity are normal. No adenopathy. No
bone lesions.
IMPRESSION: Airspace opacity likely representing pneumonia left base. Lungs
elsewhere clear. Heart size within normal limits. No adenopathy
evident.

## 2021-01-28 NOTE — Progress Notes (Signed)
Physical Therapy Treatment Patient Details Name: Vincent Anthony MRN: [id number] DOB: 07-04-1971 Today's Date: 01/28/2021    History of Present Illness Vincent Anthony  is a 50 y.o. male, medical history of UltraPulse sclerosis, COVID-pneumonia in February, diabetes mellitus, with recent hospitalization due to sepsis from UTI, retention with Foley catheter for last 46-month, he presents from facility due to fever, progressive weakness, patient to ports he was at his usual state of health as yesterday, he is bedbound at baseline, reported this morning was feeling weak, tired, and he had fever of 104 at the facility which brought him to ED, he denies any cough, shortness of breath, abdominal pain, nausea or vomiting, he has chronic indwelling Foley.  - in ED patient had fever 100.9, white blood cell elevated at 18 K, chest x-ray with no acute findings, UA was suggestive of UTI, blood cultures, urine cultures were sent, [facility] hospitalist consulted to admit.    PT Comments    PT refused treatment in AM due to not feeling well; willing to participate in PM.  Wife present for treatment.  Transferred to EOS with mod assist and was able to maintain sitting balance independently.  LE therex completed for long arc quad with less than full ROM achieved due to weakness and postural cues to remain upright when completing Rt LE. PT was unable to complete but 3 reps of marching for Lt LE and unable to lift RT LE.  Encouraged to complete LE movements/therex throughout the day and educated patient and spouse on presure relief and the purpose for using the multipodus boots therapist donned at EOS. Wife very supportive and motivating for patient to improve.                  Recommendations for Other Services OT consult     Precautions / Restrictions Precautions Precautions: Fall Precaution Comments: Tends to extend    Mobility  Bed Mobility Overal bed mobility: Needs Assistance Bed Mobility: Supine to  Sit;Sit to Supine     Supine to sit: Mod assist;Max assist Sit to supine: Mod assist;Max assist   General bed mobility comments: labored movement, limited use of BUE due to weakness                        Cognition Arousal/Alertness: Awake/alert Behavior During Therapy: WFL for tasks assessed/performed Overall Cognitive Status: Within Functional Limits for tasks assessed                                        Exercises General Exercises - Lower Extremity Ankle Circles/Pumps: AROM;AAROM;Right;Left;10 reps Long Arc Quad: AROM;Right;Left;10 reps Hip Flexion/Marching: AROM;5 reps;Left        Pertinent Vitals/Pain Pain Assessment: No/denies pain           PT Goals (current goals can now be found in the care plan section) Acute Rehab PT Goals Patient Stated Goal: return to SNF for rehab PT Goal Formulation: With patient Potential to Achieve Goals: Fair Progress towards PT goals: Progressing toward goals    Frequency    Min 3X/week      PT Plan  Continue to improve toward goals.       AM-PAC PT "6 Clicks" Mobility   Outcome Measure  Help needed turning from your back to your side while in a flat bed without using bedrails?: A Lot Help needed moving from  lying on your back to sitting on the side of a flat bed without using bedrails?: A Lot Help needed moving to and from a bed to a chair (including a wheelchair)?: Total Help needed standing up from a chair using your arms (e.g., wheelchair or bedside chair)?: Total Help needed to walk in hospital room?: Total Help needed climbing 3-5 steps with a railing? : Total 6 Click Score: 8    End of Session   Activity Tolerance: Patient tolerated treatment well;Patient limited by fatigue Patient left: in bed;with call bell/phone within reach;with family/visitor present Nurse Communication: Mobility status PT Visit Diagnosis: Muscle weakness (generalized) (M62.81);Difficulty in walking, not  elsewhere classified (R26.2);Other symptoms and signs involving the nervous system (R29.898)     Time: 1630-1700 PT Time Calculation (min) (ACUTE ONLY): 30 min  Charges:  $Therapeutic Exercise: 8-22 mins $Therapeutic Activity: 8-22 mins                    Vincent Anthony, PTA/CLT [phone]    Vincent Anthony, Vincent Anthony 01/28/2021, 5:16 PM

## 2021-01-28 NOTE — Progress Notes (Signed)
PROGRESS NOTE   Heloise Purpurahomas J Delao  ZOX:[id number] DOB: Feb 13, 1971 DOA: 01/23/2021 PCP: Smith RobertKikel, Stephen, MD   Chief Complaint  Patient presents with  . Fever   Level of care: Med-Surg  Brief Admission History:  50 y.o. male, medical history of multiple sclerosis, COVID-pneumonia in February, diabetes mellitus, with recent hospitalization due to sepsis from UTI, retention with Foley catheter for last 3021-month, he presents from facility due to fever, progressive weakness, patient to ports he was at his usual state of health as yesterday, he is bedbound at baseline, reported this morning was feeling weak, tired, and he had fever of 104 at the facility which brought him to ED, he denies any cough, shortness of breath, abdominal pain, nausea or vomiting, he has chronic indwelling Foley.  In ED patient had fever 100.9, white blood cell elevated at 18 K, chest x-ray with no acute findings, UA was suggestive of UTI, blood cultures, urine cultures were sent, [facility] hospitalist consulted to admit.  Assessment & Plan:   Active Problems:   MS (multiple sclerosis) (HCC)   Absent testis, acquired   Chronic pain syndrome   Neurogenic bladder   Immunosuppressed status (HCC)   UTI (urinary tract infection)  Pseudomonas and Klebsiella Catheter associated UTI - indwelling foley has been removed. With culture results and sensitivities I have started him on cefepime.  DC vanc and ceftriaxone.  Continue supportive measures.  Pt has been voiding in condom cath since foley was removed.        Leukocytosis - with WBC increasing, will check portable CXR to check for aspiration pneumonia.      MS associated neurogenic bladder - Foley was placed 2 months back at SNF due to incontinence, Urologist Dr Mena GoesEskridge was contacted during admission and recommended trial of removing foley MS with voiding trial.  He is tolerating the condom cath and would not replace the foley at this time unless he developed urinary incontinence.     Chronic pain - He has been on lyrica and baclofen which is continued.   Hyperlipidemia - resumed home statin therapy.   Hypomagnesemia - this has been repleted.   Constipation - he has been resumed on home bowel regimen.   GAD - remains on home alprazolam.   DVT prophylaxis: enoxaparin  Code Status: full  Family Communication: wife updated by telephone 5/1  Disposition: return SNF when medically stabilized Status is: Inpatient  Remains inpatient appropriate because:IV treatments appropriate due to intensity of illness or inability to take PO and Inpatient level of care appropriate due to severity of illness  Dispo:  Patient From: Skilled Nursing Facility  Planned Disposition: Skilled Nursing Facility  Medically stable for discharge: No    Consultants:     Procedures:     Antimicrobials:  Vancomycin 4/28>>5/1 ceftrixaone 4/28>>5/1 Cefepime 5/1>>  Subjective: Pt reports he still not quite back to baseline and had a low grade temp this morning.    Objective: Vitals:   01/27/21 0802 01/27/21 1414 01/27/21 2014 01/28/21 0536  BP: 116/71 115/72 128/71 115/67  Pulse: 98 96 93 96  Resp:  19  19  Temp:  98.8 F (37.1 C) 99.3 F (37.4 C) (!) 100.8 F (38.2 C)  TempSrc:  Oral Oral Oral  SpO2:  97% 96% 97%  Weight:      Height:        Intake/Output Summary (Last 24 hours) at 01/28/2021 1235 Last data filed at 01/28/2021 0900 Gross per 24 hour  Intake 3074.45 ml  Output  5001 ml  Net -1926.55 ml   Filed Weights   01/23/21 1809  Weight: 92.1 kg   Examination:  General exam: chronically ill appearing male, Appears calm and comfortable  Respiratory system: Clear to auscultation. Respiratory effort normal. Cardiovascular system: normal S1 & S2 heard. No JVD, murmurs, rubs, gallops or clicks. No pedal edema. Gastrointestinal system: Abdomen is nondistended, soft and nontender. No organomegaly or masses felt. Normal bowel sounds heard. GU: foley cath in place.  Amber urine seen.  (foley to be removed today), normal appearing external genitalia.  Central nervous system: Alert and oriented x3. Extremities: Symmetric 5 x 5 power. Bilateral heel protectors.  Skin: No rashes, lesions or ulcers Psychiatry: Judgement and insight appear normal. Mood & affect appropriate.   Data Reviewed: I have personally reviewed following labs and imaging studies  CBC: Recent Labs  Lab 01/23/21 1835 01/24/21 0503 01/25/21 0513 01/26/21 0529 01/27/21 0635 01/28/21 0502  WBC 18.1* 14.9* 15.7* 13.6* 15.2* 16.7*  NEUTROABS 15.5*  --  12.9*  --   --   --   HGB 11.0* 9.7* 9.1* 9.0* 9.5* 8.9*  HCT 34.9* 31.7* 28.9* 28.8* 30.0* 28.2*  MCV 89.0 91.1 88.4 88.1 88.2 88.4  PLT 308 251 314 360 321 438*    Basic Metabolic Panel: Recent Labs  Lab 01/24/21 0503 01/25/21 0513 01/26/21 0529 01/27/21 0635 01/28/21 0502  NA 141 138 139 138 138  K 3.9 3.6 3.6 3.8 3.6  CL 107 104 106 106 105  CO2 26 26 26 24 25   GLUCOSE 113* 110* 134* 102* 102*  BUN 10 7 10 9 9   CREATININE 1.03 0.93 1.00 0.90 0.87  CALCIUM 8.4* 8.5* 8.0* 8.5* 8.6*  MG  --  1.4* 2.0 1.9 1.8    GFR: Estimated Creatinine Clearance: 121.4 mL/min (by C-G formula based on SCr of 0.87 mg/dL).  Liver Function Tests: Recent Labs  Lab 01/23/21 1835 01/26/21 0529 01/27/21 0635 01/28/21 0502  AST 16 20 22 27   ALT 16 21 25 30   ALKPHOS 94 79 83 79  BILITOT 0.8 0.7 0.6 0.6  PROT 6.6 5.4* 5.9* 5.9*  ALBUMIN 3.3* 2.5* 2.7* 2.7*    CBG: Recent Labs  Lab 01/23/21 2216 01/24/21 0753 01/24/21 1119  GLUCAP 101* 93 97    Recent Results (from the past 240 hour(s))  Resp Panel by RT-PCR (Flu A&B, Covid) Nasopharyngeal Swab     Status: None   Collection Time: 01/23/21  6:22 PM   Specimen: Nasopharyngeal Swab; Nasopharyngeal(NP) swabs in vial transport medium  Result Value Ref Range Status   SARS Coronavirus 2 by RT PCR NEGATIVE NEGATIVE Final    Comment: (NOTE) SARS-CoV-2 target nucleic acids are  NOT DETECTED.  The SARS-CoV-2 RNA is generally detectable in upper respiratory specimens during the acute phase of infection. The lowest concentration of SARS-CoV-2 viral copies this assay can detect is 138 copies/mL. A negative result does not preclude SARS-Cov-2 infection and should not be used as the sole basis for treatment or other patient management decisions. A negative result may occur with  improper specimen collection/handling, submission of specimen other than nasopharyngeal swab, presence of viral mutation(s) within the areas targeted by this assay, and inadequate number of viral copies(<138 copies/mL). A negative result must be combined with clinical observations, patient history, and epidemiological information. The expected result is Negative.  Fact Sheet for Patients:  01/25/21  Fact Sheet for Healthcare Providers:  01/26/21  This test is no t yet approved or cleared by the  [place] [employer] and  has been authorized for detection and/or diagnosis of SARS-CoV-2 by FDA under an TEFL teacher (EUA). This EUA will remain  in effect (meaning this test can be used) for the duration of the COVID-19 declaration under Section 564(b)(1) of the Act, 21 U.S.C.section 360bbb-3(b)(1), unless the authorization is terminated  or revoked sooner.       Influenza A by PCR NEGATIVE NEGATIVE Final   Influenza B by PCR NEGATIVE NEGATIVE Final    Comment: (NOTE) The Xpert Xpress SARS-CoV-2/FLU/RSV plus assay is intended as an aid in the diagnosis of influenza from Nasopharyngeal swab specimens and should not be used as a sole basis for treatment. Nasal washings and aspirates are unacceptable for Xpert Xpress SARS-CoV-2/FLU/RSV testing.  Fact Sheet for Patients: BloggerCourse.com  Fact Sheet for Healthcare Providers: SeriousBroker.it  This test is not  yet approved or cleared by the [place] FDA and has been authorized for detection and/or diagnosis of SARS-CoV-2 by FDA under an Emergency Use Authorization (EUA). This EUA will remain in effect (meaning this test can be used) for the duration of the COVID-19 declaration under Section 564(b)(1) of the Act, 21 U.S.C. section 360bbb-3(b)(1), unless the authorization is terminated or revoked.  Performed at [facility], [address] [place], [place] [address]   Urine culture     Status: Abnormal   Collection Time: 01/23/21  6:25 PM   Specimen: In/Out Cath Urine  Result Value Ref Range Status   Specimen Description   Final    IN/OUT CATH URINE Performed at [facility], [address], [place], [place] [address]    Special Requests   Final    NONE Performed at [facility], [address] [place], [place] [address]    Culture (A)  Final    >=100,000 COLONIES/mL PSEUDOMONAS AERUGINOSA 40,000 COLONIES/mL KLEBSIELLA PNEUMONIAE Confirmed Extended Spectrum Beta-Lactamase Producer (ESBL).  In bloodstream infections from ESBL organisms, carbapenems are preferred over piperacillin/tazobactam. They are shown to have a lower risk of mortality.    Report Status 01/26/2021 FINAL  Final   Organism ID, Bacteria PSEUDOMONAS AERUGINOSA (A)  Final   Organism ID, Bacteria KLEBSIELLA PNEUMONIAE (A)  Final      Susceptibility   Klebsiella pneumoniae - MIC*    AMPICILLIN >=32 RESISTANT Resistant     CEFAZOLIN >=64 RESISTANT Resistant     CEFEPIME 2 SENSITIVE Sensitive     CEFTRIAXONE 32 RESISTANT Resistant     CIPROFLOXACIN 0.5 SENSITIVE Sensitive     GENTAMICIN <=1 SENSITIVE Sensitive     IMIPENEM <=0.25 SENSITIVE Sensitive     NITROFURANTOIN 128 RESISTANT Resistant     TRIMETH/SULFA >=320 RESISTANT Resistant     AMPICILLIN/SULBACTAM >=32 RESISTANT Resistant     PIP/TAZO >=128 RESISTANT Resistant     * 40,000 COLONIES/mL KLEBSIELLA PNEUMONIAE   Pseudomonas aeruginosa - MIC*    CEFTAZIDIME  4 SENSITIVE Sensitive     CIPROFLOXACIN <=0.25 SENSITIVE Sensitive     GENTAMICIN <=1 SENSITIVE Sensitive     IMIPENEM 2 SENSITIVE Sensitive     PIP/TAZO <=4 SENSITIVE Sensitive     CEFEPIME 2 SENSITIVE Sensitive     * >=100,000 COLONIES/mL PSEUDOMONAS AERUGINOSA  Blood Culture (routine x 2)     Status: None   Collection Time: 01/23/21  6:35 PM   Specimen: BLOOD RIGHT FOREARM  Result Value Ref Range Status   Specimen Description BLOOD RIGHT FOREARM  Final   Special Requests   Final    BOTTLES DRAWN AEROBIC  AND ANAEROBIC Blood Culture adequate volume   Culture   Final    NO GROWTH 5 DAYS Performed at [facility], [address] [place], [place] [address]    Report Status 01/28/2021 FINAL  Final  Blood Culture (routine x 2)     Status: None   Collection Time: 01/23/21  6:35 PM   Specimen: BLOOD  Result Value Ref Range Status   Specimen Description BLOOD LEFT ANTECUBITAL  Final   Special Requests   Final    BOTTLES DRAWN AEROBIC AND ANAEROBIC Blood Culture adequate volume   Culture   Final    NO GROWTH 5 DAYS Performed at [facility], [address] [place], [place] [address]    Report Status 01/28/2021 FINAL  Final  Culture, blood (routine x 2)     Status: None (Preliminary result)   Collection Time: 01/25/21  9:47 PM   Specimen: Left Antecubital; Blood  Result Value Ref Range Status   Specimen Description LEFT ANTECUBITAL  Final   Special Requests   Final    Blood Culture adequate volume BOTTLES DRAWN AEROBIC ONLY   Culture   Final    NO GROWTH 3 DAYS Performed at [facility], [address] [place], [place] [address]    Report Status PENDING  Incomplete  Culture, blood (routine x 2)     Status: None (Preliminary result)   Collection Time: 01/25/21  9:54 PM   Specimen: BLOOD RIGHT HAND  Result Value Ref Range Status   Specimen Description BLOOD RIGHT HAND  Final   Special Requests   Final    Blood Culture adequate volume BOTTLES DRAWN AEROBIC ONLY   Culture   Final     NO GROWTH 3 DAYS Performed at [facility], [address] [place], [place] [address]    Report Status PENDING  Incomplete     Radiology Studies: No results found.  Scheduled Meds: . amitriptyline  10 mg Oral QHS  . baclofen  10 mg Oral Q8H  . Chlorhexidine Gluconate Cloth  6 each Topical Daily  . cholecalciferol  2,000 Units Oral Daily  . enoxaparin (LOVENOX) injection  40 mg Subcutaneous Q24H  . hyoscyamine  0.375 mg Oral Q12H  . levETIRAcetam  250 mg Oral BID  . linaclotide  145 mcg Oral QAC breakfast  . lubiprostone  8 mcg Oral BID WC  . oxybutynin  10 mg Oral Daily  . pantoprazole  40 mg Oral QHS  . pravastatin  20 mg Oral q morning  . pregabalin  75 mg Oral Daily   And  . pregabalin  150 mg Oral QHS  . saccharomyces boulardii  250 mg Oral BID   Continuous Infusions: . ceFEPime (MAXIPIME) IV 2 g (01/28/21 1033)  . lactated ringers 35 mL/hr at 01/28/21 0850     LOS: 5 days   Time spent: 35 mins   Dodge Ator Laural Benes, MD How to contact the [facility] Attending or Consulting provider 7A - 7P or covering provider during after hours 7P -7A, for this patient?  1. Check the care team in [facility] and look for a) attending/consulting [facility] provider listed and b) the [facility] team listed 2. Log into [email or web address] and use [place]'s universal password to access. If you do not have the password, please contact the hospital operator. 3. Locate the [facility] provider you are looking for under [facility] and page to a number that you can be directly reached. 4. If you still have difficulty reaching the provider, please page  the [facility] (Director on Call) for the Hospitalists listed on amion for assistance.  01/28/2021, 12:35 PM

## 2021-01-29 DIAGNOSIS — G35 Multiple sclerosis: Secondary | ICD-10-CM | POA: Diagnosis not present

## 2021-01-29 DIAGNOSIS — N319 Neuromuscular dysfunction of bladder, unspecified: Secondary | ICD-10-CM | POA: Diagnosis not present

## 2021-01-29 DIAGNOSIS — T83511A Infection and inflammatory reaction due to indwelling urethral catheter, initial encounter: Secondary | ICD-10-CM | POA: Diagnosis not present

## 2021-01-29 DIAGNOSIS — N390 Urinary tract infection, site not specified: Secondary | ICD-10-CM | POA: Diagnosis not present

## 2021-01-29 LAB — CBC WITH DIFFERENTIAL/PLATELET
Abs Immature Granulocytes: 0.11 10*3/uL — ABNORMAL HIGH (ref 0.00–0.07)
Basophils Absolute: 0.1 10*3/uL (ref 0.0–0.1)
Basophils Relative: 0 %
Eosinophils Absolute: 0.2 10*3/uL (ref 0.0–0.5)
Eosinophils Relative: 1 %
HCT: 29.3 % — ABNORMAL LOW (ref 39.0–52.0)
Hemoglobin: 9.1 g/dL — ABNORMAL LOW (ref 13.0–17.0)
Immature Granulocytes: 1 %
Lymphocytes Relative: 4 %
Lymphs Abs: 0.7 10*3/uL (ref 0.7–4.0)
MCH: 27.8 pg (ref 26.0–34.0)
MCHC: 31.1 g/dL (ref 30.0–36.0)
MCV: 89.6 fL (ref 80.0–100.0)
Monocytes Absolute: 1.2 10*3/uL — ABNORMAL HIGH (ref 0.1–1.0)
Monocytes Relative: 7 %
Neutro Abs: 14 10*3/uL — ABNORMAL HIGH (ref 1.7–7.7)
Neutrophils Relative %: 87 %
Platelets: 471 10*3/uL — ABNORMAL HIGH (ref 150–400)
RBC: 3.27 MIL/uL — ABNORMAL LOW (ref 4.22–5.81)
RDW: 18 % — ABNORMAL HIGH (ref 11.5–15.5)
WBC: 16.3 10*3/uL — ABNORMAL HIGH (ref 4.0–10.5)
nRBC: 0 % (ref 0.0–0.2)

## 2021-01-29 LAB — COMPREHENSIVE METABOLIC PANEL
ALT: 33 U/L (ref 0–44)
AST: 30 U/L (ref 15–41)
Albumin: 2.7 g/dL — ABNORMAL LOW (ref 3.5–5.0)
Alkaline Phosphatase: 74 U/L (ref 38–126)
Anion gap: 9 (ref 5–15)
BUN: 9 mg/dL (ref 6–20)
CO2: 24 mmol/L (ref 22–32)
Calcium: 8.3 mg/dL — ABNORMAL LOW (ref 8.9–10.3)
Chloride: 105 mmol/L (ref 98–111)
Creatinine, Ser: 0.91 mg/dL (ref 0.61–1.24)
GFR, Estimated: >60 mL/min (ref >60)
Glucose, Bld: 89 mg/dL (ref 70–99)
Potassium: 3.8 mmol/L (ref 3.5–5.1)
Sodium: 138 mmol/L (ref 135–145)
Total Bilirubin: 0.6 mg/dL (ref 0.3–1.2)
Total Protein: 5.9 g/dL — ABNORMAL LOW (ref 6.5–8.1)

## 2021-01-29 MED ORDER — SODIUM CHLORIDE 0.9 % IV SOLN
500.0000 mg | INTRAVENOUS | Status: DC
  Administered 2021-01-29: 500 mg via INTRAVENOUS
  Filled 2021-01-29: qty 500

## 2021-01-29 NOTE — Progress Notes (Addendum)
PROGRESS NOTE   Vincent Anthony J Shark  ZOX:[id number] DOB: [birth year] DOA: 01/23/2021 PCP: Vincent Anthony, Stephen, MD   Chief Complaint  Patient presents with  . Fever   Level of care: Med-Surg  Brief Admission History:  [age] y.o. male, medical history of multiple sclerosis, COVID-pneumonia in February, diabetes mellitus, with recent hospitalization due to sepsis from UTI, retention with Foley catheter for last 4453-month, he presents from facility due to fever, progressive weakness, patient to ports he was at his usual state of health as yesterday, he is bedbound at baseline, reported this morning was feeling weak, tired, and he had fever of 104 at the facility which brought him to ED, he denies any cough, shortness of breath, abdominal pain, nausea or vomiting, he has chronic indwelling Foley.  In ED patient had fever 100.9, white blood cell elevated at 18 K, chest x-ray with no acute findings, UA was suggestive of UTI, blood cultures, urine cultures were sent, [facility] hospitalist consulted to admit.  Assessment & Plan:   Active Problems:   MS (multiple sclerosis) (HCC)   Absent testis, acquired   Chronic pain syndrome   Neurogenic bladder   Immunosuppressed status (HCC)   UTI (urinary tract infection)  Pseudomonas and MDR Klebsiella Catheter associated UTI - POA- ---patient came from SNF with indwelling foley which has Now been removed.  With culture results and sensitivities started on cefepime.  DCed vanc and ceftriaxone.   -Possible discharge on 01/30/2021 after completing 5 days of IV cefepime for Pseudomonas and Klebsiella complicated UTI/CAUTI (patient came to the hospital with an indwelling Foley catheter which has now been removed)   Pt has been voiding in condom cath since foley was removed-post void residual showed a 75 ml -Leukocytosis persist  -Left-sided pneumonia--persistent leukocytosis, chest x-ray from 01/28/2021 consistent with pneumonia -Continue cefepime but add  azithromycin -Leukocytosis persist  MS associated neurogenic bladder - Foley was placed 2 months back at SNF due to incontinence, Urologist Dr Vincent GoesEskridge was contacted during admission and recommended trial of removing foley MS with voiding trial. -  Pt has been voiding in condom cath since foley was removed-post void residual showed a 75 ml - Chronic pain - He has been on lyrica and baclofen which is continued.   Hyperlipidemia - resumed home statin therapy.   Hypomagnesemia - this has been repleted.   Constipation -continue laxatives  GAD - remains on home alprazolam.   DVT prophylaxis: enoxaparin  Code Status: full  Family Communication: wife updated by telephone 5/1  Disposition: return SNF when medically stabilized Status is: Inpatient  Remains inpatient appropriate because:IV treatments appropriate due to intensity of illness or inability to take PO and Inpatient level of care appropriate due to severity of illness  Dispo:  Patient From: Skilled Nursing Facility  Planned Disposition: Skilled Nursing Facility  Medically stable for discharge: No    Consultants:     Procedures:     Antimicrobials:  Vancomycin 4/28>>5/1 ceftrixaone 4/28>>5/1 Cefepime 5/1>> Azithro 01/29/21  Subjective: -Persistent leukocytosis -Low-grade temps -fatigue,  Objective: Vitals:   01/28/21 1932 01/28/21 2030 01/29/21 0520 01/29/21 1317  BP: 116/69  118/74 115/72  Pulse: 93  85 82  Resp: 19  20 16   Temp: 98.2 F (36.8 C)  98.7 F (37.1 C) 99.1 F (37.3 C)  TempSrc:      SpO2: 97% 96% 93% 98%  Weight:      Height:        Intake/Output Summary (Last 24 hours) at 01/29/2021 1829 Last data  filed at 01/29/2021 1300 Gross per 24 hour  Intake 960 ml  Output 3975 ml  Net -3015 ml   Filed Weights   01/23/21 1809  Weight: 92.1 kg   Examination:  General exam: chronically ill appearing male, Appears calm and comfortable  Respiratory system: Clear to auscultation. Respiratory  effort normal. Cardiovascular system: normal S1 & S2 heard. No JVD, murmurs, rubs, gallops or clicks. No pedal edema. Gastrointestinal system: Abdomen is nondistended, soft and nontender.  Normal bowel sounds heard. Central nervous system: Alert and oriented x3. Extremities: Bilateral heel protectors.  Skin: No rashes, lesions or ulcers Psychiatry: Judgement and insight appear normal. Mood & affect appropriate.   Data Reviewed: I have personally reviewed following labs and imaging studies  CBC: Recent Labs  Lab 01/23/21 1835 01/24/21 0503 01/25/21 0513 01/26/21 0529 01/27/21 0635 01/28/21 0502 01/29/21 0437  WBC 18.1*   < > 15.7* 13.6* 15.2* 16.7* 16.3*  NEUTROABS 15.5*  --  12.9*  --   --   --  14.0*  HGB 11.0*   < > 9.1* 9.0* 9.5* 8.9* 9.1*  HCT 34.9*   < > 28.9* 28.8* 30.0* 28.2* 29.3*  MCV 89.0   < > 88.4 88.1 88.2 88.4 89.6  PLT 308   < > 314 360 321 438* 471*   < > = values in this interval not displayed.    Basic Metabolic Panel: Recent Labs  Lab 01/25/21 0513 01/26/21 0529 01/27/21 0635 01/28/21 0502 01/29/21 0437  NA 138 139 138 138 138  K 3.6 3.6 3.8 3.6 3.8  CL 104 106 106 105 105  CO2 26 26 24 25 24   GLUCOSE 110* 134* 102* 102* 89  BUN 7 10 9 9 9   CREATININE 0.93 1.00 0.90 0.87 0.91  CALCIUM 8.5* 8.0* 8.5* 8.6* 8.3*  MG 1.4* 2.0 1.9 1.8  --     GFR: Estimated Creatinine Clearance: 116.1 mL/min (by C-G formula based on SCr of 0.91 mg/dL).  Liver Function Tests: Recent Labs  Lab 01/23/21 1835 01/26/21 0529 01/27/21 0635 01/28/21 0502 01/29/21 0437  AST 16 20 22 27 30   ALT 16 21 25 30  33  ALKPHOS 94 79 83 79 74  BILITOT 0.8 0.7 0.6 0.6 0.6  PROT 6.6 5.4* 5.9* 5.9* 5.9*  ALBUMIN 3.3* 2.5* 2.7* 2.7* 2.7*    CBG: Recent Labs  Lab 01/23/21 2216 01/24/21 0753 01/24/21 1119  GLUCAP 101* 93 97    Recent Results (from the past 240 hour(s))  Resp Panel by RT-PCR (Flu A&B, Covid) Nasopharyngeal Swab     Status: None   Collection Time:  01/23/21  6:22 PM   Specimen: Nasopharyngeal Swab; Nasopharyngeal(NP) swabs in vial transport medium  Result Value Ref Range Status   SARS Coronavirus 2 by RT PCR NEGATIVE NEGATIVE Final    Comment: (NOTE) SARS-CoV-2 target nucleic acids are NOT DETECTED.  The SARS-CoV-2 RNA is generally detectable in upper respiratory specimens during the acute phase of infection. The lowest concentration of SARS-CoV-2 viral copies this assay can detect is 138 copies/mL. A negative result does not preclude SARS-Cov-2 infection and should not be used as the sole basis for treatment or other patient management decisions. A negative result may occur with  improper specimen collection/handling, submission of specimen other than nasopharyngeal swab, presence of viral mutation(s) within the areas targeted by this assay, and inadequate number of viral copies(<138 copies/mL). A negative result must be combined with clinical observations, patient history, and epidemiological information. The expected result is  Negative.  Fact Sheet for Patients:  BloggerCourse.com  Fact Sheet for Healthcare Providers:  SeriousBroker.it  This test is no t yet approved or cleared by the [place] FDA and  has been authorized for detection and/or diagnosis of SARS-CoV-2 by FDA under an Emergency Use Authorization (EUA). This EUA will remain  in effect (meaning this test can be used) for the duration of the COVID-19 declaration under Section 564(b)(1) of the Act, 21 U.S.C.section 360bbb-3(b)(1), unless the authorization is terminated  or revoked sooner.       Influenza A by PCR NEGATIVE NEGATIVE Final   Influenza B by PCR NEGATIVE NEGATIVE Final    Comment: (NOTE) The Xpert Xpress SARS-CoV-2/FLU/RSV plus assay is intended as an aid in the diagnosis of influenza from Nasopharyngeal swab specimens and should not be used as a sole basis for treatment. Nasal washings  and aspirates are unacceptable for Xpert Xpress SARS-CoV-2/FLU/RSV testing.  Fact Sheet for Patients: BloggerCourse.com  Fact Sheet for Healthcare Providers: SeriousBroker.it  This test is not yet approved or cleared by the [place] FDA and has been authorized for detection and/or diagnosis of SARS-CoV-2 by FDA under an Emergency Use Authorization (EUA). This EUA will remain in effect (meaning this test can be used) for the duration of the COVID-19 declaration under Section 564(b)(1) of the Act, 21 U.S.C. section 360bbb-3(b)(1), unless the authorization is terminated or revoked.  Performed at [facility], [address] [place], [place] [address]   Urine culture     Status: Abnormal   Collection Time: 01/23/21  6:25 PM   Specimen: In/Out Cath Urine  Result Value Ref Range Status   Specimen Description   Final    IN/OUT CATH URINE Performed at [facility], [address], [place], [place] [address]    Special Requests   Final    NONE Performed at [facility], [address], [place], [place] [address]    Culture (A)  Final    >=100,000 COLONIES/mL PSEUDOMONAS AERUGINOSA 40,000 COLONIES/mL KLEBSIELLA PNEUMONIAE Confirmed Extended Spectrum Beta-Lactamase Producer (ESBL).  In bloodstream infections from ESBL organisms, carbapenems are preferred over piperacillin/tazobactam. They are shown to have a lower risk of mortality.    Report Status 01/26/2021 FINAL  Final   Organism ID, Bacteria PSEUDOMONAS AERUGINOSA (A)  Final   Organism ID, Bacteria KLEBSIELLA PNEUMONIAE (A)  Final      Susceptibility   Klebsiella pneumoniae - MIC*    AMPICILLIN >=32 RESISTANT Resistant     CEFAZOLIN >=64 RESISTANT Resistant     CEFEPIME 2 SENSITIVE Sensitive     CEFTRIAXONE 32 RESISTANT Resistant     CIPROFLOXACIN 0.5 SENSITIVE Sensitive     GENTAMICIN <=1 SENSITIVE Sensitive     IMIPENEM <=0.25 SENSITIVE Sensitive     NITROFURANTOIN 128  RESISTANT Resistant     TRIMETH/SULFA >=320 RESISTANT Resistant     AMPICILLIN/SULBACTAM >=32 RESISTANT Resistant     PIP/TAZO >=128 RESISTANT Resistant     * 40,000 COLONIES/mL KLEBSIELLA PNEUMONIAE   Pseudomonas aeruginosa - MIC*    CEFTAZIDIME 4 SENSITIVE Sensitive     CIPROFLOXACIN <=0.25 SENSITIVE Sensitive     GENTAMICIN <=1 SENSITIVE Sensitive     IMIPENEM 2 SENSITIVE Sensitive     PIP/TAZO <=4 SENSITIVE Sensitive     CEFEPIME 2 SENSITIVE Sensitive     * >=100,000 COLONIES/mL PSEUDOMONAS AERUGINOSA  Blood Culture (routine x 2)     Status: None   Collection Time: 01/23/21  6:35 PM   Specimen: BLOOD RIGHT FOREARM  Result Value Ref Range Status   Specimen Description BLOOD RIGHT FOREARM  Final   Special Requests   Final    BOTTLES DRAWN AEROBIC AND ANAEROBIC Blood Culture adequate volume   Culture   Final    NO GROWTH 5 DAYS Performed at [facility], [address] [place], [place] [address]    Report Status 01/28/2021 FINAL  Final  Blood Culture (routine x 2)     Status: None   Collection Time: 01/23/21  6:35 PM   Specimen: BLOOD  Result Value Ref Range Status   Specimen Description BLOOD LEFT ANTECUBITAL  Final   Special Requests   Final    BOTTLES DRAWN AEROBIC AND ANAEROBIC Blood Culture adequate volume   Culture   Final    NO GROWTH 5 DAYS Performed at [facility], [address], [place], [place] [address]    Report Status 01/28/2021 FINAL  Final  Culture, blood (routine x 2)     Status: None (Preliminary result)   Collection Time: 01/25/21  9:47 PM   Specimen: Left Antecubital; Blood  Result Value Ref Range Status   Specimen Description LEFT ANTECUBITAL  Final   Special Requests   Final    Blood Culture adequate volume BOTTLES DRAWN AEROBIC ONLY   Culture   Final    NO GROWTH 4 DAYS Performed at [facility], [address] [place], [place] [address]    Report Status PENDING  Incomplete  Culture, blood (routine x 2)     Status: None (Preliminary result)    Collection Time: 01/25/21  9:54 PM   Specimen: BLOOD RIGHT HAND  Result Value Ref Range Status   Specimen Description BLOOD RIGHT HAND  Final   Special Requests   Final    Blood Culture adequate volume BOTTLES DRAWN AEROBIC ONLY   Culture   Final    NO GROWTH 4 DAYS Performed at [facility], [address], [place], [place] [address]    Report Status PENDING  Incomplete     Radiology Studies: DG CHEST PORT 1 VIEW  Result Date: 01/28/2021 CLINICAL DATA:  Fever EXAM: PORTABLE CHEST 1 VIEW COMPARISON:  January 23, 2021 FINDINGS: There is airspace opacity in the left base. Lungs elsewhere clear. Heart size and pulmonary vascularity are normal. No adenopathy. No bone lesions. IMPRESSION: Airspace opacity likely representing pneumonia left base. Lungs elsewhere clear. Heart size within normal limits. No adenopathy evident. Electronically Signed   By: Bretta Bang III M.D.   On: 01/28/2021 14:37    Scheduled Meds: . amitriptyline  10 mg Oral QHS  . baclofen  10 mg Oral Q8H  . Chlorhexidine Gluconate Cloth  6 each Topical Daily  . cholecalciferol  2,000 Units Oral Daily  . enoxaparin (LOVENOX) injection  40 mg Subcutaneous Q24H  . hyoscyamine  0.375 mg Oral Q12H  . levETIRAcetam  250 mg Oral BID  . linaclotide  145 mcg Oral QAC breakfast  . lubiprostone  8 mcg Oral BID WC  . oxybutynin  10 mg Oral Daily  . pantoprazole  40 mg Oral QHS  . pravastatin  20 mg Oral q morning  . pregabalin  75 mg Oral Daily   And  . pregabalin  150 mg Oral QHS  . saccharomyces boulardii  250 mg Oral BID   Continuous Infusions: . ceFEPime (MAXIPIME) IV 2 g (01/29/21 1718)  . lactated ringers 35 mL/hr at 01/28/21 2056     LOS: 6 days   Shon Hale, MD How to  contact the [facility] Attending or Consulting provider 7A - 7P or covering provider during after hours 7P -7A, for this patient?  1. Check the care team in [facility] and look for a) attending/consulting [facility] provider listed and b) the [facility] team  listed 2. Log into [email or web address] and use [place]'s universal password to access. If you do not have the password, please contact the hospital operator. 3. Locate the [facility] provider you are looking for under [facility] and page to a number that you can be directly reached. 4. If you still have difficulty reaching the provider, please page the [facility] (Director on Call) for the Hospitalists listed on amion for assistance.  01/29/2021, 6:29 PM

## 2021-01-29 NOTE — Progress Notes (Signed)
Physical Therapy Treatment Patient Details Name: AXTEN PASCUCCI MRN: [id number] DOB: Apr 24, 1971 Today's Date: 01/29/2021    History of Present Illness Vincent Anthony  is a 50 y.o. male, medical history of UltraPulse sclerosis, COVID-pneumonia in February, diabetes mellitus, with recent hospitalization due to sepsis from UTI, retention with Foley catheter for last 58-month, he presents from facility due to fever, progressive weakness, patient to ports he was at his usual state of health as yesterday, he is bedbound at baseline, reported this morning was feeling weak, tired, and he had fever of 104 at the facility which brought him to ED, he denies any cough, shortness of breath, abdominal pain, nausea or vomiting, he has chronic indwelling Foley.  - in ED patient had fever 100.9, white blood cell elevated at 18 K, chest x-ray with no acute findings, UA was suggestive of UTI, blood cultures, urine cultures were sent, [facility] hospitalist consulted to admit.    PT Comments    Pt willing to try sliding board transfer today.  Discussed with treating nurse (Cicily) and OK to try and place a hoyer pad under patient.  Pt required mod assist and use of UE's to transfer from supine to sitting position.  Max assist to place the sliding board and mod-max assist to transfer over to chair.  Noted fatigue from transfer.  Therapist noted some BM left on pad and informed nursing he would need to be transferred back soon to be cleaned.  Also condom cath slipped off during transfer and was informed of replacing this as well.     Follow Up Recommendations  SNF        Recommendations for Other Services OT consult     Precautions / Restrictions Precautions Precautions: Fall Restrictions Other Position/Activity Restrictions: hoyer lift in chair to use for return to bed    Mobility  Bed Mobility Overal bed mobility: Needs Assistance Bed Mobility: Supine to Sit;Sit to Supine     Supine to sit: Mod assist;Max  assist Sit to supine: Mod assist;Max assist   General bed mobility comments: labored movement, limited use of BUE due to weakness    Transfers Overall transfer level: Needs assistance Equipment used: Sliding board Transfers: Lateral/Scoot Transfers          Lateral/Scoot Transfers: Mod assist;Max assist General transfer comment: fatigues quickly; limited strength to push up.  Placed hoyer pad in chair for transfer back to bed         Cognition Arousal/Alertness: Awake/alert Behavior During Therapy: WFL for tasks assessed/performed Overall Cognitive Status: Within Functional Limits for tasks assessed                                 General Comments: Pt appears slightly confused regarding timeline, difficulty recalling SNF name but knows he is from a SNF, oriented to self and current location. Follows commands appropriately with increased time, tangential speech occasionally             Pertinent Vitals/Pain Pain Assessment: No/denies pain           PT Goals (current goals can now be found in the care plan section) Progress towards PT goals: Progressing toward goals    Frequency    Min 3X/week      PT Plan  Progress  Towards goals.       AM-PAC PT "6 Clicks" Mobility   Outcome Measure  Help needed turning from your back to your  side while in a flat bed without using bedrails?: A Lot Help needed moving from lying on your back to sitting on the side of a flat bed without using bedrails?: A Lot Help needed moving to and from a bed to a chair (including a wheelchair)?: A Lot Help needed standing up from a chair using your arms (e.g., wheelchair or bedside chair)?: Total Help needed to walk in hospital room?: Total Help needed climbing 3-5 steps with a railing? : Total 6 Click Score: 9    End of Session Equipment Utilized During Treatment: Gait belt;Other (comment) Activity Tolerance: Patient limited by fatigue Patient left: in chair;with  nursing/sitter in room Nurse Communication: Mobility status;Precautions;Need for lift equipment;Other (comment) PT Visit Diagnosis: Muscle weakness (generalized) (M62.81);Difficulty in walking, not elsewhere classified (R26.2);Other symptoms and signs involving the nervous system (R29.898)     Time: 5102-5852 PT Time Calculation (min) (ACUTE ONLY): 25 min  Charges:  $Therapeutic Activity: 23-37 mins                     Lurena Nida, PTA/CLT [phone]    Bascom Levels, Larenda Reedy B 01/29/2021, 4:48 PM

## 2021-01-30 DIAGNOSIS — N319 Neuromuscular dysfunction of bladder, unspecified: Secondary | ICD-10-CM | POA: Diagnosis not present

## 2021-01-30 DIAGNOSIS — G894 Chronic pain syndrome: Secondary | ICD-10-CM

## 2021-01-30 DIAGNOSIS — G35 Multiple sclerosis: Secondary | ICD-10-CM | POA: Diagnosis not present

## 2021-01-30 DIAGNOSIS — T83511A Infection and inflammatory reaction due to indwelling urethral catheter, initial encounter: Secondary | ICD-10-CM | POA: Diagnosis not present

## 2021-01-30 LAB — COMPREHENSIVE METABOLIC PANEL
ALT: 27 U/L (ref 0–44)
AST: 19 U/L (ref 15–41)
Albumin: 2.8 g/dL — ABNORMAL LOW (ref 3.5–5.0)
Alkaline Phosphatase: 74 U/L (ref 38–126)
Anion gap: 8 (ref 5–15)
BUN: 9 mg/dL (ref 6–20)
CO2: 26 mmol/L (ref 22–32)
Calcium: 8.9 mg/dL (ref 8.9–10.3)
Chloride: 105 mmol/L (ref 98–111)
Creatinine, Ser: 0.88 mg/dL (ref 0.61–1.24)
GFR, Estimated: >60 mL/min (ref >60)
Glucose, Bld: 98 mg/dL (ref 70–99)
Potassium: 3.8 mmol/L (ref 3.5–5.1)
Sodium: 139 mmol/L (ref 135–145)
Total Bilirubin: 0.6 mg/dL (ref 0.3–1.2)
Total Protein: 6 g/dL — ABNORMAL LOW (ref 6.5–8.1)

## 2021-01-30 LAB — CBC
HCT: 29.3 % — ABNORMAL LOW (ref 39.0–52.0)
Hemoglobin: 9.2 g/dL — ABNORMAL LOW (ref 13.0–17.0)
MCH: 28 pg (ref 26.0–34.0)
MCHC: 31.4 g/dL (ref 30.0–36.0)
MCV: 89.1 fL (ref 80.0–100.0)
Platelets: 486 10*3/uL — ABNORMAL HIGH (ref 150–400)
RBC: 3.29 MIL/uL — ABNORMAL LOW (ref 4.22–5.81)
RDW: 17.5 % — ABNORMAL HIGH (ref 11.5–15.5)
WBC: 15 10*3/uL — ABNORMAL HIGH (ref 4.0–10.5)
nRBC: 0 % (ref 0.0–0.2)

## 2021-01-30 MED ORDER — LEVOFLOXACIN 500 MG PO TABS: 500.0000 mg | ORAL_TABLET | Freq: Every day | ORAL | 0 refills | Status: AC

## 2021-01-30 NOTE — Discharge Instructions (Signed)
1)Take levofloxacin antibiotic 500 mg daily for 3 days as prescribed for pneumonia and urinary tract infection 2) repeat CBC and BMP blood test with PCP on Monday, 02/03/2021

## 2021-01-30 NOTE — Care Management Important Message (Signed)
Important Message  Patient Details  Name: Vincent Anthony MRN: [id number] Date of Birth: 1971-05-08   Medicare Important Message Given:  Yes Carollee Herter, RN will deliver due to contact precautions)     [name] 01/30/2021, 1:49 PM

## 2021-01-30 NOTE — Discharge Summary (Addendum)
Vincent Anthony, is a 50 y.o. male  DOB [birth year]  MRN [id number].  Admission date:  01/23/2021  Admitting Physician  Starleen Armsawood S Elgergawy, MD  Discharge Date:  01/30/2021   Primary MD  Smith RobertKikel, Stephen, MD  Recommendations for primary care physician for things to follow:   1)Take levofloxacin antibiotic 500 mg daily for 3 days as prescribed for pneumonia and urinary tract infection 2) repeat CBC and BMP blood test with PCP on Monday, 02/03/2021   Admission Diagnosis  UTI (urinary tract infection) [N39.0] Acute cystitis with hematuria [N30.01]   Discharge Diagnosis  UTI (urinary tract infection) [N39.0] Acute cystitis with hematuria [N30.01]    Active Problems:   MS (multiple sclerosis) (HCC)   Absent testis, acquired   Chronic pain syndrome   Neurogenic bladder   Immunosuppressed status (HCC)   UTI (urinary tract infection)      Past Medical History:  Diagnosis Date  . Arthritis   . Elevated LFTs   . Elevated liver enzymes   . GERD (gastroesophageal reflux disease)   . Headache   . MS (multiple sclerosis) (HCC)   . Neurogenic bladder   . Tremor     Past Surgical History:  Procedure Laterality Date  . COLONOSCOPY     2-4 years ago  . HERNIA REPAIR    . KNEE SURGERY    . TESTICLE REMOVAL       HPI  from the history and physical done on the day of admission:    Vincent Anthony  is a 50 y.o. male, medical history of UltraPulse sclerosis, COVID-pneumonia in February, diabetes mellitus, with recent hospitalization due to sepsis from UTI, retention with Foley catheter for last 5474-month, he presents from facility due to fever, progressive weakness, patient to ports he was at his usual state of health as yesterday, he is bedbound at baseline, reported this morning was feeling weak, tired, and he had fever of 104 at the facility which brought him to ED, he denies any cough, shortness of breath,  abdominal pain, nausea or vomiting, he has chronic indwelling Foley. - in ED patient had fever 100.9, white blood cell elevated at 18 K, chest x-ray with no acute findings, UA was suggestive of UTI, blood cultures, urine cultures were sent, [facility] consulted to admit.       Hospital Course:     Brief Admission History:  49 y.o.male,medical history of multiple sclerosis, COVID-pneumonia in February, diabetes mellitus, with recent hospitalization due to sepsis from UTI, retention with Foley catheter for last 374-month, he presents from facility due to fever, progressive weakness, patient to ports he was at his usual state of health as yesterday, he is bedbound at baseline, reported this morning was feeling weak, tired, and he had fever of 104 at the facility which brought him to ED, he denies any cough, shortness of breath, abdominal pain, nausea or vomiting, he has chronic indwelling Foley.  In EDpatient had fever 100.9, white blood cell elevated at 18 K, chest x-ray with no acute  findings, UA was suggestive of UTI, blood cultures, urine cultures were sent, [facility] hospitalist consulted to admit.  Assessment & Plan:   Active Problems:   MS (multiple sclerosis) (HCC)   Absent testis, acquired   Chronic pain syndrome   Neurogenic bladder   Immunosuppressed status (HCC)   UTI (urinary tract infection)  Pseudomonas and MDR Klebsiella Catheter associated UTI - POA- ---patient came from SNF with indwelling foley which has Now been removed.  With culture results and sensitivities started on cefepime.  DCed vanc and ceftriaxone.   -Completed 5 days of IV cefepime for Pseudomonas and Klebsiella complicated UTI/CAUTI (patient came to the hospital with an indwelling Foley catheter which has now been removed)   Pt has been voiding in condom cath since foley was removed-post void residual showed a 75 ml -Leukocytosis persist-repeat CBC with PCP on 02/03/2021 -Afebrile and clinically  significantly improved  -Left-sided pneumonia--cannot rule out aspiration component,   chest x-ray from 01/28/2021 consistent with pneumonia -Treated with cefepime as above -Okay to discharge on Levaquin for additional 3 days -Persistent leukocytosis noted repeat CBC on 02/03/2021 -Much improved, no hypoxia no dyspnea  MS associated neurogenic bladder - Foley was placed 2 months back at SNF due to incontinence, Urologist Dr Mena Goes was contacted during admission and recommended trial of removing foley MS with voiding trial. -  Pt has been voiding in condom cath since foley was removed-post void residual showed a 75 ml -No need for Foley catheter at this time -Consider outpatient follow-up with urology if urinary problems reoccur - Chronic pain - He has been on lyrica and baclofen which is continued.   Hyperlipidemia - resumed home statin therapy.   Constipation/neurogenic bowel-continue laxatives  GAD - remains on home alprazolam.   Code Status: full  Family Communication: wife updated by telephone  Disposition: return SNF    Dispo:             Patient From: Skilled Nursing Facility             Planned Disposition: Skilled Nursing Facility   Consultants:   Dr. Mena Goes urology  Procedures:   Foley catheter removal  Antimicrobials:  Vancomycin 4/28>>5/1 ceftrixaone 4/28>>5/1 Cefepime 5/1>> Azithro 01/29/21  Discharge Condition: Stable  Follow UP   Contact information for after-discharge care    Destination    [facility] .   Service: Skilled Nursing Contact information: [address] [place] [address] [phone]                  Diet and Activity recommendation:  As advised  Discharge Instructions    Discharge Instructions    Call MD for:  difficulty breathing, headache or visual disturbances   Complete by: As directed    Call MD for:  persistant dizziness or light-headedness   Complete by:  As directed    Call MD for:  persistant nausea and vomiting   Complete by: As directed    Call MD for:  temperature >100.4   Complete by: As directed    Diet - low sodium heart healthy   Complete by: As directed    Discharge instructions   Complete by: As directed    1)Take levofloxacin antibiotic 500 mg daily for 3 days as prescribed for pneumonia and urinary tract infection 2) repeat CBC and BMP blood test with PCP on Monday, 02/03/2021   Increase activity slowly   Complete by: As directed  Discharge Medications     Allergies as of 01/30/2021      Reactions   Celebrex [celecoxib] Nausea And Vomiting   Clindamycin Diarrhea   Vioxx [rofecoxib] Nausea And Vomiting   Zanaflex [tizanidine Hcl] Nausea And Vomiting      Medication List    STOP taking these medications   lubiprostone 8 MCG capsule Commonly known as: Amitiza   potassium chloride SA 20 MEQ tablet Commonly known as: KLOR-CON     TAKE these medications   amitriptyline 10 MG tablet Commonly known as: ELAVIL Take 1 tablet (10 mg total) by mouth at bedtime. Hold until linezolid course has finished   artificial tears Oint ophthalmic ointment Commonly known as: LACRILUBE Place into both eyes every 4 (four) hours as needed for dry eyes.   baclofen 10 MG tablet Commonly known as: LIORESAL Take 1 tablet by mouth 3 (three) times daily.   cetirizine 10 MG tablet Commonly known as: ZYRTEC Take 10 mg by mouth daily.   hyoscyamine 0.375 MG 12 hr tablet Commonly known as: LEVBID TAKE (1) TABLET BYMOUTH EVERY TWELVE HOURS. What changed:   how much to take  how to take this  when to take this  reasons to take this  additional instructions   levETIRAcetam 250 MG tablet Commonly known as: KEPPRA Take 1 tablet by mouth 2 (two) times daily.   levofloxacin 500 MG tablet Commonly known as: Levaquin Take 1 tablet (500 mg total) by mouth daily for 3 days.   linaclotide 145 MCG Caps capsule Commonly  known as: LINZESS Take 1 capsule (145 mcg total) by mouth daily before breakfast. Skip a dose if diarrhoea (loose stools)>2/times a day   metFORMIN 500 MG tablet Commonly known as: GLUCOPHAGE Take 500 mg by mouth 2 (two) times daily.   OCREVUS IV Inject into the vein. Every 6 months for MS   ondansetron 4 MG tablet Commonly known as: Zofran Take 1 tablet (4 mg total) by mouth every 8 (eight) hours as needed for nausea or vomiting.   oxybutynin 10 MG 24 hr tablet Commonly known as: DITROPAN-XL Take 10 mg by mouth daily.   pantoprazole 40 MG tablet Commonly known as: PROTONIX Take 1 tablet (40 mg total) by mouth at bedtime.   pravastatin 20 MG tablet Commonly known as: PRAVACHOL Take 20 mg by mouth every morning.   pregabalin 75 MG capsule Commonly known as: LYRICA Take 75 mg by mouth See admin instructions. 1 capsule in morning and 2 capsules at bedtime   senna 8.6 MG tablet Commonly known as: SENOKOT Take 2 tablets (17.2 mg total) by mouth 2 (two) times daily. Hold for loose stools >2/day   simethicone 40 MG/0.6ML drops Commonly known as: MYLICON Take 0.6 mLs (40 mg total) by mouth 4 (four) times daily for 10 days.   Vitamin D3 50 MCG (2000 UT) Tabs Take 2,000 Units by mouth in the morning.       Major procedures and Radiology Reports - PLEASE review detailed and final reports for all details, in brief -   DG CHEST PORT 1 VIEW  Result Date: 01/28/2021 CLINICAL DATA:  Fever EXAM: PORTABLE CHEST 1 VIEW COMPARISON:  January 23, 2021 FINDINGS: There is airspace opacity in the left base. Lungs elsewhere clear. Heart size and pulmonary vascularity are normal. No adenopathy. No bone lesions. IMPRESSION: Airspace opacity likely representing pneumonia left base. Lungs elsewhere clear. Heart size within normal limits. No adenopathy evident. Electronically Signed   By: Bretta Bang III  M.D.   On: 01/28/2021 14:37   DG Chest Port 1 View  Result Date: 01/23/2021 CLINICAL  DATA:  Questionable sepsis, fever, negative COVID. EXAM: PORTABLE CHEST 1 VIEW COMPARISON:  01/02/2021 FINDINGS: Shallow inspiration. Heart size and pulmonary vascularity are normal for technique. No airspace disease or consolidation in the lungs. No pleural effusions. No pneumothorax. Mediastinal contours appear intact. Thoracic scoliosis convex towards the right. IMPRESSION: No active disease. Electronically Signed   By: Burman Nieves M.D.   On: 01/23/2021 19:05   DG CHEST PORT 1 VIEW  Result Date: 01/02/2021 CLINICAL DATA:  Bacteremia. EXAM: PORTABLE CHEST 1 VIEW COMPARISON:  12/31/2020. FINDINGS: Right IJ line in stable position. Stable cardiomegaly. Interim improvement of bilateral pulmonary infiltrates/edema with mild residual. Findings suggest improvement of CHF. Improved small left pleural effusion. No pneumothorax. IMPRESSION: 1.  Right IJ line in stable position. 2. Stable cardiomegaly. Interim improvement of bilateral pulmonary infiltrates/edema and left-sided pleural effusion. Mild residual. Findings suggest improving CHF. Electronically Signed   By: Maisie Fus  Register   On: 01/02/2021 05:23    Micro Results   Recent Results (from the past 240 hour(s))  Resp Panel by RT-PCR (Flu A&B, Covid) Nasopharyngeal Swab     Status: None   Collection Time: 01/23/21  6:22 PM   Specimen: Nasopharyngeal Swab; Nasopharyngeal(NP) swabs in vial transport medium  Result Value Ref Range Status   SARS Coronavirus 2 by RT PCR NEGATIVE NEGATIVE Final    Comment: (NOTE) SARS-CoV-2 target nucleic acids are NOT DETECTED.  The SARS-CoV-2 RNA is generally detectable in upper respiratory specimens during the acute phase of infection. The lowest concentration of SARS-CoV-2 viral copies this assay can detect is 138 copies/mL. A negative result does not preclude SARS-Cov-2 infection and should not be used as the sole basis for treatment or other patient management decisions. A negative result may occur with   improper specimen collection/handling, submission of specimen other than nasopharyngeal swab, presence of viral mutation(s) within the areas targeted by this assay, and inadequate number of viral copies(<138 copies/mL). A negative result must be combined with clinical observations, patient history, and epidemiological information. The expected result is Negative.  Fact Sheet for Patients:  BloggerCourse.com  Fact Sheet for Healthcare Providers:  SeriousBroker.it  This test is no t yet approved or cleared by the [place] FDA and  has been authorized for detection and/or diagnosis of SARS-CoV-2 by FDA under an Emergency Use Authorization (EUA). This EUA will remain  in effect (meaning this test can be used) for the duration of the COVID-19 declaration under Section 564(b)(1) of the Act, 21 U.S.C.section 360bbb-3(b)(1), unless the authorization is terminated  or revoked sooner.       Influenza A by PCR NEGATIVE NEGATIVE Final   Influenza B by PCR NEGATIVE NEGATIVE Final    Comment: (NOTE) The Xpert Xpress SARS-CoV-2/FLU/RSV plus assay is intended as an aid in the diagnosis of influenza from Nasopharyngeal swab specimens and should not be used as a sole basis for treatment. Nasal washings and aspirates are unacceptable for Xpert Xpress SARS-CoV-2/FLU/RSV testing.  Fact Sheet for Patients: BloggerCourse.com  Fact Sheet for Healthcare Providers: SeriousBroker.it  This test is not yet approved or cleared by the [place] FDA and has been authorized for detection and/or diagnosis of SARS-CoV-2 by FDA under an Emergency Use Authorization (EUA). This EUA will remain in effect (meaning this test can be used) for the duration of the COVID-19 declaration under Section 564(b)(1) of the Act, 21 U.S.C. section 360bbb-3(b)(1), unless  the authorization is terminated  or revoked.  Performed at [facility], [address], [place], [place] [address]   Urine culture     Status: Abnormal   Collection Time: 01/23/21  6:25 PM   Specimen: In/Out Cath Urine  Result Value Ref Range Status   Specimen Description   Final    IN/OUT CATH URINE Performed at [facility], [address], [place], [place] [address]    Special Requests   Final    NONE Performed at [facility], [address] [place], [place] [address]    Culture (A)  Final    >=100,000 COLONIES/mL PSEUDOMONAS AERUGINOSA 40,000 COLONIES/mL KLEBSIELLA PNEUMONIAE Confirmed Extended Spectrum Beta-Lactamase Producer (ESBL).  In bloodstream infections from ESBL organisms, carbapenems are preferred over piperacillin/tazobactam. They are shown to have a lower risk of mortality.    Report Status 01/26/2021 FINAL  Final   Organism ID, Bacteria PSEUDOMONAS AERUGINOSA (A)  Final   Organism ID, Bacteria KLEBSIELLA PNEUMONIAE (A)  Final      Susceptibility   Klebsiella pneumoniae - MIC*    AMPICILLIN >=32 RESISTANT Resistant     CEFAZOLIN >=64 RESISTANT Resistant     CEFEPIME 2 SENSITIVE Sensitive     CEFTRIAXONE 32 RESISTANT Resistant     CIPROFLOXACIN 0.5 SENSITIVE Sensitive     GENTAMICIN <=1 SENSITIVE Sensitive     IMIPENEM <=0.25 SENSITIVE Sensitive     NITROFURANTOIN 128 RESISTANT Resistant     TRIMETH/SULFA >=320 RESISTANT Resistant     AMPICILLIN/SULBACTAM >=32 RESISTANT Resistant     PIP/TAZO >=128 RESISTANT Resistant     * 40,000 COLONIES/mL KLEBSIELLA PNEUMONIAE   Pseudomonas aeruginosa - MIC*    CEFTAZIDIME 4 SENSITIVE Sensitive     CIPROFLOXACIN <=0.25 SENSITIVE Sensitive     GENTAMICIN <=1 SENSITIVE Sensitive     IMIPENEM 2 SENSITIVE Sensitive     PIP/TAZO <=4 SENSITIVE Sensitive     CEFEPIME 2 SENSITIVE Sensitive     * >=100,000 COLONIES/mL PSEUDOMONAS AERUGINOSA  Blood Culture (routine x 2)     Status: None   Collection Time: 01/23/21  6:35 PM   Specimen: BLOOD RIGHT FOREARM   Result Value Ref Range Status   Specimen Description BLOOD RIGHT FOREARM  Final   Special Requests   Final    BOTTLES DRAWN AEROBIC AND ANAEROBIC Blood Culture adequate volume   Culture   Final    NO GROWTH 5 DAYS Performed at [facility], [address] [place], [place] [address]    Report Status 01/28/2021 FINAL  Final  Blood Culture (routine x 2)     Status: None   Collection Time: 01/23/21  6:35 PM   Specimen: BLOOD  Result Value Ref Range Status   Specimen Description BLOOD LEFT ANTECUBITAL  Final   Special Requests   Final    BOTTLES DRAWN AEROBIC AND ANAEROBIC Blood Culture adequate volume   Culture   Final    NO GROWTH 5 DAYS Performed at [facility], [address], [place], [place] [address]    Report Status 01/28/2021 FINAL  Final  Culture, blood (routine x 2)     Status: None (Preliminary result)   Collection Time: 01/25/21  9:47 PM   Specimen: Left Antecubital; Blood  Result Value Ref Range Status   Specimen Description LEFT ANTECUBITAL  Final   Special Requests   Final    Blood Culture adequate volume BOTTLES DRAWN AEROBIC ONLY   Culture   Final    NO GROWTH 4 DAYS Performed  at [facility], [address] [place], [place] [address]    Report Status PENDING  Incomplete  Culture, blood (routine x 2)     Status: None (Preliminary result)   Collection Time: 01/25/21  9:54 PM   Specimen: BLOOD RIGHT HAND  Result Value Ref Range Status   Specimen Description BLOOD RIGHT HAND  Final   Special Requests   Final    Blood Culture adequate volume BOTTLES DRAWN AEROBIC ONLY   Culture   Final    NO GROWTH 4 DAYS Performed at [facility], [address], [place], [place] [address]    Report Status PENDING  Incomplete   Today   Subjective    Yaniel Limbaugh today has no new complaints -Eating and drinking well, voiding well          Patient has been seen and examined prior to discharge   Objective   Blood pressure 105/67, pulse 84, temperature 98.3 F (36.8  C), temperature source Oral, resp. rate 16, height 6\' 3"  (1.905 m), weight 92.1 kg, SpO2 97 %.   Intake/Output Summary (Last 24 hours) at 01/30/2021 1431 Last data filed at 01/30/2021 1413 Gross per 24 hour  Intake 720 ml  Output 3925 ml  Net -3205 ml    Exam Gen:- Awake Alert, no acute distress  HEENT:- [place].AT, No sclera icterus Neck-Supple Neck,No JVD,.  Lungs-  CTAB , good air movement bilaterally  CV- S1, S2 normal, regular Abd-  +ve B.Sounds, Abd Soft, No tenderness,    Extremity/Skin:- No  edema,   good pulses, bilateral heel protectors Psych-affect is appropriate, oriented x3 Neuro-chronic MS related neuromuscular deficits without additional new focal deficits   Data Review   CBC w Diff:  Lab Results  Component Value Date   WBC 15.0 (H) 01/30/2021   HGB 9.2 (L) 01/30/2021   HCT 29.3 (L) 01/30/2021   PLT 486 (H) 01/30/2021   LYMPHOPCT 4 01/29/2021   MONOPCT 7 01/29/2021   EOSPCT 1 01/29/2021   BASOPCT 0 01/29/2021    CMP:  Lab Results  Component Value Date   NA 139 01/30/2021   K 3.8 01/30/2021   CL 105 01/30/2021   CO2 26 01/30/2021   BUN 9 01/30/2021   CREATININE 0.88 01/30/2021   CREATININE 0.99 11/16/2016   PROT 6.0 (L) 01/30/2021   ALBUMIN 2.8 (L) 01/30/2021   BILITOT 0.6 01/30/2021   ALKPHOS 74 01/30/2021   AST 19 01/30/2021   ALT 27 01/30/2021  .   Total Discharge time is about 33 minutes  04/01/2021 M.D on 01/30/2021 at 2:31 PM  Go to [email or web address] -  for contact info  [facility] - Office  [phone]

## 2021-01-30 NOTE — TOC Transition Note (Signed)
Transition of Care [facility]) - CM/SW Discharge Note   Patient Details  Name: Vincent Anthony MRN: [id number] Date of Birth: Jun 18, 1971  [facility] of [facility] CM/SW Contact:  Annice Needy, LCSW Phone Number: 01/30/2021, 3:06 PM   Clinical Narrative:    Discharge clinicals sent to facility. Patient will go to room B18-2. [facility] transport contacted. RN to call report. Spouse notified. TOC signing off.     Final next level of care: Skilled Nursing Facility Barriers to Discharge: No Barriers Identified   Patient Goals and CMS Choice Patient states their goals for this hospitalization and ongoing recovery are:: return to LTC      Discharge Placement              Patient chooses bed at: Other - please specify in the comment section below: ([facility]) Patient to be transferred to facility by: [facility] Name of family member notified: spouse Patient and family notified of of transfer: 01/30/21  Discharge Plan and Services                                     Social Determinants of Health (SDOH) Interventions     Readmission Risk Interventions Readmission Risk Prevention Plan 01/30/2021  Transportation Screening Complete  PCP or Specialist Appt within 3-5 Days Complete  HRI or Home Care Consult Complete  Social Work Consult for Recovery Care Planning/Counseling Complete  Palliative Care Screening Not Applicable  Medication Review [employer]) Complete  Some recent data might be hidden

## 2021-01-30 NOTE — Progress Notes (Addendum)
Occupational Therapy Treatment Patient Details Name: Vincent Anthony MRN: [id number] DOB: 1970-10-16 Today's Date: 01/30/2021    History of present illness Vincent Anthony  is a 50 y.o. male, medical history of UltraPulse sclerosis, COVID-pneumonia in February, diabetes mellitus, with recent hospitalization due to sepsis from UTI, retention with Foley catheter for last 31-month, he presents from facility due to fever, progressive weakness, patient to ports he was at his usual state of health as yesterday, he is bedbound at baseline, reported this morning was feeling weak, tired, and he had fever of 104 at the facility which brought him to ED, he denies any cough, shortness of breath, abdominal pain, nausea or vomiting, he has chronic indwelling Foley.  - in ED patient had fever 100.9, white blood cell elevated at 18 K, chest x-ray with no acute findings, UA was suggestive of UTI, blood cultures, urine cultures were sent, [facility] hospitalist consulted to admit.   OT comments  Pt agreeable to OT treatment this date. Pt continues to require mod to max A for supine to sit and sit to supine bed mobility. Pt able to sit at EOB for ~5 to 10 minutes prior to reporting need for bowel movement. Min A required for L side roll in supine. Pt tolerated P/ROM in supine for R shoulder flexion, abduction, and elbow flexion and abduction. Min A required for supine rolling to R side using bed rail and lateral shuffle to L side seated upright at EOB. Bedpan placed during session due to pt report of need for bowel movement. Pt will continue to benefit from acute OT services to improve deficit areas prior to discharge.   Follow Up Recommendations  SNF    Equipment Recommendations  None recommended by OT          Precautions / Restrictions Precautions Precautions: Fall Restrictions Weight Bearing Restrictions: No       Mobility Bed Mobility Overal bed mobility: Needs Assistance Bed Mobility: Supine to Sit;Sit  to Supine;Rolling Rolling: Min assist   Supine to sit: Mod assist;Max assist Sit to supine: Mod assist;Max assist   General bed mobility comments: labored movement, limited use of R UE due to weakness. Min A for lateral scooting to L side.    Transfers                      Balance Overall balance assessment: Needs assistance Sitting-balance support: Feet supported (Using L UE to suppurt via grabbing his L knee.) Sitting balance-Leahy Scale: Fair Sitting balance - Comments: fair/good supporting self with BUE (Able to sit at EOB for ~5 to 10 minutes prior to requesting assist for a bowel movement.)                                   ADL either performed or assessed with clinical judgement   ADL Overall ADL's : Needs assistance/impaired                         Toilet Transfer: Maximal assistance Toilet Transfer Details (indicate cue type and reason): Pt completed supine Rolling to R side with this therapist placing bed pan under gluteal region.                                   Cognition Arousal/Alertness: Awake/alert Behavior During Therapy: Vincent Anthony  for tasks assessed/performed Overall Cognitive Status: Within Functional Limits for tasks assessed                                          Exercises Exercises: General Upper Extremity General Exercises - Upper Extremity Shoulder Flexion: PROM;10 reps;Supine;R Shoulder ABduction: PROM;10 reps;Supine;R Elbow Flexion: PROM;10 reps;Supine;R Elbow Extension: PROM;10 reps;Supine;R Other Exercises Other Exercises: Pt sat up at EOB and was prmopted to engage in sitting balance activities but reported the need to have a bowel movement. Pt assist back to supine with bedpan placed.                Pertinent Vitals/ Pain       Pain Assessment: No/denies pain                                                          Frequency  Min 2X/week         Progress Toward Goals  OT Goals(current goals can now be found in the care plan section)  Progress towards OT goals: Progressing toward goals  Acute Rehab OT Goals Patient Stated Goal: return to SNF for rehab OT Goal Formulation: With patient Time For Goal Achievement: 02/07/21 Potential to Achieve Goals: Fair ADL Goals Pt Will Perform Grooming: with modified independence;sitting;with adaptive equipment Pt Will Perform Upper Body Bathing: with set-up;sitting Pt Will Perform Upper Body Dressing: with modified independence;sitting;with adaptive equipment Pt Will Perform Lower Body Dressing: with max assist;bed level;with adaptive equipment;sitting/lateral leans;with mod assist Pt Will Transfer to Toilet: with transfer board;bedside commode;with max assist;with +2 assist;with total assist Pt/caregiver will Perform Home Exercise Program: Increased ROM;Left upper extremity;With minimal assist  Plan Discharge plan remains appropriate                                         End of Session    OT Visit Diagnosis: Other abnormalities of gait and mobility (R26.89);Muscle weakness (generalized) (M62.81);Other symptoms and signs involving the nervous system (R29.898)   Activity Tolerance Patient tolerated treatment well   Patient Left in bed;with call bell/phone within reach   Nurse Communication Other (comment) (NT staff notified of pt being on pedpan. Staff instructed pt to hit call bell when done with bowel movement. NT staff also notified of IV machine frequently beeping which was frustrating the pt.)        Time: 3893-7342 OT Time Calculation (min): 31 min  Charges: OT General Charges $OT Visit: 1 Visit OT Treatments $Self Care/Home Management : 8-22 mins $Therapeutic Exercise: 8-22 mins  Vincent Anthony OT, MOT    Vincent Anthony 01/30/2021, 10:15 AM

## 2021-01-31 LAB — CULTURE, BLOOD (ROUTINE X 2)
Culture: NO GROWTH
Culture: NO GROWTH
Special Requests: ADEQUATE
Special Requests: ADEQUATE

## 2021-04-04 ENCOUNTER — Ambulatory Visit: Payer: Medicare HMO | Admitting: Urology

## 2021-04-11 ENCOUNTER — Ambulatory Visit: Payer: Medicare HMO | Admitting: Urology

## 2022-03-01 ENCOUNTER — Encounter: Payer: Self-pay | Admitting: Gastroenterology

## 2022-03-01 NOTE — Progress Notes (Unsigned)
GI Office Note    Referring Provider: Vidal Schwalbe, MD Primary Care Physician:  Vidal Schwalbe, MD Primary GI: Dr. Gala Romney  Date:  03/02/2022  ID:  Vincent Anthony, DOB [birth year], MRN [id number]   Chief Complaint   Chief Complaint  Patient presents with   Follow-up    Consult for colonoscopy     History of Present Illness  Vincent Anthony is a 51 y.o. male presenting today for follow up. He was originally seen for consult of elevated LFTs and generalized abdominal pain. He was previously on Gilenya for his MS which is known to potentially increase LFTs by 10-15%. PCP felt like his elevation was secondary to medication effect. Prior workup negative for viral etiology, ANA mildly elevated with titer 1:80 but not surprisingly given he has MS. Other autoimmune labs negative. RUQ U/S most consistent with fatty liver.  Prior to consultation with [place] he was seen by a GI at [facility] and was being treated with hycosamine for abdominal cramping. He had began taking his Gilenya every other day and continued to have an elevation in LFTs with normal bilirubin. Medication adjustments deferred to neurology. He had multiple checks of his LFTs revealing some normalization of LFTs and then another bump including increase in Alk Phos. His last in person office visit was 01/05/17 and was no show for appointment in July 2018.  Last seen via video visit 07/10/20 for follow up and medication refills. At this visit he reported that he was doing fairly well in regards to his LFTs and MS. He was on Ocrevus IV. His abdominal pain and colon spasms were controlled on hycosamine BID. He denied any other GI complaints or URI symptoms. He had recently been following with ortho due to knee and ankle sprains post fall x2.   Last colonoscopy 08/22/2013 with [facility]). Normal.  Last LFTs in system from 01/30/21 - AST 19, ALT 27, Alk Phos 74, T Bili 0.6. No recent labs in [place].    Today:   Change in bowel habits - No.  Melena/ BRBPR - No Constipation - has been on Linzess 145 mcg daily and senokot, having a bowel movement about every other day, depends on food that he is eating.  Diarrhea - one time with too much senna Abdominal pain - controlled with Levbid, intermittent generalized pain. No weight loss, early satiety, lack of appetite Reflux - on pantoprazole 40 mg nightly, doing well. Denies dysphagia.  States PCP stated he needed another colonoscopy. Last colonoscopy due to lower abdominal pain and constipation.   Only seeing neurology about once a year. Doing physical therapy and occupational therapy at the facility, walking short distances. Was having hallucinations with urinary catheter in. No longer taking Ocrevus, taking baclofen, klonopin. Has a great support system with family and friends.   Past Medical History:  Diagnosis Date   Arthritis    Elevated LFTs    Elevated liver enzymes    GERD (gastroesophageal reflux disease)    Headache    MS (multiple sclerosis) (HCC)    Neurogenic bladder    Tremor     Past Surgical History:  Procedure Laterality Date   COLONOSCOPY  2014   HERNIA REPAIR     KNEE SURGERY     TESTICLE REMOVAL      Current Outpatient Medications  Medication Sig Dispense Refill   amitriptyline (ELAVIL) 10 MG tablet Take 1 tablet (10 mg total) by mouth at bedtime. Hold until linezolid course has  finished     artificial tears (LACRILUBE) OINT ophthalmic ointment Place into both eyes every 4 (four) hours as needed for dry eyes.     baclofen (LIORESAL) 10 MG tablet Take 1 tablet by mouth 3 (three) times daily.     cetirizine (ZYRTEC) 10 MG tablet Take 10 mg by mouth daily.     Cholecalciferol (VITAMIN D3) 50 MCG (2000 UT) TABS Take 2,000 Units by mouth in the morning.     clonazePAM (KLONOPIN) 1 MG tablet Take 1 mg by mouth 2 (two) times daily.     hyoscyamine (LEVBID) 0.375 MG 12 hr tablet TAKE (1) TABLET BYMOUTH EVERY TWELVE HOURS.  (Patient taking differently: Take 0.375 mg by mouth every 12 (twelve) hours as needed for cramping.) 60 tablet 11   levETIRAcetam (KEPPRA) 250 MG tablet Take 1 tablet by mouth 2 (two) times daily.     linaclotide (LINZESS) 145 MCG CAPS capsule Take 1 capsule (145 mcg total) by mouth daily before breakfast. Skip a dose if diarrhoea (loose stools)>2/times a day 30 capsule    metFORMIN (GLUCOPHAGE) 500 MG tablet Take 500 mg by mouth 2 (two) times daily.     Ocrelizumab (OCREVUS IV) Inject into the vein. Every 6 months for MS     ondansetron (ZOFRAN) 4 MG tablet Take 4 mg by mouth every 8 (eight) hours as needed for nausea or vomiting.     oxybutynin (DITROPAN-XL) 10 MG 24 hr tablet Take 10 mg by mouth daily.     pantoprazole (PROTONIX) 40 MG tablet Take 1 tablet (40 mg total) by mouth at bedtime. 30 tablet    pravastatin (PRAVACHOL) 20 MG tablet Take 20 mg by mouth every morning.      pregabalin (LYRICA) 75 MG capsule Take 75 mg by mouth See admin instructions. 1 capsule in morning and 2 capsules at bedtime     senna (SENOKOT) 8.6 MG tablet Take 2 tablets (17.2 mg total) by mouth 2 (two) times daily. Hold for loose stools >2/day     simethicone (MYLICON) 40 HF/0.2OV drops Take 0.6 mLs (40 mg total) by mouth 4 (four) times daily for 10 days.  0   No current facility-administered medications for this visit.    Allergies as of 03/02/2022 - Review Complete 03/02/2022  Allergen Reaction Noted   Celebrex [celecoxib] Nausea And Vomiting 03/04/2013   Clindamycin Diarrhea 09/14/2012   Vioxx [rofecoxib] Nausea And Vomiting 03/04/2013   Zanaflex [tizanidine hcl] Nausea And Vomiting 11/30/2014    Family History  Problem Relation Age of Onset   Heart attack Mother    Diabetes Father    Hypertension Father    Colon cancer Neg Hx     Social History   Socioeconomic History   Marital status: Married    Spouse name: Not on file   Number of children: Not on file   Years of education: Not on file    Highest education level: Not on file  Occupational History   Not on file  Tobacco Use   Smoking status: Never   Smokeless tobacco: Never  Substance and Sexual Activity   Alcohol use: No    Comment: occasionally   Drug use: No   Sexual activity: Not on file  Other Topics Concern   Not on file  Social History Narrative   Not on file   Social Determinants of Health   Financial Resource Strain: Not on file  Food Insecurity: Not on file  Transportation Needs: Not on file  Physical Activity:  Not on file  Stress: Not on file  Social Connections: Not on file     Review of Systems   Gen: + muscle weakness. Denies fever, chills, anorexia. Denies fatigue, weight loss.  CV: Denies chest pain, palpitations, syncope, peripheral edema, and claudication. Resp: Denies dyspnea at rest, cough, wheezing, coughing up blood, and pleurisy. GI: see HPI Derm: Denies rash, itching, dry skin Psych: Denies depression, anxiety, memory loss, confusion. No homicidal or suicidal ideation.  Heme: Denies bruising, bleeding, and enlarged lymph nodes.   Physical Exam   BP 108/72   Pulse 84   Temp 98.2 F (36.8 C)   Ht _0  (1.905 m)   Wt 235 lb (106.6 kg)   BMI 29.37 kg/m   General:   Alert and oriented. No distress noted. Pleasant and cooperative.  Head:  Normocephalic and atraumatic. Eyes:  Conjuctiva clear without scleral icterus. Lungs:  Clear to auscultation bilaterally. No wheezes, rales, or rhonchi. No distress.  Heart:  S1, S2 present without murmurs appreciated.  Abdomen:  +BS, soft, non-tender and non-distended. Small soft, reducible umbilical hernia.  Rectal: deferred Msk: Normal posture. In wheelchair, leg brace to RLE. Weakness to right side.  Extremities:  Without edema. Neurologic:  Alert and  oriented x4 Psych:  Alert and cooperative. Normal mood and affect.   Assessment  Vincent Anthony is a 51 y.o. male with a history of Elevated LFTs secondary to medication effect,  GERD, abdominal pain, multiple sclerosis, and neurogenic bladder presenting today for follow up and need for screening colonoscopy.  Abdominal pain: Generalized. Controlled with Levbid, continue BID as needed.   Constipation: Has bowel movement every other day, usually soft in nature.  Is currently on Linzess 145 mcg daily and senna 2 tablets once or twice daily.  Reports that sometimes when he takes senna twice daily he has some diarrhea.  Advised to continue current regimen, will briefly increase Linzess to 290 mcg 4 days prior to colonoscopy.  GERD: Denies dysphagia or any other alarm symptoms.  Currently controlled on pantoprazole 40 mg nightly.  Screening for colon cancer: Had colonoscopy in 2014 due to abdominal pain and constipation.  Per chart review and patient it was normal.  No alarm symptoms present.  No family history of colon cancer or colon polyps.  Recommended that he get another colonoscopy by PCP now that he is over 18.  We will proceed with colonoscopy with propofol with Dr. Gala Romney in the near future.  Due to constipation we will increase Linzess to 90 mcg daily for 4 days prior to procedure along with Fleet enema morning of procedure.  Medication adjustments discussed.  Previously with history of elevated LFTs felt to be secondary to MS medication. Most recent labs within normal limits and currently not on any medications for his MS.  PLAN   Proceed with colonoscopy with propofol by Dr. Gala Romney in near future: the risks, benefits, and alternatives have been discussed with the patient in detail. The patient states understanding and desires to proceed. ASA 3 Linzess 290 mcg for 4 days prior to procedure Continue senna Hold oral diabetes medication night prior and morning of procedure.  Fleet enema morning of procedure Continue Pantoprazole 40 mg nightly Follow up in 1 year or sooner if needed.     Venetia Night, MSN, FNP-BC, AGACNP-BC [facility]

## 2022-03-02 ENCOUNTER — Ambulatory Visit (INDEPENDENT_AMBULATORY_CARE_PROVIDER_SITE_OTHER): Payer: Medicare HMO | Admitting: Gastroenterology

## 2022-03-02 ENCOUNTER — Encounter: Payer: Self-pay | Admitting: Gastroenterology

## 2022-03-02 VITALS — BP 108/72 | HR 84 | Temp 98.2°F | Ht 75.0 in | Wt 235.0 lb

## 2022-03-02 DIAGNOSIS — R1084 Generalized abdominal pain: Secondary | ICD-10-CM | POA: Diagnosis not present

## 2022-03-02 DIAGNOSIS — Z1211 Encounter for screening for malignant neoplasm of colon: Secondary | ICD-10-CM | POA: Diagnosis not present

## 2022-03-02 DIAGNOSIS — K219 Gastro-esophageal reflux disease without esophagitis: Secondary | ICD-10-CM

## 2022-03-02 DIAGNOSIS — K5904 Chronic idiopathic constipation: Secondary | ICD-10-CM | POA: Diagnosis not present

## 2022-03-02 NOTE — Patient Instructions (Addendum)
We are scheduling you for colonoscopy in the near future with Dr. Gala Romney.   Instructions for colonoscopy: We will have you take double the dose of Linzess (290 mcg) daily for 4 days prior to procedure.  Continue daily senna.  Fleet enema x1 the morning of procedure.  You will need to hold metformin the night prior to and morning of procedure.   Continue Pantoprazole 40 mg nightly.    It was a pleasure to see you today. I want to create trusting relationships with patients. If you receive a survey regarding your visit,  I greatly appreciate you taking time to fill this out on paper or through your MyChart. I value your feedback.  Venetia Night, MSN, FNP-BC, AGACNP-BC [facility]

## 2022-03-26 ENCOUNTER — Telehealth: Payer: Self-pay | Admitting: *Deleted

## 2022-03-26 NOTE — Telephone Encounter (Signed)
Called [facility], LMTCB for scheduler to schedule TCS with Dr. Jena Gauss, ASA 3

## 2022-04-06 ENCOUNTER — Encounter: Payer: Self-pay | Admitting: *Deleted

## 2022-04-06 NOTE — Telephone Encounter (Signed)
I have mailed letter

## 2022-04-15 ENCOUNTER — Encounter: Payer: Self-pay | Admitting: *Deleted

## 2022-04-15 MED ORDER — BISACODYL 5 MG PO TBEC: DELAYED_RELEASE_TABLET | ORAL | 0 refills | Status: DC

## 2022-04-15 MED ORDER — FLEET ENEMA 7-19 GM/118ML RE ENEM: 1.0000 | ENEMA | Freq: Once | RECTAL | 0 refills | Status: AC

## 2022-04-15 MED ORDER — PEG 3350-KCL-NA BICARB-NACL 420 G PO SOLR: 4000.0000 mL | Freq: Once | ORAL | 0 refills | Status: AC

## 2022-04-15 MED ORDER — LINACLOTIDE 290 MCG PO CAPS: 290.0000 ug | ORAL_CAPSULE | Freq: Every day | ORAL | 0 refills | Status: DC

## 2022-04-15 NOTE — Telephone Encounter (Signed)
SPoke with [employer]. Pt scheduled for 8/4 at 9:15am. Was advised to send rx to [facility]. Instructions faxed to [phone] attn: Janelle Floor

## 2022-04-15 NOTE — Telephone Encounter (Signed)
PA approved via [employer]here. Authorization #[id number], DOS: 05/01/22-07/30/22

## 2022-04-15 NOTE — Addendum Note (Signed)
Addended by: Armstead Peaks on: 04/15/2022 11:50 AM   Modules accepted: Orders

## 2022-04-27 NOTE — Patient Instructions (Signed)
Vincent Anthony  04/27/2022     @PREFPERIOPPHARMACY @   Your procedure is scheduled on  05/01/2022.   Report to 07/01/2022 at  [phone]  A.M.   Call this number if you have problems the morning of surgery:  [phone]   Remember:  Follow the diet and prep instructions given to you by the office.     DO NOT take any medications for diabetes the morning of your procedure.    Take these medicines the morning of surgery with A SIP OF WATER         lioresal, zyrtec, klonopin, keppra, provigil, zofran (if needed),ditropan, protonix, lyrica.     Do not wear jewelry, make-up or nail polish.  Do not wear lotions, powders, or perfumes, or deodorant.  Do not shave 48 hours prior to surgery.  Men may shave face and neck.  Do not bring valuables to the hospital.  [facility] is not responsible for any belongings or valuables.  Contacts, dentures or bridgework may not be worn into surgery.  Leave your suitcase in the car.  After surgery it may be brought to your room.  For patients admitted to the hospital, discharge time will be determined by your treatment team.  Patients discharged the day of surgery will not be allowed to drive home and must have someone with them for 24 hours.     Special instructions:   DO NOT smoke tobacco or vape for 24 hours before your procedure.  Please read over the following fact sheets that you were given. Anesthesia Post-op Instructions and Care and Recovery After Surgery      Colonoscopy, Adult, Care After The following information offers guidance on how to care for yourself after your procedure. Your health care provider may also give you more specific instructions. If you have problems or questions, contact your health care provider. What can I expect after the procedure? After the procedure, it is common to have: A small amount of blood in your stool for 24 hours after the procedure. Some gas. Mild cramping or bloating of your  abdomen. Follow these instructions at home: Eating and drinking  Drink enough fluid to keep your urine pale yellow. Follow instructions from your health care provider about eating or drinking restrictions. Resume your normal diet as told by your health care provider. Avoid heavy or fried foods that are hard to digest. Activity Rest as told by your health care provider. Avoid sitting for a long time without moving. Get up to take short walks every 1-2 hours. This is important to improve blood flow and breathing. Ask for help if you feel weak or unsteady. Return to your normal activities as told by your health care provider. Ask your health care provider what activities are safe for you. Managing cramping and bloating  Try walking around when you have cramps or feel bloated. If directed, apply heat to your abdomen as told by your health care provider. Use the heat source that your health care provider recommends, such as a moist heat pack or a heating pad. Place a towel between your skin and the heat source. Leave the heat on for 20-30 minutes. Remove the heat if your skin turns bright red. This is especially important if you are unable to feel pain, heat, or cold. You have a greater risk of getting burned. General instructions If you were given a sedative during the procedure, it can affect you for several hours. Do not  drive or operate machinery until your health care provider says that it is safe. For the first 24 hours after the procedure: Do not sign important documents. Do not drink alcohol. Do your regular daily activities at a slower pace than normal. Eat soft foods that are easy to digest. Take over-the-counter and prescription medicines only as told by your health care provider. Keep all follow-up visits. This is important. Contact a health care provider if: You have blood in your stool 2-3 days after the procedure. Get help right away if: You have more than a small spotting of  blood in your stool. You have large blood clots in your stool. You have swelling of your abdomen. You have nausea or vomiting. You have a fever. You have increasing pain in your abdomen that is not relieved with medicine. These symptoms may be an emergency. Get help right away. Call 911. Do not wait to see if the symptoms will go away. Do not drive yourself to the hospital. Summary After the procedure, it is common to have a small amount of blood in your stool. You may also have mild cramping and bloating of your abdomen. If you were given a sedative during the procedure, it can affect you for several hours. Do not drive or operate machinery until your health care provider says that it is safe. Get help right away if you have a lot of blood in your stool, nausea or vomiting, a fever, or increased pain in your abdomen. This information is not intended to replace advice given to you by your health care provider. Make sure you discuss any questions you have with your health care provider. Document Revised: 05/07/2021 Document Reviewed: 05/07/2021 Elsevier Patient Education  2023 [employer] Monitored Anesthesia Care, Care After This sheet gives you information about how to care for yourself after your procedure. Your health care provider may also give you more specific instructions. If you have problems or questions, contact your health care provider. What can I expect after the procedure? After the procedure, it is common to have: Tiredness. Forgetfulness about what happened after the procedure. Impaired judgment for important decisions. Nausea or vomiting. Some difficulty with balance. Follow these instructions at home: For the time period you were told by your health care provider:     Rest as needed. Do not participate in activities where you could fall or become injured. Do not drive or use machinery. Do not drink alcohol. Do not take sleeping pills or medicines that cause  drowsiness. Do not make important decisions or sign legal documents. Do not take care of children on your own. Eating and drinking Follow the diet that is recommended by your health care provider. Drink enough fluid to keep your urine pale yellow. If you vomit: Drink water, juice, or soup when you can drink without vomiting. Make sure you have little or no nausea before eating solid foods. General instructions Have a responsible adult stay with you for the time you are told. It is important to have someone help care for you until you are awake and alert. Take over-the-counter and prescription medicines only as told by your health care provider. If you have sleep apnea, surgery and certain medicines can increase your risk for breathing problems. Follow instructions from your health care provider about wearing your sleep device: Anytime you are sleeping, including during daytime naps. While taking prescription pain medicines, sleeping medicines, or medicines that make you drowsy. Avoid smoking. Keep all follow-up visits as told by  your health care provider. This is important. Contact a health care provider if: You keep feeling nauseous or you keep vomiting. You feel light-headed. You are still sleepy or having trouble with balance after 24 hours. You develop a rash. You have a fever. You have redness or swelling around the IV site. Get help right away if: You have trouble breathing. You have new-onset confusion at home. Summary For several hours after your procedure, you may feel tired. You may also be forgetful and have poor judgment. Have a responsible adult stay with you for the time you are told. It is important to have someone help care for you until you are awake and alert. Rest as told. Do not drive or operate machinery. Do not drink alcohol or take sleeping pills. Get help right away if you have trouble breathing, or if you suddenly become confused. This information is not  intended to replace advice given to you by your health care provider. Make sure you discuss any questions you have with your health care provider. Document Revised: 08/19/2021 Document Reviewed: 08/17/2019 Elsevier Patient Education  2023 [employer].

## 2022-04-29 ENCOUNTER — Encounter (HOSPITAL_COMMUNITY): Payer: Self-pay

## 2022-04-29 ENCOUNTER — Encounter (HOSPITAL_COMMUNITY)
Admission: RE | Admit: 2022-04-29 | Discharge: 2022-04-29 | Disposition: A | Discharge status: Home / Self Care | Payer: Medicare HMO | Source: Ambulatory Visit | Attending: Internal Medicine | Admitting: Internal Medicine

## 2022-04-29 VITALS — BP 127/77 | HR 93 | Temp 97.9°F | Resp 18 | Ht 75.0 in | Wt 242.0 lb

## 2022-04-29 DIAGNOSIS — Z79899 Other long term (current) drug therapy: Secondary | ICD-10-CM | POA: Insufficient documentation

## 2022-04-29 DIAGNOSIS — Z862 Personal history of diseases of the blood and blood-forming organs and certain disorders involving the immune mechanism: Secondary | ICD-10-CM | POA: Insufficient documentation

## 2022-04-29 DIAGNOSIS — Z01812 Encounter for preprocedural laboratory examination: Secondary | ICD-10-CM | POA: Diagnosis present

## 2022-04-29 HISTORY — DX: Type 2 diabetes mellitus without complications: E11.9

## 2022-04-29 LAB — BASIC METABOLIC PANEL
Anion gap: 10 (ref 5–15)
BUN: 16 mg/dL (ref 6–20)
CO2: 26 mmol/L (ref 22–32)
Calcium: 9.5 mg/dL (ref 8.9–10.3)
Chloride: 102 mmol/L (ref 98–111)
Creatinine, Ser: 1.15 mg/dL (ref 0.61–1.24)
GFR, Estimated: >60 mL/min (ref >60)
Glucose, Bld: 91 mg/dL (ref 70–99)
Potassium: 4.1 mmol/L (ref 3.5–5.1)
Sodium: 138 mmol/L (ref 135–145)

## 2022-04-29 LAB — CBC WITH DIFFERENTIAL/PLATELET
Abs Immature Granulocytes: 0.03 10*3/uL (ref 0.00–0.07)
Basophils Absolute: 0 10*3/uL (ref 0.0–0.1)
Basophils Relative: 1 %
Eosinophils Absolute: 0.1 10*3/uL (ref 0.0–0.5)
Eosinophils Relative: 1 %
HCT: 40.6 % (ref 39.0–52.0)
Hemoglobin: 12.8 g/dL — ABNORMAL LOW (ref 13.0–17.0)
Immature Granulocytes: 0 %
Lymphocytes Relative: 14 %
Lymphs Abs: 1.1 10*3/uL (ref 0.7–4.0)
MCH: 26.6 pg (ref 26.0–34.0)
MCHC: 31.5 g/dL (ref 30.0–36.0)
MCV: 84.4 fL (ref 80.0–100.0)
Monocytes Absolute: 0.6 10*3/uL (ref 0.1–1.0)
Monocytes Relative: 8 %
Neutro Abs: 6.1 10*3/uL (ref 1.7–7.7)
Neutrophils Relative %: 76 %
Platelets: 322 10*3/uL (ref 150–400)
RBC: 4.81 MIL/uL (ref 4.22–5.81)
RDW: 15.7 % — ABNORMAL HIGH (ref 11.5–15.5)
WBC: 8 10*3/uL (ref 4.0–10.5)
nRBC: 0 % (ref 0.0–0.2)

## 2022-05-01 ENCOUNTER — Ambulatory Visit (HOSPITAL_COMMUNITY): Payer: Medicare HMO | Admitting: Anesthesiology

## 2022-05-01 ENCOUNTER — Encounter (HOSPITAL_COMMUNITY): Payer: Self-pay | Admitting: Internal Medicine

## 2022-05-01 ENCOUNTER — Telehealth: Payer: Self-pay

## 2022-05-01 ENCOUNTER — Ambulatory Visit (HOSPITAL_BASED_OUTPATIENT_CLINIC_OR_DEPARTMENT_OTHER): Payer: Medicare HMO | Admitting: Anesthesiology

## 2022-05-01 ENCOUNTER — Ambulatory Visit (HOSPITAL_COMMUNITY)
Admission: RE | Admit: 2022-05-01 | Discharge: 2022-05-01 | Disposition: A | Discharge status: Home / Self Care | Payer: Medicare HMO | Source: Ambulatory Visit | Attending: Internal Medicine | Admitting: Internal Medicine

## 2022-05-01 ENCOUNTER — Encounter (HOSPITAL_COMMUNITY)
Admission: RE | Disposition: A | Discharge status: Home / Self Care | Payer: Self-pay | Source: Ambulatory Visit | Attending: Internal Medicine

## 2022-05-01 DIAGNOSIS — Z1211 Encounter for screening for malignant neoplasm of colon: Secondary | ICD-10-CM | POA: Diagnosis present

## 2022-05-01 DIAGNOSIS — E119 Type 2 diabetes mellitus without complications: Secondary | ICD-10-CM | POA: Diagnosis not present

## 2022-05-01 DIAGNOSIS — K219 Gastro-esophageal reflux disease without esophagitis: Secondary | ICD-10-CM | POA: Insufficient documentation

## 2022-05-01 DIAGNOSIS — Q438 Other specified congenital malformations of intestine: Secondary | ICD-10-CM | POA: Diagnosis not present

## 2022-05-01 DIAGNOSIS — Z79899 Other long term (current) drug therapy: Secondary | ICD-10-CM | POA: Insufficient documentation

## 2022-05-01 DIAGNOSIS — R569 Unspecified convulsions: Secondary | ICD-10-CM | POA: Insufficient documentation

## 2022-05-01 DIAGNOSIS — Z7984 Long term (current) use of oral hypoglycemic drugs: Secondary | ICD-10-CM | POA: Diagnosis not present

## 2022-05-01 HISTORY — PX: COLONOSCOPY WITH PROPOFOL: SHX5780

## 2022-05-01 LAB — GLUCOSE, CAPILLARY
Glucose-Capillary: 109 mg/dL — ABNORMAL HIGH (ref 70–99)
Glucose-Capillary: 102 mg/dL — ABNORMAL HIGH (ref 70–99)

## 2022-05-01 SURGERY — COLONOSCOPY WITH PROPOFOL
Anesthesia: General

## 2022-05-01 MED ORDER — PROPOFOL 500 MG/50ML IV EMUL
INTRAVENOUS | Status: DC | PRN
  Administered 2022-05-01: 150 ug/kg/min via INTRAVENOUS

## 2022-05-01 MED ORDER — PHENYLEPHRINE HCL (PRESSORS) 10 MG/ML IV SOLN
INTRAVENOUS | Status: DC | PRN
  Administered 2022-05-01: 80 ug via INTRAVENOUS
  Administered 2022-05-01 (×2): 160 ug via INTRAVENOUS
  Administered 2022-05-01: 80 ug via INTRAVENOUS

## 2022-05-01 MED ORDER — LIDOCAINE HCL (CARDIAC) PF 100 MG/5ML IV SOSY
PREFILLED_SYRINGE | INTRAVENOUS | Status: DC | PRN
  Administered 2022-05-01: 60 mg via INTRATRACHEAL

## 2022-05-01 MED ORDER — LACTATED RINGERS IV SOLN: INTRAVENOUS | Status: DC | PRN

## 2022-05-01 MED ORDER — PROPOFOL 10 MG/ML IV BOLUS
INTRAVENOUS | Status: DC | PRN
  Administered 2022-05-01: 80 mg via INTRAVENOUS

## 2022-05-01 NOTE — Anesthesia Postprocedure Evaluation (Signed)
Anesthesia Post Note  Patient: Vincent Anthony  Procedure(s) Performed: COLONOSCOPY WITH PROPOFOL  Patient location during evaluation: Phase II Anesthesia Type: General Level of consciousness: awake Pain management: pain level controlled Vital Signs Assessment: post-procedure vital signs reviewed and stable Respiratory status: spontaneous breathing and respiratory function stable Cardiovascular status: blood pressure returned to baseline and stable Postop Assessment: no headache and no apparent nausea or vomiting Anesthetic complications: no Comments: Late entry   No notable events documented.   Last Vitals:  Vitals:   05/01/22 0800 05/01/22 1001  BP: 126/85 122/79  Pulse: 100 92  Resp: 17 16  Temp: 36.8 C 36.5 C  SpO2: 98% 95%    Last Pain:  Vitals:   05/01/22 1001  TempSrc: Oral  PainSc: Asleep                 Windell Norfolk

## 2022-05-01 NOTE — Telephone Encounter (Signed)
-----   Message from Corbin Ade, MD sent at 05/01/2022 10:14 AM EDT ----- Patient had a tough colon today.  Was able to see it to the cecum except for the rectum and the distal sigmoid because of formed stool.  Need to bring him back in 1 year for flex sig (still needs a colonoscopy prep and needs some good enemas so I can see the very lower end.  Do not need to do it any sooner than a year from now.

## 2022-05-01 NOTE — H&P (Signed)
@LOGO @   Primary Care Physician:  , MD Primary Gastroenterologist:  Dr. Smith Olof Marcil  Pre-Procedure History & Physical: HPI:  Vincent Anthony is a 51 y.o. male is here for a screening colonoscopy.  Last colonoscopy 2014 without significant findings.  He is here for average risk screening.  Past Medical History:  Diagnosis Date   Arthritis    Diabetes mellitus without complication (HCC)    Elevated LFTs    Elevated liver enzymes    GERD (gastroesophageal reflux disease)    Headache    MS (multiple sclerosis) (HCC)    Neurogenic bladder    Tremor     Past Surgical History:  Procedure Laterality Date   COLONOSCOPY  2014   HERNIA REPAIR     KNEE SURGERY     TESTICLE REMOVAL      Prior to Admission medications   Medication Sig Start Date End Date Taking? Authorizing Provider  amitriptyline (ELAVIL) 10 MG tablet Take 1 tablet (10 mg total) by mouth at bedtime. Hold until linezolid course has finished Patient taking differently: Take 10 mg by mouth at bedtime. 01/06/21  Yes Pokhrel, Laxman, MD  artificial tears (LACRILUBE) OINT ophthalmic ointment Place into both eyes every 4 (four) hours as needed for dry eyes. 01/06/21  Yes Pokhrel, Laxman, MD  baclofen (LIORESAL) 10 MG tablet Take 1 tablet by mouth 3 (three) times daily. 11/09/14  Yes [provider]  cetirizine (ZYRTEC) 10 MG tablet Take 10 mg by mouth daily.   Yes [provider]  Cholecalciferol (VITAMIN D3) 50 MCG (2000 UT) TABS Take 2,000 Units by mouth daily.   Yes [provider]  clonazePAM (KLONOPIN) 1 MG tablet Take 1 mg by mouth 2 (two) times daily.   Yes [provider]  Emollient (EUCERIN) lotion Apply 1 Application topically every 12 (twelve) hours as needed for dry skin.   Yes [provider]  fluticasone (FLONASE) 50 MCG/ACT nasal spray Place 1 spray into both nostrils daily.   Yes [provider]  hyoscyamine (LEVBID) 0.375 MG 12 hr tablet TAKE (1)  TABLET BYMOUTH EVERY TWELVE HOURS. 09/16/20  Yes 09/18/20, NP  levETIRAcetam (KEPPRA) 250 MG tablet Take 250 mg by mouth 2 (two) times daily. 11/09/14  Yes [provider]  linaclotide 01/08/15) 290 MCG CAPS capsule Take 1 capsule (290 mcg total) by mouth daily before breakfast. 04/15/22  Yes Filimon Miranda, 04/17/22, MD  metFORMIN (GLUCOPHAGE) 500 MG tablet Take 500 mg by mouth 2 (two) times daily.   Yes [provider]  modafinil (PROVIGIL) 200 MG tablet Take 200 mg by mouth daily.   Yes [provider]  Multiple Vitamin (MULTIVITAMIN WITH MINERALS) TABS tablet Take 1 tablet by mouth daily.   Yes [provider]  ondansetron (ZOFRAN) 4 MG tablet Take 4 mg by mouth every 8 (eight) hours as needed for nausea or vomiting.   Yes [provider]  pantoprazole (PROTONIX) 40 MG tablet Take 1 tablet (40 mg total) by mouth at bedtime. 01/06/21  Yes Pokhrel, Laxman, MD  pravastatin (PRAVACHOL) 20 MG tablet Take 20 mg by mouth daily.   Yes [provider]  pregabalin (LYRICA) 150 MG capsule Take 150 mg by mouth at bedtime. 02/06/22  Yes [provider]  pregabalin (LYRICA) 75 MG capsule Take 75 mg by mouth daily.   Yes [provider]  senna (SENOKOT) 8.6 MG tablet Take 2 tablets (17.2 mg total) by mouth 2 (two) times daily. Hold for loose  stools >2/day Patient taking differently: Take 1 tablet by mouth 2 (two) times daily. Hold for loose stools >2/day 01/06/21  Yes Pokhrel, Rebekah Chesterfield, MD  bisacodyl 5 MG EC tablet As directed Patient not taking: Reported on 04/28/2022 04/15/22   Greysyn Vanderberg, Gerrit Friends, MD  linaclotide [facility]) 145 MCG CAPS capsule Take 1 capsule (145 mcg total) by mouth daily before breakfast. Skip a dose if diarrhoea (loose stools)>2/times a day Patient not taking: Reported on 04/28/2022 01/06/21   Pokhrel, Rebekah Chesterfield, MD  simethicone (MYLICON) 40 MG/0.6ML drops Take 0.6 mLs (40 mg total) by mouth 4 (four) times daily for 10 days. Patient not  taking: Reported on 04/28/2022 01/06/21 01/16/21  Joycelyn Das, MD    Allergies as of 04/15/2022 - Review Complete 03/02/2022  Allergen Reaction Noted   Celebrex [celecoxib] Nausea And Vomiting 03/04/2013   Clindamycin Diarrhea 09/14/2012   Vioxx [rofecoxib] Nausea And Vomiting 03/04/2013   Zanaflex [tizanidine hcl] Nausea And Vomiting 11/30/2014    Family History  Problem Relation Age of Onset   Heart attack Mother    Diabetes Father    Hypertension Father    Colon cancer Neg Hx    Colonic polyp Neg Hx     Social History   Socioeconomic History   Marital status: Married    Spouse name: Not on file   Number of children: Not on file   Years of education: Not on file   Highest education level: Not on file  Occupational History   Not on file  Tobacco Use   Smoking status: Never   Smokeless tobacco: Never  Substance and Sexual Activity   Alcohol use: No    Comment: occasionally   Drug use: No   Sexual activity: Not on file  Other Topics Concern   Not on file  Social History Narrative   Not on file   Social Determinants of Health   Financial Resource Strain: Not on file  Food Insecurity: Not on file  Transportation Needs: Not on file  Physical Activity: Not on file  Stress: Not on file  Social Connections: Not on file  Intimate Partner Violence: Not on file    Review of Systems: See HPI, otherwise negative ROS  Physical Exam: BP 126/85   Pulse 100   Temp 98.2 F (36.8 C) (Oral)   Resp 17   Ht 6\' 3"  (1.905 m)   Wt 109.8 kg   SpO2 98%   BMI 30.25 kg/m  General:   Alert,  Well-developed, well-nourished, pleasant and cooperative in NAD Lungs:  Clear throughout to auscultation.   No wheezes, crackles, or rhonchi. No acute distress. Heart:  Regular rate and rhythm; no murmurs, clicks, rubs,  or gallops. Abdomen:  Soft, nontender and nondistended. No masses, hepatosplenomegaly or hernias noted. Normal bowel sounds, without guarding, and without rebound.    Impression/Plan: [name] is now here to undergo a screening colonoscopy.  Average risk examination  Risks, benefits, limitations, imponderables and alternatives regarding colonoscopy have been reviewed with the patient. Questions have been answered. All parties agreeable.     Notice:  This dictation was prepared with Dragon dictation along with smaller phrase technology. Any transcriptional errors that result from this process are unintentional and may not be corrected upon review.

## 2022-05-01 NOTE — Telephone Encounter (Signed)
Noted, routing to susan to nic

## 2022-05-01 NOTE — Op Note (Signed)
[facility] Patient Name: Vincent Anthony Procedure Date: 05/01/2022 8:45 AM MRN: [id number] Date of Birth: [birth year] Attending MD: Norvel Richards , MD CSN: [id number] Age: 51 Admit Type: Outpatient Procedure:                Colonoscopy Indications:              Screening for colorectal malignant neoplasm Providers:                Norvel Richards, MD, Charlsie Quest. Theda Sers RN, RN,                            Rosina Lowenstein, RN, Ladoris Gene, Technician, Randa Spike, Technician Referring MD:              Medicines:                Propofol per Anesthesia Complications:            No immediate complications. Estimated Blood Loss:     Estimated blood loss: none. Procedure:                Pre-Anesthesia Assessment:                           - Prior to the procedure, a History and Physical                            was performed, and patient medications and                            allergies were reviewed. The patient's tolerance of                            previous anesthesia was also reviewed. The risks                            and benefits of the procedure and the sedation                            options and risks were discussed with the patient.                            All questions were answered, and informed consent                            was obtained. Prior Anticoagulants: The patient has                            taken no previous anticoagulant or antiplatelet                            agents. ASA Grade Assessment: III - A patient with  severe systemic disease. After reviewing the risks                            and benefits, the patient was deemed in                            satisfactory condition to undergo the procedure.                           After obtaining informed consent, the colonoscope                            was passed under direct vision. Throughout the                             procedure, the patient's blood pressure, pulse, and                            oxygen saturations were monitored continuously. The                            [id number]) scope was introduced through the                            anus and advanced to the the cecum, identified by                            appendiceal orifice and ileocecal valve. The entire                            colon was well visualized. Scope In: 9:09:51 AM Scope Out: 10:58:45 AM Scope Withdrawal Time: 1 hour 9 minutes 36 seconds  Total Procedure Duration: 1 hour 48 minutes 54 seconds  Findings:      The perianal and digital rectal examinations were normal. Markedly       redundant, capacious and elongated colon. External abdominal pressure       and changing of the patient's position required to reach the cecum.       There were formed stool elements in the rectum and rectosigmoid. Could       not absolutely clear the segments however from the mid sigmoid onto the       colon aside from some liquid effluent the prep was adequate. Quite a bit       of suctioning and lavage was undertaken. I believe the colon was       adequately visualized except for the distal sigmoid and rectum. No       mucosal abnormalities seen.      The exam was otherwise without abnormality on direct and retroflexion       views. Impression:               - Markedly capacious, redundant and elongated colon                            as described. The colonic mucosa was seen well and  appeared normal. The rectum and distal sigmoid                            segments .                           -Could not be seen well today. Moderate Sedation:      Moderate (conscious) sedation was personally administered by an       anesthesia professional. The following parameters were monitored: oxygen       saturation, heart rate, blood pressure, respiratory rate, EKG, adequacy       of pulmonary ventilation, and  response to care. Recommendation:           - Patient has a contact number available for                            emergencies. The signs and symptoms of potential                            delayed complications were discussed with the                            patient. Return to normal activities tomorrow.                            Written discharge instructions were provided to the                            patient.                           - Advance diet as tolerated.                           - Continue present medications. Recommend patient                            return in 1 year for sigmoidoscopy to assess the                            rectum and distal sigmoid not seen well today.                           - Repeat FS in 1 year for screening purposes.                           - Return to GI office (date not yet determined). Procedure Code(s):        --- Professional ---                           [phone], Colonoscopy, flexible; diagnostic, including                            collection of specimen(s) by brushing or washing,  when performed (separate procedure) Diagnosis Code(s):        --- Professional ---                           Z12.11, Encounter for screening for malignant                            neoplasm of colon CPT copyright 2019 [employer]. All rights reserved. The codes documented in this report are preliminary and upon coder review may  be revised to meet current compliance requirements. Gerrit Friends. Sherrelle Prochazka, MD Gennette Pac, MD 05/01/2022 10:07:04 AM This report has been signed electronically. Number of Addenda: 0

## 2022-05-01 NOTE — Transfer of Care (Signed)
Immediate Anesthesia Transfer of Care Note  Patient: Vincent Anthony  Procedure(s) Performed: COLONOSCOPY WITH PROPOFOL  Patient Location: Short Stay  Anesthesia Type:General  Level of Consciousness: sedated  Airway & Oxygen Therapy: Patient Spontanous Breathing  Post-op Assessment: Report given to RN and Post -op Vital signs reviewed and stable  Post vital signs: Reviewed and stable  Last Vitals:  Vitals Value Taken Time  BP 90/60   Temp 36   Pulse 75   Resp 16   SpO2 96     Last Pain:  Vitals:   05/01/22 1001  TempSrc: Oral  PainSc: Asleep         Complications: No notable events documented.

## 2022-05-01 NOTE — Anesthesia Preprocedure Evaluation (Signed)
Anesthesia Evaluation  Patient identified by MRN, date of birth, ID band Patient awake    Reviewed: Allergy & Precautions, H&P , NPO status , Patient's Chart, lab work & pertinent test results, reviewed documented beta blocker date and time   Airway Mallampati: II  TM Distance: >3 FB Neck ROM: full    Dental no notable dental hx.    Pulmonary neg pulmonary ROS,    Pulmonary exam normal breath sounds clear to auscultation       Cardiovascular Exercise Tolerance: Good negative cardio ROS   Rhythm:regular Rate:Normal     Neuro/Psych  Headaches, Seizures -, Well Controlled,   Neuromuscular disease negative psych ROS   GI/Hepatic Neg liver ROS, GERD  Medicated,  Endo/Other  negative endocrine ROSdiabetes  Renal/GU   negative genitourinary   Musculoskeletal   Abdominal   Peds  Hematology negative hematology ROS (+)   Anesthesia Other Findings   Reproductive/Obstetrics negative OB ROS                             Anesthesia Physical Anesthesia Plan  ASA: 3  Anesthesia Plan: General   Post-op Pain Management:    Induction:   PONV Risk Score and Plan: Propofol infusion  Airway Management Planned:   Additional Equipment:   Intra-op Plan:   Post-operative Plan:   Informed Consent: I have reviewed the patients History and Physical, chart, labs and discussed the procedure including the risks, benefits and alternatives for the proposed anesthesia with the patient or authorized representative who has indicated his/her understanding and acceptance.     Dental Advisory Given  Plan Discussed with: CRNA  Anesthesia Plan Comments:         Anesthesia Quick Evaluation

## 2022-05-01 NOTE — Discharge Instructions (Signed)
  Colonoscopy Discharge Instructions  Read the instructions outlined below and refer to this sheet in the next few weeks. These discharge instructions provide you with general information on caring for yourself after you leave the hospital. Your doctor may also give you specific instructions. While your treatment has been planned according to the most current medical practices available, unavoidable complications occasionally occur. If you have any problems or questions after discharge, call Dr. Jena Gauss at [phone]. ACTIVITY You may resume your regular activity, but move at a slower pace for the next 24 hours.  Take frequent rest periods for the next 24 hours.  Walking will help get rid of the air and reduce the bloated feeling in your belly (abdomen).  No driving for 24 hours (because of the medicine (anesthesia) used during the test).   Do not sign any important legal documents or operate any machinery for 24 hours (because of the anesthesia used during the test).  NUTRITION Drink plenty of fluids.  You may resume your normal diet as instructed by your doctor.  Begin with a light meal and progress to your normal diet. Heavy or fried foods are harder to digest and may make you feel sick to your stomach (nauseated).  Avoid alcoholic beverages for 24 hours or as instructed.  MEDICATIONS You may resume your normal medications unless your doctor tells you otherwise.  WHAT YOU CAN EXPECT TODAY Some feelings of bloating in the abdomen.  Passage of more gas than usual.  Spotting of blood in your stool or on the toilet paper.  IF YOU HAD POLYPS REMOVED DURING THE COLONOSCOPY: No aspirin products for 7 days or as instructed.  No alcohol for 7 days or as instructed.  Eat a soft diet for the next 24 hours.  FINDING OUT THE RESULTS OF YOUR TEST Not all test results are available during your visit. If your test results are not back during the visit, make an appointment with your caregiver to find out the  results. Do not assume everything is normal if you have not heard from your caregiver or the medical facility. It is important for you to follow up on all of your test results.  SEEK IMMEDIATE MEDICAL ATTENTION IF: You have more than a spotting of blood in your stool.  Your belly is swollen (abdominal distention).  You are nauseated or vomiting.  You have a temperature over 101.  You have abdominal pain or discomfort that is severe or gets worse throughout the day.     Most of your colon was seen well.  The rectum and the lower end could not be seen because of a poor prep.  Because this is a screening examination, I recommend that you come back in 1 year and have a limited colonoscopy (sigmoidoscopy) to look at your rectum and distal sigmoid for completion.  It does not need to be done at this time  As far as a repeat colonoscopy is concerned, I recommend you return in 10 years for an average rescreening colonoscopy pending results of sigmoidoscopy in 1 year.

## 2022-05-04 ENCOUNTER — Telehealth: Payer: Self-pay | Admitting: Internal Medicine

## 2022-05-04 NOTE — Telephone Encounter (Signed)
Pt has questions for the nurse. [phone]

## 2022-05-04 NOTE — Telephone Encounter (Signed)
Lmom for pt to return my call.  

## 2022-05-05 NOTE — Telephone Encounter (Signed)
Lmom for pt to return my call.  

## 2022-05-07 ENCOUNTER — Encounter (HOSPITAL_COMMUNITY): Payer: Self-pay | Admitting: Internal Medicine

## 2022-05-07 NOTE — Telephone Encounter (Signed)
Unable to reach patient.

## 2022-05-08 NOTE — Telephone Encounter (Signed)
On recall  °

## 2022-09-10 ENCOUNTER — Emergency Department (HOSPITAL_COMMUNITY)
Admission: EM | Admit: 2022-09-10 | Discharge: 2022-09-10 | Disposition: A | Discharge status: Skilled Nursing Facility | Payer: Medicare HMO | Attending: Student | Admitting: Student

## 2022-09-10 ENCOUNTER — Encounter (HOSPITAL_COMMUNITY): Payer: Self-pay

## 2022-09-10 ENCOUNTER — Emergency Department (HOSPITAL_COMMUNITY): Payer: Medicare HMO

## 2022-09-10 DIAGNOSIS — E119 Type 2 diabetes mellitus without complications: Secondary | ICD-10-CM | POA: Diagnosis not present

## 2022-09-10 DIAGNOSIS — G35 Multiple sclerosis: Secondary | ICD-10-CM | POA: Insufficient documentation

## 2022-09-10 DIAGNOSIS — K567 Ileus, unspecified: Secondary | ICD-10-CM | POA: Diagnosis not present

## 2022-09-10 DIAGNOSIS — Z7984 Long term (current) use of oral hypoglycemic drugs: Secondary | ICD-10-CM | POA: Insufficient documentation

## 2022-09-10 DIAGNOSIS — K5900 Constipation, unspecified: Secondary | ICD-10-CM | POA: Diagnosis not present

## 2022-09-10 DIAGNOSIS — R109 Unspecified abdominal pain: Secondary | ICD-10-CM | POA: Diagnosis present

## 2022-09-10 DIAGNOSIS — G35D Multiple sclerosis, unspecified: Secondary | ICD-10-CM

## 2022-09-10 LAB — BASIC METABOLIC PANEL
Anion gap: 11 (ref 5–15)
BUN: 14 mg/dL (ref 6–20)
CO2: 27 mmol/L (ref 22–32)
Calcium: 9.4 mg/dL (ref 8.9–10.3)
Chloride: 99 mmol/L (ref 98–111)
Creatinine, Ser: 1.24 mg/dL (ref 0.61–1.24)
GFR, Estimated: >60 mL/min (ref >60)
Glucose, Bld: 103 mg/dL — ABNORMAL HIGH (ref 70–99)
Potassium: 3.9 mmol/L (ref 3.5–5.1)
Sodium: 137 mmol/L (ref 135–145)

## 2022-09-10 LAB — HEPATIC FUNCTION PANEL
ALT: 15 U/L (ref 0–44)
AST: 17 U/L (ref 15–41)
Albumin: 4 g/dL (ref 3.5–5.0)
Alkaline Phosphatase: 135 U/L — ABNORMAL HIGH (ref 38–126)
Bilirubin, Direct: <0.1 mg/dL (ref 0.0–0.2)
Total Bilirubin: 0.5 mg/dL (ref 0.3–1.2)
Total Protein: 7.8 g/dL (ref 6.5–8.1)

## 2022-09-10 LAB — CBC WITH DIFFERENTIAL/PLATELET
Abs Immature Granulocytes: 0.01 10*3/uL (ref 0.00–0.07)
Basophils Absolute: 0 10*3/uL (ref 0.0–0.1)
Basophils Relative: 1 %
Eosinophils Absolute: 0.1 10*3/uL (ref 0.0–0.5)
Eosinophils Relative: 1 %
HCT: 39.7 % (ref 39.0–52.0)
Hemoglobin: 12.4 g/dL — ABNORMAL LOW (ref 13.0–17.0)
Immature Granulocytes: 0 %
Lymphocytes Relative: 13 %
Lymphs Abs: 1.1 10*3/uL (ref 0.7–4.0)
MCH: 26 pg (ref 26.0–34.0)
MCHC: 31.2 g/dL (ref 30.0–36.0)
MCV: 83.2 fL (ref 80.0–100.0)
Monocytes Absolute: 0.6 10*3/uL (ref 0.1–1.0)
Monocytes Relative: 7 %
Neutro Abs: 6.4 10*3/uL (ref 1.7–7.7)
Neutrophils Relative %: 78 %
Platelets: 300 10*3/uL (ref 150–400)
RBC: 4.77 MIL/uL (ref 4.22–5.81)
RDW: 16.2 % — ABNORMAL HIGH (ref 11.5–15.5)
WBC: 8.2 10*3/uL (ref 4.0–10.5)
nRBC: 0 % (ref 0.0–0.2)

## 2022-09-10 LAB — URINALYSIS, ROUTINE W REFLEX MICROSCOPIC
Bilirubin Urine: NEGATIVE
Glucose, UA: NEGATIVE mg/dL
Hgb urine dipstick: NEGATIVE
Ketones, ur: NEGATIVE mg/dL
Leukocytes,Ua: NEGATIVE
Nitrite: NEGATIVE
Protein, ur: NEGATIVE mg/dL
Specific Gravity, Urine: 1.005 (ref 1.005–1.030)
pH: 6 (ref 5.0–8.0)

## 2022-09-10 LAB — CBG MONITORING, ED: Glucose-Capillary: 97 mg/dL (ref 70–99)

## 2022-09-10 LAB — LIPASE, BLOOD: Lipase: 24 U/L (ref 11–51)

## 2022-09-10 MED ORDER — BISACODYL 5 MG PO TBEC: DELAYED_RELEASE_TABLET | ORAL | 3 refills | Status: AC

## 2022-09-10 MED ORDER — SENNOSIDES 8.6 MG PO TABS: 2.0000 | ORAL_TABLET | Freq: Two times a day (BID) | ORAL | 2 refills | Status: AC

## 2022-09-10 MED ORDER — LINACLOTIDE 145 MCG PO CAPS
290.0000 ug | ORAL_CAPSULE | Freq: Once | ORAL | Status: AC
  Administered 2022-09-10: 290 ug via ORAL
  Filled 2022-09-10: qty 2

## 2022-09-10 MED ORDER — POLYETHYLENE GLYCOL 3350 17 G PO PACK: 17.0000 g | PACK | Freq: Two times a day (BID) | ORAL | 4 refills | Status: AC

## 2022-09-10 MED ORDER — POLYETHYLENE GLYCOL 3350 17 G PO PACK
17.0000 g | PACK | Freq: Two times a day (BID) | ORAL | Status: DC
  Administered 2022-09-10: 17 g via ORAL
  Filled 2022-09-10: qty 1

## 2022-09-10 MED ORDER — SENNOSIDES-DOCUSATE SODIUM 8.6-50 MG PO TABS
2.0000 | ORAL_TABLET | Freq: Once | ORAL | Status: AC
  Administered 2022-09-10: 2 via ORAL
  Filled 2022-09-10: qty 2

## 2022-09-10 MED ORDER — LINACLOTIDE 145 MCG PO CAPS: 145.0000 ug | ORAL_CAPSULE | Freq: Every day | ORAL | 5 refills | Status: DC

## 2022-09-10 MED ORDER — IOHEXOL 300 MG/ML  SOLN
100.0000 mL | Freq: Once | INTRAMUSCULAR | Status: AC | PRN
  Administered 2022-09-10: 100 mL via INTRAVENOUS

## 2022-09-10 MED ORDER — BISACODYL 10 MG RE SUPP
10.0000 mg | Freq: Once | RECTAL | Status: AC
  Administered 2022-09-10: 10 mg via RECTAL
  Filled 2022-09-10: qty 1

## 2022-09-10 MED ORDER — ALUM & MAG HYDROXIDE-SIMETH 200-200-20 MG/5ML PO SUSP
30.0000 mL | Freq: Once | ORAL | Status: AC
  Administered 2022-09-10: 30 mL via ORAL
  Filled 2022-09-10: qty 30

## 2022-09-10 MED ORDER — BISACODYL 10 MG RE SUPP: 10.0000 mg | Freq: Every day | RECTAL | 0 refills | Status: DC

## 2022-09-10 NOTE — ED Triage Notes (Signed)
Pt BIBA from [facility]. EMS was told pt has an intestinal blockage. Pt had an Xray done yesterday. Pt had small BM today after receiving enema. Pt received miralax last night as well. Pt states that his abd has looked more bloated than normal.

## 2022-09-10 NOTE — Discharge Instructions (Addendum)
1)Please give Dulcolax/Bisacodyl 10 mg suppository every evening for the next 4 days 2)Please Increase Linzess 145 mcg twice daily for next 4 days (09/11/22 thru 09/14/22), then back to once daily 3)Avoid constipation-- 4)May use enemas as needed for bowel evacuation 5)Please see medication changes/adjustments as in the attached discharge medication reconciliation list  Please return to the emergency department for worsening abdominal distention, no bowel movements or fever with abdominal pain.

## 2022-09-10 NOTE — Consult Note (Addendum)
Initial Consultation Note                                                                                                                                                                              Patient Demographics:    Vincent Anthony, is a 51 y.o. male  MRN: [id number]   DOB - 10/03/1970  Admit Date - 09/10/2022  Outpatient Primary MD for the patient is Kerri Perches, MD   Assessment & Plan:   Assessment and Plan: 1)Chronic constipation/Neurogenic bowel in the setting of multiple sclerosis and limited mobility---also partly related to current medications including amitriptyline,  baclofen and Lyrica -CT abdomen pelvis with contrast without acute findings today--- some concerns for ileus -Patient has spontaneous large BM in the ED today -Patient also had a small BM at facility prior to coming here today, and had a BM on 09/09/2022 as well --Patient passing lots of flatus -Patient with chronic bowel problems as outlined above- -bowel regimen adjusted,  please see discharge med rec and discharge instructions -Patient was previously evaluated by GI including Dr. Jena Gauss -UA not suggestive of UTI -No fevers or leukocytosis -Lipase WNL, LFTs are Not elevated -Patient is eating and drinking well in the ED -Bedside RN Meghan present during my evaluation -Patient admits that he has Not been consistent/compliant with Senokot-S and Linzess at times--he skip doses at times in order to avoid diarrhea especially if he is going to have physical therapy -Discussed with EDP  - okay to discharge back to facility  2) multiple sclerosis associated neuromuscular deficits--- okay to continue physical therapy at facility to maintain mobility -Patient follows with neurology -Okay to restart physical therapy - continue current meds  3) chronic headaches--- stable, well-controlled at this time  -patient takes Keppra amitriptyline and Lyrica for chronic headaches advised to follow-up with  neurology  4)GERD-stable continue Protonix  5)DM2-previously well-controlled, continue metformin  Discharge instructions:- 1)Please give Dulcolax/Bisacodyl 10 mg suppository every evening for the next 4 days 2)Please Increase Linzess 145 mcg twice daily for next 4 days (09/11/22 thru 09/14/22), then back to once daily 3)Avoid constipation-- 4)May use enemas as needed for bowel evacuation 5)Please see medication changes/adjustments as in the attached discharge medication reconciliation list  Allergies as of 09/10/2022       Reactions   Celebrex [celecoxib] Nausea And Vomiting   Clindamycin Diarrhea   Vioxx [rofecoxib] Nausea And Vomiting   Zanaflex [tizanidine Hcl] Nausea And Vomiting        Medication List     STOP taking these medications    simethicone 40 MG/0.6ML drops Commonly known as: MYLICON       TAKE these medications    amitriptyline 10 MG tablet  Commonly known as: ELAVIL Take 1 tablet (10 mg total) by mouth at bedtime. Hold until linezolid course has finished What changed: additional instructions   artificial tears Oint ophthalmic ointment Commonly known as: LACRILUBE Place into both eyes every 4 (four) hours as needed for dry eyes.   baclofen 10 MG tablet Commonly known as: LIORESAL Take 1 tablet by mouth 3 (three) times daily.   bisacodyl 5 MG EC tablet Commonly known as: bisacodyl As directed What changed: Another medication with the same name was added. Make sure you understand how and when to take each.   bisacodyl 10 MG suppository Commonly known as: Dulcolax Place 1 suppository (10 mg total) rectally at bedtime. For 4 days only What changed: You were already taking a medication with the same name, and this prescription was added. Make sure you understand how and when to take each.   buPROPion 300 MG 24 hr tablet Commonly known as: WELLBUTRIN XL Take 300 mg by mouth daily.   cetirizine 10 MG tablet Commonly known as: ZYRTEC Take 10 mg  by mouth daily.   clonazePAM 1 MG tablet Commonly known as: KLONOPIN Take 1 mg by mouth 2 (two) times daily.   eucerin lotion Apply 1 Application topically every 12 (twelve) hours as needed for dry skin.   fluticasone 50 MCG/ACT nasal spray Commonly known as: FLONASE Place 1 spray into both nostrils daily.   hyoscyamine 0.375 MG 12 hr tablet Commonly known as: LEVBID TAKE (1) TABLET BYMOUTH EVERY TWELVE HOURS.   levETIRAcetam 250 MG tablet Commonly known as: KEPPRA Take 250 mg by mouth 2 (two) times daily.   linaclotide 145 MCG Caps capsule Commonly known as: LINZESS Take 1 capsule (145 mcg total) by mouth daily before breakfast. Increase to twice daily for next 4 days (09/11/22 thru 09/14/22), then back to once daily What changed:  additional instructions Another medication with the same name was removed. Continue taking this medication, and follow the directions you see here.   metFORMIN 500 MG tablet Commonly known as: GLUCOPHAGE Take 500 mg by mouth 2 (two) times daily.   modafinil 200 MG tablet Commonly known as: PROVIGIL Take 200 mg by mouth daily.   multivitamin with minerals Tabs tablet Take 1 tablet by mouth daily.   ondansetron 4 MG tablet Commonly known as: ZOFRAN Take 4 mg by mouth every 8 (eight) hours as needed for nausea or vomiting.   pantoprazole 40 MG tablet Commonly known as: PROTONIX Take 1 tablet (40 mg total) by mouth at bedtime.   polyethylene glycol 17 g packet Commonly known as: MIRALAX / GLYCOLAX Take 17 g by mouth 2 (two) times daily. What changed: when to take this   pravastatin 20 MG tablet Commonly known as: PRAVACHOL Take 20 mg by mouth daily.   pregabalin 75 MG capsule Commonly known as: LYRICA Take 75 mg by mouth daily.   pregabalin 150 MG capsule Commonly known as: LYRICA Take 150 mg by mouth at bedtime.   senna 8.6 MG tablet Commonly known as: SENOKOT Take 2 tablets (17.2 mg total) by mouth 2 (two) times daily. Hold  for loose stools >2/day What changed: how much to take   Vitamin D3 50 MCG (2000 UT) Tabs Take 2,000 Units by mouth daily.       Dispo: The patient is from: SNF              Anticipated d/c is to: SNF   With History of - Reviewed by me  Past  Medical History:  Diagnosis Date   Arthritis    Diabetes mellitus without complication (HCC)    Elevated LFTs    Elevated liver enzymes    GERD (gastroesophageal reflux disease)    Headache    MS (multiple sclerosis) (HCC)    Neurogenic bladder    Tremor       Past Surgical History:  Procedure Laterality Date   COLONOSCOPY  2014   COLONOSCOPY WITH PROPOFOL N/A 05/01/2022   Procedure: COLONOSCOPY WITH PROPOFOL;  Surgeon: Corbin Ade, MD;  Location: [facility];  Service: Endoscopy;  Laterality: N/A;  9:15am   HERNIA REPAIR     KNEE SURGERY     TESTICLE REMOVAL        Chief Complaint  Patient presents with   Abdominal Pain      HPI:    Vincent Anthony  is a 51 y.o. male with past medical history relevant for multiple sclerosis with neurogenic bowel, chronic headaches, DM2 who presents from SNF facility with concerns about somewhat increasing abdominal distention and less frequent BMs- No fever  Or chills  -No emesis -Patient is eating and drinking well --CT abdomen pelvis with contrast without acute findings today--- some concerns for ileus -Patient has spontaneous large BM in the ED today -Patient also had a small BM at facility prior to coming here today, and had a BM on 09/09/2022 as well -Patient with chronic bowel problems as outlined above- -bowel regimen adjusted,  please see discharge med rec and discharge instructions -Patient was previously evaluated by GI including Dr. Jena Gauss -UA not suggestive of UTI -No fevers or leukocytosis -Lipase WNL, LFTs are Not elevated - Abdominal exam is benign with very soft abdomen and good bowel sounds overall -Patient passing lots of flatus    Review of systems:    In  addition to the HPI above,   A full Review of  Systems was done, all other systems reviewed are negative except as noted above in HPI , .    Social History:  Reviewed by me    Social History   Tobacco Use   Smoking status: Never   Smokeless tobacco: Never  Substance Use Topics   Alcohol use: No    Comment: occasionally       Family History :  Reviewed by me    Family History  Problem Relation Age of Onset   Heart attack Mother    Diabetes Father    Hypertension Father    Colon cancer Neg Hx    Colonic polyp Neg Hx      Home Medications:   Prior to Admission medications   Medication Sig Start Date End Date Taking? Authorizing Provider  amitriptyline (ELAVIL) 10 MG tablet Take 1 tablet (10 mg total) by mouth at bedtime. Hold until linezolid course has finished Patient taking differently: Take 10 mg by mouth at bedtime. 01/06/21  Yes Pokhrel, Laxman, MD  artificial tears (LACRILUBE) OINT ophthalmic ointment Place into both eyes every 4 (four) hours as needed for dry eyes. 01/06/21  Yes Pokhrel, Laxman, MD  baclofen (LIORESAL) 10 MG tablet Take 1 tablet by mouth 3 (three) times daily. 11/09/14  Yes [provider]  bisacodyl (DULCOLAX) 10 MG suppository Place 1 suppository (10 mg total) rectally at bedtime. For 4 days only 09/10/22  Yes Tyliek Timberman, MD  buPROPion (WELLBUTRIN XL) 300 MG 24 hr tablet Take 300 mg by mouth daily. 09/08/22  Yes [provider]  cetirizine (ZYRTEC) 10 MG tablet  Take 10 mg by mouth daily.   Yes [provider]  Cholecalciferol (VITAMIN D3) 50 MCG (2000 UT) TABS Take 2,000 Units by mouth daily.   Yes [provider]  clonazePAM (KLONOPIN) 1 MG tablet Take 1 mg by mouth 2 (two) times daily.   Yes [provider]  Emollient (EUCERIN) lotion Apply 1 Application topically every 12 (twelve) hours as needed for dry skin.   Yes [provider]  fluticasone (FLONASE) 50 MCG/ACT nasal spray Place 1  spray into both nostrils daily.   Yes [provider]  hyoscyamine (LEVBID) 0.375 MG 12 hr tablet TAKE (1) TABLET BYMOUTH EVERY TWELVE HOURS. 09/16/20  Yes Gelene Mink, NP  levETIRAcetam (KEPPRA) 250 MG tablet Take 250 mg by mouth 2 (two) times daily. 11/09/14  Yes [provider]  metFORMIN (GLUCOPHAGE) 500 MG tablet Take 500 mg by mouth 2 (two) times daily.   Yes [provider]  modafinil (PROVIGIL) 200 MG tablet Take 200 mg by mouth daily.   Yes [provider]  Multiple Vitamin (MULTIVITAMIN WITH MINERALS) TABS tablet Take 1 tablet by mouth daily.   Yes [provider]  ondansetron (ZOFRAN) 4 MG tablet Take 4 mg by mouth every 8 (eight) hours as needed for nausea or vomiting.   Yes [provider]  pantoprazole (PROTONIX) 40 MG tablet Take 1 tablet (40 mg total) by mouth at bedtime. 01/06/21  Yes Pokhrel, Laxman, MD  pravastatin (PRAVACHOL) 20 MG tablet Take 20 mg by mouth daily.   Yes [provider]  pregabalin (LYRICA) 150 MG capsule Take 150 mg by mouth at bedtime. 02/06/22  Yes [provider]  pregabalin (LYRICA) 75 MG capsule Take 75 mg by mouth daily.   Yes [provider]  bisacodyl 5 MG EC tablet As directed 09/10/22   Shon Hale, MD  linaclotide (LINZESS) 145 MCG CAPS capsule Take 1 capsule (145 mcg total) by mouth daily before breakfast. Increase to twice daily for next 4 days (09/11/22 thru 09/14/22), then back to once daily 09/10/22   Shon Hale, MD  polyethylene glycol (MIRALAX / GLYCOLAX) 17 g packet Take 17 g by mouth 2 (two) times daily. 09/10/22   Shon Hale, MD  senna (SENOKOT) 8.6 MG tablet Take 2 tablets (17.2 mg total) by mouth 2 (two) times daily. Hold for loose stools >2/day 09/10/22   Shon Hale, MD     Allergies:     Allergies  Allergen Reactions   Celebrex [Celecoxib] Nausea And Vomiting   Clindamycin Diarrhea   Vioxx [Rofecoxib] Nausea And Vomiting    Zanaflex [Tizanidine Hcl] Nausea And Vomiting     Physical Exam:   Vitals  Blood pressure 128/85, pulse 77, temperature 98 F (36.7 C), temperature source Oral, resp. rate 13, height 6\' 3"  (1.905 m), weight 105.7 kg, SpO2 97 %.  Physical Examination: General appearance - alert,  in no distress  Mental status - alert, oriented to person, place, and time,  Eyes - sclera anicteric Neck - supple, no JVD elevation , Chest - clear  to auscultation bilaterally, symmetrical air movement,  Heart - S1 and S2 normal, regular  Abdomen - nontender, abdomen is very soft, small umbilical hernia noted , easily reducible and nontender  -I do not appreciate significant distention,  bowel sounds are really good, perianal rectal area examined during suppository insertion by RN without significant abnormality Neurological -chronic neuromuscular deficits consistent with patient's known multiple sclerosis, with right-sided deficit being worse than left Extremities -  no pedal edema noted, intact peripheral pulses  Skin - warm, dry     Data Review:    CBC Recent Labs  Lab 09/10/22 0903  WBC 8.2  HGB 12.4*  HCT 39.7  PLT 300  MCV 83.2  MCH 26.0  MCHC 31.2  RDW 16.2*  LYMPHSABS 1.1  MONOABS 0.6  EOSABS 0.1  BASOSABS 0.0   ------------------------------------------------------------------------------------------------------------------  Chemistries  Recent Labs  Lab 09/10/22 0903  NA 137  K 3.9  CL 99  CO2 27  GLUCOSE 103*  BUN 14  CREATININE 1.24  CALCIUM 9.4  AST 17  ALT 15  ALKPHOS 135*  BILITOT 0.5   ------------------------------------------------------------------------------------------------------------------ estimated creatinine clearance is 92.7 mL/min (by C-G formula based on SCr of 1.24 mg/dL). ------------------------------------------------------------------------------------------------------------------  Urinalysis    Component Value Date/Time   COLORURINE  STRAW (A) 09/10/2022 0942   APPEARANCEUR CLEAR 09/10/2022 0942   LABSPEC 1.005 09/10/2022 0942   PHURINE 6.0 09/10/2022 0942   GLUCOSEU NEGATIVE 09/10/2022 0942   HGBUR NEGATIVE 09/10/2022 0942   BILIRUBINUR NEGATIVE 09/10/2022 0942   BILIRUBINUR neg 08/06/2020 1331   KETONESUR NEGATIVE 09/10/2022 0942   PROTEINUR NEGATIVE 09/10/2022 0942   UROBILINOGEN 1.0 08/06/2020 1331   UROBILINOGEN 1.0 01/13/2011 2358   NITRITE NEGATIVE 09/10/2022 0942   LEUKOCYTESUR NEGATIVE 09/10/2022 0942    ----------------------------------------------------------------------------------------------------------------   Imaging Results:    CT Abdomen Pelvis W Contrast  Result Date: 09/10/2022 CLINICAL DATA:  Abdominal pain and distention EXAM: CT ABDOMEN AND PELVIS WITH CONTRAST TECHNIQUE: Multidetector CT imaging of the abdomen and pelvis was performed using the standard protocol following bolus administration of intravenous contrast. RADIATION DOSE REDUCTION: This exam was performed according to the departmental dose-optimization program which includes automated exposure control, adjustment of the mA and/or kV according to patient size and/or use of iterative reconstruction technique. CONTRAST:  OMNIPAQUE IOHEXOL 300 MG/ML  SOLN COMPARISON:  12/29/2020 FINDINGS: Lower chest: There are linear densities in the posterior lower lung fields suggesting subsegmental atelectasis. Hepatobiliary: No significant focal abnormalities are seen in liver. There is no dilation of bile ducts. Gallbladder is unremarkable. Pancreas: No focal abnormalities are seen. Spleen: Unremarkable. Adrenals/Urinary Tract: Adrenals are unremarkable. There is no hydronephrosis. There is 1 mm calculus in the lower pole of right kidney. Ureters are unremarkable. Urinary bladder is unremarkable. Stomach/Bowel: Stomach is not distended. Small bowel loops are not dilated. Appendix is unremarkable. There is gaseous distention of ascending and  transverse colon. Moderate to large amount of stool is seen in proximal colon. There is mild wall thickening in rectosigmoid. There is no pericolic stranding or fluid collection. Vascular/Lymphatic: There is tiny calcification in abdominal aorta. No significant lymphadenopathy seen. Reproductive: Unremarkable. Other: There no ascites or pneumoperitoneum. Musculoskeletal: No acute findings are seen. There is a ring-like structure in the left side of scrotum, possibly prosthetic left testis. IMPRESSION: There is no evidence of intestinal obstruction or pneumoperitoneum. There is no hydronephrosis. Appendix is unremarkable. There is mild diffuse wall thickening in rectum and sigmoid colon. This may suggest chronic nonspecific inflammation. Part of this finding may be due to incomplete distention. There is no pericolic stranding or abscess. There is moderate to marked gaseous distention of ascending and transverse colon, possibly suggesting ileus. There is 1 mm nonobstructing right renal stone. There are linear densities in posterior aspects of both lower lung fields suggesting subsegmental atelectasis. Electronically Signed   By: Ernie Avena M.D.   On: 09/10/2022 12:46    Radiological Exams on Admission: CT Abdomen  Pelvis W Contrast  Result Date: 09/10/2022 CLINICAL DATA:  Abdominal pain and distention EXAM: CT ABDOMEN AND PELVIS WITH CONTRAST TECHNIQUE: Multidetector CT imaging of the abdomen and pelvis was performed using the standard protocol following bolus administration of intravenous contrast. RADIATION DOSE REDUCTION: This exam was performed according to the departmental dose-optimization program which includes automated exposure control, adjustment of the mA and/or kV according to patient size and/or use of iterative reconstruction technique. CONTRAST:  OMNIPAQUE IOHEXOL 300 MG/ML  SOLN COMPARISON:  12/29/2020 FINDINGS: Lower chest: There are linear densities in the posterior lower lung  fields suggesting subsegmental atelectasis. Hepatobiliary: No significant focal abnormalities are seen in liver. There is no dilation of bile ducts. Gallbladder is unremarkable. Pancreas: No focal abnormalities are seen. Spleen: Unremarkable. Adrenals/Urinary Tract: Adrenals are unremarkable. There is no hydronephrosis. There is 1 mm calculus in the lower pole of right kidney. Ureters are unremarkable. Urinary bladder is unremarkable. Stomach/Bowel: Stomach is not distended. Small bowel loops are not dilated. Appendix is unremarkable. There is gaseous distention of ascending and transverse colon. Moderate to large amount of stool is seen in proximal colon. There is mild wall thickening in rectosigmoid. There is no pericolic stranding or fluid collection. Vascular/Lymphatic: There is tiny calcification in abdominal aorta. No significant lymphadenopathy seen. Reproductive: Unremarkable. Other: There no ascites or pneumoperitoneum. Musculoskeletal: No acute findings are seen. There is a ring-like structure in the left side of scrotum, possibly prosthetic left testis. IMPRESSION: There is no evidence of intestinal obstruction or pneumoperitoneum. There is no hydronephrosis. Appendix is unremarkable. There is mild diffuse wall thickening in rectum and sigmoid colon. This may suggest chronic nonspecific inflammation. Part of this finding may be due to incomplete distention. There is no pericolic stranding or abscess. There is moderate to marked gaseous distention of ascending and transverse colon, possibly suggesting ileus. There is 1 mm nonobstructing right renal stone. There are linear densities in posterior aspects of both lower lung fields suggesting subsegmental atelectasis. Electronically Signed   By: Ernie Avena M.D.   On: 09/10/2022 12:46    Condition  -stable  Shon Hale M.D on 09/10/2022 at 2:25 PM Go to [email or web address] -  for contact info  [facility] - Office  [phone]

## 2022-09-10 NOTE — ED Provider Notes (Signed)
[facility] EMERGENCY DEPARTMENT Provider Note   CSN: [id number] Arrival date & time: 09/10/22  0846     History  Chief Complaint  Patient presents with   Abdominal Pain    Vincent Anthony is a 51 y.o. male. With past medical history of T2DM, GERD, MS who presents to the emergency department with abdominal pain.  States that about 2 days open his wife noted that his abdomen seemed more rounded.  The patient states that he mentioned this to the nursing director at his nursing facility.  Nursing contacted the provider who reportedly obtained an XR showing a bowel obstruction. He states that he last had a bowel movement this morning which was small. He also received an enema last night will small amount of output. States that he is usually on Senokot at baseline. He is passing flatus. He denies having nausea or vomiting. Denies abdominal pain. Denies previous surgeries to the abdomen.    Abdominal Pain Associated symptoms: no nausea and no vomiting         Home Medications Prior to Admission medications   Medication Sig Start Date End Date Taking? Authorizing Provider  amitriptyline (ELAVIL) 10 MG tablet Take 1 tablet (10 mg total) by mouth at bedtime. Hold until linezolid course has finished Patient taking differently: Take 10 mg by mouth at bedtime. 01/06/21  Yes Pokhrel, Laxman, MD  artificial tears (LACRILUBE) OINT ophthalmic ointment Place into both eyes every 4 (four) hours as needed for dry eyes. 01/06/21  Yes Pokhrel, Laxman, MD  baclofen (LIORESAL) 10 MG tablet Take 1 tablet by mouth 3 (three) times daily. 11/09/14  Yes [provider]  bisacodyl 5 MG EC tablet As directed 04/15/22  Yes Rourk, Gerrit Friends, MD  buPROPion (WELLBUTRIN XL) 300 MG 24 hr tablet Take 300 mg by mouth daily. 09/08/22  Yes [provider]  cetirizine (ZYRTEC) 10 MG tablet Take 10 mg by mouth daily.   Yes [provider]  Cholecalciferol (VITAMIN D3) 50 MCG (2000 UT) TABS Take  2,000 Units by mouth daily.   Yes [provider]  clonazePAM (KLONOPIN) 1 MG tablet Take 1 mg by mouth 2 (two) times daily.   Yes [provider]  Emollient (EUCERIN) lotion Apply 1 Application topically every 12 (twelve) hours as needed for dry skin.   Yes [provider]  fluticasone (FLONASE) 50 MCG/ACT nasal spray Place 1 spray into both nostrils daily.   Yes [provider]  hyoscyamine (LEVBID) 0.375 MG 12 hr tablet TAKE (1) TABLET BYMOUTH EVERY TWELVE HOURS. 09/16/20  Yes Gelene Mink, NP  levETIRAcetam (KEPPRA) 250 MG tablet Take 250 mg by mouth 2 (two) times daily. 11/09/14  Yes [provider]  linaclotide Karlene Einstein) 145 MCG CAPS capsule Take 1 capsule (145 mcg total) by mouth daily before breakfast. Skip a dose if diarrhoea (loose stools)>2/times a day 01/06/21  Yes Pokhrel, Laxman, MD  metFORMIN (GLUCOPHAGE) 500 MG tablet Take 500 mg by mouth 2 (two) times daily.   Yes [provider]  modafinil (PROVIGIL) 200 MG tablet Take 200 mg by mouth daily.   Yes [provider]  Multiple Vitamin (MULTIVITAMIN WITH MINERALS) TABS tablet Take 1 tablet by mouth daily.   Yes [provider]  ondansetron (ZOFRAN) 4 MG tablet Take 4 mg by mouth every 8 (eight) hours as needed for nausea or vomiting.   Yes [provider]  pantoprazole (PROTONIX) 40 MG tablet Take 1 tablet (40 mg total) by mouth at  bedtime. 01/06/21  Yes Pokhrel, Laxman, MD  polyethylene glycol (MIRALAX / GLYCOLAX) 17 g packet Take 17 g by mouth daily.   Yes [provider]  pravastatin (PRAVACHOL) 20 MG tablet Take 20 mg by mouth daily.   Yes [provider]  pregabalin (LYRICA) 150 MG capsule Take 150 mg by mouth at bedtime. 02/06/22  Yes [provider]  pregabalin (LYRICA) 75 MG capsule Take 75 mg by mouth daily.   Yes [provider]  senna (SENOKOT) 8.6 MG tablet Take 2 tablets (17.2 mg total) by mouth 2 (two) times  daily. Hold for loose stools >2/day Patient taking differently: Take 1 tablet by mouth 2 (two) times daily. Hold for loose stools >2/day 01/06/21  Yes Pokhrel, Rebekah Chesterfield, MD  linaclotide (LINZESS) 290 MCG CAPS capsule Take 1 capsule (290 mcg total) by mouth daily before breakfast. Patient not taking: Reported on 09/10/2022 04/15/22   Rourk, Gerrit Friends, MD  simethicone (MYLICON) 40 MG/0.6ML drops Take 0.6 mLs (40 mg total) by mouth 4 (four) times daily for 10 days. Patient not taking: Reported on 04/28/2022 01/06/21 01/16/21  Joycelyn Das, MD      Allergies    Celebrex [celecoxib], Clindamycin, Vioxx [rofecoxib], and Zanaflex [tizanidine hcl]    Review of Systems   Review of Systems  Gastrointestinal:  Positive for abdominal distention and abdominal pain. Negative for nausea and vomiting.  All other systems reviewed and are negative.   Physical Exam Updated Vital Signs BP 127/80   Pulse 70   Temp 98 F (36.7 C) (Oral)   Resp 12   Ht 6\' 3"  (1.905 m)   Wt 105.7 kg   SpO2 98%   BMI 29.12 kg/m  Physical Exam Vitals and nursing note reviewed.  Constitutional:      General: He is not in acute distress.    Appearance: Normal appearance. He is obese. He is not ill-appearing or toxic-appearing.  HENT:     Head: Normocephalic.     Mouth/Throat:     Mouth: Mucous membranes are moist.     Pharynx: Oropharynx is clear.  Eyes:     General: No scleral icterus. Cardiovascular:     Rate and Rhythm: Normal rate and regular rhythm.     Heart sounds: Normal heart sounds. No murmur heard. Pulmonary:     Effort: Pulmonary effort is normal. No respiratory distress.     Breath sounds: Normal breath sounds.  Abdominal:     General: Abdomen is protuberant. Bowel sounds are decreased. There is distension.     Palpations: Abdomen is soft.     Tenderness: There is no abdominal tenderness.     Comments: Tympanic percussion   Skin:    General: Skin is warm and dry.     Capillary Refill: Capillary  refill takes less than 2 seconds.  Neurological:     General: No focal deficit present.     Mental Status: He is alert and oriented to person, place, and time.  Psychiatric:        Mood and Affect: Mood normal.        Behavior: Behavior normal.     ED Results / Procedures / Treatments   Labs (all labs ordered are listed, but only abnormal results are displayed) Labs Reviewed  BASIC METABOLIC PANEL - Abnormal; Notable for the following components:      Result Value   Glucose, Bld 103 (*)    All other components within normal limits  HEPATIC FUNCTION PANEL - Abnormal;  Notable for the following components:   Alkaline Phosphatase 135 (*)    All other components within normal limits  CBC WITH DIFFERENTIAL/PLATELET - Abnormal; Notable for the following components:   Hemoglobin 12.4 (*)    RDW 16.2 (*)    All other components within normal limits  URINALYSIS, ROUTINE W REFLEX MICROSCOPIC - Abnormal; Notable for the following components:   Color, Urine STRAW (*)    All other components within normal limits  LIPASE, BLOOD  CBG MONITORING, ED    EKG None  Radiology CT Abdomen Pelvis W Contrast  Result Date: 09/10/2022 CLINICAL DATA:  Abdominal pain and distention EXAM: CT ABDOMEN AND PELVIS WITH CONTRAST TECHNIQUE: Multidetector CT imaging of the abdomen and pelvis was performed using the standard protocol following bolus administration of intravenous contrast. RADIATION DOSE REDUCTION: This exam was performed according to the departmental dose-optimization program which includes automated exposure control, adjustment of the mA and/or kV according to patient size and/or use of iterative reconstruction technique. CONTRAST:  OMNIPAQUE IOHEXOL 300 MG/ML  SOLN COMPARISON:  12/29/2020 FINDINGS: Lower chest: There are linear densities in the posterior lower lung fields suggesting subsegmental atelectasis. Hepatobiliary: No significant focal abnormalities are seen in liver. There is no  dilation of bile ducts. Gallbladder is unremarkable. Pancreas: No focal abnormalities are seen. Spleen: Unremarkable. Adrenals/Urinary Tract: Adrenals are unremarkable. There is no hydronephrosis. There is 1 mm calculus in the lower pole of right kidney. Ureters are unremarkable. Urinary bladder is unremarkable. Stomach/Bowel: Stomach is not distended. Small bowel loops are not dilated. Appendix is unremarkable. There is gaseous distention of ascending and transverse colon. Moderate to large amount of stool is seen in proximal colon. There is mild wall thickening in rectosigmoid. There is no pericolic stranding or fluid collection. Vascular/Lymphatic: There is tiny calcification in abdominal aorta. No significant lymphadenopathy seen. Reproductive: Unremarkable. Other: There no ascites or pneumoperitoneum. Musculoskeletal: No acute findings are seen. There is a ring-like structure in the left side of scrotum, possibly prosthetic left testis. IMPRESSION: There is no evidence of intestinal obstruction or pneumoperitoneum. There is no hydronephrosis. Appendix is unremarkable. There is mild diffuse wall thickening in rectum and sigmoid colon. This may suggest chronic nonspecific inflammation. Part of this finding may be due to incomplete distention. There is no pericolic stranding or abscess. There is moderate to marked gaseous distention of ascending and transverse colon, possibly suggesting ileus. There is 1 mm nonobstructing right renal stone. There are linear densities in posterior aspects of both lower lung fields suggesting subsegmental atelectasis. Electronically Signed   By: Ernie Avena M.D.   On: 09/10/2022 12:46    Procedures Procedures    Medications Ordered in ED Medications  polyethylene glycol (MIRALAX / GLYCOLAX) packet 17 g (17 g Oral Given 09/10/22 1346)  iohexol (OMNIPAQUE) 300 MG/ML solution 100 mL (100 mLs Intravenous Contrast Given 09/10/22 1146)  senna-docusate (Senokot-S) tablet  2 tablet (2 tablets Oral Given 09/10/22 1347)  bisacodyl (DULCOLAX) suppository 10 mg (10 mg Rectal Given 09/10/22 1346)  alum & mag hydroxide-simeth (MAALOX/MYLANTA) 200-200-20 MG/5ML suspension 30 mL (30 mLs Oral Given 09/10/22 1347)  linaclotide (LINZESS) capsule 290 mcg (290 mcg Oral Given 09/10/22 1347)    ED Course/ Medical Decision Making/ A&P                           Medical Decision Making Amount and/or Complexity of Data Reviewed Labs: ordered. Radiology: ordered.  Risk Prescription drug management. Decision regarding  hospitalization.  Initial Impression and Ddx 51 year old male who presents with abdominal pain. On my exam his abdomen does appear slightly distended and tympanic. I do not have a baseline to compare but he subjectively feels mildly distended. The abdomen is soft and non-tender. He has no other complaints. Not having N/V. Will obtain basic labs and CT abdomen pelvis to identified if he indeed has obstruction and details. Patient PMH that increases complexity of ED encounter:  MS, GERD, DM, neurogenic bladder Differential includes: SBO, colonic obstruction, mass, ileus 2/2 MS, perforation, etc.   Interpretation of Diagnostics I independent reviewed and interpreted the labs as followed: BMP without electrolyte derangement, CBC stable, lipase negative, UA negative  - I independently visualized the following imaging with scope of interpretation limited to determining acute life threatening conditions related to emergency care: CT abdomen pelvis with contrast, which revealed ileus  Patient Reassessment and Ultimate Disposition/Management 51 year old male who presents to the emergency department with abdominal distention.  He subsequently had bowel movement here in the emergency department. He has evidence of ileus on his CT exam.  No evidence of obstruction.  He is not having nausea or vomiting.  He is tolerating p.o.  No other intra-abdominal pathology noted.  I  consulted and spoke with Dr. Mariea Clonts, hospitalist who came and evaluated the patient.  He feels that he is appropriate to be discharged back to his facility with altering of his medications which he is going to facilitate.  Hospitalist will discharge the patient.  Patient is agreeable to this.  This is all likely related to his MS and neurogenic bowel.  The patient has been appropriately medically screened and/or stabilized in the ED. I have low suspicion for any other emergent medical condition which would require further screening, evaluation or treatment in the ED or require inpatient management. At time of discharge the patient is hemodynamically stable and in no acute distress. I have discussed work-up results and diagnosis with patient and answered all questions. Patient is agreeable with discharge plan. We discussed strict return precautions for returning to the emergency department and they verbalized understanding.     Patient management required discussion with the following services or consulting groups:  Hospitalist Service  Complexity of Problems Addressed Chronic illness with exacerbation  Additional Data Reviewed and Analyzed Further history obtained from: Past medical history and medications listed in the EMR, Care Everywhere, Prior labs/imaging results, and Records from care facility  Patient Encounter Risk Assessment Consideration of hospitalization  Final Clinical Impression(s) / ED Diagnoses Final diagnoses:  Ileus [facility])    Rx / DC Orders ED Discharge Orders     None         Cristopher Peru, PA-C 09/10/22 1402    Glendora Score, MD 09/10/22 1825

## 2022-11-26 ENCOUNTER — Encounter: Payer: Self-pay | Admitting: Radiology

## 2023-02-03 DIAGNOSIS — K219 Gastro-esophageal reflux disease without esophagitis: Secondary | ICD-10-CM | POA: Diagnosis not present

## 2023-02-03 DIAGNOSIS — E782 Mixed hyperlipidemia: Secondary | ICD-10-CM | POA: Diagnosis not present

## 2023-02-03 DIAGNOSIS — E559 Vitamin D deficiency, unspecified: Secondary | ICD-10-CM | POA: Diagnosis not present

## 2023-02-09 DIAGNOSIS — F419 Anxiety disorder, unspecified: Secondary | ICD-10-CM | POA: Diagnosis not present

## 2023-02-09 DIAGNOSIS — G35 Multiple sclerosis: Secondary | ICD-10-CM | POA: Diagnosis not present

## 2023-02-09 DIAGNOSIS — E119 Type 2 diabetes mellitus without complications: Secondary | ICD-10-CM | POA: Diagnosis not present

## 2023-02-09 DIAGNOSIS — F32A Depression, unspecified: Secondary | ICD-10-CM | POA: Diagnosis not present

## 2023-02-10 DIAGNOSIS — F4323 Adjustment disorder with mixed anxiety and depressed mood: Secondary | ICD-10-CM | POA: Diagnosis not present

## 2023-02-10 DIAGNOSIS — F411 Generalized anxiety disorder: Secondary | ICD-10-CM | POA: Diagnosis not present

## 2023-02-10 DIAGNOSIS — R21 Rash and other nonspecific skin eruption: Secondary | ICD-10-CM | POA: Diagnosis not present

## 2023-02-17 DIAGNOSIS — F411 Generalized anxiety disorder: Secondary | ICD-10-CM | POA: Diagnosis not present

## 2023-02-19 DIAGNOSIS — G35 Multiple sclerosis: Secondary | ICD-10-CM | POA: Diagnosis not present

## 2023-02-22 DIAGNOSIS — E1140 Type 2 diabetes mellitus with diabetic neuropathy, unspecified: Secondary | ICD-10-CM | POA: Diagnosis not present

## 2023-02-22 DIAGNOSIS — K5909 Other constipation: Secondary | ICD-10-CM | POA: Diagnosis not present

## 2023-02-22 DIAGNOSIS — G40909 Epilepsy, unspecified, not intractable, without status epilepticus: Secondary | ICD-10-CM | POA: Diagnosis not present

## 2023-03-02 DIAGNOSIS — Z760 Encounter for issue of repeat prescription: Secondary | ICD-10-CM | POA: Diagnosis not present

## 2023-03-02 DIAGNOSIS — G629 Polyneuropathy, unspecified: Secondary | ICD-10-CM | POA: Diagnosis not present

## 2023-03-02 DIAGNOSIS — Z79899 Other long term (current) drug therapy: Secondary | ICD-10-CM | POA: Diagnosis not present

## 2023-03-02 DIAGNOSIS — K5909 Other constipation: Secondary | ICD-10-CM | POA: Diagnosis not present

## 2023-03-02 DIAGNOSIS — F419 Anxiety disorder, unspecified: Secondary | ICD-10-CM | POA: Diagnosis not present

## 2023-03-02 DIAGNOSIS — E559 Vitamin D deficiency, unspecified: Secondary | ICD-10-CM | POA: Diagnosis not present

## 2023-03-03 DIAGNOSIS — K5909 Other constipation: Secondary | ICD-10-CM | POA: Diagnosis not present

## 2023-03-03 DIAGNOSIS — E1169 Type 2 diabetes mellitus with other specified complication: Secondary | ICD-10-CM | POA: Diagnosis not present

## 2023-03-03 DIAGNOSIS — G40909 Epilepsy, unspecified, not intractable, without status epilepticus: Secondary | ICD-10-CM | POA: Diagnosis not present

## 2023-03-04 DIAGNOSIS — E119 Type 2 diabetes mellitus without complications: Secondary | ICD-10-CM | POA: Diagnosis not present

## 2023-03-04 DIAGNOSIS — G35 Multiple sclerosis: Secondary | ICD-10-CM | POA: Diagnosis not present

## 2023-03-04 DIAGNOSIS — F32A Depression, unspecified: Secondary | ICD-10-CM | POA: Diagnosis not present

## 2023-03-04 DIAGNOSIS — F419 Anxiety disorder, unspecified: Secondary | ICD-10-CM | POA: Diagnosis not present

## 2023-03-08 DIAGNOSIS — R197 Diarrhea, unspecified: Secondary | ICD-10-CM | POA: Diagnosis not present

## 2023-03-08 DIAGNOSIS — R1110 Vomiting, unspecified: Secondary | ICD-10-CM | POA: Diagnosis not present

## 2023-03-08 DIAGNOSIS — D649 Anemia, unspecified: Secondary | ICD-10-CM | POA: Diagnosis not present

## 2023-03-08 DIAGNOSIS — R110 Nausea: Secondary | ICD-10-CM | POA: Diagnosis not present

## 2023-03-09 DIAGNOSIS — K5909 Other constipation: Secondary | ICD-10-CM | POA: Diagnosis not present

## 2023-03-09 DIAGNOSIS — R112 Nausea with vomiting, unspecified: Secondary | ICD-10-CM | POA: Diagnosis not present

## 2023-03-09 DIAGNOSIS — D649 Anemia, unspecified: Secondary | ICD-10-CM | POA: Diagnosis not present

## 2023-03-09 DIAGNOSIS — K219 Gastro-esophageal reflux disease without esophagitis: Secondary | ICD-10-CM | POA: Diagnosis not present

## 2023-03-09 DIAGNOSIS — M62838 Other muscle spasm: Secondary | ICD-10-CM | POA: Diagnosis not present

## 2023-03-13 DIAGNOSIS — R109 Unspecified abdominal pain: Secondary | ICD-10-CM | POA: Diagnosis not present

## 2023-03-22 ENCOUNTER — Telehealth: Payer: Self-pay | Admitting: Family Medicine

## 2023-03-22 NOTE — Telephone Encounter (Signed)
Erroneous encounter. Please disregard.

## 2023-03-23 DIAGNOSIS — H524 Presbyopia: Secondary | ICD-10-CM | POA: Diagnosis not present

## 2023-03-26 DIAGNOSIS — E1169 Type 2 diabetes mellitus with other specified complication: Secondary | ICD-10-CM | POA: Diagnosis not present

## 2023-03-26 DIAGNOSIS — G40909 Epilepsy, unspecified, not intractable, without status epilepticus: Secondary | ICD-10-CM | POA: Diagnosis not present

## 2023-03-26 DIAGNOSIS — K5900 Constipation, unspecified: Secondary | ICD-10-CM | POA: Diagnosis not present

## 2023-03-26 DIAGNOSIS — G894 Chronic pain syndrome: Secondary | ICD-10-CM | POA: Diagnosis not present

## 2023-03-31 DIAGNOSIS — E782 Mixed hyperlipidemia: Secondary | ICD-10-CM | POA: Diagnosis not present

## 2023-03-31 DIAGNOSIS — K219 Gastro-esophageal reflux disease without esophagitis: Secondary | ICD-10-CM | POA: Diagnosis not present

## 2023-03-31 DIAGNOSIS — E559 Vitamin D deficiency, unspecified: Secondary | ICD-10-CM | POA: Diagnosis not present

## 2023-04-05 DIAGNOSIS — F419 Anxiety disorder, unspecified: Secondary | ICD-10-CM | POA: Diagnosis not present

## 2023-04-05 DIAGNOSIS — E119 Type 2 diabetes mellitus without complications: Secondary | ICD-10-CM | POA: Diagnosis not present

## 2023-04-05 DIAGNOSIS — G35 Multiple sclerosis: Secondary | ICD-10-CM | POA: Diagnosis not present

## 2023-04-05 DIAGNOSIS — F32A Depression, unspecified: Secondary | ICD-10-CM | POA: Diagnosis not present

## 2023-04-09 ENCOUNTER — Encounter: Payer: Self-pay | Admitting: *Deleted

## 2023-04-09 DIAGNOSIS — F419 Anxiety disorder, unspecified: Secondary | ICD-10-CM | POA: Diagnosis not present

## 2023-04-09 DIAGNOSIS — F331 Major depressive disorder, recurrent, moderate: Secondary | ICD-10-CM | POA: Diagnosis not present

## 2023-04-14 DIAGNOSIS — G35 Multiple sclerosis: Secondary | ICD-10-CM | POA: Diagnosis not present

## 2023-04-14 DIAGNOSIS — R252 Cramp and spasm: Secondary | ICD-10-CM | POA: Diagnosis not present

## 2023-04-16 DIAGNOSIS — F411 Generalized anxiety disorder: Secondary | ICD-10-CM | POA: Diagnosis not present

## 2023-04-21 DIAGNOSIS — L600 Ingrowing nail: Secondary | ICD-10-CM | POA: Diagnosis not present

## 2023-04-21 DIAGNOSIS — M79675 Pain in left toe(s): Secondary | ICD-10-CM | POA: Diagnosis not present

## 2023-04-21 DIAGNOSIS — M79674 Pain in right toe(s): Secondary | ICD-10-CM | POA: Diagnosis not present

## 2023-04-23 DIAGNOSIS — G894 Chronic pain syndrome: Secondary | ICD-10-CM | POA: Diagnosis not present

## 2023-05-03 ENCOUNTER — Telehealth: Payer: Self-pay

## 2023-05-03 NOTE — Telephone Encounter (Signed)
Please call [facility] and arrange an appt with transportation for pt to come in. Pt is on recall for a flex sig.

## 2023-05-07 DIAGNOSIS — F411 Generalized anxiety disorder: Secondary | ICD-10-CM | POA: Diagnosis not present

## 2023-05-10 DIAGNOSIS — E1143 Type 2 diabetes mellitus with diabetic autonomic (poly)neuropathy: Secondary | ICD-10-CM | POA: Diagnosis not present

## 2023-05-10 DIAGNOSIS — K5909 Other constipation: Secondary | ICD-10-CM | POA: Diagnosis not present

## 2023-05-10 DIAGNOSIS — R569 Unspecified convulsions: Secondary | ICD-10-CM | POA: Diagnosis not present

## 2023-05-17 ENCOUNTER — Encounter: Payer: Self-pay | Admitting: Gastroenterology

## 2023-05-17 ENCOUNTER — Ambulatory Visit (INDEPENDENT_AMBULATORY_CARE_PROVIDER_SITE_OTHER): Payer: Medicare HMO | Admitting: Gastroenterology

## 2023-05-17 VITALS — BP 115/73 | HR 78 | Temp 98.6°F | Ht 75.0 in | Wt 241.0 lb

## 2023-05-17 DIAGNOSIS — Z1211 Encounter for screening for malignant neoplasm of colon: Secondary | ICD-10-CM

## 2023-05-17 DIAGNOSIS — K219 Gastro-esophageal reflux disease without esophagitis: Secondary | ICD-10-CM | POA: Diagnosis not present

## 2023-05-17 DIAGNOSIS — R1084 Generalized abdominal pain: Secondary | ICD-10-CM | POA: Diagnosis not present

## 2023-05-17 DIAGNOSIS — K5904 Chronic idiopathic constipation: Secondary | ICD-10-CM | POA: Diagnosis not present

## 2023-05-17 NOTE — Progress Notes (Signed)
GI Office Note    Referring Provider: Kerri Perches, MD Primary Care Physician:  Kerri Perches, MD Primary Gastroenterologist: Gerrit Friends.Rourk, MD  Date:  05/17/2023  ID:  Vincent Anthony, DOB 04-Jan-1971, MRN [id number]   Chief Complaint   Chief Complaint  Patient presents with   Follow-up    Follow up for colonoscopy. Prep not good   History of Present Illness  Vincent Anthony is a 52 y.o. male with a history of follow up. He was originally seen for consult of elevated LFTs and generalized abdominal pain.   He was previously on Gilenya for his MS which is known to potentially increase LFTs by 10-15%. PCP felt like his elevation was secondary to medication effect. Prior workup negative for viral etiology, ANA mildly elevated with titer 1:80 but not surprisingly given he has MS. Other autoimmune labs negative. RUQ U/S most consistent with fatty liver.   Prior to consultation with [facility] he was seen by a GI at [facility] and was being treated with hycosamine for abdominal cramping. He had began taking his Gilenya every other day and continued to have an elevation in LFTs with normal bilirubin. Medication adjustments deferred to neurology. He had multiple checks of his LFTs revealing some normalization of LFTs and then another bump including increase in Alk Phos. His last in person office visit was 01/05/17 and was no show for appointment in July 2018.   Last seen via video visit 07/10/20 for follow up and medication refills. At this visit he reported that he was doing fairly well in regards to his LFTs and MS. He was on Ocrevus IV. His abdominal pain and colon spasms were controlled on hycosamine BID. He denied any other GI complaints or URI symptoms. He had recently been following with ortho due to knee and ankle sprains post fall x2.    Last colonoscopy 08/22/2013 with [facility]). Normal.   Last LFTs in system from 01/30/21 - AST 19, ALT 27, Alk Phos 74, T Bili  0.6. No recent labs in Care Everywhere.   Last office visit 03/02/2022.  Patient denied any changes in bowel habits, melena, bright BPR.  Was on Linzess 145 mcg daily and Senokot and having a bowel movement every other day.  Abdominal pain controlled with Levbid.  Denies any weight loss, early satiety, or lack of appetite.  Reflux well-controlled on pantoprazole 40 mg daily.  Scheduled for colonoscopy with Dr. Jena Gauss.  Advised to continue Linzess and Senokot prior to procedure.  Continue pantoprazole 40 mg once daily.  Colonoscopy 05/01/2022: -Poor prep -Markedly capacious, redundant, and elongated colon -Rectum and sigmoid segments not seen well -Advised flexible sigmoidoscopy in 1 year  Today:  States in December he was told he had a blockage in his bowels. He states he was taken off Linzess then given his prior ileus. Denies any overt abdominal pain but mild discomfort he experiences it gets better with BM. Denies any melena or brbpr.   No symptoms on pantoprazole. No dysphagia.   He is taking 2 pills in the morning (a pink pill and a brown pill) takes 1 of the pink pills in the evening.  Per review of his records it appears he is taking Amitiza twice daily along with sennakot nightly. Having a BM every other day, sometimes twice a day. Possibly still having some hard stools in between as well. Also per review of his records it appears he is tacking Levsin twice daily fairly frequently however  there have been some days where he does not take it at all.   Doing PT/OT for the last 2 weeks.   Current Outpatient Medications  Medication Sig Dispense Refill   amitriptyline (ELAVIL) 10 MG tablet Take 1 tablet (10 mg total) by mouth at bedtime. Hold until linezolid course has finished (Patient taking differently: Take 10 mg by mouth at bedtime.)     artificial tears (LACRILUBE) OINT ophthalmic ointment Place into both eyes every 4 (four) hours as needed for dry eyes.     baclofen (LIORESAL) 10 MG tablet  Take 1 tablet by mouth 3 (three) times daily.     bisacodyl (DULCOLAX) 10 MG suppository Place 1 suppository (10 mg total) rectally at bedtime. For 4 days only 4 suppository 0   bisacodyl 5 MG EC tablet As directed 30 tablet 3   buPROPion (WELLBUTRIN XL) 300 MG 24 hr tablet Take 300 mg by mouth daily.     cetirizine (ZYRTEC) 10 MG tablet Take 10 mg by mouth daily.     Cholecalciferol (VITAMIN D3) 50 MCG (2000 UT) TABS Take 2,000 Units by mouth daily.     clonazePAM (KLONOPIN) 1 MG tablet Take 1 mg by mouth 2 (two) times daily.     Emollient (EUCERIN) lotion Apply 1 Application topically every 12 (twelve) hours as needed for dry skin.     fluticasone (FLONASE) 50 MCG/ACT nasal spray Place 1 spray into both nostrils daily.     hyoscyamine (LEVBID) 0.375 MG 12 hr tablet TAKE (1) TABLET BYMOUTH EVERY TWELVE HOURS. 60 tablet 11   levETIRAcetam (KEPPRA) 250 MG tablet Take 250 mg by mouth 2 (two) times daily.     lubiprostone (AMITIZA) 8 MCG capsule Take by mouth.     metFORMIN (GLUCOPHAGE) 500 MG tablet Take 500 mg by mouth 2 (two) times daily.     modafinil (PROVIGIL) 200 MG tablet Take 200 mg by mouth daily.     Multiple Vitamin (MULTIVITAMIN WITH MINERALS) TABS tablet Take 1 tablet by mouth daily.     ondansetron (ZOFRAN) 4 MG tablet Take 4 mg by mouth every 8 (eight) hours as needed for nausea or vomiting.     pantoprazole (PROTONIX) 40 MG tablet Take 1 tablet (40 mg total) by mouth at bedtime. 30 tablet    polyethylene glycol (MIRALAX / GLYCOLAX) 17 g packet Take 17 g by mouth 2 (two) times daily. 60 each 4   pravastatin (PRAVACHOL) 20 MG tablet Take 20 mg by mouth daily.     pregabalin (LYRICA) 150 MG capsule Take 150 mg by mouth at bedtime.     pregabalin (LYRICA) 75 MG capsule Take 75 mg by mouth daily.     senna (SENOKOT) 8.6 MG tablet Take 2 tablets (17.2 mg total) by mouth 2 (two) times daily. Hold for loose stools >2/day 120 tablet 2   No current facility-administered medications for  this visit.    Past Medical History:  Diagnosis Date   Arthritis    Diabetes mellitus without complication (HCC)    Elevated LFTs    Elevated liver enzymes    GERD (gastroesophageal reflux disease)    Headache    MS (multiple sclerosis) (HCC)    Neurogenic bladder    Tremor     Past Surgical History:  Procedure Laterality Date   COLONOSCOPY  2014   COLONOSCOPY WITH PROPOFOL N/A 05/01/2022   Procedure: COLONOSCOPY WITH PROPOFOL;  Surgeon: Corbin Ade, MD;  Location: [facility];  Service: Endoscopy;  Laterality: N/A;  9:15am   HERNIA REPAIR     KNEE SURGERY     TESTICLE REMOVAL      Family History  Problem Relation Age of Onset   Heart attack Mother    Diabetes Father    Hypertension Father    Colon cancer Neg Hx    Colonic polyp Neg Hx     Allergies as of 05/17/2023 - Review Complete 05/17/2023  Allergen Reaction Noted   Celebrex [celecoxib] Nausea And Vomiting 03/04/2013   Clindamycin Diarrhea 09/14/2012   Vioxx [rofecoxib] Nausea And Vomiting 03/04/2013   Zanaflex [tizanidine hcl] Nausea And Vomiting 11/30/2014    Social History   Socioeconomic History   Marital status: Married    Spouse name: Not on file   Number of children: Not on file   Years of education: Not on file   Highest education level: Not on file  Occupational History   Not on file  Tobacco Use   Smoking status: Never   Smokeless tobacco: Never  Substance and Sexual Activity   Alcohol use: No    Comment: occasionally   Drug use: No   Sexual activity: Not on file  Other Topics Concern   Not on file  Social History Narrative   Not on file   Social Determinants of Health   Financial Resource Strain: Not on file  Food Insecurity: Not on file  Transportation Needs: Not on file  Physical Activity: Not on file  Stress: Not on file  Social Connections: Not on file   Review of Systems   Gen: Denies fever, chills, anorexia. Denies fatigue, weakness, weight loss.  CV: Denies chest  pain, palpitations, syncope, peripheral edema, and claudication. Resp: Denies dyspnea at rest, cough, wheezing, coughing up blood, and pleurisy. GI: See HPI Derm: Denies rash, itching, dry skin Psych: Denies depression, anxiety, memory loss, confusion. No homicidal or suicidal ideation.  Heme: Denies bruising, bleeding, and enlarged lymph nodes.  Physical Exam   BP 115/73   Pulse 78   Temp 98.6 F (37 C)   Ht 6\' 3"  (1.905 m)   Wt 241 lb (109.3 kg) Comment: pt was weighed last week per pt  BMI 30.12 kg/m   General:   Alert and oriented. No distress noted. Pleasant and cooperative.  Head:  Normocephalic and atraumatic. Eyes:  Conjuctiva clear without scleral icterus. Mouth:  Oral mucosa pink and moist. Good dentition. No lesions. Lungs:  Clear to auscultation bilaterally. No wheezes, rales, or rhonchi. No distress.  Heart:  S1, S2 present without murmurs appreciated.  Abdomen:  +BS, soft, non-tender and non-distended. No rebound or guarding. No HSM or masses noted. Rectal: deferred Msk: in power wheelchair (pivots to chair at baseline) Extremities:  Without edema. Neurologic:  Alert and  oriented x4 Psych:  Alert and cooperative. Normal mood and affect.  Assessment  Vincent Anthony is a 52 y.o. male with a history of Elevated LFTs secondary to medication effect, GERD, abdominal pain, multiple sclerosis, and neurogenic bladder presenting today to discuss need for repeat flexible sigmoidoscopy.   GERD: Well controlled on pantoprazole 40 mg once daily.   Constipation, abdominal pain: Taken off Linzess in December after ileus. Has been on Amitiza BID  and senna daily with BM every other day and still having intermittent hard stools. Will add miralax 1/2 capful every other day to help with better bowel consistency and frequency.   Screening for colon cancer: Had colonoscopy in 2014 due to abdominal pain and constipation that  was reported as normal. Colonoscopy in August 2023 with  redundant colon and  normal except for poor view of sigmoid and rectum given formed stool. Recommended follow up flexible sigmoidoscopy in 1 year. Currently due.   PLAN   Proceed with flexible sigmoidoscopy with propofol by Dr. Jena Gauss in near future: the risks, benefits, and alternatives have been discussed with the patient in detail. The patient states understanding and desires to proceed. ASA 3 Hold metformin night prior to and morning of.  Continue Amitiza BID Continue Senokot daily Miralax 1/2 capful daily for 4 days prior Needs colonoscopy prep (Golytely) Fleet enema night prior Fleet enema morning of procedure  Continue pantoprazole 40 mg nightly Continue Amitiza 8 mcg BID. Continue senna nightly.  Start miralax 1/2 capful every other day.  Follow up 1 year  Brooke Bonito, MSN, FNP-BC, AGACNP-BC [facility]

## 2023-05-17 NOTE — Patient Instructions (Addendum)
We are scheduling you for a flexible sigmoidoscopy in the near future with Dr. Jena Gauss. We will send you instructions to the faciitly for medication adjustments and accurate prep.   Continue Amitiza 8 mcg BID and senna nightly. Add miralax 1/2 capful every other day.   Continue pantoprazole 40 mg once daily.   Follow up in 1 year.   It was a pleasure to see you today. I want to create trusting relationships with patients. If you receive a survey regarding your visit,  I greatly appreciate you taking time to fill this out on paper or through your MyChart. I value your feedback.  Brooke Bonito, MSN, FNP-BC, AGACNP-BC [facility]

## 2023-05-20 DIAGNOSIS — N319 Neuromuscular dysfunction of bladder, unspecified: Secondary | ICD-10-CM | POA: Diagnosis not present

## 2023-05-20 DIAGNOSIS — E785 Hyperlipidemia, unspecified: Secondary | ICD-10-CM | POA: Diagnosis not present

## 2023-05-20 DIAGNOSIS — G35 Multiple sclerosis: Secondary | ICD-10-CM | POA: Diagnosis not present

## 2023-05-20 DIAGNOSIS — G629 Polyneuropathy, unspecified: Secondary | ICD-10-CM | POA: Diagnosis not present

## 2023-05-20 DIAGNOSIS — E1143 Type 2 diabetes mellitus with diabetic autonomic (poly)neuropathy: Secondary | ICD-10-CM | POA: Diagnosis not present

## 2023-05-20 DIAGNOSIS — K5909 Other constipation: Secondary | ICD-10-CM | POA: Diagnosis not present

## 2023-05-20 DIAGNOSIS — K219 Gastro-esophageal reflux disease without esophagitis: Secondary | ICD-10-CM | POA: Diagnosis not present

## 2023-05-20 DIAGNOSIS — R569 Unspecified convulsions: Secondary | ICD-10-CM | POA: Diagnosis not present

## 2023-05-25 DIAGNOSIS — F411 Generalized anxiety disorder: Secondary | ICD-10-CM | POA: Diagnosis not present

## 2023-05-26 DIAGNOSIS — M1991 Primary osteoarthritis, unspecified site: Secondary | ICD-10-CM | POA: Diagnosis not present

## 2023-05-26 DIAGNOSIS — G894 Chronic pain syndrome: Secondary | ICD-10-CM | POA: Diagnosis not present

## 2023-06-01 DIAGNOSIS — R059 Cough, unspecified: Secondary | ICD-10-CM | POA: Diagnosis not present

## 2023-06-01 DIAGNOSIS — R0989 Other specified symptoms and signs involving the circulatory and respiratory systems: Secondary | ICD-10-CM | POA: Diagnosis not present

## 2023-06-02 DIAGNOSIS — J069 Acute upper respiratory infection, unspecified: Secondary | ICD-10-CM | POA: Diagnosis not present

## 2023-06-02 DIAGNOSIS — R058 Other specified cough: Secondary | ICD-10-CM | POA: Diagnosis not present

## 2023-06-04 DIAGNOSIS — J069 Acute upper respiratory infection, unspecified: Secondary | ICD-10-CM | POA: Diagnosis not present

## 2023-06-04 DIAGNOSIS — F4323 Adjustment disorder with mixed anxiety and depressed mood: Secondary | ICD-10-CM | POA: Diagnosis not present

## 2023-06-04 DIAGNOSIS — F411 Generalized anxiety disorder: Secondary | ICD-10-CM | POA: Diagnosis not present

## 2023-06-04 DIAGNOSIS — R058 Other specified cough: Secondary | ICD-10-CM | POA: Diagnosis not present

## 2023-06-09 DIAGNOSIS — F411 Generalized anxiety disorder: Secondary | ICD-10-CM | POA: Diagnosis not present

## 2023-06-10 DIAGNOSIS — J189 Pneumonia, unspecified organism: Secondary | ICD-10-CM | POA: Diagnosis not present

## 2023-06-10 DIAGNOSIS — R058 Other specified cough: Secondary | ICD-10-CM | POA: Diagnosis not present

## 2023-06-11 ENCOUNTER — Telehealth: Payer: Self-pay | Admitting: *Deleted

## 2023-06-11 NOTE — Telephone Encounter (Signed)
Called [facility] to schedule flex sig with Dr. Jena Gauss ASA 3 and was placed on hold and then was told scheduler not available to call back. -hold metformin night prior and morning of -miralax 1/2 capful daily 4 days prior -golytely -continue Amitiza and senna -fleet enema night prior -fleet enema morning of

## 2023-06-16 DIAGNOSIS — R062 Wheezing: Secondary | ICD-10-CM | POA: Diagnosis not present

## 2023-06-16 DIAGNOSIS — R058 Other specified cough: Secondary | ICD-10-CM | POA: Diagnosis not present

## 2023-06-17 ENCOUNTER — Encounter: Payer: Self-pay | Admitting: *Deleted

## 2023-06-17 MED ORDER — FLEET ENEMA RE ENEM: ENEMA | RECTAL | 0 refills | Status: DC

## 2023-06-17 MED ORDER — BISACODYL 5 MG PO TBEC: DELAYED_RELEASE_TABLET | ORAL | 0 refills | Status: DC

## 2023-06-17 MED ORDER — PEG 3350-KCL-NA BICARB-NACL 420 G PO SOLR: 4000.0000 mL | Freq: Once | ORAL | 0 refills | Status: AC

## 2023-06-17 NOTE — Telephone Encounter (Signed)
Called and was transferred to schedule and had to LMOVM to call back

## 2023-06-17 NOTE — Telephone Encounter (Signed)
Scheduler called back. Scheduled for 11/13. She needed an arrival time since they do have to provide transportation. Provided this. Instructions and rx to be faxed to them at [phone]   Per [facility], no PA is required for flex sig

## 2023-06-17 NOTE — Addendum Note (Signed)
Addended by: Armstead Peaks on: 06/17/2023 10:49 AM   Modules accepted: Orders

## 2023-06-18 DIAGNOSIS — R058 Other specified cough: Secondary | ICD-10-CM | POA: Diagnosis not present

## 2023-06-18 DIAGNOSIS — R062 Wheezing: Secondary | ICD-10-CM | POA: Diagnosis not present

## 2023-06-23 DIAGNOSIS — G894 Chronic pain syndrome: Secondary | ICD-10-CM | POA: Diagnosis not present

## 2023-06-23 DIAGNOSIS — M1991 Primary osteoarthritis, unspecified site: Secondary | ICD-10-CM | POA: Diagnosis not present

## 2023-06-25 DIAGNOSIS — F411 Generalized anxiety disorder: Secondary | ICD-10-CM | POA: Diagnosis not present

## 2023-07-02 DIAGNOSIS — E1143 Type 2 diabetes mellitus with diabetic autonomic (poly)neuropathy: Secondary | ICD-10-CM | POA: Diagnosis not present

## 2023-07-02 DIAGNOSIS — K5909 Other constipation: Secondary | ICD-10-CM | POA: Diagnosis not present

## 2023-07-02 DIAGNOSIS — R569 Unspecified convulsions: Secondary | ICD-10-CM | POA: Diagnosis not present

## 2023-07-07 DIAGNOSIS — F411 Generalized anxiety disorder: Secondary | ICD-10-CM | POA: Diagnosis not present

## 2023-07-07 DIAGNOSIS — F4323 Adjustment disorder with mixed anxiety and depressed mood: Secondary | ICD-10-CM | POA: Diagnosis not present

## 2023-07-09 DIAGNOSIS — F411 Generalized anxiety disorder: Secondary | ICD-10-CM | POA: Diagnosis not present

## 2023-07-16 DIAGNOSIS — L240 Irritant contact dermatitis due to detergents: Secondary | ICD-10-CM | POA: Diagnosis not present

## 2023-07-16 DIAGNOSIS — T7840XA Allergy, unspecified, initial encounter: Secondary | ICD-10-CM | POA: Diagnosis not present

## 2023-07-19 ENCOUNTER — Other Ambulatory Visit: Payer: Self-pay

## 2023-07-19 ENCOUNTER — Emergency Department (HOSPITAL_COMMUNITY): Payer: Medicare HMO

## 2023-07-19 ENCOUNTER — Encounter (HOSPITAL_COMMUNITY): Payer: Self-pay

## 2023-07-19 ENCOUNTER — Emergency Department (HOSPITAL_COMMUNITY)
Admission: EM | Admit: 2023-07-19 | Discharge: 2023-07-19 | Disposition: A | Discharge status: Skilled Nursing Facility | Payer: Medicare HMO | Attending: Emergency Medicine | Admitting: Emergency Medicine

## 2023-07-19 DIAGNOSIS — T7840XA Allergy, unspecified, initial encounter: Secondary | ICD-10-CM | POA: Diagnosis not present

## 2023-07-19 DIAGNOSIS — L240 Irritant contact dermatitis due to detergents: Secondary | ICD-10-CM | POA: Diagnosis not present

## 2023-07-19 DIAGNOSIS — Z113 Encounter for screening for infections with a predominantly sexual mode of transmission: Secondary | ICD-10-CM | POA: Insufficient documentation

## 2023-07-19 DIAGNOSIS — R21 Rash and other nonspecific skin eruption: Secondary | ICD-10-CM | POA: Diagnosis not present

## 2023-07-19 DIAGNOSIS — L509 Urticaria, unspecified: Secondary | ICD-10-CM | POA: Diagnosis not present

## 2023-07-19 DIAGNOSIS — R059 Cough, unspecified: Secondary | ICD-10-CM | POA: Insufficient documentation

## 2023-07-19 LAB — CBC WITH DIFFERENTIAL/PLATELET
Abs Immature Granulocytes: 0.03 10*3/uL (ref 0.00–0.07)
Basophils Absolute: 0.1 10*3/uL (ref 0.0–0.1)
Basophils Relative: 1 %
Eosinophils Absolute: 0.1 10*3/uL (ref 0.0–0.5)
Eosinophils Relative: 1 %
HCT: 40.1 % (ref 39.0–52.0)
Hemoglobin: 12.7 g/dL — ABNORMAL LOW (ref 13.0–17.0)
Immature Granulocytes: 0 %
Lymphocytes Relative: 18 %
Lymphs Abs: 1.7 10*3/uL (ref 0.7–4.0)
MCH: 26.1 pg (ref 26.0–34.0)
MCHC: 31.7 g/dL (ref 30.0–36.0)
MCV: 82.5 fL (ref 80.0–100.0)
Monocytes Absolute: 0.7 10*3/uL (ref 0.1–1.0)
Monocytes Relative: 8 %
Neutro Abs: 7 10*3/uL (ref 1.7–7.7)
Neutrophils Relative %: 72 %
Platelets: 324 10*3/uL (ref 150–400)
RBC: 4.86 MIL/uL (ref 4.22–5.81)
RDW: 17.1 % — ABNORMAL HIGH (ref 11.5–15.5)
WBC: 9.7 10*3/uL (ref 4.0–10.5)
nRBC: 0 % (ref 0.0–0.2)

## 2023-07-19 LAB — COMPREHENSIVE METABOLIC PANEL
ALT: 23 U/L (ref 0–44)
AST: 20 U/L (ref 15–41)
Albumin: 3.7 g/dL (ref 3.5–5.0)
Alkaline Phosphatase: 93 U/L (ref 38–126)
Anion gap: 9 (ref 5–15)
BUN: 18 mg/dL (ref 6–20)
CO2: 25 mmol/L (ref 22–32)
Calcium: 8.9 mg/dL (ref 8.9–10.3)
Chloride: 103 mmol/L (ref 98–111)
Creatinine, Ser: 1.13 mg/dL (ref 0.61–1.24)
GFR, Estimated: >60 mL/min (ref >60)
Glucose, Bld: 94 mg/dL (ref 70–99)
Potassium: 3.8 mmol/L (ref 3.5–5.1)
Sodium: 137 mmol/L (ref 135–145)
Total Bilirubin: 0.3 mg/dL (ref 0.3–1.2)
Total Protein: 7.3 g/dL (ref 6.5–8.1)

## 2023-07-19 LAB — RPR: RPR Ser Ql: NONREACTIVE

## 2023-07-19 NOTE — ED Provider Notes (Signed)
[place] EMERGENCY DEPARTMENT AT [facility] Provider Note   CSN: [id number] Arrival date & time: 07/19/23  0048     History  Chief Complaint  Patient presents with   Rash    RACYN REINWALD is a 52 y.o. male.  52 year old male who presents ER today secondary to new or worsening rash on his neck hands and feet.  Patient states that he started with a pruritic rash on his neck Thursday.  Was seen by the nurse practitioner at the facility on either Friday or Saturday got started on steroid cream and some type of oral medication.  States has gotten worse and now spread to his arms.  He does not feel any pruritus in his hands or his feet but the lesions on his arms are itchy.  Had some type of viral infection previously but cleared up about 3 to 4 weeks ago.   Rash      Home Medications Prior to Admission medications   Medication Sig Start Date End Date Taking? Authorizing Provider  amitriptyline (ELAVIL) 10 MG tablet Take 1 tablet (10 mg total) by mouth at bedtime. Hold until linezolid course has finished Patient taking differently: Take 10 mg by mouth at bedtime. 01/06/21   Pokhrel, Rebekah Chesterfield, MD  artificial tears (LACRILUBE) OINT ophthalmic ointment Place into both eyes every 4 (four) hours as needed for dry eyes. 01/06/21   Pokhrel, Rebekah Chesterfield, MD  baclofen (LIORESAL) 10 MG tablet Take 1 tablet by mouth 3 (three) times daily. 11/09/14   [provider]  bisacodyl (DULCOLAX) 10 MG suppository Place 1 suppository (10 mg total) rectally at bedtime. For 4 days only 09/10/22   Shon Hale, MD  bisacodyl 5 MG EC tablet As directed 09/10/22   Shon Hale, MD  bisacodyl 5 MG EC tablet As directed 06/17/23   Rourk, Gerrit Friends, MD  buPROPion (WELLBUTRIN XL) 300 MG 24 hr tablet Take 300 mg by mouth daily. 09/08/22   [provider]  cetirizine (ZYRTEC) 10 MG tablet Take 10 mg by mouth daily.    [provider]  Cholecalciferol (VITAMIN D3) 50 MCG (2000  UT) TABS Take 2,000 Units by mouth daily.    [provider]  clonazePAM (KLONOPIN) 1 MG tablet Take 1 mg by mouth 2 (two) times daily.    [provider]  Emollient (EUCERIN) lotion Apply 1 Application topically every 12 (twelve) hours as needed for dry skin.    [provider]  fluticasone (FLONASE) 50 MCG/ACT nasal spray Place 1 spray into both nostrils daily.    [provider]  hyoscyamine (LEVBID) 0.375 MG 12 hr tablet TAKE (1) TABLET BYMOUTH EVERY TWELVE HOURS. 09/16/20   Gelene Mink, NP  levETIRAcetam (KEPPRA) 250 MG tablet Take 250 mg by mouth 2 (two) times daily. 11/09/14   [provider]  lubiprostone (AMITIZA) 8 MCG capsule Take by mouth. 05/07/23   [provider]  metFORMIN (GLUCOPHAGE) 500 MG tablet Take 500 mg by mouth 2 (two) times daily.    [provider]  modafinil (PROVIGIL) 200 MG tablet Take 200 mg by mouth daily.    [provider]  Multiple Vitamin (MULTIVITAMIN WITH MINERALS) TABS tablet Take 1 tablet by mouth daily.    [provider]  ondansetron (ZOFRAN) 4 MG tablet Take 4 mg by mouth every 8 (eight) hours as needed for nausea or vomiting.    [provider]  pantoprazole (PROTONIX) 40 MG tablet Take 1 tablet (40 mg total)  by mouth at bedtime. 01/06/21   Pokhrel, Rebekah Chesterfield, MD  polyethylene glycol (MIRALAX / GLYCOLAX) 17 g packet Take 17 g by mouth 2 (two) times daily. 09/10/22   Shon Hale, MD  pravastatin (PRAVACHOL) 20 MG tablet Take 20 mg by mouth daily.    [provider]  pregabalin (LYRICA) 150 MG capsule Take 150 mg by mouth at bedtime. 02/06/22   [provider]  pregabalin (LYRICA) 75 MG capsule Take 75 mg by mouth daily.    [provider]  senna (SENOKOT) 8.6 MG tablet Take 2 tablets (17.2 mg total) by mouth 2 (two) times daily. Hold for loose stools >2/day 09/10/22   Shon Hale, MD  sodium phosphate (FLEET) ENEM As directed 06/17/23    Rourk, Gerrit Friends, MD      Allergies    Celebrex [celecoxib], Clindamycin, Vioxx [rofecoxib], and Zanaflex [tizanidine hcl]    Review of Systems   Review of Systems  Skin:  Positive for rash.    Physical Exam Updated Vital Signs BP 121/72   Pulse 73   Temp 98.4 F (36.9 C)   Resp 18   Ht 6\' 3"  (1.905 m)   Wt 109.3 kg   SpO2 99%   BMI 30.12 kg/m  Physical Exam Vitals and nursing note reviewed.  Constitutional:      Appearance: He is well-developed.  HENT:     Head: Normocephalic and atraumatic.  Cardiovascular:     Rate and Rhythm: Normal rate.  Pulmonary:     Effort: Pulmonary effort is normal. No respiratory distress.  Abdominal:     General: There is no distension.  Musculoskeletal:        General: Normal range of motion.     Cervical back: Normal range of motion.  Skin:    Comments: Difficult to fully assess his rash as he has a chronic rash of some sort on his face and neck.  He has some new erythematous papules on his face and neck that are flat and blanchable.  On bilateral arms he has some raised erythematous papules that have a little dusky color in the middle of them.  He also has blanchable flat erythematous papules on bilateral hands and feet.  Neurological:     Mental Status: He is alert.     ED Results / Procedures / Treatments   Labs (all labs ordered are listed, but only abnormal results are displayed) Labs Reviewed  CBC WITH DIFFERENTIAL/PLATELET - Abnormal; Notable for the following components:      Result Value   Hemoglobin 12.7 (*)    RDW 17.1 (*)    All other components within normal limits  CULTURE, BLOOD (ROUTINE X 2)  CULTURE, BLOOD (ROUTINE X 2)  MONKEYPOX VIRUS DNA, QUALITATIVE REAL-TIME PCR  COMPREHENSIVE METABOLIC PANEL  COXSACKIE A VIRUS ANTIBODIES  COXSACKIE B VIRUS ANTIBODIES  RPR    EKG None  Radiology DG Chest Portable 1 View  Result Date: 07/19/2023 CLINICAL DATA:  Cough.  Lesions since Thursday EXAM: PORTABLE CHEST  1 VIEW COMPARISON:  01/28/2021 FINDINGS: Thoracic scoliosis convex towards the right. Heart size and pulmonary vascularity are normal. Lungs are clear. No pleural effusions. No pneumothorax. Mediastinal contours appear intact. IMPRESSION: No active disease. Electronically Signed   By: Burman Nieves M.D.   On: 07/19/2023 02:36    Procedures Procedures    Medications Ordered in ED Medications - No data to display  ED Course/ Medical Decision Making/ A&P  Medical Decision Making Amount and/or Complexity of Data Reviewed Labs: ordered. Radiology: ordered.  Low suspicion for meningococcemia without fever and these rashes do not seem to be consistent with petechiae or purpura.  The rash on his hand and feet resemble hand-foot-and-mouth.  However the rash on his arms more resembles secondary syphilis versus insect bites.  Will initiate workup for same.  Blood cultures as he was recently ill just to ensure he does have endocarditis although I think this is less likely with normal vital signs and no chest pain. Even if not figured out in ER today, this doesn't appear to be consistent with any emergent causes for rash. If some labs are delayed, may d/c and have pt follow up with PCP/Derm.  From D/C papers:  I don't know the cause of your rashes but it doesn't appear to be consistent with any of the emergency causes for a rash.  You have three distinctly different rashes.  The chronic one on your face and neck was not evaluated today.  The new raised rash on your neck and arms could be a couple things like insect bites, syphillis, monkey pox or molluscum. You have a syphillis and monkey pox test pending. The only one of these that would need specific treatment is syphillis and we will call you with a prescription if needed.  The flat rash on your palms and soles looks like a viral rash like hand/foot/mouth. You have a coxsackie test pending as well. This is also  supportive care.   I would suggest going ahead and getting a PCP or dermatology appointment for further evaluation and recommendations. If any worsening of symptoms or changes in symptoms then return here for further evaluation.   Stable throughout stay here. Labs pending. Will fu. Needs to see PCP/derm for definite diagnosis if not found here.   Final Clinical Impression(s) / ED Diagnoses Final diagnoses:  Rash    Rx / DC Orders ED Discharge Orders     None         Drayke Grabel, Barbara Cower, MD 07/19/23 [phone]

## 2023-07-19 NOTE — ED Triage Notes (Signed)
Pt to ED from [facility] cc rash since Thursday - on head, bilateral arms, chest, hands and legs. Pt received steriod shot and cream Friday but rash is not improving. C/o itchiness. Hx of MS

## 2023-07-19 NOTE — Discharge Instructions (Addendum)
I don't know the cause of your rashes but it doesn't appear to be consistent with any of the emergency causes for a rash.  You have three distinctly different rashes.  The chronic one on your face and neck was not evaluated today.  The new raised rash on your neck and arms could be a couple things like insect bites, syphillis, monkey pox or molluscum. You have a syphillis and monkey pox test pending. The only one of these that would need specific treatment is syphillis and we will call you with a prescription if needed.  The flat rash on your palms and soles looks like a viral rash like hand/foot/mouth. You have a coxsackie test pending as well. This is also supportive care.   I would suggest going ahead and getting a PCP or dermatology appointment for further evaluation and recommendations. If any worsening of symptoms or changes in symptoms then return here for further evaluation.

## 2023-07-19 NOTE — ED Notes (Signed)
Urine sample collected if needed. 

## 2023-07-21 DIAGNOSIS — M1991 Primary osteoarthritis, unspecified site: Secondary | ICD-10-CM | POA: Diagnosis not present

## 2023-07-21 DIAGNOSIS — K5909 Other constipation: Secondary | ICD-10-CM | POA: Diagnosis not present

## 2023-07-22 LAB — COXSACKIE B VIRUS ANTIBODIES
Coxsackie B1 Ab: 1:100 {titer} — ABNORMAL HIGH
Coxsackie B2 Ab: 1:100 {titer} — ABNORMAL HIGH
Coxsackie B3 Ab: 1:500 {titer} — ABNORMAL HIGH
Coxsackie B4 Ab: 1:100 {titer} — ABNORMAL HIGH
Coxsackie B5 Ab: 1:1000 {titer} — ABNORMAL HIGH
Coxsackie B6 Ab: 1:100 {titer} — ABNORMAL HIGH

## 2023-07-23 DIAGNOSIS — B084 Enteroviral vesicular stomatitis with exanthem: Secondary | ICD-10-CM | POA: Diagnosis not present

## 2023-07-23 DIAGNOSIS — L240 Irritant contact dermatitis due to detergents: Secondary | ICD-10-CM | POA: Diagnosis not present

## 2023-07-24 LAB — CULTURE, BLOOD (ROUTINE X 2)
Culture: NO GROWTH
Culture: NO GROWTH
Special Requests: ADEQUATE
Special Requests: ADEQUATE

## 2023-07-28 DIAGNOSIS — B09 Unspecified viral infection characterized by skin and mucous membrane lesions: Secondary | ICD-10-CM | POA: Diagnosis not present

## 2023-07-28 LAB — COXSACKIE A VIRUS ANTIBODIES
Coxsackie A16 IgG: NEGATIVE {titer}
Coxsackie A16 IgM: NEGATIVE {titer}
Coxsackie A24 IgG: NEGATIVE {titer}
Coxsackie A24 IgM: NEGATIVE {titer}
Coxsackie A7 IgG: NEGATIVE {titer}
Coxsackie A7 IgM: NEGATIVE {titer}
Coxsackie A9 IgG: NEGATIVE {titer}
Coxsackie A9 IgM: NEGATIVE {titer}

## 2023-08-04 DIAGNOSIS — K5909 Other constipation: Secondary | ICD-10-CM | POA: Diagnosis not present

## 2023-08-04 DIAGNOSIS — R569 Unspecified convulsions: Secondary | ICD-10-CM | POA: Diagnosis not present

## 2023-08-04 DIAGNOSIS — G35 Multiple sclerosis: Secondary | ICD-10-CM | POA: Diagnosis not present

## 2023-08-04 DIAGNOSIS — E1143 Type 2 diabetes mellitus with diabetic autonomic (poly)neuropathy: Secondary | ICD-10-CM | POA: Diagnosis not present

## 2023-08-04 DIAGNOSIS — R252 Cramp and spasm: Secondary | ICD-10-CM | POA: Diagnosis not present

## 2023-08-06 NOTE — Patient Instructions (Signed)
Vincent Anthony  08/06/2023     @PREFPERIOPPHARMACY @   Your procedure is scheduled on  08/16/2023.   Report to [facility] at  0900  A.M.   Call this number if you have problems the morning of surgery:  [phone]  If you experience any cold or flu symptoms such as cough, fever, chills, shortness of breath, etc. between now and your scheduled surgery, please notify us at the above number.   Remember:  Follow the diet and prep instructions given to you by the office.      DO NOT take any metformin the morning of your procedure.    You may drink clear liquids until 0700 am on 08/11/2023.     Clear liquids allowed are:                    Water, Juice (No red color; non-citric and without pulp; diabetics please choose diet or no sugar options), Carbonated beverages (diabetics please choose diet or no sugar options), Clear Tea (No creamer, milk, or cream, including half & half and powdered creamer), Black Coffee Only (No creamer, milk or cream, including half & half and powdered creamer), Plain Jell-O Only (No red color; diabetics please choose no sugar options), Clear Sports drink (No red color; diabetics please choose diet or no sugar options), and Plain Popsicles Only (No red color; diabetics please choose no sugar options)    Take these medicines the morning of surgery with A SIP OF WATER            baclofen, bupropion, cetirizine, clonazepam, levetiracetam.     Do not wear jewelry, make-up or nail polish, including gel polish,  artificial nails, or any other type of covering on natural nails (fingers and  toes).  Do not wear lotions, powders, or perfumes, or deodorant.  Do not shave 48 hours prior to surgery.  Men may shave face and neck.  Do not bring valuables to the hospital.  [facility] is not responsible for any belongings or valuables.  Contacts, dentures or bridgework may not be worn into surgery.  Leave your suitcase in the car.  After surgery it may be  brought to your room.  For patients admitted to the hospital, discharge time will be determined by your treatment team.  Patients discharged the day of surgery will not be allowed to drive home.    Special instructions:   DO NOT smoke tobacco or vape for 24 hours before your procedure.  Please read over the following fact sheets that you were given. Anesthesia Post-op Instructions and Care and Recovery After Surgery      Flexible Sigmoidoscopy, Care After The following information offers guidance on how to care for yourself after your procedure. Your health care provider may also give you more specific instructions. If you have problems or questions, contact your health care provider. What can I expect after the procedure? After the procedure, it is common to have: Cramping or pain in your abdomen. Bloating. A small amount of blood with your stool. This may happen if a sample of tissue was removed for testing (biopsy). Follow these instructions at home: Eating and drinking Drink enough fluid to keep your urine pale yellow. Follow instructions from your health care provider about what you may eat and drink. Go back to your normal diet as told by your health care provider. Avoid heavy or fried foods. These may be hard to digest. Activity  If  you were given a sedative during the procedure, it can affect you for several hours. Do not drive or operate machinery until your health care provider says that it is safe. Rest as told by your health care provider. Do not sit for a long time without moving. Get up to take short walks every 1-2 hours. This will improve blood flow and breathing. Ask for help if you feel weak or unsteady. Return to your normal activities as told by your health care provider. Ask your health care provider what activities are safe for you. General instructions Take over-the-counter and prescription medicines only as told by your health care provider. Try walking  around when you have cramps or feel bloated. Contact a health care provider if: You have pain or cramping in your abdomen that gets worse or is not helped with medicine. You have a small amount of bleeding from your rectum for more than 24 hours after your procedure. You have nausea or vomiting. You feel weak or dizzy. You have a fever. Get help right away if: You pass large blood clots or see a large amount of blood in the toilet after having a bowel movement. You have severe pain in your abdomen. This information is not intended to replace advice given to you by your health care provider. Make sure you discuss any questions you have with your health care provider. Document Revised: 02/18/2022 Document Reviewed: 02/18/2022 Elsevier Patient Education  2024 [employer] Monitored Anesthesia Care, Care After The following information offers guidance on how to care for yourself after your procedure. Your health care provider may also give you more specific instructions. If you have problems or questions, contact your health care provider. What can I expect after the procedure? After the procedure, it is common to have: Tiredness. Little or no memory about what happened during or after the procedure. Impaired judgment when it comes to making decisions. Nausea or vomiting. Some trouble with balance. Follow these instructions at home: For the time period you were told by your health care provider:  Rest. Do not participate in activities where you could fall or become injured. Do not drive or use machinery. Do not drink alcohol. Do not take sleeping pills or medicines that cause drowsiness. Do not make important decisions or sign legal documents. Do not take care of children on your own. Medicines Take over-the-counter and prescription medicines only as told by your health care provider. If you were prescribed antibiotics, take them as told by your health care provider. Do not stop using  the antibiotic even if you start to feel better. Eating and drinking Follow instructions from your health care provider about what you may eat and drink. Drink enough fluid to keep your urine pale yellow. If you vomit: Drink clear fluids slowly and in small amounts as you are able. Clear fluids include water, ice chips, low-calorie sports drinks, and fruit juice that has water added to it (diluted fruit juice). Eat light and bland foods in small amounts as you are able. These foods include bananas, applesauce, rice, lean meats, toast, and crackers. General instructions  Have a responsible adult stay with you for the time you are told. It is important to have someone help care for you until you are awake and alert. If you have sleep apnea, surgery and some medicines can increase your risk for breathing problems. Follow instructions from your health care provider about wearing your sleep device: When you are sleeping. This includes during daytime naps.  While taking prescription pain medicines, sleeping medicines, or medicines that make you drowsy. Do not use any products that contain nicotine or tobacco. These products include cigarettes, chewing tobacco, and vaping devices, such as e-cigarettes. If you need help quitting, ask your health care provider. Contact a health care provider if: You feel nauseous or vomit every time you eat or drink. You feel light-headed. You are still sleepy or having trouble with balance after 24 hours. You get a rash. You have a fever. You have redness or swelling around the IV site. Get help right away if: You have trouble breathing. You have new confusion after you get home. These symptoms may be an emergency. Get help right away. Call 911. Do not wait to see if the symptoms will go away. Do not drive yourself to the hospital. This information is not intended to replace advice given to you by your health care provider. Make sure you discuss any questions you  have with your health care provider. Document Revised: 02/09/2022 Document Reviewed: 02/09/2022 Elsevier Patient Education  2024 [employer].

## 2023-08-09 ENCOUNTER — Encounter (HOSPITAL_COMMUNITY): Payer: Self-pay

## 2023-08-09 ENCOUNTER — Encounter (HOSPITAL_COMMUNITY)
Admission: RE | Admit: 2023-08-09 | Discharge: 2023-08-09 | Disposition: A | Discharge status: Home / Self Care | Payer: Medicare HMO | Source: Ambulatory Visit | Attending: Internal Medicine | Admitting: Internal Medicine

## 2023-08-09 ENCOUNTER — Other Ambulatory Visit: Payer: Self-pay

## 2023-08-09 VITALS — BP 109/69 | HR 73 | Temp 98.4°F | Resp 18 | Ht 75.0 in | Wt 241.0 lb

## 2023-08-09 DIAGNOSIS — Z01810 Encounter for preprocedural cardiovascular examination: Secondary | ICD-10-CM | POA: Insufficient documentation

## 2023-08-09 DIAGNOSIS — F411 Generalized anxiety disorder: Secondary | ICD-10-CM | POA: Diagnosis not present

## 2023-08-09 DIAGNOSIS — R7303 Prediabetes: Secondary | ICD-10-CM | POA: Diagnosis not present

## 2023-08-09 DIAGNOSIS — F4323 Adjustment disorder with mixed anxiety and depressed mood: Secondary | ICD-10-CM | POA: Diagnosis not present

## 2023-08-09 HISTORY — DX: Myoneural disorder, unspecified: G70.9

## 2023-08-09 HISTORY — DX: Depression, unspecified: F32.A

## 2023-08-09 NOTE — Final Progress Note (Signed)
Called [facility] to go over preop instructions. Spoke with nurse Revonda Standard & she verbalized full understanding of instructions & pt's arrival time.

## 2023-08-11 ENCOUNTER — Other Ambulatory Visit: Payer: Self-pay

## 2023-08-11 ENCOUNTER — Ambulatory Visit (HOSPITAL_COMMUNITY): Payer: Medicare HMO | Admitting: Anesthesiology

## 2023-08-11 ENCOUNTER — Encounter (HOSPITAL_COMMUNITY): Payer: Self-pay | Admitting: Internal Medicine

## 2023-08-11 ENCOUNTER — Encounter (HOSPITAL_COMMUNITY)
Admission: RE | Disposition: A | Discharge status: Home / Self Care | Payer: Self-pay | Source: Home / Self Care | Attending: Internal Medicine

## 2023-08-11 ENCOUNTER — Ambulatory Visit (HOSPITAL_COMMUNITY)
Admission: RE | Admit: 2023-08-11 | Discharge: 2023-08-11 | Disposition: A | Discharge status: Home / Self Care | Payer: Medicare HMO | Attending: Internal Medicine | Admitting: Internal Medicine

## 2023-08-11 DIAGNOSIS — K219 Gastro-esophageal reflux disease without esophagitis: Secondary | ICD-10-CM | POA: Insufficient documentation

## 2023-08-11 DIAGNOSIS — Z1211 Encounter for screening for malignant neoplasm of colon: Secondary | ICD-10-CM

## 2023-08-11 DIAGNOSIS — R569 Unspecified convulsions: Secondary | ICD-10-CM | POA: Diagnosis not present

## 2023-08-11 DIAGNOSIS — E119 Type 2 diabetes mellitus without complications: Secondary | ICD-10-CM | POA: Insufficient documentation

## 2023-08-11 DIAGNOSIS — Z7984 Long term (current) use of oral hypoglycemic drugs: Secondary | ICD-10-CM | POA: Diagnosis not present

## 2023-08-11 HISTORY — PX: FLEXIBLE SIGMOIDOSCOPY: SHX5431

## 2023-08-11 LAB — GLUCOSE, CAPILLARY: Glucose-Capillary: 76 mg/dL (ref 70–99)

## 2023-08-11 SURGERY — SIGMOIDOSCOPY, FLEXIBLE
Anesthesia: General

## 2023-08-11 MED ORDER — PROPOFOL 500 MG/50ML IV EMUL
INTRAVENOUS | Status: AC
Start: 2023-08-11 — End: ?
  Filled 2023-08-11: qty 50

## 2023-08-11 MED ORDER — LIDOCAINE HCL (PF) 1 % IJ SOLN
INTRAMUSCULAR | Status: AC
  Filled 2023-08-11: qty 30

## 2023-08-11 MED ORDER — LACTATED RINGERS IV SOLN: INTRAVENOUS | Status: DC | PRN

## 2023-08-11 MED ORDER — PROPOFOL 500 MG/50ML IV EMUL
INTRAVENOUS | Status: DC | PRN
  Administered 2023-08-11: 150 ug/kg/min via INTRAVENOUS
  Administered 2023-08-11: 60 mg via INTRAVENOUS

## 2023-08-11 NOTE — Anesthesia Postprocedure Evaluation (Signed)
Anesthesia Post Note  Patient: Vincent Anthony  Procedure(s) Performed: FLEXIBLE SIGMOIDOSCOPY  Patient location during evaluation: PACU Anesthesia Type: General Level of consciousness: awake and alert Pain management: pain level controlled Vital Signs Assessment: post-procedure vital signs reviewed and stable Respiratory status: spontaneous breathing, nonlabored ventilation, respiratory function stable and patient connected to nasal cannula oxygen Cardiovascular status: blood pressure returned to baseline and stable Postop Assessment: no apparent nausea or vomiting Anesthetic complications: no   There were no known notable events for this encounter.   Last Vitals:  Vitals:   08/11/23 0933 08/11/23 1128  BP:  (!) 111/58  Pulse: (P) 76 82  Resp: (P) 17 17  Temp: (P) 36.7 C 36.6 C  SpO2: (P) 98% 100%    Last Pain:  Vitals:   08/11/23 1135  TempSrc:   PainSc: 0-No pain                 Yasmin Bronaugh L Kalila Adkison

## 2023-08-11 NOTE — Transfer of Care (Signed)
Immediate Anesthesia Transfer of Care Note  Patient: Vincent Anthony  Procedure(s) Performed: FLEXIBLE SIGMOIDOSCOPY  Patient Location: Endoscopy Unit  Anesthesia Type:General  Level of Consciousness: awake, alert , and oriented  Airway & Oxygen Therapy: Patient Spontanous Breathing  Post-op Assessment: Report given to RN, Post -op Vital signs reviewed and stable, Patient moving all extremities X 4, and Patient able to stick tongue midline  Post vital signs: Reviewed and stable  Last Vitals:  Vitals Value Taken Time  BP 115/58   Temp 98.6   Pulse 79   Resp 20   SpO2 95     Last Pain:  Vitals:   08/11/23 1135  TempSrc:   PainSc: 0-No pain         Complications: No notable events documented.

## 2023-08-11 NOTE — Op Note (Signed)
[facility] Patient Name: Vincent Anthony Procedure Date: 08/11/2023 11:09 AM MRN: [id number] Date of Birth: 25-Oct-1970 Attending MD: Gennette Pac , MD, [id number] CSN: [id number] Age: 52 Admit Type: Outpatient Procedure:                Flexible Sigmoidoscopy Indications:              Screening for colorectal malignant neoplasm Providers:                Gennette Pac, MD, Nena Polio, RN, Zena Amos Referring MD:              Medicines:                Propofol per Anesthesia Complications:            No immediate complications. Estimated Blood Loss:     Estimated blood loss: none. Procedure:                Pre-Anesthesia Assessment:                           - Prior to the procedure, a History and Physical                            was performed, and patient medications and                            allergies were reviewed. The patient's tolerance of                            previous anesthesia was also reviewed. The risks                            and benefits of the procedure and the sedation                            options and risks were discussed with the patient.                            All questions were answered, and informed consent                            was obtained. ASA Grade Assessment: III - A patient                            with severe systemic disease. After reviewing the                            risks and benefits, the patient was deemed in                            satisfactory condition to undergo the procedure.  After obtaining informed consent, the scope was                            passed under direct vision. The [phone])                            scope was introduced through the anus and advanced                            to the the rectosigmoid junction. The flexible                            sigmoidoscopy was accomplished without difficulty.                             The patient tolerated the procedure well. The                            quality of the bowel preparation was inadequate. Scope In: 11:23:37 AM Scope Out: 11:24:43 AM Total Procedure Duration: 0 hours 1 minute 6 seconds  Findings:      The perianal and digital rectal examinations were normal aside from       viscous stool residue on examiners fingers. Endoscopic findings -no       prep. Semiformed and formed stool in the rectum producing a large       air-fluid level advance the scope down to the up to the rectosigmoid       junction - more solid stool and viscous liquid stool present trailing       proximally. At this point the procedure was terminated. Impression:               - Preparation of the colon was inadequate.                           -Future attempts at optical screening will likely                            not be productive. Moderate Sedation:      Moderate (conscious) sedation was personally administered by an       anesthesia professional. The following parameters were monitored: oxygen       saturation, heart rate, blood pressure, respiratory rate, EKG, adequacy       of pulmonary ventilation, and response to care. Recommendation:           - Patient has a contact number available for                            emergencies. The signs and symptoms of potential                            delayed complications were discussed with the                            patient. Return to normal activities tomorrow.  Written discharge instructions were provided to the                            patient.                           - Advance diet as tolerated. Will arrange a                            Cologuard screen through our office in the near                            future. Recommendations for future colonoscopies to                            be determined. Procedure Code(s):        --- Professional ---                            [id number], 52, Sigmoidoscopy, flexible; diagnostic,                            including collection of specimen(s) by brushing or                            washing, when performed (separate procedure) Diagnosis Code(s):        --- Professional ---                           Z12.11, Encounter for screening for malignant                            neoplasm of colon CPT copyright 2022 [employer]. All rights reserved. The codes documented in this report are preliminary and upon coder review may  be revised to meet current compliance requirements. Vincent Anthony. Vincent Wigington, MD Gennette Pac, MD 08/11/2023 12:55:06 PM This report has been signed electronically. Number of Addenda: 0

## 2023-08-11 NOTE — Discharge Instructions (Signed)
  Sigmoidoscopy Discharge Instructions  Read the instructions outlined below and refer to this sheet in the next few weeks. These discharge instructions provide you with general information on caring for yourself after you leave the hospital. Your doctor may also give you specific instructions. While your treatment has been planned according to the most current medical practices available, unavoidable complications occasionally occur. If you have any problems or questions after discharge, call Dr. Jena Gauss at [phone]. ACTIVITY You may resume your regular activity, but move at a slower pace for the next 24 hours.  Take frequent rest periods for the next 24 hours.  Walking will help get rid of the air and reduce the bloated feeling in your belly (abdomen).  No driving for 24 hours (because of the medicine (anesthesia) used during the test).   Do not sign any important legal documents or operate any machinery for 24 hours (because of the anesthesia used during the test).  NUTRITION Drink plenty of fluids.  You may resume your normal diet as instructed by your doctor.  Begin with a light meal and progress to your normal diet. Heavy or fried foods are harder to digest and may make you feel sick to your stomach (nauseated).  Avoid alcoholic beverages for 24 hours or as instructed.  MEDICATIONS You may resume your normal medications unless your doctor tells you otherwise.  WHAT YOU CAN EXPECT TODAY Some feelings of bloating in the abdomen.  Passage of more gas than usual.  Spotting of blood in your stool or on the toilet paper.  IF YOU HAD POLYPS REMOVED DURING THE COLONOSCOPY: No aspirin products for 7 days or as instructed.  No alcohol for 7 days or as instructed.  Eat a soft diet for the next 24 hours.  FINDING OUT THE RESULTS OF YOUR TEST Not all test results are available during your visit. If your test results are not back during the visit, make an appointment with your caregiver to find out  the results. Do not assume everything is normal if you have not heard from your caregiver or the medical facility. It is important for you to follow up on all of your test results.  SEEK IMMEDIATE MEDICAL ATTENTION IF: You have more than a spotting of blood in your stool.  Your belly is swollen (abdominal distention).  You are nauseated or vomiting.  You have a temperature over 101.  You have abdominal pain or discomfort that is severe or gets worse throughout the day.       Your sigmoidoscopy prep was inadequate today  I suggest we wrap up screening with a Cologuard test.  Further recommendations to follow through my office.    at patient request, I called Sharyn Creamer  at [phone].  Rolled to voicemail.  Left a message.

## 2023-08-11 NOTE — Anesthesia Preprocedure Evaluation (Signed)
Anesthesia Evaluation  Patient identified by MRN, date of birth, ID band Patient awake    Reviewed: Allergy & Precautions, H&P , NPO status , Patient's Chart, lab work & pertinent test results, reviewed documented beta blocker date and time   Airway Mallampati: II  TM Distance: >3 FB Neck ROM: full    Dental no notable dental hx. (+) Dental Advisory Given, Teeth Intact   Pulmonary neg pulmonary ROS   Pulmonary exam normal breath sounds clear to auscultation       Cardiovascular Exercise Tolerance: Good negative cardio ROS  Rhythm:regular Rate:Normal     Neuro/Psych  Headaches, Seizures -, Well Controlled,  Multiple sclerosis. Neurogenic bladder  Neuromuscular disease  negative psych ROS   GI/Hepatic ,GERD  Medicated,,History elevated liver enzymes   Endo/Other  negative endocrine ROSdiabetes, Type 2    Renal/GU   negative genitourinary   Musculoskeletal  (+) Arthritis , Osteoarthritis,    Abdominal   Peds  Hematology negative hematology ROS (+)   Anesthesia Other Findings   Reproductive/Obstetrics negative OB ROS                             Anesthesia Physical Anesthesia Plan  ASA: 3  Anesthesia Plan: General   Post-op Pain Management: Minimal or no pain anticipated   Induction: Intravenous  PONV Risk Score and Plan: Propofol infusion  Airway Management Planned: Nasal Cannula and Natural Airway  Additional Equipment: None  Intra-op Plan:   Post-operative Plan:   Informed Consent: I have reviewed the patients History and Physical, chart, labs and discussed the procedure including the risks, benefits and alternatives for the proposed anesthesia with the patient or authorized representative who has indicated his/her understanding and acceptance.     Dental Advisory Given  Plan Discussed with: CRNA  Anesthesia Plan Comments:         Anesthesia Quick Evaluation

## 2023-08-11 NOTE — H&P (Signed)
@LOGO @   Primary Care Physician:  Kerri Perches, MD Primary Gastroenterologist:  Dr. Jena Gauss  Pre-Procedure History & Physical: HPI:  Vincent Anthony is a 52 y.o. male here for  here for a screening sigmoidoscopy.  Colonoscopy last year thwarted by poor prep in the rectum and the sigmoid more proximal colon felt to be seen well.  No GI symptoms at this time.  Past Medical History:  Diagnosis Date   Arthritis    Depression    Diabetes mellitus without complication (HCC)    Elevated LFTs    Elevated liver enzymes    GERD (gastroesophageal reflux disease)    Headache    MS (multiple sclerosis) (HCC)    Neurogenic bladder    Neuromuscular disorder (HCC)    Tremor     Past Surgical History:  Procedure Laterality Date   COLONOSCOPY  2014   COLONOSCOPY WITH PROPOFOL N/A 05/01/2022   Procedure: COLONOSCOPY WITH PROPOFOL;  Surgeon: Corbin Ade, MD;  Location: [facility];  Service: Endoscopy;  Laterality: N/A;  9:15am   HERNIA REPAIR     KNEE SURGERY     TESTICLE REMOVAL      Prior to Admission medications   Medication Sig Start Date End Date Taking? Authorizing Provider  amitriptyline (ELAVIL) 10 MG tablet Take 1 tablet (10 mg total) by mouth at bedtime. Hold until linezolid course has finished Patient taking differently: Take 10 mg by mouth at bedtime. 01/06/21  Yes Pokhrel, Laxman, MD  artificial tears (LACRILUBE) OINT ophthalmic ointment Place into both eyes every 4 (four) hours as needed for dry eyes. 01/06/21  Yes Pokhrel, Laxman, MD  baclofen (LIORESAL) 10 MG tablet Take 1 tablet by mouth 3 (three) times daily. 11/09/14  Yes [provider]  bisacodyl (DULCOLAX) 10 MG suppository Place 1 suppository (10 mg total) rectally at bedtime. For 4 days only 09/10/22  Yes Shon Hale, MD  bisacodyl 5 MG EC tablet As directed 09/10/22  Yes Emokpae, Courage, MD  bisacodyl 5 MG EC tablet As directed 06/17/23  Yes Georgeanna Radziewicz, Gerrit Friends, MD  buPROPion (WELLBUTRIN XL) 300 MG  24 hr tablet Take 300 mg by mouth daily. 09/08/22  Yes [provider]  cetirizine (ZYRTEC) 10 MG tablet Take 10 mg by mouth daily.   Yes [provider]  Cholecalciferol (VITAMIN D3) 50 MCG (2000 UT) TABS Take 2,000 Units by mouth daily.   Yes [provider]  clonazePAM (KLONOPIN) 1 MG tablet Take 1 mg by mouth 2 (two) times daily.   Yes [provider]  Emollient (EUCERIN) lotion Apply 1 Application topically every 12 (twelve) hours as needed for dry skin.   Yes [provider]  fluticasone (FLONASE) 50 MCG/ACT nasal spray Place 1 spray into both nostrils daily.   Yes [provider]  hyoscyamine (LEVBID) 0.375 MG 12 hr tablet TAKE (1) TABLET BYMOUTH EVERY TWELVE HOURS. 09/16/20  Yes Gelene Mink, NP  levETIRAcetam (KEPPRA) 250 MG tablet Take 250 mg by mouth 2 (two) times daily. 11/09/14  Yes [provider]  metFORMIN (GLUCOPHAGE) 500 MG tablet Take 500 mg by mouth 2 (two) times daily.   Yes [provider]  modafinil (PROVIGIL) 200 MG tablet Take 200 mg by mouth daily.   Yes [provider]  Multiple Vitamin (MULTIVITAMIN WITH MINERALS) TABS tablet Take 1 tablet by mouth daily.   Yes [provider]  pantoprazole (PROTONIX) 40 MG tablet Take 1 tablet (40 mg total) by mouth at bedtime.  01/06/21  Yes Pokhrel, Laxman, MD  pravastatin (PRAVACHOL) 20 MG tablet Take 20 mg by mouth daily.   Yes [provider]  pregabalin (LYRICA) 150 MG capsule Take 150 mg by mouth at bedtime. 02/06/22  Yes [provider]  pregabalin (LYRICA) 75 MG capsule Take 75 mg by mouth daily.   Yes [provider]  lubiprostone (AMITIZA) 8 MCG capsule Take by mouth. 05/07/23   [provider]  ondansetron (ZOFRAN) 4 MG tablet Take 4 mg by mouth every 8 (eight) hours as needed for nausea or vomiting.    [provider]  polyethylene glycol (MIRALAX / GLYCOLAX) 17 g packet Take 17 g by mouth 2 (two)  times daily. 09/10/22   Shon Hale, MD  senna (SENOKOT) 8.6 MG tablet Take 2 tablets (17.2 mg total) by mouth 2 (two) times daily. Hold for loose stools >2/day 09/10/22   Shon Hale, MD  sodium phosphate (FLEET) ENEM As directed 06/17/23   Cabela Pacifico, Gerrit Friends, MD    Allergies as of 06/17/2023 - Review Complete 05/17/2023  Allergen Reaction Noted   Celebrex [celecoxib] Nausea And Vomiting 03/04/2013   Clindamycin Diarrhea 09/14/2012   Vioxx [rofecoxib] Nausea And Vomiting 03/04/2013   Zanaflex [tizanidine hcl] Nausea And Vomiting 11/30/2014    Family History  Problem Relation Age of Onset   Heart attack Mother    Diabetes Father    Hypertension Father    Colon cancer Neg Hx    Colonic polyp Neg Hx     Social History   Socioeconomic History   Marital status: Married    Spouse name: Not on file   Number of children: Not on file   Years of education: Not on file   Highest education level: Not on file  Occupational History   Not on file  Tobacco Use   Smoking status: Never   Smokeless tobacco: Never  Substance and Sexual Activity   Alcohol use: No    Comment: occasionally   Drug use: No   Sexual activity: Not on file  Other Topics Concern   Not on file  Social History Narrative   Not on file   Social Determinants of Health   Financial Resource Strain: Not on file  Food Insecurity: Not on file  Transportation Needs: Not on file  Physical Activity: Not on file  Stress: Not on file  Social Connections: Not on file  Intimate Partner Violence: Not on file    Review of Systems: See HPI, otherwise negative ROS  Physical Exam: Pulse (P) 76   Temp (P) 98.1 F (36.7 C) (Oral)   Resp (P) 17   Ht (P) 6\' 3"  (1.905 m)   Wt (P) 109.3 kg   SpO2 (P) 98%   BMI (P) 30.12 kg/m  General:   Alert,  Well-developed, well-nourished, pleasant and cooperative in NAD Lungs:  Clear throughout to auscultation.   No wheezes, crackles, or rhonchi. No acute distress. Heart:   Regular rate and rhythm; no murmurs, clicks, rubs,  or gallops. Abdomen: Non-distended, normal bowel sounds.  Soft and nontender without appreciable mass or hepatosplenomegaly.  Pulses:  Normal pulses noted. Extremities:  Without clubbing or edema.  Impression/Plan:     52 year old gentleman here for sigmoidoscopy for screening  for the reasons as described above.as described above.  The risks, benefits, limitations, alternatives and imponderables have been reviewed with the patient. Questions have been answered. All parties are agreeable.      Notice: This dictation was prepared with Reubin Milan  dictation along with smaller phrase technology. Any transcriptional errors that result from this process are unintentional and may not be corrected upon review.

## 2023-08-12 ENCOUNTER — Encounter: Payer: Self-pay | Admitting: Gastroenterology

## 2023-08-12 ENCOUNTER — Telehealth: Payer: Self-pay | Admitting: Gastroenterology

## 2023-08-12 DIAGNOSIS — Z1211 Encounter for screening for malignant neoplasm of colon: Secondary | ICD-10-CM

## 2023-08-12 NOTE — Telephone Encounter (Signed)
I will place cologuard order. Per Dr. Jena Gauss flex sig not successful.   Please fax [facility] to expect cologuard to be delivered within the next couple of weeks and send this off for patient.   Brooke Bonito, MSN, APRN, FNP-BC, AGACNP-BC [facility] at [facility]

## 2023-08-13 NOTE — Telephone Encounter (Signed)
Informed that cologuard is coming in a couple weeks.

## 2023-08-16 ENCOUNTER — Encounter (HOSPITAL_COMMUNITY): Payer: Self-pay | Admitting: Emergency Medicine

## 2023-08-16 ENCOUNTER — Emergency Department (HOSPITAL_COMMUNITY): Payer: Medicare HMO

## 2023-08-16 ENCOUNTER — Other Ambulatory Visit: Payer: Self-pay

## 2023-08-16 ENCOUNTER — Emergency Department (HOSPITAL_COMMUNITY)
Admission: EM | Admit: 2023-08-16 | Discharge: 2023-08-16 | Payer: Medicare HMO | Attending: Emergency Medicine | Admitting: Emergency Medicine

## 2023-08-16 DIAGNOSIS — R251 Tremor, unspecified: Secondary | ICD-10-CM | POA: Diagnosis not present

## 2023-08-16 DIAGNOSIS — A419 Sepsis, unspecified organism: Secondary | ICD-10-CM | POA: Diagnosis not present

## 2023-08-16 DIAGNOSIS — K219 Gastro-esophageal reflux disease without esophagitis: Secondary | ICD-10-CM | POA: Diagnosis not present

## 2023-08-16 DIAGNOSIS — R0602 Shortness of breath: Secondary | ICD-10-CM | POA: Insufficient documentation

## 2023-08-16 DIAGNOSIS — R519 Headache, unspecified: Secondary | ICD-10-CM | POA: Diagnosis not present

## 2023-08-16 DIAGNOSIS — R339 Retention of urine, unspecified: Secondary | ICD-10-CM | POA: Insufficient documentation

## 2023-08-16 DIAGNOSIS — R911 Solitary pulmonary nodule: Secondary | ICD-10-CM | POA: Insufficient documentation

## 2023-08-16 DIAGNOSIS — Z7984 Long term (current) use of oral hypoglycemic drugs: Secondary | ICD-10-CM | POA: Diagnosis not present

## 2023-08-16 DIAGNOSIS — G35 Multiple sclerosis: Secondary | ICD-10-CM | POA: Insufficient documentation

## 2023-08-16 DIAGNOSIS — Z7401 Bed confinement status: Secondary | ICD-10-CM | POA: Insufficient documentation

## 2023-08-16 DIAGNOSIS — F32A Depression, unspecified: Secondary | ICD-10-CM | POA: Insufficient documentation

## 2023-08-16 DIAGNOSIS — U071 COVID-19: Secondary | ICD-10-CM | POA: Diagnosis not present

## 2023-08-16 DIAGNOSIS — N319 Neuromuscular dysfunction of bladder, unspecified: Secondary | ICD-10-CM | POA: Diagnosis not present

## 2023-08-16 DIAGNOSIS — N200 Calculus of kidney: Secondary | ICD-10-CM | POA: Insufficient documentation

## 2023-08-16 DIAGNOSIS — R197 Diarrhea, unspecified: Secondary | ICD-10-CM | POA: Insufficient documentation

## 2023-08-16 DIAGNOSIS — R9082 White matter disease, unspecified: Secondary | ICD-10-CM | POA: Diagnosis not present

## 2023-08-16 DIAGNOSIS — R000 Tachycardia, unspecified: Secondary | ICD-10-CM | POA: Diagnosis not present

## 2023-08-16 DIAGNOSIS — E119 Type 2 diabetes mellitus without complications: Secondary | ICD-10-CM | POA: Insufficient documentation

## 2023-08-16 DIAGNOSIS — R0603 Acute respiratory distress: Secondary | ICD-10-CM | POA: Diagnosis not present

## 2023-08-16 DIAGNOSIS — I959 Hypotension, unspecified: Secondary | ICD-10-CM | POA: Diagnosis not present

## 2023-08-16 DIAGNOSIS — R0902 Hypoxemia: Secondary | ICD-10-CM | POA: Diagnosis not present

## 2023-08-16 LAB — CBC WITH DIFFERENTIAL/PLATELET
Abs Immature Granulocytes: 0.04 10*3/uL (ref 0.00–0.07)
Basophils Absolute: 0 10*3/uL (ref 0.0–0.1)
Basophils Relative: 0 %
Eosinophils Absolute: 0.1 10*3/uL (ref 0.0–0.5)
Eosinophils Relative: 1 %
HCT: 42.2 % (ref 39.0–52.0)
Hemoglobin: 13.4 g/dL (ref 13.0–17.0)
Immature Granulocytes: 0 %
Lymphocytes Relative: 5 %
Lymphs Abs: 0.5 10*3/uL — ABNORMAL LOW (ref 0.7–4.0)
MCH: 26.5 pg (ref 26.0–34.0)
MCHC: 31.8 g/dL (ref 30.0–36.0)
MCV: 83.6 fL (ref 80.0–100.0)
Monocytes Absolute: 0.3 10*3/uL (ref 0.1–1.0)
Monocytes Relative: 2 %
Neutro Abs: 10.7 10*3/uL — ABNORMAL HIGH (ref 1.7–7.7)
Neutrophils Relative %: 92 %
Platelets: 263 10*3/uL (ref 150–400)
RBC: 5.05 MIL/uL (ref 4.22–5.81)
RDW: 17.2 % — ABNORMAL HIGH (ref 11.5–15.5)
WBC: 11.6 10*3/uL — ABNORMAL HIGH (ref 4.0–10.5)
nRBC: 0 % (ref 0.0–0.2)

## 2023-08-16 LAB — URINALYSIS, W/ REFLEX TO CULTURE (INFECTION SUSPECTED)
Bacteria, UA: NONE SEEN
Bilirubin Urine: NEGATIVE
Glucose, UA: NEGATIVE mg/dL
Hgb urine dipstick: NEGATIVE
Ketones, ur: NEGATIVE mg/dL
Leukocytes,Ua: NEGATIVE
Nitrite: NEGATIVE
Protein, ur: NEGATIVE mg/dL
Specific Gravity, Urine: 1.014 (ref 1.005–1.030)
pH: 6 (ref 5.0–8.0)

## 2023-08-16 LAB — COMPREHENSIVE METABOLIC PANEL
ALT: 18 U/L (ref 0–44)
AST: 21 U/L (ref 15–41)
Albumin: 3.9 g/dL (ref 3.5–5.0)
Alkaline Phosphatase: 150 U/L — ABNORMAL HIGH (ref 38–126)
Anion gap: 12 (ref 5–15)
BUN: 10 mg/dL (ref 6–20)
CO2: 27 mmol/L (ref 22–32)
Calcium: 8.8 mg/dL — ABNORMAL LOW (ref 8.9–10.3)
Chloride: 98 mmol/L (ref 98–111)
Creatinine, Ser: 1.26 mg/dL — ABNORMAL HIGH (ref 0.61–1.24)
GFR, Estimated: >60 mL/min (ref >60)
Glucose, Bld: 129 mg/dL — ABNORMAL HIGH (ref 70–99)
Potassium: 4.1 mmol/L (ref 3.5–5.1)
Sodium: 137 mmol/L (ref 135–145)
Total Bilirubin: 0.7 mg/dL (ref <1.2)
Total Protein: 7.9 g/dL (ref 6.5–8.1)

## 2023-08-16 LAB — PROTIME-INR
INR: 0.9 (ref 0.8–1.2)
Prothrombin Time: 12.7 s (ref 11.4–15.2)

## 2023-08-16 LAB — RESP PANEL BY RT-PCR (RSV, FLU A&B, COVID)  RVPGX2
Influenza A by PCR: NEGATIVE
Influenza B by PCR: NEGATIVE
Resp Syncytial Virus by PCR: NEGATIVE
SARS Coronavirus 2 by RT PCR: POSITIVE — AB

## 2023-08-16 LAB — LACTIC ACID, PLASMA
Lactic Acid, Venous: 2.6 mmol/L (ref 0.5–1.9)
Lactic Acid, Venous: 2.2 mmol/L (ref 0.5–1.9)

## 2023-08-16 MED ORDER — LACTATED RINGERS IV BOLUS (SEPSIS)
1000.0000 mL | Freq: Once | INTRAVENOUS | Status: AC
  Administered 2023-08-16: 1000 mL via INTRAVENOUS

## 2023-08-16 MED ORDER — METRONIDAZOLE 500 MG/100ML IV SOLN
500.0000 mg | Freq: Once | INTRAVENOUS | Status: AC
Start: 2023-08-16 — End: 2023-08-16
  Administered 2023-08-16: 500 mg via INTRAVENOUS
  Filled 2023-08-16: qty 100

## 2023-08-16 MED ORDER — VANCOMYCIN HCL IN DEXTROSE 1-5 GM/200ML-% IV SOLN
1000.0000 mg | Freq: Once | INTRAVENOUS | Status: DC
  Filled 2023-08-16: qty 200

## 2023-08-16 MED ORDER — LACTATED RINGERS IV BOLUS (SEPSIS)
1000.0000 mL | Freq: Once | INTRAVENOUS | Status: AC
Start: 2023-08-16 — End: 2023-08-16
  Administered 2023-08-16: 1000 mL via INTRAVENOUS

## 2023-08-16 MED ORDER — VANCOMYCIN HCL IN DEXTROSE 1-5 GM/200ML-% IV SOLN
1000.0000 mg | INTRAVENOUS | Status: AC
  Administered 2023-08-16 (×2): 1000 mg via INTRAVENOUS
  Filled 2023-08-16: qty 200

## 2023-08-16 MED ORDER — SODIUM CHLORIDE 0.9 % IV SOLN
2.0000 g | Freq: Once | INTRAVENOUS | Status: AC
  Administered 2023-08-16: 2 g via INTRAVENOUS
  Filled 2023-08-16: qty 12.5

## 2023-08-16 MED ORDER — IBUPROFEN 400 MG PO TABS
600.0000 mg | ORAL_TABLET | Freq: Once | ORAL | Status: AC
  Administered 2023-08-16: 600 mg via ORAL
  Filled 2023-08-16: qty 2

## 2023-08-16 MED ORDER — VANCOMYCIN HCL IN DEXTROSE 1-5 GM/200ML-% IV SOLN: 1000.0000 mg | Freq: Two times a day (BID) | INTRAVENOUS | Status: DC

## 2023-08-16 MED ORDER — LACTATED RINGERS IV SOLN: INTRAVENOUS | Status: DC

## 2023-08-16 MED ORDER — ACETAMINOPHEN 325 MG PO TABS
650.0000 mg | ORAL_TABLET | Freq: Once | ORAL | Status: AC
  Administered 2023-08-16: 650 mg via ORAL
  Filled 2023-08-16: qty 2

## 2023-08-16 MED ORDER — ACETAMINOPHEN 325 MG PO TABS
325.0000 mg | ORAL_TABLET | Freq: Once | ORAL | Status: AC
  Administered 2023-08-16: 325 mg via ORAL
  Filled 2023-08-16: qty 1

## 2023-08-16 MED ORDER — SODIUM CHLORIDE 0.9 % IV SOLN: 2.0000 g | Freq: Three times a day (TID) | INTRAVENOUS | Status: DC

## 2023-08-16 NOTE — ED Notes (Signed)
MD made aware of pts temperature of 103.2 F and HR in the 120s-130s. EKG given to MD, MD at bedside

## 2023-08-16 NOTE — ED Notes (Signed)
Pt given a urinal and informed of need for urine specimen

## 2023-08-16 NOTE — Discharge Instructions (Addendum)
You have COVID.  Continue supportive care with ibuprofen, Tylenol, and hydration.  Today you had urine retention.  Keep Foley catheter in place until you see the urologist in the office.  Call the telephone number below to set up that follow-up appointment.  Return to the emergency department for any new or worsening symptoms of concern.  Your CT scan showed a lung nodule.  You should get this reimaged in 6 to 12 months.  Talk to your primary care doctor about this.

## 2023-08-16 NOTE — ED Provider Notes (Signed)
[place] EMERGENCY DEPARTMENT AT [facility] Provider Note   CSN: [id number] Arrival date & time: 08/16/23  4098     History  Chief Complaint  Patient presents with   Tremors    Vincent Anthony is a 52 y.o. male.  HPI Patient presents for multiple complaints.  Medical history includes arthritis, depression, DM, GERD, MS, neurogenic bladder, seizures.  He previously required use of a Foley catheter but states that he is currently able to urinate at will.  Last night, he was in his normal state of health.  This morning, he woke up at 5 AM with symptoms of right-sided headache, shortness of breath, chills and rigors, and watery diarrhea.  He is bedbound at baseline.  He denies any chronic wounds.  He is not currently on any immunosuppressive medications for his MS.    Home Medications Prior to Admission medications   Medication Sig Start Date End Date Taking? Authorizing Provider  amitriptyline (ELAVIL) 10 MG tablet Take 1 tablet (10 mg total) by mouth at bedtime. Hold until linezolid course has finished Patient taking differently: Take 10 mg by mouth at bedtime. 01/06/21  Yes Pokhrel, Laxman, MD  artificial tears (LACRILUBE) OINT ophthalmic ointment Place into both eyes every 4 (four) hours as needed for dry eyes. 01/06/21  Yes Pokhrel, Laxman, MD  baclofen (LIORESAL) 10 MG tablet Take 1 tablet by mouth 3 (three) times daily. 11/09/14  Yes [provider]  bisacodyl 5 MG EC tablet As directed 09/10/22  Yes Emokpae, Courage, MD  cetirizine (ZYRTEC) 10 MG tablet Take 10 mg by mouth daily.   Yes [provider]  Cholecalciferol (VITAMIN D3) 50 MCG (2000 UT) TABS Take 2,000 Units by mouth daily.   Yes [provider]  clonazePAM (KLONOPIN) 1 MG tablet Take 1 mg by mouth 2 (two) times daily.   Yes [provider]  Emollient (EUCERIN) lotion Apply 1 Application topically every 12 (twelve) hours as needed for dry skin.   Yes [provider]  famotidine (PEPCID) 20 MG tablet Take 20 mg by mouth 2 (two) times daily.   Yes [provider]  fluticasone (FLONASE) 50 MCG/ACT nasal spray Place 1 spray into both nostrils daily.   Yes [provider]  hyoscyamine (LEVBID) 0.375 MG 12 hr tablet TAKE (1) TABLET BYMOUTH EVERY TWELVE HOURS. 09/16/20  Yes Gelene Mink, NP  levETIRAcetam (KEPPRA) 250 MG tablet Take 250 mg by mouth 2 (two) times daily. 11/09/14  Yes [provider]  lubiprostone (AMITIZA) 8 MCG capsule Take by mouth. 05/07/23  Yes [provider]  metFORMIN (GLUCOPHAGE) 500 MG tablet Take 500 mg by mouth 2 (two) times daily.   Yes [provider]  modafinil (PROVIGIL) 200 MG tablet Take 200 mg by mouth daily.   Yes [provider]  Multiple Vitamin (MULTIVITAMIN WITH MINERALS) TABS tablet Take 1 tablet by mouth daily.   Yes [provider]  ondansetron (ZOFRAN) 4 MG tablet Take 4 mg by mouth every 8 (eight) hours as needed for nausea or vomiting.   Yes [provider]  pantoprazole (PROTONIX) 40 MG tablet Take 1 tablet (40 mg total) by mouth at bedtime. 01/06/21  Yes Pokhrel, Laxman, MD  polyethylene glycol (MIRALAX / GLYCOLAX) 17 g packet Take 17 g by mouth 2 (two) times daily. Patient taking differently: Take 8.5 g by mouth See admin instructions. Monday,Wednesday Friday and Sunday 09/10/22  Yes Shon Hale, MD  pravastatin (PRAVACHOL) 20 MG tablet Take  20 mg by mouth daily.   Yes [provider]  pregabalin (LYRICA) 150 MG capsule Take 150 mg by mouth at bedtime. 02/06/22  Yes [provider]  pregabalin (LYRICA) 75 MG capsule Take 75 mg by mouth daily.   Yes [provider]  senna (SENOKOT) 8.6 MG tablet Take 2 tablets (17.2 mg total) by mouth 2 (two) times daily. Hold for loose stools >2/day 09/10/22  Yes Emokpae, Courage, MD      Allergies    Celebrex [celecoxib], Clindamycin, Vioxx [rofecoxib], and Zanaflex [tizanidine hcl]     Review of Systems   Review of Systems  Constitutional:  Positive for chills, fatigue and fever.  Respiratory:  Positive for shortness of breath.   Gastrointestinal:  Positive for diarrhea.  Neurological:  Positive for headaches.  All other systems reviewed and are negative.   Physical Exam Updated Vital Signs BP 102/64   Pulse (!) 104   Temp 99 F (37.2 C) (Oral)   Resp 20   Ht 6\' 3"  (1.905 m)   Wt 109.3 kg   SpO2 94%   BMI 30.12 kg/m  Physical Exam Vitals and nursing note reviewed.  Constitutional:      General: He is not in acute distress.    Appearance: Normal appearance. He is well-developed. He is not toxic-appearing or diaphoretic.  HENT:     Head: Normocephalic and atraumatic.     Right Ear: External ear normal.     Left Ear: External ear normal.     Nose: Nose normal.     Mouth/Throat:     Mouth: Mucous membranes are moist.  Eyes:     Extraocular Movements: Extraocular movements intact.     Conjunctiva/sclera: Conjunctivae normal.  Cardiovascular:     Rate and Rhythm: Regular rhythm. Tachycardia present.  Pulmonary:     Effort: Pulmonary effort is normal. No respiratory distress.     Breath sounds: Normal breath sounds. No wheezing or rales.  Chest:     Chest wall: No tenderness.  Abdominal:     General: There is no distension.     Palpations: Abdomen is soft.     Tenderness: There is no abdominal tenderness.  Musculoskeletal:        General: No swelling or deformity.     Cervical back: Normal range of motion and neck supple.  Skin:    General: Skin is warm and dry.     Coloration: Skin is not jaundiced or pale.  Neurological:     Mental Status: He is alert and oriented to person, place, and time. Mental status is at baseline.  Psychiatric:        Mood and Affect: Mood normal.        Behavior: Behavior normal.     ED Results / Procedures / Treatments   Labs (all labs ordered are listed, but only abnormal results are displayed) Labs Reviewed   RESP PANEL BY RT-PCR (RSV, FLU A&B, COVID)  [id number] - Abnormal; Notable for the following components:      Result Value   SARS Coronavirus 2 by RT PCR POSITIVE (*)    All other components within normal limits  COMPREHENSIVE METABOLIC PANEL - Abnormal; Notable for the following components:   Glucose, Bld 129 (*)    Creatinine, Ser 1.26 (*)    Calcium 8.8 (*)    Alkaline Phosphatase 150 (*)    All other components within normal limits  LACTIC ACID, PLASMA - Abnormal; Notable for the following components:  Lactic Acid, Venous 2.6 (*)    All other components within normal limits  LACTIC ACID, PLASMA - Abnormal; Notable for the following components:   Lactic Acid, Venous 2.2 (*)    All other components within normal limits  CBC WITH DIFFERENTIAL/PLATELET - Abnormal; Notable for the following components:   WBC 11.6 (*)    RDW 17.2 (*)    Neutro Abs 10.7 (*)    Lymphs Abs 0.5 (*)    All other components within normal limits  CULTURE, BLOOD (ROUTINE X 2)  CULTURE, BLOOD (ROUTINE X 2)  C DIFFICILE QUICK SCREEN W PCR REFLEX    PROTIME-INR  URINALYSIS, W/ REFLEX TO CULTURE (INFECTION SUSPECTED)    EKG EKG Interpretation Date/Time:  Monday August 16 2023 09:43:08 EST Ventricular Rate:  124 PR Interval:  148 QRS Duration:  77 QT Interval:  262 QTC Calculation: 377 R Axis:   216  Text Interpretation: Sinus tachycardia Low voltage with right axis deviation Consider anterior infarct Confirmed by Gloris Manchester [phone]) on 08/16/2023 1:57:01 PM  Radiology CT CHEST ABDOMEN PELVIS WO CONTRAST  Result Date: 08/16/2023 CLINICAL DATA:  Sepsis. Complaint of tremors, shortness of breath. Right-sided headache, diarrhea that started this morning. EXAM: CT CHEST, ABDOMEN AND PELVIS WITHOUT CONTRAST TECHNIQUE: Multidetector CT imaging of the chest, abdomen and pelvis was performed following the standard protocol without IV contrast. RADIATION DOSE REDUCTION: This exam was performed according to the  departmental dose-optimization program which includes automated exposure control, adjustment of the mA and/or kV according to patient size and/or use of iterative reconstruction technique. COMPARISON:  CT scan angiography chest, abdomen and pelvis from 12/29/2020. FINDINGS: CT CHEST FINDINGS Cardiovascular: Normal cardiac size. No pericardial effusion. No aortic aneurysm. Mediastinum/Nodes: Visualized thyroid gland appears grossly unremarkable. No solid / cystic mediastinal masses. The esophagus is nondistended precluding optimal assessment. No axillary, mediastinal or hilar lymphadenopathy by size criteria. Lungs/Pleura: The central tracheo-bronchial tree is patent. There are patchy areas of linear, plate-like atelectasis and/or scarring throughout bilateral lungs with lower lobe predominance. No mass or consolidation. No pleural effusion or pneumothorax. There are at least 2, stable, subcentimeter sized noncalcified opacities in the minor fissure, favored to represent intra fissural lymph node. There is a pleural-based 4.8 x 5.9 mm noncalcified nodule in the middle lobe (series 9, image 81). The nodule was seen on the prior exam from 12/29/2020 however, images were markedly degraded due to motion. The nodule was not clearly seen on the exam from November 25, 2020. Please see follow-up recommendations below. No other new or suspicious lung nodule. Musculoskeletal: The visualized soft tissues of the chest wall are grossly unremarkable. No suspicious osseous lesions. There are mild multilevel degenerative changes in the visualized spine. CT ABDOMEN PELVIS FINDINGS Hepatobiliary: The liver is normal in size. Non-cirrhotic configuration. No suspicious mass. No intrahepatic or extrahepatic bile duct dilation. No calcified gallstones. Normal gallbladder wall thickness. No pericholecystic inflammatory changes. Pancreas: Unremarkable. No pancreatic ductal dilatation or surrounding inflammatory changes. Spleen: Within  normal limits. No focal lesion. Adrenals/Urinary Tract: Adrenal glands are unremarkable. No suspicious renal mass within the limitations of this unenhanced exam. No hydronephrosis. There is a 3 mm nonobstructing calculus in the right kidney lower pole calyx. No other renal or ureteric calculi. Unremarkable urinary bladder. Stomach/Bowel: No disproportionate dilation of the small or large bowel loops. No evidence of abnormal bowel wall thickening or inflammatory changes. The appendix is unremarkable. Vascular/Lymphatic: No ascites or pneumoperitoneum. No abdominal or pelvic lymphadenopathy, by size criteria. No aneurysmal dilation  of the major abdominal arteries. Reproductive: Normal size prostate. Symmetric seminal vesicles. Prosthetic left testicle noted. Other: The visualized soft tissues and abdominal wall are unremarkable. Musculoskeletal: No suspicious osseous lesions. There are mild multilevel degenerative changes in the visualized spine. IMPRESSION: *No acute process identified within the chest, abdomen or pelvis. *There is a pleural-based sub 6 mm noncalcified nodule, as described above. Please see follow-up recommendations below. *Multiple other nonacute observations, as described above. Pulmonary nodule follow-up recommendation: Solid lung nodule measuring up to 6 mm: - LOW RISK: No routine follow-up. - HIGHER RISK: Optional CT at 12 months. Recommendations for evaluation of incidental nodules derived from guidelines developed by the Fleischner Society ( Radiology 2017; [phone]). These guidelines apply to incidental nodules, which can be managed according to the specific recommendations. These guidelines do not apply to patient's younger than 35 years, immunocompromised patients, or patients with cancer. For lung cancer screening, adherence to the existing [employer] of radiology lung CT Screening Reporting and Data System (lung-RADS) guidelines is recommended. High risk factors include older  age, heavy smoking, larger nodule size, irregular or spiculated margins and upper lobe location. Clinical risk factors include smoking, exposure to other carcinogens, emphysema, fibrosis, upper lobe location, family history of lung cancer, age and sex. Electronically Signed   By: Jules Schick M.D.   On: 08/16/2023 13:29   DG Chest Port 1 View  Result Date: 08/16/2023 CLINICAL DATA:  Questionable sepsis. Evaluate for abnormality. COVID positive. EXAM: PORTABLE CHEST 1 VIEW COMPARISON:  07/19/2023. FINDINGS: Linear area of atelectasis and/or scarring again seen overlying the right lower lung zone. There are probable atelectatic changes at the lung bases. Bilateral lung fields are otherwise clear. No dense consolidation or lung collapse. No pulmonary edema. Bilateral lateral costophrenic angles are clear. Normal cardio-mediastinal silhouette. No acute osseous abnormalities. The soft tissues are within normal limits. IMPRESSION: *No active disease. Electronically Signed   By: Jules Schick M.D.   On: 08/16/2023 13:14   CT Head Wo Contrast  Result Date: 08/16/2023 CLINICAL DATA:  Headache, new onset (Age >= 51y). EXAM: CT HEAD WITHOUT CONTRAST TECHNIQUE: Contiguous axial images were obtained from the base of the skull through the vertex without intravenous contrast. RADIATION DOSE REDUCTION: This exam was performed according to the departmental dose-optimization program which includes automated exposure control, adjustment of the mA and/or kV according to patient size and/or use of iterative reconstruction technique. COMPARISON:  MRI brain 10/29/2020. FINDINGS: Brain: No acute hemorrhage. Unchanged right-greater-than-left cerebral white matter lesions, accounting for differences in modality, consistent with known demyelinating disease. No loss of gray-white differentiation. No hydrocephalus or extra-axial collection. No mass effect or midline shift. Vascular: No hyperdense vessel or unexpected calcification.  Skull: No calvarial fracture or suspicious bone lesion. Skull base is unremarkable. Sinuses/Orbits: No acute finding. Other: None. IMPRESSION: 1. No acute intracranial abnormality. 2. Unchanged right-greater-than-left cerebral white matter lesions, consistent with known demyelinating disease. Electronically Signed   By: Orvan Falconer M.D.   On: 08/16/2023 11:45    Procedures Procedures    Medications Ordered in ED Medications  lactated ringers infusion ( Intravenous New Bag/Given 08/16/23 1249)  ceFEPIme (MAXIPIME) 2 g in sodium chloride 0.9 % 100 mL IVPB (has no administration in time range)  vancomycin (VANCOCIN) IVPB 1000 mg/200 mL premix (has no administration in time range)  lactated ringers bolus 1,000 mL (0 mLs Intravenous Stopped 08/16/23 1241)  ceFEPIme (MAXIPIME) 2 g in sodium chloride 0.9 % 100 mL IVPB (0 g Intravenous Stopped 08/16/23 1057)  metroNIDAZOLE (FLAGYL) IVPB 500 mg (0 mg Intravenous Stopped 08/16/23 1150)  acetaminophen (TYLENOL) tablet 650 mg (650 mg Oral Given 08/16/23 1020)  vancomycin (VANCOCIN) IVPB 1000 mg/200 mL premix (0 mg Intravenous Stopped 08/16/23 1414)  lactated ringers bolus 1,000 mL (1,000 mLs Intravenous Bolus 08/16/23 1146)  ibuprofen (ADVIL) tablet 600 mg (600 mg Oral Given 08/16/23 1249)  acetaminophen (TYLENOL) tablet 325 mg (325 mg Oral Given 08/16/23 1252)  lactated ringers bolus 1,000 mL (1,000 mLs Intravenous Bolus 08/16/23 1300)    ED Course/ Medical Decision Making/ A&P                                 Medical Decision Making Amount and/or Complexity of Data Reviewed Labs: ordered. Radiology: ordered.  Risk OTC drugs. Prescription drug management.   This patient presents to the ED for concern of chills, headache, diarrhea, this involves an extensive number of treatment options, and is a complaint that carries with it a high risk of complications and morbidity.  The differential diagnosis includes dehydration, polypharmacy, URI,  colitis, pneumonia   Co morbidities that complicate the patient evaluation  arthritis, depression, DM, GERD, MS, neurogenic bladder, seizures   Additional history obtained:  Additional history obtained from N/A External records from outside source obtained and reviewed including EMR   Lab Tests:  I Ordered, and personally interpreted labs.  The pertinent results include: Mild leukocytosis, normal hemoglobin, baseline creatinine, normal electrolytes, no evidence of UTI.  Patient did test positive for COVID-19.   Imaging Studies ordered:  I ordered imaging studies including chest x-ray, CT of head, chest, abdomen, pelvis I independently visualized and interpreted imaging which showed no acute findings.  Incidental finding of pulmonary nodule. I agree with the radiologist interpretation   Cardiac Monitoring: / EKG:  The patient was maintained on a cardiac monitor.  I personally viewed and interpreted the cardiac monitored which showed an underlying rhythm of: Sinus rhythm  Problem List / ED Course / Critical interventions / Medication management  Patient presenting for new onset of headache, diarrhea, and what was described as tremors.  Patient states that this was in the setting of severe chills.  He is found to be febrile, tachycardic, and tachypneic on arrival.  He meets sepsis criteria.  IV fluids and broad-spectrum antibiotics were initiated.  Workup was initiated.  Tylenol was ordered for antipyresis and headache.  Patient is currently alert and oriented.  He states that he felt fine last night.  He has chronic global weakness from his MS. after initial dose of Tylenol, patient had continued fever.  Additional Tylenol and ibuprofen were ordered.  Initial lactate was elevated.  Additional IV fluids were ordered.  Patient underwent CT imaging.  On CT scan, he had approximately half a liter of urine in his bladder.  At this time, he was unable to urinate.  Foley catheter was placed  for urine retention.  His urine did not show evidence of infection.  He did test positive for COVID-19.  This may be the source of his SIRS on arrival.  CT imaging did not show any other sources of infection.  He did have resolution of fever and tachycardia.  His symptoms resolved.  Patient is currently not on any immunosuppressive medications for his MS.  He was advised to continue supportive care with ibuprofen, Tylenol, and p.o. hydration.  Foley catheter to be left in place for urine retention.  Patient to follow-up  with urology for trial of voiding.  He was discharged in stable condition. I ordered medication including IV fluids and broad-spectrum antibiotics for empiric treatment of sepsis; Tylenol and ibuprofen for antipyresis Reevaluation of the patient after these medicines showed that the patient resolved I have reviewed the patients home medicines and have made adjustments as needed   Social Determinants of Health:  Resides in nursing facility        Final Clinical Impression(s) / ED Diagnoses Final diagnoses:  COVID-19  Urine retention    Rx / DC Orders ED Discharge Orders     None         Gloris Manchester, MD 08/16/23 1600

## 2023-08-16 NOTE — ED Notes (Addendum)
Pts R arms contracted, per pt, from MS. States he has not prior Hx of stroke. Also states he uses an electric wheelchair to get around. Pt came with pressure boots on bilateral feet

## 2023-08-16 NOTE — Progress Notes (Addendum)
Pharmacy Antibiotic Note  Vincent Anthony is a 52 y.o. male admitted on 08/16/2023 with sepsis.  Pharmacy has been consulted for antibiotic dosing.  Plan: Vancomycin 2000mg  IV x 1 dose  Vancomycin 1000 mg q12 hour  Cefepime 2g q8h   Height: 6\' 3"  (190.5 cm) Weight: 109.3 kg (241 lb) IBW/kg (Calculated) : 84.5  Temp (24hrs), Avg:103.2 F (39.6 C), Min:103.2 F (39.6 C), Max:103.2 F (39.6 C)  Recent Labs  Lab 08/16/23 1007  WBC 11.6*  CREATININE 1.26*  LATICACIDVEN 2.6*    Estimated Creatinine Clearance: 91.6 mL/min (A) (by C-G formula based on SCr of 1.26 mg/dL (H)).    Allergies  Allergen Reactions   Celebrex [Celecoxib] Nausea And Vomiting   Clindamycin Diarrhea   Vioxx [Rofecoxib] Nausea And Vomiting   Zanaflex [Tizanidine Hcl] Nausea And Vomiting    Antimicrobials this admission: vancomycin 11/18 >>  cefepime 11/18 >>  Metronidazole 11/18>>  Microbiology results: 11/18 BCx: pending   11/18 Cdiff: pending 11/18 Covid: Positive  Thank you for allowing pharmacy to be a part of this patient's care.  Adrian Prows- PharmD Student 08/16/2023 10:49 AM

## 2023-08-16 NOTE — ED Notes (Signed)
Vitals post 1 L fluid bolus and post po tylenol:   Temperature: 103 F   Pulse: 124  MD made aware

## 2023-08-16 NOTE — ED Notes (Signed)
Pt has yet to have a BM to collect a stool sample at this time

## 2023-08-16 NOTE — ED Triage Notes (Signed)
Pt arrived via [facility] from [facility]  c/o tremors, sob, R side HA, diarrhea that all statrted at around 5 am-7am this morning. Two-three episodes of diarrhea today per pt, states "I just feel overall unwell", denies CP, denies dizziness, Hx of MS with R arm contracted due to MS per pt. Per EMS cbg 119, pt also had a GI prep last week but never was able to get it done. 95% RA, was sent here to be assessed for dehydration

## 2023-08-16 NOTE — ED Notes (Signed)
Airborne precautions sign placed on pts Rm door, Covid positive

## 2023-08-16 NOTE — ED Notes (Signed)
Foley catheter emptied before transport back to SNF

## 2023-08-16 NOTE — ED Notes (Signed)
Oral Tylenol given for temperature of 103.2 F

## 2023-08-17 ENCOUNTER — Encounter (HOSPITAL_COMMUNITY): Payer: Self-pay | Admitting: Internal Medicine

## 2023-08-18 DIAGNOSIS — U071 COVID-19: Secondary | ICD-10-CM | POA: Diagnosis not present

## 2023-08-18 DIAGNOSIS — J069 Acute upper respiratory infection, unspecified: Secondary | ICD-10-CM | POA: Diagnosis not present

## 2023-08-19 DIAGNOSIS — R339 Retention of urine, unspecified: Secondary | ICD-10-CM | POA: Diagnosis not present

## 2023-08-19 DIAGNOSIS — K5909 Other constipation: Secondary | ICD-10-CM | POA: Diagnosis not present

## 2023-08-19 DIAGNOSIS — E1143 Type 2 diabetes mellitus with diabetic autonomic (poly)neuropathy: Secondary | ICD-10-CM | POA: Diagnosis not present

## 2023-08-19 DIAGNOSIS — F411 Generalized anxiety disorder: Secondary | ICD-10-CM | POA: Diagnosis not present

## 2023-08-19 DIAGNOSIS — M1991 Primary osteoarthritis, unspecified site: Secondary | ICD-10-CM | POA: Diagnosis not present

## 2023-08-19 DIAGNOSIS — R569 Unspecified convulsions: Secondary | ICD-10-CM | POA: Diagnosis not present

## 2023-08-19 DIAGNOSIS — U071 COVID-19: Secondary | ICD-10-CM | POA: Diagnosis not present

## 2023-08-19 DIAGNOSIS — G35 Multiple sclerosis: Secondary | ICD-10-CM | POA: Diagnosis not present

## 2023-08-20 DIAGNOSIS — U071 COVID-19: Secondary | ICD-10-CM | POA: Diagnosis not present

## 2023-08-20 DIAGNOSIS — J069 Acute upper respiratory infection, unspecified: Secondary | ICD-10-CM | POA: Diagnosis not present

## 2023-08-21 LAB — CULTURE, BLOOD (ROUTINE X 2)
Culture: NO GROWTH
Culture: NO GROWTH
Special Requests: ADEQUATE
Special Requests: ADEQUATE

## 2023-08-23 DIAGNOSIS — F411 Generalized anxiety disorder: Secondary | ICD-10-CM | POA: Diagnosis not present

## 2023-08-23 DIAGNOSIS — F330 Major depressive disorder, recurrent, mild: Secondary | ICD-10-CM | POA: Diagnosis not present

## 2023-08-25 DIAGNOSIS — U071 COVID-19: Secondary | ICD-10-CM | POA: Diagnosis not present

## 2023-08-25 DIAGNOSIS — R339 Retention of urine, unspecified: Secondary | ICD-10-CM | POA: Diagnosis not present

## 2023-08-30 DIAGNOSIS — F330 Major depressive disorder, recurrent, mild: Secondary | ICD-10-CM | POA: Diagnosis not present

## 2023-08-30 DIAGNOSIS — R339 Retention of urine, unspecified: Secondary | ICD-10-CM | POA: Diagnosis not present

## 2023-08-30 DIAGNOSIS — F411 Generalized anxiety disorder: Secondary | ICD-10-CM | POA: Diagnosis not present

## 2023-08-31 ENCOUNTER — Ambulatory Visit: Payer: Medicare HMO | Admitting: Urology

## 2023-09-01 DIAGNOSIS — N139 Obstructive and reflux uropathy, unspecified: Secondary | ICD-10-CM | POA: Diagnosis not present

## 2023-09-01 DIAGNOSIS — R339 Retention of urine, unspecified: Secondary | ICD-10-CM | POA: Diagnosis not present

## 2023-09-02 DIAGNOSIS — R8290 Unspecified abnormal findings in urine: Secondary | ICD-10-CM | POA: Diagnosis not present

## 2023-09-02 DIAGNOSIS — Z13220 Encounter for screening for lipoid disorders: Secondary | ICD-10-CM | POA: Diagnosis not present

## 2023-09-02 DIAGNOSIS — C224 Other sarcomas of liver: Secondary | ICD-10-CM | POA: Diagnosis not present

## 2023-09-02 DIAGNOSIS — R399 Unspecified symptoms and signs involving the genitourinary system: Secondary | ICD-10-CM | POA: Diagnosis not present

## 2023-09-03 DIAGNOSIS — N3000 Acute cystitis without hematuria: Secondary | ICD-10-CM | POA: Diagnosis not present

## 2023-09-03 DIAGNOSIS — F411 Generalized anxiety disorder: Secondary | ICD-10-CM | POA: Diagnosis not present

## 2023-09-03 DIAGNOSIS — F339 Major depressive disorder, recurrent, unspecified: Secondary | ICD-10-CM | POA: Diagnosis not present

## 2023-09-03 DIAGNOSIS — N139 Obstructive and reflux uropathy, unspecified: Secondary | ICD-10-CM | POA: Diagnosis not present

## 2023-09-06 DIAGNOSIS — N139 Obstructive and reflux uropathy, unspecified: Secondary | ICD-10-CM | POA: Diagnosis not present

## 2023-09-06 DIAGNOSIS — F411 Generalized anxiety disorder: Secondary | ICD-10-CM | POA: Diagnosis not present

## 2023-09-06 DIAGNOSIS — N3000 Acute cystitis without hematuria: Secondary | ICD-10-CM | POA: Diagnosis not present

## 2023-09-06 DIAGNOSIS — F330 Major depressive disorder, recurrent, mild: Secondary | ICD-10-CM | POA: Diagnosis not present

## 2023-09-08 DIAGNOSIS — N3000 Acute cystitis without hematuria: Secondary | ICD-10-CM | POA: Diagnosis not present

## 2023-09-11 DIAGNOSIS — I10 Essential (primary) hypertension: Secondary | ICD-10-CM | POA: Diagnosis not present

## 2023-09-13 DIAGNOSIS — F330 Major depressive disorder, recurrent, mild: Secondary | ICD-10-CM | POA: Diagnosis not present

## 2023-09-13 DIAGNOSIS — F411 Generalized anxiety disorder: Secondary | ICD-10-CM | POA: Diagnosis not present

## 2023-09-13 NOTE — Progress Notes (Unsigned)
Name: Vincent Anthony DOB: 09-18-1971 MRN: [id number]  History of Present Illness: Mr. Kossman is a 52 y.o. male who presents today as a new patient at [facility]. All available relevant medical records have been reviewed. ***He is accompanied by ***. - GU history includes:  1. Neurogenic bladder with urinary urgency and urge incontinence; secondary to multiple sclerosis.  - Previously followed by Urology at [facility].  - ***Previous treatments have included Vesicare  and Oxybutynin.  - Prior episode of urinary retention, possibly related to Oxybutynin use. - ***Has done / ***Does double voiding with improvement.  2. Kidney stone(s). 3. Solitary right testicle, acquired.  - 06/12/2013: Underwent left laparoscopic orchiectomy.  - Has a prosthetic left testicle.  ***on 03/13/2021 appt w/ Dr. Gillian Shields: "- Reviewed options for catheter vs SPT for urinary retention. Patient would like to hold off on this for now.  - If PVR low, and bothered by incontinence, suggest UDS +/- restarting anticholinergic - If PVR elevated, suggest UDS only to start."  He reports chief complaint of urinary retention.  Recent history:  > 08/16/2023: Seen in ER for COVID-19. Found to be in urinary retention also.  - Treated with IV fluids and broad-spectrum antibiotics. - "On CT scan, he had approximately half a liter of urine in his bladder. At this time, he was unable to urinate. Foley catheter was placed for urine retention.  His urine did not show evidence of infection." - CT chest/abdomen/pelvis w/o contrast showed no GU masses or hydronephrosis. Has a nonobstructing 3 mm lower pole right kidney stone. Unremarkable urinary bladder. Normal size prostate. Symmetric seminal vesicles. Prosthetic left testicle noted.  Today: He reports the catheter is draining***.  He {Actions; denies-reports:120008} gross hematuria.  He {Actions; denies-reports:120008} flank pain  or abdominal pain. He {Actions; denies-reports:120008} fevers, nausea, or vomiting.  He {Actions; denies-reports:120008} constipation. He *** taking laxatives, stool softeners, or fiber supplements.  ***able to void volitionally most of the time? ***taking Flomax? ***Consider UDT   Fall Screening: Do you usually have a device to assist in your mobility? {yes/no:20286} ***cane / ***walker / ***wheelchair   Medications: Current Outpatient Medications  Medication Sig Dispense Refill   amitriptyline (ELAVIL) 10 MG tablet Take 1 tablet (10 mg total) by mouth at bedtime. Hold until linezolid course has finished (Patient taking differently: Take 10 mg by mouth at bedtime.)     artificial tears (LACRILUBE) OINT ophthalmic ointment Place into both eyes every 4 (four) hours as needed for dry eyes.     baclofen (LIORESAL) 10 MG tablet Take 1 tablet by mouth 3 (three) times daily.     bisacodyl 5 MG EC tablet As directed 30 tablet 3   cetirizine (ZYRTEC) 10 MG tablet Take 10 mg by mouth daily.     Cholecalciferol (VITAMIN D3) 50 MCG (2000 UT) TABS Take 2,000 Units by mouth daily.     clonazePAM (KLONOPIN) 1 MG tablet Take 1 mg by mouth 2 (two) times daily.     Emollient (EUCERIN) lotion Apply 1 Application topically every 12 (twelve) hours as needed for dry skin.     famotidine (PEPCID) 20 MG tablet Take 20 mg by mouth 2 (two) times daily.     fluticasone (FLONASE) 50 MCG/ACT nasal spray Place 1 spray into both nostrils daily.     hyoscyamine (LEVBID) 0.375 MG 12 hr tablet TAKE (1) TABLET BYMOUTH EVERY TWELVE HOURS. 60 tablet 11   levETIRAcetam (KEPPRA) 250 MG tablet Take 250  mg by mouth 2 (two) times daily.     lubiprostone (AMITIZA) 8 MCG capsule Take by mouth.     metFORMIN (GLUCOPHAGE) 500 MG tablet Take 500 mg by mouth 2 (two) times daily.     modafinil (PROVIGIL) 200 MG tablet Take 200 mg by mouth daily.     Multiple Vitamin (MULTIVITAMIN WITH MINERALS) TABS tablet Take 1 tablet by mouth  daily.     ondansetron (ZOFRAN) 4 MG tablet Take 4 mg by mouth every 8 (eight) hours as needed for nausea or vomiting.     pantoprazole (PROTONIX) 40 MG tablet Take 1 tablet (40 mg total) by mouth at bedtime. 30 tablet    polyethylene glycol (MIRALAX / GLYCOLAX) 17 g packet Take 17 g by mouth 2 (two) times daily. (Patient taking differently: Take 8.5 g by mouth See admin instructions. Monday,Wednesday Friday and Sunday) 60 each 4   pravastatin (PRAVACHOL) 20 MG tablet Take 20 mg by mouth daily.     pregabalin (LYRICA) 150 MG capsule Take 150 mg by mouth at bedtime.     pregabalin (LYRICA) 75 MG capsule Take 75 mg by mouth daily.     senna (SENOKOT) 8.6 MG tablet Take 2 tablets (17.2 mg total) by mouth 2 (two) times daily. Hold for loose stools >2/day 120 tablet 2   No current facility-administered medications for this visit.    Allergies: Allergies  Allergen Reactions   Celebrex [Celecoxib] Nausea And Vomiting   Clindamycin Diarrhea   Vioxx [Rofecoxib] Nausea And Vomiting   Zanaflex [Tizanidine Hcl] Nausea And Vomiting    Past Medical History:  Diagnosis Date   Arthritis    Depression    Diabetes mellitus without complication (HCC)    Elevated LFTs    Elevated liver enzymes    GERD (gastroesophageal reflux disease)    Headache    MS (multiple sclerosis) (HCC)    Neurogenic bladder    Neuromuscular disorder (HCC)    Tremor    Past Surgical History:  Procedure Laterality Date   COLONOSCOPY  2014   COLONOSCOPY WITH PROPOFOL N/A 05/01/2022   Procedure: COLONOSCOPY WITH PROPOFOL;  Surgeon: Corbin Ade, MD;  Location: [facility];  Service: Endoscopy;  Laterality: N/A;  9:15am   FLEXIBLE SIGMOIDOSCOPY N/A 08/11/2023   Procedure: FLEXIBLE SIGMOIDOSCOPY;  Surgeon: Corbin Ade, MD;  Location: [facility];  Service: Endoscopy;  Laterality: N/A;  11:00am, asa 3 (at [facility])   HERNIA REPAIR     KNEE SURGERY     TESTICLE REMOVAL     Family History  Problem Relation  Age of Onset   Heart attack Mother    Diabetes Father    Hypertension Father    Colon cancer Neg Hx    Colonic polyp Neg Hx    Social History   Socioeconomic History   Marital status: Married    Spouse name: Not on file   Number of children: Not on file   Years of education: Not on file   Highest education level: Not on file  Occupational History   Not on file  Tobacco Use   Smoking status: Never   Smokeless tobacco: Never  Substance and Sexual Activity   Alcohol use: No    Comment: occasionally   Drug use: No   Sexual activity: Not on file  Other Topics Concern   Not on file  Social History Narrative   Not on file   Social Drivers of Health   Financial Resource Strain:  Not on file  Food Insecurity: Not on file  Transportation Needs: Not on file  Physical Activity: Not on file  Stress: Not on file  Social Connections: Not on file  Intimate Partner Violence: Not on file    SUBJECTIVE  Review of Systems Constitutional: Patient denies any unintentional weight loss or change in strength lntegumentary: Patient denies any rashes or pruritus Cardiovascular: Patient denies chest pain or syncope Respiratory: Patient denies shortness of breath Gastrointestinal: Patient ***denies nausea, vomiting, constipation, or diarrhea Musculoskeletal: Patient denies muscle cramps or weakness Neurologic: Patient denies convulsions or seizures Allergic/Immunologic: Patient denies recent allergic reaction(s) Hematologic/Lymphatic: Patient denies bleeding tendencies Endocrine: Patient denies heat/cold intolerance  GU: As per HPI.  OBJECTIVE There were no vitals filed for this visit. There is no height or weight on file to calculate BMI.  Physical Examination Constitutional: No obvious distress; patient is non-toxic appearing  Cardiovascular: No visible lower extremity edema.  Respiratory: The patient does not have audible wheezing/stridor; respirations do not appear labored   Gastrointestinal: Abdomen non-distended Musculoskeletal: Normal ROM of UEs  Skin: No obvious rashes/open sores  Neurologic: CN 2-12 grossly intact Psychiatric: Answered questions appropriately with normal affect  Hematologic/Lymphatic/Immunologic: No obvious bruises or sites of spontaneous bleeding  ***GU: Catheter draining ***clear yellow urine.   ASSESSMENT No diagnosis found. *** We discussed pt's urinary retention and possible etiologies including temporary detrusor areflexia, neurogenic bladder, ***BPH, constipation, anticholinergic medication use. Voiding trial was offered.  *** Voiding trial was done and pt was able to successfully empty bladder of the contents instilled. Foley catheter to remain out. Discussion was had about increasing water intake for the next few day to insure good voiding. Pt advised to return if unable to void, or go to ER if after clinic hours.  *** Voiding trial was performed and pt was unable to successfully empty bladder of the contents instilled. Will attempt voiding trial again in 2 weeks. If unable to pass voiding trial at that time, we will pursue further evaluation with urodynamic testing.  ***Foley catheter to remain in place. ***CIC teaching was performed by nursing staff and supplies were given/ordered from [employer]. Pt is aware they will need to perform CIC ***x/day. *** Advised pt to CIC as needed and pt is aware to call if needing to CIC regularly so we can order supplies for them. *** Topical lidocaine jelly ordered for use as lubricant for comfort; may also be applied to urethra prior to CIC.  Will plan for follow up in ***months or sooner if needed. Pt verbalized understanding and agreement. All questions were answered.  PLAN Advised the following: Foley catheter ***discontinued. ***No follow-ups on file.  No orders of the defined types were placed in this encounter.   It has been explained that the patient is to follow regularly  with their PCP in addition to all other providers involved in their care and to follow instructions provided by these respective offices. Patient advised to contact urology clinic if any urologic-pertaining questions, concerns, new symptoms or problems arise in the interim period.  There are no Patient Instructions on file for this visit.  Electronically signed by:  Donnita Falls, MSN, FNP-C, CUNP 09/13/2023 1:08 PM

## 2023-09-15 ENCOUNTER — Ambulatory Visit (INDEPENDENT_AMBULATORY_CARE_PROVIDER_SITE_OTHER): Payer: Medicare HMO | Admitting: Urology

## 2023-09-15 ENCOUNTER — Encounter: Payer: Self-pay | Admitting: Urology

## 2023-09-15 VITALS — BP 119/75 | HR 98 | Temp 97.9°F

## 2023-09-15 DIAGNOSIS — Z978 Presence of other specified devices: Secondary | ICD-10-CM

## 2023-09-15 DIAGNOSIS — Z960 Presence of urogenital implants: Secondary | ICD-10-CM | POA: Insufficient documentation

## 2023-09-15 DIAGNOSIS — N319 Neuromuscular dysfunction of bladder, unspecified: Secondary | ICD-10-CM | POA: Diagnosis not present

## 2023-09-15 DIAGNOSIS — Z87442 Personal history of urinary calculi: Secondary | ICD-10-CM | POA: Diagnosis not present

## 2023-09-15 DIAGNOSIS — R339 Retention of urine, unspecified: Secondary | ICD-10-CM | POA: Diagnosis not present

## 2023-09-15 DIAGNOSIS — N200 Calculus of kidney: Secondary | ICD-10-CM

## 2023-09-15 DIAGNOSIS — K5900 Constipation, unspecified: Secondary | ICD-10-CM | POA: Insufficient documentation

## 2023-09-15 DIAGNOSIS — F411 Generalized anxiety disorder: Secondary | ICD-10-CM | POA: Diagnosis not present

## 2023-09-15 LAB — BLADDER SCAN AMB NON-IMAGING: Scan Result: 167

## 2023-09-15 LAB — URINALYSIS, ROUTINE W REFLEX MICROSCOPIC
Bilirubin, UA: NEGATIVE
Glucose, UA: NEGATIVE
Ketones, UA: NEGATIVE
Leukocytes,UA: NEGATIVE
Nitrite, UA: NEGATIVE
Protein,UA: NEGATIVE
RBC, UA: NEGATIVE
Specific Gravity, UA: 1.015 (ref 1.005–1.030)
Urobilinogen, Ur: 0.2 mg/dL (ref 0.2–1.0)
pH, UA: 6 (ref 5.0–7.5)

## 2023-09-15 MED ORDER — CIPROFLOXACIN HCL 500 MG PO TABS: 500.0000 mg | ORAL_TABLET | Freq: Once | ORAL | Status: DC

## 2023-09-15 NOTE — Progress Notes (Signed)
 post void residual=167

## 2023-09-20 ENCOUNTER — Ambulatory Visit: Payer: Medicare HMO | Admitting: Internal Medicine

## 2023-09-20 DIAGNOSIS — F411 Generalized anxiety disorder: Secondary | ICD-10-CM | POA: Diagnosis not present

## 2023-09-20 DIAGNOSIS — F330 Major depressive disorder, recurrent, mild: Secondary | ICD-10-CM | POA: Diagnosis not present

## 2023-09-21 DIAGNOSIS — F4323 Adjustment disorder with mixed anxiety and depressed mood: Secondary | ICD-10-CM | POA: Diagnosis not present

## 2023-09-21 DIAGNOSIS — F411 Generalized anxiety disorder: Secondary | ICD-10-CM | POA: Diagnosis not present

## 2023-09-21 DIAGNOSIS — G4089 Other seizures: Secondary | ICD-10-CM | POA: Diagnosis not present

## 2023-09-24 DIAGNOSIS — G894 Chronic pain syndrome: Secondary | ICD-10-CM | POA: Diagnosis not present

## 2023-09-24 DIAGNOSIS — G629 Polyneuropathy, unspecified: Secondary | ICD-10-CM | POA: Diagnosis not present

## 2023-09-27 DIAGNOSIS — F411 Generalized anxiety disorder: Secondary | ICD-10-CM | POA: Diagnosis not present

## 2023-09-27 DIAGNOSIS — F330 Major depressive disorder, recurrent, mild: Secondary | ICD-10-CM | POA: Diagnosis not present

## 2023-10-06 LAB — COLOGUARD

## 2023-10-11 ENCOUNTER — Encounter (HOSPITAL_COMMUNITY): Payer: Self-pay | Admitting: Emergency Medicine

## 2023-10-11 ENCOUNTER — Emergency Department (HOSPITAL_COMMUNITY)
Admission: EM | Admit: 2023-10-11 | Discharge: 2023-10-12 | Disposition: A | Discharge status: Home / Self Care | Payer: Medicaid Other | Attending: Emergency Medicine | Admitting: Emergency Medicine

## 2023-10-11 ENCOUNTER — Emergency Department (HOSPITAL_COMMUNITY): Payer: Medicaid Other

## 2023-10-11 ENCOUNTER — Other Ambulatory Visit: Payer: Self-pay

## 2023-10-11 DIAGNOSIS — B349 Viral infection, unspecified: Secondary | ICD-10-CM | POA: Insufficient documentation

## 2023-10-11 DIAGNOSIS — Z20822 Contact with and (suspected) exposure to covid-19: Secondary | ICD-10-CM | POA: Insufficient documentation

## 2023-10-11 DIAGNOSIS — R509 Fever, unspecified: Secondary | ICD-10-CM | POA: Diagnosis present

## 2023-10-11 LAB — RESP PANEL BY RT-PCR (RSV, FLU A&B, COVID)  RVPGX2
Influenza A by PCR: NEGATIVE
Influenza B by PCR: NEGATIVE
Resp Syncytial Virus by PCR: NEGATIVE
SARS Coronavirus 2 by RT PCR: NEGATIVE

## 2023-10-11 LAB — COMPREHENSIVE METABOLIC PANEL
ALT: 10 U/L (ref 0–44)
AST: 14 U/L — ABNORMAL LOW (ref 15–41)
Albumin: 3.5 g/dL (ref 3.5–5.0)
Alkaline Phosphatase: 97 U/L (ref 38–126)
Anion gap: 9 (ref 5–15)
BUN: 15 mg/dL (ref 6–20)
CO2: 24 mmol/L (ref 22–32)
Calcium: 8.4 mg/dL — ABNORMAL LOW (ref 8.9–10.3)
Chloride: 100 mmol/L (ref 98–111)
Creatinine, Ser: 1.06 mg/dL (ref 0.61–1.24)
GFR, Estimated: >60 mL/min (ref >60)
Glucose, Bld: 103 mg/dL — ABNORMAL HIGH (ref 70–99)
Potassium: 3.6 mmol/L (ref 3.5–5.1)
Sodium: 133 mmol/L — ABNORMAL LOW (ref 135–145)
Total Bilirubin: 0.6 mg/dL (ref 0.0–1.2)
Total Protein: 7 g/dL (ref 6.5–8.1)

## 2023-10-11 LAB — CBC WITH DIFFERENTIAL/PLATELET
Abs Immature Granulocytes: 0.04 10*3/uL (ref 0.00–0.07)
Basophils Absolute: 0 10*3/uL (ref 0.0–0.1)
Basophils Relative: 0 %
Eosinophils Absolute: 0 10*3/uL (ref 0.0–0.5)
Eosinophils Relative: 0 %
HCT: 37.1 % — ABNORMAL LOW (ref 39.0–52.0)
Hemoglobin: 12.1 g/dL — ABNORMAL LOW (ref 13.0–17.0)
Immature Granulocytes: 0 %
Lymphocytes Relative: 6 %
Lymphs Abs: 0.6 10*3/uL — ABNORMAL LOW (ref 0.7–4.0)
MCH: 26.4 pg (ref 26.0–34.0)
MCHC: 32.6 g/dL (ref 30.0–36.0)
MCV: 80.8 fL (ref 80.0–100.0)
Monocytes Absolute: 0.4 10*3/uL (ref 0.1–1.0)
Monocytes Relative: 4 %
Neutro Abs: 8.9 10*3/uL — ABNORMAL HIGH (ref 1.7–7.7)
Neutrophils Relative %: 90 %
Platelets: 291 10*3/uL (ref 150–400)
RBC: 4.59 MIL/uL (ref 4.22–5.81)
RDW: 17.2 % — ABNORMAL HIGH (ref 11.5–15.5)
WBC: 10 10*3/uL (ref 4.0–10.5)
nRBC: 0 % (ref 0.0–0.2)

## 2023-10-11 LAB — LACTIC ACID, PLASMA
Lactic Acid, Venous: 1.6 mmol/L (ref 0.5–1.9)
Lactic Acid, Venous: 1.4 mmol/L (ref 0.5–1.9)

## 2023-10-11 MED ORDER — SODIUM CHLORIDE 0.9 % IV BOLUS
500.0000 mL | Freq: Once | INTRAVENOUS | Status: AC
  Administered 2023-10-11: 500 mL via INTRAVENOUS

## 2023-10-11 MED ORDER — IBUPROFEN 800 MG PO TABS
800.0000 mg | ORAL_TABLET | Freq: Once | ORAL | Status: AC
  Administered 2023-10-11: 800 mg via ORAL
  Filled 2023-10-11: qty 1

## 2023-10-11 NOTE — ED Notes (Signed)
 Pt assisted with a urinal to provide a sample.

## 2023-10-11 NOTE — ED Notes (Signed)
 Patient transported to X-ray

## 2023-10-11 NOTE — ED Provider Notes (Signed)
 [name] EMERGENCY DEPARTMENT AT [facility] Provider Note   CSN: [id number] Arrival date & time: 10/11/23  2008     History  Chief Complaint  Patient presents with   Fever    Vincent Anthony is a 53 y.o. male, hx of MS, who presents to the ED 2/2 to fever of unknown origin. Denies any SOB, chest pain, N/V/D. Reports at the facility he was doing PT and they noted he was warm w/fever of 103.71F. Denies dysuria, abdominal pain. Multiple people at facility are sick. Reports increased fatigue today and some nasal congestion.    Home Medications Prior to Admission medications   Medication Sig Start Date End Date Taking? Authorizing Provider  amitriptyline  (ELAVIL ) 10 MG tablet Take 1 tablet (10 mg total) by mouth at bedtime. Hold until linezolid  course has finished Patient taking differently: Take 10 mg by mouth at bedtime. 01/06/21   Pokhrel, Laxman, MD  artificial tears (LACRILUBE) OINT ophthalmic ointment Place into both eyes every 4 (four) hours as needed for dry eyes. 01/06/21   Pokhrel, Laxman, MD  baclofen  (LIORESAL ) 10 MG tablet Take 1 tablet by mouth 3 (three) times daily. 11/09/14   [provider]  bisacodyl  5 MG EC tablet As directed 09/10/22   Pearlean Manus, MD  cetirizine (ZYRTEC) 10 MG tablet Take 10 mg by mouth daily.    [provider]  Cholecalciferol  (VITAMIN D3) 50 MCG (2000 UT) TABS Take 2,000 Units by mouth daily.    [provider]  clonazePAM  (KLONOPIN ) 1 MG tablet Take 1 mg by mouth 2 (two) times daily.    [provider]  Emollient (EUCERIN) lotion Apply 1 Application topically every 12 (twelve) hours as needed for dry skin.    [provider]  famotidine  (PEPCID ) 20 MG tablet Take 20 mg by mouth 2 (two) times daily.    [provider]  fluticasone  (FLONASE ) 50 MCG/ACT nasal spray Place 1 spray into both nostrils daily.    [provider]  hyoscyamine  (LEVBID ) 0.375 MG 12 hr tablet TAKE  (1) TABLET BYMOUTH EVERY TWELVE HOURS. 09/16/20   Shirlean Therisa ORN, NP  levETIRAcetam  (KEPPRA ) 250 MG tablet Take 250 mg by mouth 2 (two) times daily. 11/09/14   [provider]  lubiprostone  (AMITIZA ) 8 MCG capsule Take by mouth. 05/07/23   [provider]  metFORMIN (GLUCOPHAGE) 500 MG tablet Take 500 mg by mouth 2 (two) times daily.    [provider]  modafinil  (PROVIGIL ) 200 MG tablet Take 200 mg by mouth daily.    [provider]  Multiple Vitamin (MULTIVITAMIN WITH MINERALS) TABS tablet Take 1 tablet by mouth daily.    [provider]  ondansetron  (ZOFRAN ) 4 MG tablet Take 4 mg by mouth every 8 (eight) hours as needed for nausea or vomiting.    [provider]  pantoprazole  (PROTONIX ) 40 MG tablet Take 1 tablet (40 mg total) by mouth at bedtime. 01/06/21   Pokhrel, Laxman, MD  polyethylene glycol (MIRALAX  / GLYCOLAX ) 17 g packet Take 17 g by mouth 2 (two) times daily. Patient taking differently: Take 8.5 g by mouth See admin instructions. Monday,Wednesday Friday and Sunday 09/10/22   Pearlean Manus, MD  pravastatin  (PRAVACHOL ) 20 MG tablet Take 20 mg by mouth daily.    [provider]  pregabalin  (LYRICA ) 150 MG capsule Take 150 mg by mouth at bedtime. 02/06/22   [provider]  pregabalin  (LYRICA ) 75 MG capsule Take 75 mg by mouth daily.  [provider]  senna (SENOKOT) 8.6 MG tablet Take 2 tablets (17.2 mg total) by mouth 2 (two) times daily. Hold for loose stools >2/day 09/10/22   Pearlean Manus, MD      Allergies    Celebrex [celecoxib], Clindamycin, Vioxx [rofecoxib], and Zanaflex [tizanidine hcl]    Review of Systems   Review of Systems  Constitutional:  Positive for fever.  Gastrointestinal:  Negative for abdominal pain.    Physical Exam Updated Vital Signs BP 120/68   Pulse 100   Temp 100 F (37.8 C) (Oral)   Resp 16   Ht 6' 3 (1.905 m)   Wt 109 kg   SpO2 97%   BMI 30.04 kg/m   Physical Exam Vitals and nursing note reviewed.  Constitutional:      General: He is not in acute distress.    Appearance: He is well-developed.  HENT:     Head: Normocephalic and atraumatic.  Eyes:     Conjunctiva/sclera: Conjunctivae normal.  Cardiovascular:     Rate and Rhythm: Normal rate and regular rhythm.     Heart sounds: No murmur heard. Pulmonary:     Effort: Pulmonary effort is normal. No respiratory distress.     Breath sounds: Normal breath sounds.  Abdominal:     Palpations: Abdomen is soft.     Tenderness: There is no abdominal tenderness.  Musculoskeletal:        General: No swelling.     Cervical back: Neck supple.  Skin:    General: Skin is warm and dry.     Capillary Refill: Capillary refill takes less than 2 seconds.  Neurological:     Mental Status: He is alert.  Psychiatric:        Mood and Affect: Mood normal.     ED Results / Procedures / Treatments   Labs (all labs ordered are listed, but only abnormal results are displayed) Labs Reviewed  CBC WITH DIFFERENTIAL/PLATELET - Abnormal; Notable for the following components:      Result Value   Hemoglobin 12.1 (*)    HCT 37.1 (*)    RDW 17.2 (*)    Neutro Abs 8.9 (*)    Lymphs Abs 0.6 (*)    All other components within normal limits  CULTURE, BLOOD (ROUTINE X 2)  CULTURE, BLOOD (ROUTINE X 2)  RESP PANEL BY RT-PCR (RSV, FLU A&B, COVID)  [id number]  LACTIC ACID, PLASMA  URINALYSIS, ROUTINE W REFLEX MICROSCOPIC  LACTIC ACID, PLASMA  COMPREHENSIVE METABOLIC PANEL    EKG None  Radiology DG Chest 2 View Result Date: 10/11/2023 CLINICAL DATA:  Fever of unknown origin EXAM: CHEST - 2 VIEW COMPARISON:  08/16/2023 FINDINGS: Vincent Anthony bilateral effusions. Discoid atelectasis left base. Normal cardiac size. No pneumothorax IMPRESSION: Vincent Anthony bilateral effusions with discoid atelectasis at the left base. Electronically Signed   By: Vincent Anthony M.D.   On: 10/11/2023 21:50    Procedures Procedures     Medications Ordered in ED Medications  sodium chloride  0.9 % bolus 500 mL (has no administration in time range)  ibuprofen  (ADVIL ) tablet 800 mg (has no administration in time range)    ED Course/ Medical Decision Making/ A&P                                 Medical Decision Making Patient is a 53 year old male, here for fever of unknown origin today.  He states he last got Tylenol  at  7 PM today.  He states that he feels a little bit more fatigued today, and Everone at the facility sick.  He denies any kind of illness other than some nasal congestion.  Not any shortness of breath, urinary symptoms, or abdominal pain.  Is overall well-appearing.  Will give him some fluids, and get a chest x-ray, blood work, blood cultures just in case, given his fever, as well as COVID/flu test him.  Many people at his facility, have nausea, vomiting and diarrhea.  Amount and/or Complexity of Data Reviewed Labs: ordered.    Details: No leukocytosis Radiology: ordered.    Details: Chest x-ray shows Rin Gorton and effusions, no evidence of pneumonia Discussion of management or test interpretation with external provider(s): Patient pending CMP, COVID/flu, as well as urinalysis.  Signed out to [place], Vincent Anthony, to follow-up on results.  Risk Prescription drug management.   Final Clinical Impression(s) / ED Diagnoses Final diagnoses:  None    Rx / DC Orders ED Discharge Orders     None         Philippa, Lyle CROME, PA 10/11/23 2156    Yolande Lamar BROCKS, MD 10/13/23 1535

## 2023-10-11 NOTE — ED Notes (Signed)
 Per provider - no need for repeat lactic level.

## 2023-10-11 NOTE — ED Triage Notes (Signed)
 Per facility pt developed fever today and pt has no complaints. Pt tested negative for covid.

## 2023-10-11 NOTE — ED Notes (Signed)
Urinal at bedside. Pt aware sample is needed.

## 2023-10-11 NOTE — ED Notes (Signed)
 Pt provided some water to provide additional urine sample.

## 2023-10-12 DIAGNOSIS — B349 Viral infection, unspecified: Secondary | ICD-10-CM | POA: Diagnosis not present

## 2023-10-12 LAB — URINALYSIS, ROUTINE W REFLEX MICROSCOPIC
Bilirubin Urine: NEGATIVE
Glucose, UA: NEGATIVE mg/dL
Hgb urine dipstick: NEGATIVE
Ketones, ur: NEGATIVE mg/dL
Leukocytes,Ua: NEGATIVE
Nitrite: NEGATIVE
Protein, ur: NEGATIVE mg/dL
Specific Gravity, Urine: 1.019 (ref 1.005–1.030)
pH: 5 (ref 5.0–8.0)

## 2023-10-12 NOTE — ED Notes (Signed)
 Patient is on the transportation list back to [facility]

## 2023-10-12 NOTE — ED Provider Notes (Signed)
  Provider Note MRN:  [id number]  Arrival date & time: 10/12/23    ED Course and Medical Decision Making  Assumed care of patient at sign-out or upon transfer.  Fever of unknown origin, history of MS.  Favoring viral process, awaiting urinalysis, anticipating discharge.  Procedures  Final Clinical Impressions(s) / ED Diagnoses     ICD-10-CM   1. Febrile illness  R50.9     2. Viral illness  B34.9       ED Discharge Orders     None         Discharge Instructions      Treat any fever with Tylenol  and/or ibuprofen . Drink lots of fluids to avoid dehydration.   Follow up with your doctor as needed for recheck later this week if fever persists.     Ozell HERO. Theadore, MD [facility] Emergency Medicine [facility] [email or web address]    Theadore Ozell HERO, MD 10/12/23 0100

## 2023-10-12 NOTE — Discharge Instructions (Signed)
 Treat any fever with Tylenol and/or ibuprofen. Drink lots of fluids to avoid dehydration.   Follow up with your doctor as needed for recheck later this week if fever persists.

## 2023-10-12 NOTE — ED Notes (Signed)
 Pt leaving with [facility] rescue. Report was called by previous nurse

## 2023-10-13 LAB — BLOOD CULTURE ID PANEL (REFLEXED) - BCID2

## 2023-10-13 NOTE — ED Notes (Signed)
 10/13/23 @0805hrs  . Dr.Zackowski aware of [facility] lab calling for positive gram, positive cocci in 1 aerobic bottle.

## 2023-10-13 NOTE — ED Notes (Signed)
 Charge nurse called [facility] to give nurse update and ask about the pt. The pt nurse was unable to answer questions so charge nurse spoke to the pt NP on site. NP states pt has been fine and no issues noted. NP was going to look at pt mychart for further information. 10/13/2023 @1035hrs 

## 2023-10-13 NOTE — ED Notes (Signed)
 10/13/2023 @0820hrs . Attempted to call [facility] to check on pt and inform of abnormal labs. Secretary answered the phone and transferred charge nurse to nurse. Charge nurse waited 10 mins on hold. Nurse then called facility back with no answer at all.

## 2023-10-13 NOTE — ED Provider Notes (Signed)
 Patient's blood culture aerobic bottle growing gram-positive cocci which could be contamination.  Patient currently at [facility] nursing facility.  Working to contact them if patient has not improved or is worse or is still having fevers we will going to recommend that he come in for reeval.  Patient's lactic acid was normal when he was seen.  At 1.4.  Respiratory panel was also negative.  White blood cell count was not significantly elevated at 10,000.  These labs are reassuring.  Patient was seen January 13.   Eldra Word, MD 10/13/23 [phone]

## 2023-10-13 NOTE — ED Notes (Signed)
 [facility]rol[facility] called with positive blood culture. Aerobic bottle gram positive cocci with staph bcs, Dr Drury Geralds aware.

## 2023-10-13 NOTE — ED Notes (Signed)
 Charge nurse attempted to call [facility] again to update nurse about pt results. Charge nurse waited 7 mins and unable to give results to a nurse. 10/13/2023@0930 

## 2023-10-15 LAB — CULTURE, BLOOD (ROUTINE X 2): Special Requests: ADEQUATE

## 2023-10-16 ENCOUNTER — Telehealth (HOSPITAL_BASED_OUTPATIENT_CLINIC_OR_DEPARTMENT_OTHER): Payer: Self-pay | Admitting: *Deleted

## 2023-10-16 LAB — CULTURE, BLOOD (ROUTINE X 2)
Culture: NO GROWTH
Special Requests: ADEQUATE

## 2023-10-16 NOTE — Telephone Encounter (Signed)
Post ED Visit - Positive Culture Follow-up  Culture report reviewed by antimicrobial stewardship pharmacist: [facility] []  [address] Pharm.D. []  Celedonio Miyamoto, Pharm.D., BCPS AQ-ID []  Garvin Fila, Pharm.D., BCPS []  Georgina Pillion, [address].D., BCPS []  [place], [place].D., BCPS, AAHIVP []  Estella Husk, Pharm.D., BCPS, AAHIVP []  Lysle Pearl, PharmD, BCPS []  Phillips Climes, PharmD, BCPS []  Agapito Games, PharmD, BCPS []  Verlan Friends, PharmD []  Mervyn Gay, PharmD, BCPS [x]  Delmar Landau, PharmD  [facility] []  Len Childs, PharmD []  Greer Pickerel, PharmD []  Adalberto Cole, PharmD []  Perlie Gold, Rph []  Lonell Face) Jean Rosenthal, PharmD []  Earl Many, PharmD []  Junita Push, PharmD []  Dorna Leitz, PharmD []  Terrilee Files, PharmD []  Lynann Beaver, PharmD []  Keturah Barre, PharmD []  Loralee Pacas, PharmD []  Bernadene Person, PharmD   Positive blood culture Per Chart, Dorina Hoyer NP made aware of findings and no further patient follow-up is required at this time.  Patsey Berthold 10/16/2023, 1:53 PM

## 2023-10-24 LAB — COLOGUARD

## 2023-10-28 ENCOUNTER — Ambulatory Visit: Payer: Medicare Other | Attending: Internal Medicine | Admitting: Internal Medicine

## 2023-10-28 VITALS — BP 124/74 | HR 83 | Ht 75.0 in

## 2023-10-28 DIAGNOSIS — R9431 Abnormal electrocardiogram [ECG] [EKG]: Secondary | ICD-10-CM | POA: Diagnosis present

## 2023-10-28 DIAGNOSIS — Z136 Encounter for screening for cardiovascular disorders: Secondary | ICD-10-CM | POA: Insufficient documentation

## 2023-10-28 NOTE — Patient Instructions (Signed)
Medication Instructions:  Your physician recommends that you continue on your current medications as directed. Please refer to the Current Medication list given to you today.   Labwork: None  Testing/Procedures: None  Follow-Up: Your physician recommends that you schedule a follow-up appointment in: As Needed  Any Other Special Instructions Will Be Listed Below (If Applicable).  Thank you for choosing [facility]!     If you need a refill on your cardiac medications before your next appointment, please call your pharmacy.

## 2023-10-28 NOTE — Progress Notes (Signed)
Cardiology Office Note  Date: 10/28/2023   ID: Ahmere, Hemenway 1971-02-12, MRN [id number]  PCP:  Kerri Perches, MD  Cardiologist:  Marjo Bicker, MD Electrophysiologist:  None   History of Present Illness: Vincent Anthony is a 53 y.o. male  He had COVID-19 infection in Nov 2024.  Lives in facility.  He was referred to cardiology due to abnormal EKG.  I reviewed all the EKGs in 2024 that showed no evidence of anterior infarct.  He had minimal EKG changes for inferior infarct but this is nonspecific.  He does not have any symptoms of angina, DOE, orthopnea, PND, dizziness, palpitations or leg swelling.  He walks in the facility and has no symptoms.  Past Medical History:  Diagnosis Date   Arthritis    Depression    Diabetes mellitus without complication (HCC)    Elevated LFTs    Elevated liver enzymes    GERD (gastroesophageal reflux disease)    Headache    MS (multiple sclerosis) (HCC)    Neurogenic bladder    Neuromuscular disorder (HCC)    Tremor     Past Surgical History:  Procedure Laterality Date   COLONOSCOPY  2014   COLONOSCOPY WITH PROPOFOL N/A 05/01/2022   Procedure: COLONOSCOPY WITH PROPOFOL;  Surgeon: Corbin Ade, MD;  Location: [facility];  Service: Endoscopy;  Laterality: N/A;  9:15am   FLEXIBLE SIGMOIDOSCOPY N/A 08/11/2023   Procedure: FLEXIBLE SIGMOIDOSCOPY;  Surgeon: Corbin Ade, MD;  Location: [facility];  Service: Endoscopy;  Laterality: N/A;  11:00am, asa 3 (at [facility])   HERNIA REPAIR     KNEE SURGERY     TESTICLE REMOVAL      Current Outpatient Medications  Medication Sig Dispense Refill   amitriptyline (ELAVIL) 10 MG tablet Take 1 tablet (10 mg total) by mouth at bedtime. Hold until linezolid course has finished (Patient taking differently: Take 10 mg by mouth at bedtime.)     artificial tears (LACRILUBE) OINT ophthalmic ointment Place into both eyes every 4 (four) hours as needed for dry eyes.      baclofen (LIORESAL) 10 MG tablet Take 1 tablet by mouth 3 (three) times daily.     bisacodyl 5 MG EC tablet As directed 30 tablet 3   cetirizine (ZYRTEC) 10 MG tablet Take 10 mg by mouth daily.     Cholecalciferol (VITAMIN D3) 50 MCG (2000 UT) TABS Take 2,000 Units by mouth daily.     clonazePAM (KLONOPIN) 1 MG tablet Take 1 mg by mouth 2 (two) times daily.     Emollient (EUCERIN) lotion Apply 1 Application topically every 12 (twelve) hours as needed for dry skin.     famotidine (PEPCID) 20 MG tablet Take 20 mg by mouth 2 (two) times daily.     fluticasone (FLONASE) 50 MCG/ACT nasal spray Place 1 spray into both nostrils daily.     hyoscyamine (LEVBID) 0.375 MG 12 hr tablet TAKE (1) TABLET BYMOUTH EVERY TWELVE HOURS. 60 tablet 11   levETIRAcetam (KEPPRA) 250 MG tablet Take 250 mg by mouth 2 (two) times daily.     lubiprostone (AMITIZA) 8 MCG capsule Take by mouth.     metFORMIN (GLUCOPHAGE) 500 MG tablet Take 500 mg by mouth 2 (two) times daily.     modafinil (PROVIGIL) 200 MG tablet Take 200 mg by mouth daily.     Multiple Vitamin (MULTIVITAMIN WITH MINERALS) TABS tablet Take 1 tablet by mouth daily.  ondansetron (ZOFRAN) 4 MG tablet Take 4 mg by mouth every 8 (eight) hours as needed for nausea or vomiting.     pantoprazole (PROTONIX) 40 MG tablet Take 1 tablet (40 mg total) by mouth at bedtime. 30 tablet    polyethylene glycol (MIRALAX / GLYCOLAX) 17 g packet Take 17 g by mouth 2 (two) times daily. (Patient taking differently: Take 8.5 g by mouth See admin instructions. Monday,Wednesday Friday and Sunday) 60 each 4   pravastatin (PRAVACHOL) 20 MG tablet Take 20 mg by mouth daily.     pregabalin (LYRICA) 150 MG capsule Take 150 mg by mouth at bedtime.     pregabalin (LYRICA) 75 MG capsule Take 75 mg by mouth daily.     senna (SENOKOT) 8.6 MG tablet Take 2 tablets (17.2 mg total) by mouth 2 (two) times daily. Hold for loose stools >2/day 120 tablet 2   No current facility-administered  medications for this visit.   Allergies:  Celebrex [celecoxib], Clindamycin, Vioxx [rofecoxib], and Zanaflex [tizanidine hcl]   Social History: The patient  reports that he has never smoked. He has never used smokeless tobacco. He reports that he does not drink alcohol and does not use drugs.   Family History: The patient's family history includes Diabetes in his father; Heart attack in his mother; Hypertension in his father.   ROS:  Please see the history of present illness. Otherwise, complete review of systems is positive for none  All other systems are reviewed and negative.   Physical Exam: VS:  BP 124/74   Pulse 83   Ht 6\' 3"  (1.905 m)   SpO2 95%   BMI 30.04 kg/m , BMI Body mass index is 30.04 kg/m.  Wt Readings from Last 3 Encounters:  10/11/23 240 lb 4.8 oz (109 kg)  08/16/23 241 lb (109.3 kg)  08/11/23 (P) 240 lb 15.4 oz (109.3 kg)    General: Patient appears comfortable at rest. HEENT: Conjunctiva and lids normal, oropharynx clear with moist mucosa. Neck: Supple, no elevated JVP or carotid bruits, no thyromegaly. Lungs: Clear to auscultation, nonlabored breathing at rest. Cardiac: Regular rate and rhythm, no S3 or significant systolic murmur, no pericardial rub. Abdomen: Soft, nontender, no hepatomegaly, bowel sounds present, no guarding or rebound. Extremities: No pitting edema, distal pulses 2+. Skin: Warm and dry. Musculoskeletal: No kyphosis. Neuropsychiatric: Alert and oriented x3, affect grossly appropriate.  Recent Labwork: 10/11/2023: ALT 10; AST 14; BUN 15; Creatinine, Ser 1.06; Hemoglobin 12.1; Platelets 291; Potassium 3.6; Sodium 133     Component Value Date/Time   TRIG 163 (H) 11/17/2020 1341     Assessment and Plan:  Abnormal EKG: Patient had COVID-19 infection in November 2024.  EKG at that time showed anterior infarct prompting cardiology referral. EKG performed today showed no evidence of anterior infarct but known inferior infarct otherwise  normal sinus rhythm.  He denies of any symptoms of angina, DOE, orthopnea yeah or PND.  No further cardiac workup is recommended at this time.       Medication Adjustments/Labs and Tests Ordered: Current medicines are reviewed at length with the patient today.  Concerns regarding medicines are outlined above.    Disposition:  Follow up prn  Signed Juliet Vasbinder Verne Spurr, MD, 10/28/2023 12:33 PM    [facility] [address] [place], [place], [place] [address]

## 2023-12-01 LAB — COLOGUARD: COLOGUARD: NEGATIVE

## 2024-01-25 ENCOUNTER — Other Ambulatory Visit: Payer: Self-pay

## 2024-01-25 ENCOUNTER — Emergency Department (HOSPITAL_COMMUNITY)
Admission: EM | Admit: 2024-01-25 | Discharge: 2024-01-25 | Disposition: A | Discharge status: Skilled Nursing Facility | Attending: Emergency Medicine | Admitting: Emergency Medicine

## 2024-01-25 ENCOUNTER — Encounter (HOSPITAL_COMMUNITY): Payer: Self-pay | Admitting: *Deleted

## 2024-01-25 DIAGNOSIS — R21 Rash and other nonspecific skin eruption: Secondary | ICD-10-CM

## 2024-01-25 DIAGNOSIS — B372 Candidiasis of skin and nail: Secondary | ICD-10-CM | POA: Insufficient documentation

## 2024-01-25 DIAGNOSIS — E119 Type 2 diabetes mellitus without complications: Secondary | ICD-10-CM | POA: Insufficient documentation

## 2024-01-25 MED ORDER — CLOTRIMAZOLE 1 % EX CREA: TOPICAL_CREAM | CUTANEOUS | 0 refills | Status: DC

## 2024-01-25 MED ORDER — HYDROCORTISONE 1 % EX CREA: TOPICAL_CREAM | CUTANEOUS | 0 refills | Status: DC

## 2024-01-25 NOTE — ED Provider Notes (Signed)
 [place] EMERGENCY DEPARTMENT AT [facility] Provider Note   CSN: [id number] Arrival date & time: 01/25/24  1154     History  Chief Complaint  Patient presents with   Rash    Vincent Anthony is a 53 y.o. male past medical history of diabetes MS, neuromuscular disorder, headache, and GERD presents today for rash to his ears and groin.  Per the patient the rash on his ears has blisters and are draining clear fluid.  Patient states that the rash to his groin is pruritic.  Patient denies fever, chills, nausea, vomiting, cough, congestion, hearing changes, ear pain, any other complaints at this time.    Rash      Home Medications Prior to Admission medications   Medication Sig Start Date End Date Taking? Authorizing Provider  clotrimazole (LOTRIMIN) 1 % cream Apply to affected area (groin area) 2 times daily for two weeks 01/25/24  Yes Egypt Marchiano N, PA-C  hydrocortisone  cream 1 % Apply to affected area (ears) 2-4 times daily 01/25/24  Yes Tanner Vigna N, PA-C  amitriptyline  (ELAVIL ) 10 MG tablet Take 1 tablet (10 mg total) by mouth at bedtime. Hold until linezolid  course has finished Patient taking differently: Take 10 mg by mouth at bedtime. 01/06/21   Pokhrel, Laxman, MD  artificial tears (LACRILUBE) OINT ophthalmic ointment Place into both eyes every 4 (four) hours as needed for dry eyes. 01/06/21   Pokhrel, Laxman, MD  baclofen  (LIORESAL ) 10 MG tablet Take 1 tablet by mouth 3 (three) times daily. 11/09/14   [provider]  bisacodyl  5 MG EC tablet As directed 09/10/22   Colin Dawley, MD  cetirizine (ZYRTEC) 10 MG tablet Take 10 mg by mouth daily.    [provider]  Cholecalciferol  (VITAMIN D3) 50 MCG (2000 UT) TABS Take 2,000 Units by mouth daily.    [provider]  clonazePAM  (KLONOPIN ) 1 MG tablet Take 1 mg by mouth 2 (two) times daily.    [provider]  Emollient (EUCERIN) lotion Apply 1 Application topically every 12  (twelve) hours as needed for dry skin.    [provider]  famotidine (PEPCID) 20 MG tablet Take 20 mg by mouth 2 (two) times daily.    [provider]  fluticasone (FLONASE) 50 MCG/ACT nasal spray Place 1 spray into both nostrils daily.    [provider]  hyoscyamine  (LEVBID) 0.375 MG 12 hr tablet TAKE (1) TABLET BYMOUTH EVERY TWELVE HOURS. 09/16/20   Delman Ferns, NP  levETIRAcetam  (KEPPRA ) 250 MG tablet Take 250 mg by mouth 2 (two) times daily. 11/09/14   [provider]  lubiprostone  (AMITIZA ) 8 MCG capsule Take by mouth. 05/07/23   [provider]  metFORMIN (GLUCOPHAGE) 500 MG tablet Take 500 mg by mouth 2 (two) times daily.    [provider]  modafinil (PROVIGIL) 200 MG tablet Take 200 mg by mouth daily.    [provider]  Multiple Vitamin (MULTIVITAMIN WITH MINERALS) TABS tablet Take 1 tablet by mouth daily.    [provider]  ondansetron  (ZOFRAN ) 4 MG tablet Take 4 mg by mouth every 8 (eight) hours as needed for nausea or vomiting.    [provider]  pantoprazole  (PROTONIX ) 40 MG tablet Take 1 tablet (40 mg total) by mouth at bedtime. 01/06/21   Pokhrel, Laxman, MD  polyethylene glycol (MIRALAX  / GLYCOLAX ) 17 g packet Take 17 g by mouth 2 (two) times daily. Patient taking differently: Take 8.5 g by mouth  See admin instructions. Monday,Wednesday Friday and Sunday 09/10/22   Colin Dawley, MD  pravastatin  (PRAVACHOL ) 20 MG tablet Take 20 mg by mouth daily.    [provider]  pregabalin  (LYRICA ) 150 MG capsule Take 150 mg by mouth at bedtime. 02/06/22   [provider]  pregabalin  (LYRICA ) 75 MG capsule Take 75 mg by mouth daily.    [provider]  senna (SENOKOT) 8.6 MG tablet Take 2 tablets (17.2 mg total) by mouth 2 (two) times daily. Hold for loose stools >2/day 09/10/22   Colin Dawley, MD      Allergies    Celebrex [celecoxib], Clindamycin, Vioxx [rofecoxib], and  Zanaflex [tizanidine hcl]    Review of Systems   Review of Systems  Skin:  Positive for rash.    Physical Exam Updated Vital Signs BP 125/84 (BP Location: Left Arm)   Pulse 78   Temp 98 F (36.7 C) (Oral)   Resp 18   SpO2 99%  Physical Exam Vitals and nursing note reviewed.  Constitutional:      General: He is not in acute distress.    Appearance: Normal appearance. He is well-developed. He is not ill-appearing, toxic-appearing or diaphoretic.  HENT:     Head: Normocephalic and atraumatic.     Ears:     Comments: Tense, shiny, mildly weeping rash on bilateral external ears with no tenderness to palpation or erythema noted    Nose: Nose normal. No congestion.     Mouth/Throat:     Mouth: Mucous membranes are moist.  Eyes:     Conjunctiva/sclera: Conjunctivae normal.  Cardiovascular:     Rate and Rhythm: Normal rate and regular rhythm.     Pulses: Normal pulses.     Heart sounds: No murmur heard. Pulmonary:     Effort: Pulmonary effort is normal. No respiratory distress.     Breath sounds: Normal breath sounds.  Abdominal:     Palpations: Abdomen is soft.     Tenderness: There is no abdominal tenderness.  Musculoskeletal:        General: No swelling.     Cervical back: Neck supple.  Skin:    General: Skin is warm and dry.     Capillary Refill: Capillary refill takes less than 2 seconds.     Findings: Rash present.     Comments: Thick white, yeastlike rash noted to patient's bilateral groin folds and panniculus  Neurological:     Mental Status: He is alert. Mental status is at baseline.  Psychiatric:        Mood and Affect: Mood normal.     ED Results / Procedures / Treatments   Labs (all labs ordered are listed, but only abnormal results are displayed) Labs Reviewed - No data to display  EKG None  Radiology No results found.  Procedures Procedures    Medications Ordered in ED Medications - No data to display  ED Course/ Medical Decision Making/  A&P                                 Medical Decision Making  This patient presents to the ED for concern of rash on bilateral ears and rash in groin folds differential diagnosis includes eczema, yeast infection, Ramsay Hunt syndrome, shingles, HSV   Problem List / ED Course:  Consider for admission or further workup however patient's vital signs and physical exam are reassuring.  Patient prescribed clotrimazole ointment for  what is likely yeast infection of his groin and panniculus.  Patient also prescribed hydrocortisone  cream for his ear rash.  Patient advised to follow-up with his primary care if his symptoms persist for further evaluation workup.        Final Clinical Impression(s) / ED Diagnoses Final diagnoses:  Rash  Yeast infection of the skin    Rx / DC Orders ED Discharge Orders          Ordered    clotrimazole (LOTRIMIN) 1 % cream        01/25/24 1400    hydrocortisone  cream 1 %        01/25/24 1400              Carie Charity, PA-C 01/25/24 1400    Tonya Fredrickson, MD 01/25/24 [phone]

## 2024-01-25 NOTE — Discharge Instructions (Signed)
 Today you were seen for a yeast infection of your groin and rash of bilateral ears.  Please use your antifungal ointment on your groin and your hydrocortisone  cream for your ears.  Please return to your primary care if your symptoms persist for further evaluation and workup.  Thank you for letting us  treat you today. After performing physical exam, I feel you are safe to go home. Please follow up with your PCP in the next several days and provide them with your records from this visit. Return to the Emergency Room if pain becomes severe or symptoms worsen.

## 2024-01-25 NOTE — ED Triage Notes (Signed)
 Pt BIB [facility] from [facility] for c/o rash to ears and groin area    The blisters to the ears are draining clear fluid  Pt states the rash to his groin is "itchy"

## 2024-01-25 NOTE — ED Notes (Signed)
 Pts wife called this RN for update. Pt verbally consented to provide wife with medications and directions on how to use the prescribed medications at time of discharge. Pt awaiting [facility] transport back to [facility].

## 2024-01-25 NOTE — ED Notes (Signed)
 ED Provider at bedside.

## 2024-02-12 ENCOUNTER — Encounter (HOSPITAL_COMMUNITY): Payer: Self-pay

## 2024-02-12 ENCOUNTER — Other Ambulatory Visit: Payer: Self-pay

## 2024-02-12 ENCOUNTER — Emergency Department (HOSPITAL_COMMUNITY)

## 2024-02-12 ENCOUNTER — Inpatient Hospital Stay (HOSPITAL_COMMUNITY)
Admission: EM | Admit: 2024-02-12 | Discharge: 2024-02-15 | DRG: 872 | Disposition: A | Discharge status: Skilled Nursing Facility | Source: Skilled Nursing Facility | Attending: Family Medicine | Admitting: Family Medicine

## 2024-02-12 DIAGNOSIS — K592 Neurogenic bowel, not elsewhere classified: Secondary | ICD-10-CM | POA: Diagnosis not present

## 2024-02-12 DIAGNOSIS — L03116 Cellulitis of left lower limb: Secondary | ICD-10-CM | POA: Insufficient documentation

## 2024-02-12 DIAGNOSIS — Z833 Family history of diabetes mellitus: Secondary | ICD-10-CM

## 2024-02-12 DIAGNOSIS — Z888 Allergy status to other drugs, medicaments and biological substances status: Secondary | ICD-10-CM | POA: Diagnosis not present

## 2024-02-12 DIAGNOSIS — E119 Type 2 diabetes mellitus without complications: Secondary | ICD-10-CM

## 2024-02-12 DIAGNOSIS — K5900 Constipation, unspecified: Secondary | ICD-10-CM | POA: Diagnosis not present

## 2024-02-12 DIAGNOSIS — R569 Unspecified convulsions: Secondary | ICD-10-CM

## 2024-02-12 DIAGNOSIS — Z79899 Other long term (current) drug therapy: Secondary | ICD-10-CM

## 2024-02-12 DIAGNOSIS — Z87440 Personal history of urinary (tract) infections: Secondary | ICD-10-CM | POA: Diagnosis not present

## 2024-02-12 DIAGNOSIS — Z1152 Encounter for screening for COVID-19: Secondary | ICD-10-CM | POA: Diagnosis not present

## 2024-02-12 DIAGNOSIS — F32A Depression, unspecified: Secondary | ICD-10-CM | POA: Diagnosis not present

## 2024-02-12 DIAGNOSIS — N319 Neuromuscular dysfunction of bladder, unspecified: Secondary | ICD-10-CM | POA: Diagnosis not present

## 2024-02-12 DIAGNOSIS — E876 Hypokalemia: Secondary | ICD-10-CM | POA: Diagnosis not present

## 2024-02-12 DIAGNOSIS — Z886 Allergy status to analgesic agent status: Secondary | ICD-10-CM | POA: Diagnosis not present

## 2024-02-12 DIAGNOSIS — Z8249 Family history of ischemic heart disease and other diseases of the circulatory system: Secondary | ICD-10-CM | POA: Diagnosis not present

## 2024-02-12 DIAGNOSIS — G40909 Epilepsy, unspecified, not intractable, without status epilepticus: Secondary | ICD-10-CM | POA: Diagnosis not present

## 2024-02-12 DIAGNOSIS — G709 Myoneural disorder, unspecified: Secondary | ICD-10-CM | POA: Diagnosis not present

## 2024-02-12 DIAGNOSIS — K219 Gastro-esophageal reflux disease without esophagitis: Secondary | ICD-10-CM | POA: Diagnosis not present

## 2024-02-12 DIAGNOSIS — R6510 Systemic inflammatory response syndrome (SIRS) of non-infectious origin without acute organ dysfunction: Secondary | ICD-10-CM | POA: Diagnosis present

## 2024-02-12 DIAGNOSIS — G35 Multiple sclerosis: Secondary | ICD-10-CM | POA: Diagnosis not present

## 2024-02-12 DIAGNOSIS — R509 Fever, unspecified: Secondary | ICD-10-CM | POA: Diagnosis present

## 2024-02-12 DIAGNOSIS — N179 Acute kidney failure, unspecified: Secondary | ICD-10-CM | POA: Diagnosis not present

## 2024-02-12 DIAGNOSIS — Z881 Allergy status to other antibiotic agents status: Secondary | ICD-10-CM

## 2024-02-12 DIAGNOSIS — A419 Sepsis, unspecified organism: Secondary | ICD-10-CM | POA: Diagnosis not present

## 2024-02-12 DIAGNOSIS — Z7984 Long term (current) use of oral hypoglycemic drugs: Secondary | ICD-10-CM | POA: Diagnosis not present

## 2024-02-12 LAB — CBC WITH DIFFERENTIAL/PLATELET
Abs Immature Granulocytes: 0.05 10*3/uL (ref 0.00–0.07)
Basophils Absolute: 0 10*3/uL (ref 0.0–0.1)
Basophils Relative: 0 %
Eosinophils Absolute: 0 10*3/uL (ref 0.0–0.5)
Eosinophils Relative: 0 %
HCT: 40.8 % (ref 39.0–52.0)
Hemoglobin: 13.1 g/dL (ref 13.0–17.0)
Immature Granulocytes: 0 %
Lymphocytes Relative: 5 %
Lymphs Abs: 0.6 10*3/uL — ABNORMAL LOW (ref 0.7–4.0)
MCH: 26.5 pg (ref 26.0–34.0)
MCHC: 32.1 g/dL (ref 30.0–36.0)
MCV: 82.6 fL (ref 80.0–100.0)
Monocytes Absolute: 0.5 10*3/uL (ref 0.1–1.0)
Monocytes Relative: 4 %
Neutro Abs: 11.3 10*3/uL — ABNORMAL HIGH (ref 1.7–7.7)
Neutrophils Relative %: 91 %
Platelets: 316 10*3/uL (ref 150–400)
RBC: 4.94 MIL/uL (ref 4.22–5.81)
RDW: 15.9 % — ABNORMAL HIGH (ref 11.5–15.5)
WBC: 12.5 10*3/uL — ABNORMAL HIGH (ref 4.0–10.5)
nRBC: 0 % (ref 0.0–0.2)

## 2024-02-12 LAB — URINALYSIS, W/ REFLEX TO CULTURE (INFECTION SUSPECTED)
Bacteria, UA: NONE SEEN
Bilirubin Urine: NEGATIVE
Glucose, UA: NEGATIVE mg/dL
Hgb urine dipstick: NEGATIVE
Ketones, ur: NEGATIVE mg/dL
Leukocytes,Ua: NEGATIVE
Nitrite: NEGATIVE
Protein, ur: NEGATIVE mg/dL
Specific Gravity, Urine: 1.012 (ref 1.005–1.030)
pH: 6 (ref 5.0–8.0)

## 2024-02-12 LAB — RESP PANEL BY RT-PCR (RSV, FLU A&B, COVID)  RVPGX2
Influenza A by PCR: NEGATIVE
Influenza B by PCR: NEGATIVE
Resp Syncytial Virus by PCR: NEGATIVE
SARS Coronavirus 2 by RT PCR: NEGATIVE

## 2024-02-12 LAB — COMPREHENSIVE METABOLIC PANEL WITH GFR
ALT: 13 U/L (ref 0–44)
AST: 17 U/L (ref 15–41)
Albumin: 4.1 g/dL (ref 3.5–5.0)
Alkaline Phosphatase: 134 U/L — ABNORMAL HIGH (ref 38–126)
Anion gap: 8 (ref 5–15)
BUN: 14 mg/dL (ref 6–20)
CO2: 26 mmol/L (ref 22–32)
Calcium: 9.1 mg/dL (ref 8.9–10.3)
Chloride: 98 mmol/L (ref 98–111)
Creatinine, Ser: 1.29 mg/dL — ABNORMAL HIGH (ref 0.61–1.24)
GFR, Estimated: >60 mL/min (ref >60)
Glucose, Bld: 104 mg/dL — ABNORMAL HIGH (ref 70–99)
Potassium: 3.7 mmol/L (ref 3.5–5.1)
Sodium: 132 mmol/L — ABNORMAL LOW (ref 135–145)
Total Bilirubin: 0.4 mg/dL (ref 0.0–1.2)
Total Protein: 8.6 g/dL — ABNORMAL HIGH (ref 6.5–8.1)

## 2024-02-12 LAB — PROTIME-INR
INR: 0.9 (ref 0.8–1.2)
Prothrombin Time: 12.7 s (ref 11.4–15.2)

## 2024-02-12 LAB — LACTIC ACID, PLASMA
Lactic Acid, Venous: 2.1 mmol/L (ref 0.5–1.9)
Lactic Acid, Venous: 2.3 mmol/L (ref 0.5–1.9)

## 2024-02-12 LAB — GLUCOSE, CAPILLARY
Glucose-Capillary: 82 mg/dL (ref 70–99)
Glucose-Capillary: 130 mg/dL — ABNORMAL HIGH (ref 70–99)

## 2024-02-12 LAB — MRSA NEXT GEN BY PCR, NASAL: MRSA by PCR Next Gen: DETECTED — AB

## 2024-02-12 MED ORDER — ONDANSETRON HCL 4 MG PO TABS: 4.0000 mg | ORAL_TABLET | Freq: Three times a day (TID) | ORAL | Status: DC | PRN

## 2024-02-12 MED ORDER — BACLOFEN 10 MG PO TABS
10.0000 mg | ORAL_TABLET | Freq: Three times a day (TID) | ORAL | Status: DC
  Administered 2024-02-12 – 2024-02-15 (×8): 10 mg via ORAL
  Filled 2024-02-12 (×8): qty 1

## 2024-02-12 MED ORDER — SENNA 8.6 MG PO TABS
2.0000 | ORAL_TABLET | Freq: Two times a day (BID) | ORAL | Status: DC
  Administered 2024-02-12 – 2024-02-15 (×6): 17.2 mg via ORAL
  Filled 2024-02-12 (×6): qty 2

## 2024-02-12 MED ORDER — FAMOTIDINE 20 MG PO TABS
20.0000 mg | ORAL_TABLET | Freq: Two times a day (BID) | ORAL | Status: DC
Start: 2024-02-12 — End: 2024-02-15
  Administered 2024-02-12 – 2024-02-15 (×6): 20 mg via ORAL
  Filled 2024-02-12 (×6): qty 1

## 2024-02-12 MED ORDER — CLONAZEPAM 0.5 MG PO TABS
1.0000 mg | ORAL_TABLET | Freq: Two times a day (BID) | ORAL | Status: DC
  Administered 2024-02-12 – 2024-02-15 (×6): 1 mg via ORAL
  Filled 2024-02-12 (×6): qty 2

## 2024-02-12 MED ORDER — ACETAMINOPHEN 325 MG PO TABS
650.0000 mg | ORAL_TABLET | Freq: Once | ORAL | Status: AC
  Administered 2024-02-12: 650 mg via ORAL
  Filled 2024-02-12: qty 2

## 2024-02-12 MED ORDER — FLUTICASONE PROPIONATE 50 MCG/ACT NA SUSP
1.0000 | Freq: Every day | NASAL | Status: DC
  Administered 2024-02-13 – 2024-02-15 (×3): 1 via NASAL
  Filled 2024-02-12 (×2): qty 16

## 2024-02-12 MED ORDER — CEFAZOLIN SODIUM-DEXTROSE 2-4 GM/100ML-% IV SOLN
2.0000 g | Freq: Three times a day (TID) | INTRAVENOUS | Status: DC
  Administered 2024-02-12 – 2024-02-15 (×8): 2 g via INTRAVENOUS
  Filled 2024-02-12 (×8): qty 100

## 2024-02-12 MED ORDER — INSULIN ASPART 100 UNIT/ML IJ SOLN
0.0000 [IU] | Freq: Three times a day (TID) | INTRAMUSCULAR | Status: DC
  Administered 2024-02-14 – 2024-02-15 (×2): 2 [IU] via SUBCUTANEOUS

## 2024-02-12 MED ORDER — SODIUM CHLORIDE 0.9 % IV BOLUS
1000.0000 mL | Freq: Once | INTRAVENOUS | Status: AC
  Administered 2024-02-12: 1000 mL via INTRAVENOUS

## 2024-02-12 MED ORDER — AMITRIPTYLINE HCL 10 MG PO TABS
10.0000 mg | ORAL_TABLET | Freq: Every day | ORAL | Status: DC
  Administered 2024-02-12 – 2024-02-14 (×3): 10 mg via ORAL
  Filled 2024-02-12 (×3): qty 1

## 2024-02-12 MED ORDER — LACTATED RINGERS IV SOLN: INTRAVENOUS | Status: AC

## 2024-02-12 MED ORDER — PRAVASTATIN SODIUM 40 MG PO TABS
20.0000 mg | ORAL_TABLET | Freq: Every day | ORAL | Status: DC
  Administered 2024-02-13 – 2024-02-15 (×3): 20 mg via ORAL
  Filled 2024-02-12 (×3): qty 1

## 2024-02-12 MED ORDER — CHLORHEXIDINE GLUCONATE CLOTH 2 % EX PADS
6.0000 | MEDICATED_PAD | Freq: Every day | CUTANEOUS | Status: DC
  Administered 2024-02-13 – 2024-02-15 (×3): 6 via TOPICAL

## 2024-02-12 MED ORDER — ARTIFICIAL TEARS OPHTHALMIC OINT: TOPICAL_OINTMENT | OPHTHALMIC | Status: DC | PRN

## 2024-02-12 MED ORDER — MUPIROCIN 2 % EX OINT
1.0000 | TOPICAL_OINTMENT | Freq: Two times a day (BID) | CUTANEOUS | Status: DC
  Administered 2024-02-12 – 2024-02-15 (×6): 1 via NASAL
  Filled 2024-02-12 (×2): qty 22

## 2024-02-12 MED ORDER — PREGABALIN 75 MG PO CAPS
150.0000 mg | ORAL_CAPSULE | Freq: Every day | ORAL | Status: DC
  Administered 2024-02-12 – 2024-02-14 (×3): 150 mg via ORAL
  Filled 2024-02-12 (×3): qty 2

## 2024-02-12 MED ORDER — VANCOMYCIN HCL IN DEXTROSE 1-5 GM/200ML-% IV SOLN
1000.0000 mg | Freq: Once | INTRAVENOUS | Status: AC
  Administered 2024-02-12: 1000 mg via INTRAVENOUS
  Filled 2024-02-12: qty 200

## 2024-02-12 MED ORDER — HYDROCORTISONE 1 % EX CREA
TOPICAL_CREAM | Freq: Three times a day (TID) | CUTANEOUS | Status: DC
  Filled 2024-02-12 (×3): qty 28

## 2024-02-12 MED ORDER — LORATADINE 10 MG PO TABS
10.0000 mg | ORAL_TABLET | Freq: Every day | ORAL | Status: DC
  Administered 2024-02-12 – 2024-02-15 (×4): 10 mg via ORAL
  Filled 2024-02-12 (×4): qty 1

## 2024-02-12 MED ORDER — SODIUM CHLORIDE 0.9 % IV SOLN
2.0000 g | Freq: Once | INTRAVENOUS | Status: AC
  Administered 2024-02-12: 2 g via INTRAVENOUS
  Filled 2024-02-12: qty 12.5

## 2024-02-12 MED ORDER — POLYETHYLENE GLYCOL 3350 17 G PO PACK
17.0000 g | PACK | Freq: Two times a day (BID) | ORAL | Status: DC
  Administered 2024-02-12 – 2024-02-15 (×4): 17 g via ORAL
  Filled 2024-02-12 (×6): qty 1

## 2024-02-12 MED ORDER — METRONIDAZOLE 500 MG/100ML IV SOLN
500.0000 mg | Freq: Once | INTRAVENOUS | Status: DC
  Administered 2024-02-12: 500 mg via INTRAVENOUS
  Filled 2024-02-12: qty 100

## 2024-02-12 MED ORDER — HYOSCYAMINE SULFATE ER 0.375 MG PO TB12
0.3750 mg | ORAL_TABLET | Freq: Two times a day (BID) | ORAL | Status: DC
  Administered 2024-02-12 – 2024-02-15 (×6): 0.375 mg via ORAL
  Filled 2024-02-12 (×8): qty 1

## 2024-02-12 MED ORDER — ENOXAPARIN SODIUM 40 MG/0.4ML IJ SOSY
40.0000 mg | PREFILLED_SYRINGE | INTRAMUSCULAR | Status: DC
  Administered 2024-02-12 – 2024-02-14 (×3): 40 mg via SUBCUTANEOUS
  Filled 2024-02-12 (×4): qty 0.4

## 2024-02-12 MED ORDER — INSULIN ASPART 100 UNIT/ML IJ SOLN: 0.0000 [IU] | Freq: Every day | INTRAMUSCULAR | Status: DC

## 2024-02-12 MED ORDER — MODAFINIL 200 MG PO TABS: 200.0000 mg | ORAL_TABLET | Freq: Every day | ORAL | Status: DC

## 2024-02-12 MED ORDER — LEVETIRACETAM 250 MG PO TABS
250.0000 mg | ORAL_TABLET | Freq: Two times a day (BID) | ORAL | Status: DC
  Administered 2024-02-12 – 2024-02-15 (×6): 250 mg via ORAL
  Filled 2024-02-12 (×6): qty 1

## 2024-02-12 MED ORDER — PANTOPRAZOLE SODIUM 40 MG PO TBEC
40.0000 mg | DELAYED_RELEASE_TABLET | Freq: Every day | ORAL | Status: DC
  Administered 2024-02-13 – 2024-02-14 (×2): 40 mg via ORAL
  Filled 2024-02-12 (×2): qty 1

## 2024-02-12 MED ORDER — LUBIPROSTONE 8 MCG PO CAPS
8.0000 ug | ORAL_CAPSULE | Freq: Every day | ORAL | Status: DC
Start: 2024-02-14 — End: 2024-02-15
  Administered 2024-02-14 – 2024-02-15 (×2): 8 ug via ORAL
  Filled 2024-02-12 (×2): qty 1

## 2024-02-12 MED ORDER — PREGABALIN 75 MG PO CAPS
75.0000 mg | ORAL_CAPSULE | Freq: Every day | ORAL | Status: DC
  Administered 2024-02-13 – 2024-02-15 (×3): 75 mg via ORAL
  Filled 2024-02-12 (×3): qty 1

## 2024-02-12 NOTE — Sepsis Progress Note (Signed)
 Sepsis protocol is being followed by [facility].

## 2024-02-12 NOTE — ED Triage Notes (Signed)
 Pt BIB [facility] from [facility]. Per facility pt had COVID vaccination yesterday and developed a fever this morning. Facility gave 1000 mg tylenol  and wife wanted pt transferred here for evaluation.

## 2024-02-12 NOTE — Progress Notes (Signed)
 CODE SEPSIS - PHARMACY COMMUNICATION  **Broad Spectrum Antibiotics should be administered within 1 hour of Sepsis diagnosis**  Time Code Sepsis Called/Page Received: 1555  Antibiotics Ordered: cefepime , metronidazole , vancomycin   Time of 1st antibiotic administration: 1606  Additional action taken by pharmacy: N/A  If necessary, Name of Provider/Nurse Contacted: N/A    Alice Innocent ,PharmD Clinical Pharmacist  02/12/2024  4:09 PM

## 2024-02-12 NOTE — H&P (Signed)
 History and Physical    Patient: Vincent Anthony FAO:[id number] DOB: 03-30-1971 DOA: 02/12/2024 DOS: the patient was seen and examined on 02/12/2024 PCP: System, Provider Not In  Patient coming from: SNF  Chief Complaint:  Chief Complaint  Patient presents with   Fever   HPI: Vincent Anthony is a 53 y.o. male with medical history significant of multiple sclerosis with neurogenic bladder and neurogenic bowel, type 2 diabetes, history of elevated LFTs, GERD.  Patient provides the history.  He was at [facility] and got a COVID vaccination yesterday.  Today, he had a fever of 103 and was brought to the hospital by EMS.  He denies abdominal pain, cough, wheezing, chest pain he does note that he has been having a rash on his thigh.  He has a lot of itching and has scratched at the area.  Denies diarrhea, nausea, vomiting.  His appetite is otherwise pretty good.  Review of Systems: As mentioned in the history of present illness. All other systems reviewed and are negative. Past Medical History:  Diagnosis Date   Arthritis    Depression    Diabetes mellitus without complication (HCC)    Elevated LFTs    Elevated liver enzymes    GERD (gastroesophageal reflux disease)    Headache    MS (multiple sclerosis) (HCC)    Neurogenic bladder    Neuromuscular disorder (HCC)    Tremor    Past Surgical History:  Procedure Laterality Date   COLONOSCOPY  2014   COLONOSCOPY WITH PROPOFOL  N/A 05/01/2022   Procedure: COLONOSCOPY WITH PROPOFOL ;  Surgeon: Suzette Espy, MD;  Location: [facility];  Service: Endoscopy;  Laterality: N/A;  9:15am   FLEXIBLE SIGMOIDOSCOPY N/A 08/11/2023   Procedure: FLEXIBLE SIGMOIDOSCOPY;  Surgeon: Suzette Espy, MD;  Location: [facility];  Service: Endoscopy;  Laterality: N/A;  11:00am, asa 3 (at [facility])   HERNIA REPAIR     KNEE SURGERY     TESTICLE REMOVAL     Social History:  reports that he has never smoked. He has never used smokeless tobacco.  He reports that he does not drink alcohol and does not use drugs.  Allergies  Allergen Reactions   Celebrex [Celecoxib] Nausea And Vomiting   Clindamycin Diarrhea   Vioxx [Rofecoxib] Nausea And Vomiting   Zanaflex [Tizanidine Hcl] Nausea And Vomiting    Family History  Problem Relation Age of Onset   Heart attack Mother    Diabetes Father    Hypertension Father    Colon cancer Neg Hx    Colonic polyp Neg Hx     Prior to Admission medications   Medication Sig Start Date End Date Taking? Authorizing Provider  LINZESS  145 MCG CAPS capsule Take 145 mcg by mouth daily. 11/27/23  Yes [provider]  amitriptyline  (ELAVIL ) 10 MG tablet Take 1 tablet (10 mg total) by mouth at bedtime. Hold until linezolid  course has finished Patient taking differently: Take 10 mg by mouth at bedtime. 01/06/21   Pokhrel, Laxman, MD  artificial tears (LACRILUBE) OINT ophthalmic ointment Place into both eyes every 4 (four) hours as needed for dry eyes. 01/06/21   Pokhrel, Laxman, MD  baclofen  (LIORESAL ) 10 MG tablet Take 1 tablet by mouth 3 (three) times daily. 11/09/14   [provider]  bisacodyl  5 MG EC tablet As directed 09/10/22   Colin Dawley, MD  cetirizine (ZYRTEC) 10 MG tablet Take 10 mg by mouth daily.    [provider]  Cholecalciferol  (VITAMIN D3) 50 MCG (2000 UT) TABS Take 2,000 Units by mouth daily.    [provider]  clonazePAM  (KLONOPIN ) 1 MG tablet Take 1 mg by mouth 2 (two) times daily.    [provider]  clotrimazole  (LOTRIMIN ) 1 % cream Apply to affected area (groin area) 2 times daily for two weeks 01/25/24   Keith, Kayla N, PA-C  Emollient (EUCERIN) lotion Apply 1 Application topically every 12 (twelve) hours as needed for dry skin.    [provider]  famotidine (PEPCID) 20 MG tablet Take 20 mg by mouth 2 (two) times daily.    [provider]  fluticasone (FLONASE) 50 MCG/ACT nasal spray Place 1 spray into both nostrils  daily.    [provider]  hydrocortisone  cream 1 % Apply to affected area (ears) 2-4 times daily 01/25/24   Keith, Kayla N, PA-C  hyoscyamine  (LEVBID) 0.375 MG 12 hr tablet TAKE (1) TABLET BYMOUTH EVERY TWELVE HOURS. 09/16/20   Delman Ferns, NP  levETIRAcetam  (KEPPRA ) 250 MG tablet Take 250 mg by mouth 2 (two) times daily. 11/09/14   [provider]  lubiprostone  (AMITIZA ) 8 MCG capsule Take by mouth. 05/07/23   [provider]  metFORMIN (GLUCOPHAGE) 500 MG tablet Take 500 mg by mouth 2 (two) times daily.    [provider]  modafinil (PROVIGIL) 200 MG tablet Take 200 mg by mouth daily.    [provider]  Multiple Vitamin (MULTIVITAMIN WITH MINERALS) TABS tablet Take 1 tablet by mouth daily.    [provider]  [facility] powder Apply 1 Application topically 2 (two) times daily as needed (irritation, rash, yeast). 01/20/24   [provider]  ondansetron  (ZOFRAN ) 4 MG tablet Take 4 mg by mouth every 8 (eight) hours as needed for nausea or vomiting.    [provider]  pantoprazole  (PROTONIX ) 40 MG tablet Take 1 tablet (40 mg total) by mouth at bedtime. 01/06/21   Pokhrel, Laxman, MD  polyethylene glycol (MIRALAX  / GLYCOLAX ) 17 g packet Take 17 g by mouth 2 (two) times daily. Patient taking differently: Take 8.5 g by mouth See admin instructions. Monday,Wednesday Friday and Sunday 09/10/22   Colin Dawley, MD  pravastatin  (PRAVACHOL ) 20 MG tablet Take 20 mg by mouth daily.    [provider]  pregabalin  (LYRICA ) 150 MG capsule Take 150 mg by mouth at bedtime. 02/06/22   [provider]  pregabalin  (LYRICA ) 75 MG capsule Take 75 mg by mouth daily.    [provider]  senna (SENOKOT) 8.6 MG tablet Take 2 tablets (17.2 mg total) by mouth 2 (two) times daily. Hold for loose stools >2/day 09/10/22   Colin Dawley, MD    Physical Exam: Vitals:   02/12/24 1530 02/12/24 1607 02/12/24 1615 02/12/24 1630  BP:  123/70  114/71 112/85  Pulse: (!) 101  99 (!) 102  Resp: 15  20 18   Temp:  99.7 F (37.6 C)    TempSrc:  Oral    SpO2: 95%  95% 94%  Weight:      Height:       General: Middle-age male. Awake and alert and oriented x3. No acute cardiopulmonary distress.  HEENT: Normocephalic atraumatic.  Right and left ears normal in appearance.  Pupils equal, round, reactive to light. Extraocular muscles are intact. Sclerae anicteric and noninjected.  Moist mucosal membranes. No mucosal lesions.  Neck: Neck supple without lymphadenopathy. No carotid bruits. No masses palpated.  Cardiovascular: Regular rate with normal S1-S2  sounds. No murmurs, rubs, gallops auscultated. No JVD.  Respiratory: Good respiratory effort with no wheezes, rales, rhonchi. Lungs clear to auscultation bilaterally.  No accessory muscle use. Abdomen: Soft, nontender, nondistended. Active bowel sounds. No masses or hepatosplenomegaly  Skin: There is a 5 x 7 erythemic and warm area under the right inner thigh.  There is a central lesion that is draining some clear fluid.  This may be the site of the infection.  Dry, warm to touch. 2+ dorsalis pedis and radial pulses. Musculoskeletal: No calf or leg pain. All major joints not erythematous nontender.  No upper or lower joint deformation.  Good ROM.  No contractures  Psychiatric: Intact judgment and insight. Pleasant and cooperative.   Data Reviewed: Results for orders placed or performed during the hospital encounter of 02/12/24 (from the past 24 hours)  Lactic acid, plasma     Status: Abnormal   Collection Time: 02/12/24  1:34 PM  Result Value Ref Range   Lactic Acid, Venous 2.1 (HH) 0.5 - 1.9 mmol/L  Comprehensive metabolic panel     Status: Abnormal   Collection Time: 02/12/24  1:34 PM  Result Value Ref Range   Sodium 132 (L) 135 - 145 mmol/L   Potassium 3.7 3.5 - 5.1 mmol/L   Chloride 98 98 - 111 mmol/L   CO2 26 22 - 32 mmol/L   Glucose, Bld 104 (H) 70 - 99 mg/dL   BUN 14 6 -  20 mg/dL   Creatinine, Ser 5.40 (H) 0.61 - 1.24 mg/dL   Calcium 9.1 8.9 - 98.1 mg/dL   Total Protein 8.6 (H) 6.5 - 8.1 g/dL   Albumin 4.1 3.5 - 5.0 g/dL   AST 17 15 - 41 U/L   ALT 13 0 - 44 U/L   Alkaline Phosphatase 134 (H) 38 - 126 U/L   Total Bilirubin 0.4 0.0 - 1.2 mg/dL   GFR, Estimated >19 >14 mL/min   Anion gap 8 5 - 15  Protime-INR     Status: None   Collection Time: 02/12/24  1:34 PM  Result Value Ref Range   Prothrombin Time 12.7 11.4 - 15.2 seconds   INR 0.9 0.8 - 1.2  Blood Culture (routine x 2)     Status: None (Preliminary result)   Collection Time: 02/12/24  1:34 PM   Specimen: Left Antecubital; Blood  Result Value Ref Range   Specimen Description      LEFT ANTECUBITAL BOTTLES DRAWN AEROBIC AND ANAEROBIC   Special Requests      Blood Culture adequate volume Performed at [facility], [address] [place], [place] [address]    Culture PENDING    Report Status PENDING   Blood Culture (routine x 2)     Status: None (Preliminary result)   Collection Time: 02/12/24  1:57 PM   Specimen: BLOOD LEFT HAND  Result Value Ref Range   Specimen Description BLOOD LEFT HAND AEROBIC BOTTLE ONLY    Special Requests      Blood Culture results may not be optimal due to an inadequate volume of blood received in culture bottles Performed at [facility], [address] [place], [place] [address]    Culture PENDING    Report Status PENDING   CBC with Differential/Platelet     Status: Abnormal   Collection Time: 02/12/24  2:37 PM  Result Value Ref Range   WBC 12.5 (H) 4.0 - 10.5 K/uL   RBC 4.94 4.22 - 5.81 MIL/uL   Hemoglobin 13.1 13.0 -  17.0 g/dL   HCT 16.1 09.6 - 04.5 %   MCV 82.6 80.0 - 100.0 fL   MCH 26.5 26.0 - 34.0 pg   MCHC 32.1 30.0 - 36.0 g/dL   RDW 40.9 (H) 81.1 - 91.4 %   Platelets 316 150 - 400 K/uL   nRBC 0.0 0.0 - 0.2 %   Neutrophils Relative % 91 %   Neutro Abs 11.3 (H) 1.7 - 7.7 K/uL   Lymphocytes Relative 5 %   Lymphs Abs 0.6 (L) 0.7 - 4.0 K/uL    Monocytes Relative 4 %   Monocytes Absolute 0.5 0.1 - 1.0 K/uL   Eosinophils Relative 0 %   Eosinophils Absolute 0.0 0.0 - 0.5 K/uL   Basophils Relative 0 %   Basophils Absolute 0.0 0.0 - 0.1 K/uL   Immature Granulocytes 0 %   Abs Immature Granulocytes 0.05 0.00 - 0.07 K/uL  Urinalysis, w/ Reflex to Culture (Infection Suspected) -Urine, Clean Catch     Status: None   Collection Time: 02/12/24  3:14 PM  Result Value Ref Range   Specimen Source URINE, CLEAN CATCH    Color, Urine YELLOW YELLOW   APPearance CLEAR CLEAR   Specific Gravity, Urine 1.012 1.005 - 1.030   pH 6.0 5.0 - 8.0   Glucose, UA NEGATIVE NEGATIVE mg/dL   Hgb urine dipstick NEGATIVE NEGATIVE   Bilirubin Urine NEGATIVE NEGATIVE   Ketones, ur NEGATIVE NEGATIVE mg/dL   Protein, ur NEGATIVE NEGATIVE mg/dL   Nitrite NEGATIVE NEGATIVE   Leukocytes,Ua NEGATIVE NEGATIVE   RBC / HPF 0-5 0 - 5 RBC/hpf   WBC, UA 0-5 0 - 5 WBC/hpf   Bacteria, UA NONE SEEN NONE SEEN   Squamous Epithelial / HPF 0-5 0 - 5 /HPF  Lactic acid, plasma     Status: Abnormal   Collection Time: 02/12/24  3:35 PM  Result Value Ref Range   Lactic Acid, Venous 2.3 (HH) 0.5 - 1.9 mmol/L    DG Chest Port 1 View Result Date: 02/12/2024 CLINICAL DATA:  Questionable sepsis - evaluate for abnormality Fever this morning.  COVID vaccine yesterday. EXAM: PORTABLE CHEST 1 VIEW COMPARISON:  Radiograph 01/09/2024 FINDINGS: Stable heart size and mediastinal contours. Unchanged left lung base scarring. No acute airspace disease. No pulmonary edema, large pleural effusion or pneumothorax. No acute osseous abnormalities are seen. IMPRESSION: Unchanged left lung base scarring. No acute chest findings. Electronically Signed   By: Chadwick Colonel M.D.   On: 02/12/2024 14:07     Assessment and Plan: No notes have been filed under this hospital service. Service: Hospitalist  Principal Problem:   SIRS (systemic inflammatory response syndrome) (HCC) Active Problems:   MS  (multiple sclerosis) (HCC)   Seizures (HCC)   Constipation due to neurogenic bowel   Diabetes mellitus without complication (HCC)   Neuromuscular disorder (HCC)   GERD (gastroesophageal reflux disease)   Depression   Cellulitis of left thigh  Sepsis from cellulitis of left thigh. Tailor antibiotics to Ancef .  Blood cultures pending. MRSA swab -not purulent, will not empirically treat with MRSA coverage. Will give patient's fluid and recheck creatinine and CBC in the morning Fever is improving Does have mild AKI which we will recheck in the morning Diabetes Hold metformin Sliding scale insulin  and CBGs Seizure disorder Continue Keppra  Multiple sclerosis with neurogenic bladder and bowel  GERD Continue PPI and Pepcid   Advance Care Planning:   Code Status: Full Code confirmed by patient  Consults: None  Family Communication: None  Severity of Illness: The appropriate patient status for this patient is INPATIENT. Inpatient status is judged to be reasonable and necessary in order to provide the required intensity of service to ensure the patient's safety. The patient's presenting symptoms, physical exam findings, and initial radiographic and laboratory data in the context of their chronic comorbidities is felt to place them at high risk for further clinical deterioration. Furthermore, it is not anticipated that the patient will be medically stable for discharge from the hospital within 2 midnights of admission.   * I certify that at the point of admission it is my clinical judgment that the patient will require inpatient hospital care spanning beyond 2 midnights from the point of admission due to high intensity of service, high risk for further deterioration and high frequency of surveillance required.*  Author: Sie Formisano J Everleigh Colclasure, DO 02/12/2024 5:09 PM  For on call review [email or web address].

## 2024-02-12 NOTE — ED Provider Notes (Signed)
 [place] EMERGENCY DEPARTMENT AT [facility] Provider Note   CSN: [id number] Arrival date & time: 02/12/24  1312     History  Chief Complaint  Patient presents with   Fever    Vincent Anthony is a 53 y.o. male.  Patient with history of diabetes, GERD, MS, neurogenic bladder presents today from [facility] with complaints of fever. He states that he got a COVID vaccine yesterday and since then has been feeling generally weak. Reports that he feels similar to when he has had COVID vaccinations previously. He states that today the facility checked his temperature and noted that it was elevated. He called his wife who recommended he come here for evaluation. Endorses a slight headache, but denies vision changes or weakness, numbness/tingling unchanged from his baseline due to MS. Patient denies any neck stiffness, cough, congestion, nausea, vomiting, diarrhea, abdominal pain, or urinary symptoms. Does note that he has had UTIs previously. Denies any wounds. He is able to walk.  The history is provided by the patient. No language interpreter was used.  Fever      Home Medications Prior to Admission medications   Medication Sig Start Date End Date Taking? Authorizing Provider  amitriptyline  (ELAVIL ) 10 MG tablet Take 1 tablet (10 mg total) by mouth at bedtime. Hold until linezolid  course has finished Patient taking differently: Take 10 mg by mouth at bedtime. 01/06/21   Pokhrel, Laxman, MD  artificial tears (LACRILUBE) OINT ophthalmic ointment Place into both eyes every 4 (four) hours as needed for dry eyes. 01/06/21   Pokhrel, Laxman, MD  baclofen  (LIORESAL ) 10 MG tablet Take 1 tablet by mouth 3 (three) times daily. 11/09/14   [provider]  bisacodyl  5 MG EC tablet As directed 09/10/22   Colin Dawley, MD  cetirizine (ZYRTEC) 10 MG tablet Take 10 mg by mouth daily.    [provider]  Cholecalciferol  (VITAMIN D3) 50 MCG (2000 UT) TABS Take 2,000  Units by mouth daily.    [provider]  clonazePAM  (KLONOPIN ) 1 MG tablet Take 1 mg by mouth 2 (two) times daily.    [provider]  clotrimazole  (LOTRIMIN ) 1 % cream Apply to affected area (groin area) 2 times daily for two weeks 01/25/24   Keith, Kayla N, PA-C  Emollient (EUCERIN) lotion Apply 1 Application topically every 12 (twelve) hours as needed for dry skin.    [provider]  famotidine (PEPCID) 20 MG tablet Take 20 mg by mouth 2 (two) times daily.    [provider]  fluticasone (FLONASE) 50 MCG/ACT nasal spray Place 1 spray into both nostrils daily.    [provider]  hydrocortisone  cream 1 % Apply to affected area (ears) 2-4 times daily 01/25/24   Keith, Kayla N, PA-C  hyoscyamine  (LEVBID) 0.375 MG 12 hr tablet TAKE (1) TABLET BYMOUTH EVERY TWELVE HOURS. 09/16/20   Delman Ferns, NP  levETIRAcetam  (KEPPRA ) 250 MG tablet Take 250 mg by mouth 2 (two) times daily. 11/09/14   [provider]  lubiprostone  (AMITIZA ) 8 MCG capsule Take by mouth. 05/07/23   [provider]  metFORMIN (GLUCOPHAGE) 500 MG tablet Take 500 mg by mouth 2 (two) times daily.    [provider]  modafinil (PROVIGIL) 200 MG tablet Take 200 mg by mouth daily.    [provider]  Multiple Vitamin (MULTIVITAMIN WITH MINERALS) TABS tablet Take 1 tablet by mouth daily.    [provider]  ondansetron  (ZOFRAN ) 4 MG tablet  Take 4 mg by mouth every 8 (eight) hours as needed for nausea or vomiting.    [provider]  pantoprazole  (PROTONIX ) 40 MG tablet Take 1 tablet (40 mg total) by mouth at bedtime. 01/06/21   Pokhrel, Laxman, MD  polyethylene glycol (MIRALAX  / GLYCOLAX ) 17 g packet Take 17 g by mouth 2 (two) times daily. Patient taking differently: Take 8.5 g by mouth See admin instructions. Monday,Wednesday Friday and Sunday 09/10/22   Colin Dawley, MD  pravastatin  (PRAVACHOL ) 20 MG tablet Take 20 mg by mouth daily.     [provider]  pregabalin  (LYRICA ) 150 MG capsule Take 150 mg by mouth at bedtime. 02/06/22   [provider]  pregabalin  (LYRICA ) 75 MG capsule Take 75 mg by mouth daily.    [provider]  senna (SENOKOT) 8.6 MG tablet Take 2 tablets (17.2 mg total) by mouth 2 (two) times daily. Hold for loose stools >2/day 09/10/22   Colin Dawley, MD      Allergies    Celebrex [celecoxib], Clindamycin, Vioxx [rofecoxib], and Zanaflex [tizanidine hcl]    Review of Systems   Review of Systems  Constitutional:  Positive for fever.  All other systems reviewed and are negative.   Physical Exam Updated Vital Signs Pulse (!) 109   Temp (!) 103.1 F (39.5 C) (Oral)   Ht 6' 3.5" (1.918 m)   Wt 109.8 kg   SpO2 95%   BMI 29.85 kg/m  Physical Exam Vitals and nursing note reviewed.  Constitutional:      General: He is not in acute distress.    Appearance: Normal appearance. He is normal weight. He is not ill-appearing, toxic-appearing or diaphoretic.  HENT:     Head: Normocephalic and atraumatic.  Neck:     Comments: No meningismus Cardiovascular:     Rate and Rhythm: Normal rate and regular rhythm.     Heart sounds: Normal heart sounds.  Pulmonary:     Effort: Pulmonary effort is normal. No respiratory distress.     Breath sounds: Normal breath sounds.  Abdominal:     General: Abdomen is flat.     Palpations: Abdomen is soft.     Tenderness: There is no abdominal tenderness.  Musculoskeletal:        General: Normal range of motion.     Cervical back: Normal range of motion and neck supple.     Right lower leg: No edema.     Left lower leg: No edema.  Skin:    General: Skin is warm and dry.  Neurological:     General: No focal deficit present.     Mental Status: He is alert.  Psychiatric:        Mood and Affect: Mood normal.        Behavior: Behavior normal.     ED Results / Procedures / Treatments   Labs (all labs ordered are listed, but only  abnormal results are displayed) Labs Reviewed  LACTIC ACID, PLASMA - Abnormal; Notable for the following components:      Result Value   Lactic Acid, Venous 2.1 (*)    All other components within normal limits  LACTIC ACID, PLASMA - Abnormal; Notable for the following components:   Lactic Acid, Venous 2.3 (*)    All other components within normal limits  COMPREHENSIVE METABOLIC PANEL WITH GFR - Abnormal; Notable for the following components:   Sodium 132 (*)    Glucose, Bld 104 (*)    Creatinine, Ser 1.29 (*)  Total Protein 8.6 (*)    Alkaline Phosphatase 134 (*)    All other components within normal limits  CBC WITH DIFFERENTIAL/PLATELET - Abnormal; Notable for the following components:   WBC 12.5 (*)    RDW 15.9 (*)    Neutro Abs 11.3 (*)    Lymphs Abs 0.6 (*)    All other components within normal limits  CULTURE, BLOOD (ROUTINE X 2)  CULTURE, BLOOD (ROUTINE X 2)  RESP PANEL BY RT-PCR (RSV, FLU A&B, COVID)  [id number]  PROTIME-INR  URINALYSIS, W/ REFLEX TO CULTURE (INFECTION SUSPECTED)  CBC WITH DIFFERENTIAL/PLATELET    EKG EKG Interpretation Date/Time:  Saturday Feb 12 2024 13:32:00 EDT Ventricular Rate:  113 PR Interval:  142 QRS Duration:  81 QT Interval:  294 QTC Calculation: 403 R Axis:   -85  Text Interpretation: Sinus tachycardia Probable left atrial enlargement Inferior infarct, old Consider anterior infarct Since last tracing rate faster Confirmed by Early Glisson ([id number]) on 02/12/2024 3:15:38 PM  Radiology DG Chest Port 1 View Result Date: 02/12/2024 CLINICAL DATA:  Questionable sepsis - evaluate for abnormality Fever this morning.  COVID vaccine yesterday. EXAM: PORTABLE CHEST 1 VIEW COMPARISON:  Radiograph 01/09/2024 FINDINGS: Stable heart size and mediastinal contours. Unchanged left lung base scarring. No acute airspace disease. No pulmonary edema, large pleural effusion or pneumothorax. No acute osseous abnormalities are seen. IMPRESSION: Unchanged left lung  base scarring. No acute chest findings. Electronically Signed   By: Chadwick Colonel M.D.   On: 02/12/2024 14:07    Procedures .Critical Care  Performed by: Sherra Dk, PA-C Authorized by: Abdullah Rizzi A, PA-C   Critical care provider statement:    Critical care time (minutes):  35   Critical care was necessary to treat or prevent imminent or life-threatening deterioration of the following conditions:  Sepsis   Critical care was time spent personally by me on the following activities:  Development of treatment plan with patient or surrogate, evaluation of patient's response to treatment, examination of patient, obtaining history from patient or surrogate, ordering and review of laboratory studies, ordering and review of radiographic studies, pulse oximetry, re-evaluation of patient's condition and review of old charts     Medications Ordered in ED Medications  lactated ringers  infusion (has no administration in time range)  ceFEPIme  (MAXIPIME ) 2 g in sodium chloride  0.9 % 100 mL IVPB (has no administration in time range)  metroNIDAZOLE  (FLAGYL ) IVPB 500 mg (has no administration in time range)  vancomycin  (VANCOCIN ) IVPB 1000 mg/200 mL premix (has no administration in time range)  sodium chloride  0.9 % bolus 1,000 mL (0 mLs Intravenous Stopped 02/12/24 1503)  acetaminophen  (TYLENOL ) tablet 650 mg (650 mg Oral Given 02/12/24 1336)    ED Course/ Medical Decision Making/ A&P                                 Medical Decision Making Amount and/or Complexity of Data Reviewed Labs: ordered. Radiology: ordered.  Risk OTC drugs.   This patient is a 53 y.o. male who presents to the ED for concern of fever, this involves an extensive number of treatment options, and is a complaint that carries with it a high risk of complications and morbidity. The emergent differential diagnosis prior to evaluation includes, but is not limited to, sepsis, URI, UTI . This is not an exhaustive  differential.   Past Medical History / Co-morbidities / Social History:  has a  past medical history of Arthritis, Depression, Diabetes mellitus without complication (HCC), Elevated LFTs, Elevated liver enzymes, GERD (gastroesophageal reflux disease), Headache, MS (multiple sclerosis) (HCC), Neurogenic bladder, Neuromuscular disorder (HCC), and Tremor.  Additional history: Chart reviewed. Pertinent results include: admitted for UTI previously.   Physical Exam: Physical exam performed. The pertinent findings include: no acute physical exam abnormalities  Lab Tests: I ordered, and personally interpreted labs.  The pertinent results include:  WBC 12.5, Na 132, creatinine 1.29, lactic 2.1 --> 2.3. UA noninfectious   Imaging Studies: I ordered imaging studies including Dg chest. I independently visualized and interpreted imaging which showed   Unchanged left lung base scarring. No acute chest findings.   I agree with the radiologist interpretation.   Cardiac Monitoring:  The patient was maintained on a cardiac monitor.  Cardiac monitor showed an underlying rhythm of: sinus tachycardia. I agree with this interpretation.   Medications: I ordered medication including fluids, vancomycin , cefepime , and Flagyl  for sepsis, unknown origin. Reevaluation of the patient after these medicines showed that the patient improved. I have reviewed the patients home medicines and have made adjustments as needed.   Disposition: After consideration of the diagnostic results and the patients response to treatment, I feel that patient will require admission for sepsis of unknown origin.  Discussed same with patient's understanding and agreement with this plan.   My attending Dr. Annabell Key discussed patient with hospitalist Dr. Cathyann Cobia who accepts patient for admission.  I discussed this case with my attending physician Dr. Annabell Key who cosigned this note including patient's presenting symptoms, physical exam, and  planned diagnostics and interventions. Attending physician stated agreement with plan or made changes to plan which were implemented.    Final Clinical Impression(s) / ED Diagnoses Final diagnoses:  Sepsis without acute organ dysfunction, due to unspecified organism [facility])    Rx / DC Orders ED Discharge Orders     None         Fredna Jasper 02/12/24 1647    Early Glisson, MD 02/13/24 1445

## 2024-02-13 DIAGNOSIS — R6510 Systemic inflammatory response syndrome (SIRS) of non-infectious origin without acute organ dysfunction: Secondary | ICD-10-CM

## 2024-02-13 DIAGNOSIS — R509 Fever, unspecified: Secondary | ICD-10-CM | POA: Diagnosis not present

## 2024-02-13 DIAGNOSIS — L03116 Cellulitis of left lower limb: Secondary | ICD-10-CM

## 2024-02-13 DIAGNOSIS — A419 Sepsis, unspecified organism: Principal | ICD-10-CM

## 2024-02-13 LAB — HEMOGLOBIN A1C
Hgb A1c MFr Bld: 6 % — ABNORMAL HIGH (ref 4.8–5.6)
Mean Plasma Glucose: 125.5 mg/dL

## 2024-02-13 LAB — BASIC METABOLIC PANEL WITH GFR
Anion gap: 7 (ref 5–15)
BUN: 14 mg/dL (ref 6–20)
CO2: 28 mmol/L (ref 22–32)
Calcium: 8.9 mg/dL (ref 8.9–10.3)
Chloride: 102 mmol/L (ref 98–111)
Creatinine, Ser: 1.39 mg/dL — ABNORMAL HIGH (ref 0.61–1.24)
GFR, Estimated: >60 mL/min (ref >60)
Glucose, Bld: 85 mg/dL (ref 70–99)
Potassium: 3.4 mmol/L — ABNORMAL LOW (ref 3.5–5.1)
Sodium: 137 mmol/L (ref 135–145)

## 2024-02-13 LAB — CBC
HCT: 40.3 % (ref 39.0–52.0)
Hemoglobin: 12.3 g/dL — ABNORMAL LOW (ref 13.0–17.0)
MCH: 25.5 pg — ABNORMAL LOW (ref 26.0–34.0)
MCHC: 30.5 g/dL (ref 30.0–36.0)
MCV: 83.4 fL (ref 80.0–100.0)
Platelets: 256 10*3/uL (ref 150–400)
RBC: 4.83 MIL/uL (ref 4.22–5.81)
RDW: 16.1 % — ABNORMAL HIGH (ref 11.5–15.5)
WBC: 9.6 10*3/uL (ref 4.0–10.5)
nRBC: 0 % (ref 0.0–0.2)

## 2024-02-13 LAB — GLUCOSE, CAPILLARY
Glucose-Capillary: 74 mg/dL (ref 70–99)
Glucose-Capillary: 82 mg/dL (ref 70–99)
Glucose-Capillary: 98 mg/dL (ref 70–99)
Glucose-Capillary: 102 mg/dL — ABNORMAL HIGH (ref 70–99)

## 2024-02-13 LAB — HIV ANTIBODY (ROUTINE TESTING W REFLEX): HIV Screen 4th Generation wRfx: NONREACTIVE

## 2024-02-13 MED ORDER — POTASSIUM CHLORIDE CRYS ER 20 MEQ PO TBCR
40.0000 meq | EXTENDED_RELEASE_TABLET | ORAL | Status: AC
  Administered 2024-02-13 (×2): 40 meq via ORAL
  Filled 2024-02-13 (×2): qty 2

## 2024-02-13 NOTE — Progress Notes (Signed)
 PROGRESS NOTE  Vincent Anthony, is a 53 y.o. male, DOB - [birth year], UJW:[id number]  Admit date - 02/12/2024   Admitting Physician Malka Sea, DO  Outpatient Primary MD for the patient is System, Provider Not In  LOS - 1  Chief Complaint  Patient presents with   Fever       Brief Narrative:   53 y.o. male with medical history significant of multiple sclerosis with neurogenic bladder and neurogenic bowel, type 2 diabetes, history of elevated LFTs, GERD.  Admitted on 02/12/2024 from [facility] SNF with concerns for sepsis due to left thigh cellulitis    -Assessment and Plan: 1)Sepsis due to Lt Thigh Cellulitis---POA -Sepsis criteria on admission with fevers over 103, tachycardia into the 110s, tachypnea RR 22, and leukocytosis WBC 12.5 >>9.6 - No further fevers, overall sepsis pathophysiology appears to be resolving - Get left Thigh soft tissue ultrasound to exclude abscess - Continue IV Ancef   2) multiple sclerosis--with neuromuscular deficits and neurogenic bowel and neurogenic bladder -- Continue supportive care - Continue laxatives  3) seizure disorder--continue Keppra   4)GERD--- continue PPI  5)DM2-A1c 6.0 reflecting excellent diabetic control PTA  --continue to hold metformin Use Novolog /Humalog Sliding scale insulin  with Accu-Cheks/Fingersticks as ordered   6)AKI----acute kidney injury    creatinine on admission= 1.29  ,  baseline creatinine = 1.0 in January 2025    ,  -creatinine is now= up to 1.39, - renally adjust medications, avoid nephrotoxic agents / dehydration  / hypotension  7)Hypokalemia--- replace and recheck  Status is: Inpatient   Disposition: The patient is from: SNF              Anticipated d/c is to: SNF              Anticipated d/c date is: 2 days              Patient currently is not medically stable to d/c. Barriers: Not Clinically Stable-   Code Status :  -  Code Status: Full Code   Family Communication:    NA (patient is  alert, awake and coherent)   DVT Prophylaxis  :   - SCDs  enoxaparin  (LOVENOX ) injection 40 mg Start: 02/12/24 2200   Lab Results  Component Value Date   PLT 256 02/13/2024    Inpatient Medications  Scheduled Meds:  amitriptyline   10 mg Oral QHS   baclofen   10 mg Oral TID   Chlorhexidine  Gluconate Cloth  6 each Topical Daily   clonazePAM   1 mg Oral BID   enoxaparin  (LOVENOX ) injection  40 mg Subcutaneous Q24H   famotidine  20 mg Oral BID   fluticasone  1 spray Each Nare Daily   hydrocortisone  cream   Topical TID   hyoscyamine   0.375 mg Oral Q12H   insulin  aspart  0-15 Units Subcutaneous TID WC   insulin  aspart  0-5 Units Subcutaneous QHS   levETIRAcetam   250 mg Oral BID   loratadine   10 mg Oral Daily   [START ON 02/14/2024] lubiprostone   8 mcg Oral Q breakfast   modafinil  200 mg Oral Daily   mupirocin ointment  1 Application Nasal BID   pantoprazole   40 mg Oral QHS   polyethylene glycol  17 g Oral BID   pravastatin   20 mg Oral Daily   pregabalin   150 mg Oral QHS   pregabalin   75 mg Oral Daily   senna  2 tablet Oral BID   Continuous Infusions:   ceFAZolin  (ANCEF ) IV  2 g (02/13/24 1423)   PRN Meds:.artificial tears, ondansetron    Anti-infectives (From admission, onward)    Start     Dose/Rate Route Frequency Ordered Stop   02/12/24 2200  ceFAZolin  (ANCEF ) IVPB 2g/100 mL premix        2 g 200 mL/hr over 30 Minutes Intravenous Every 8 hours 02/12/24 1746 02/19/24 2159   02/12/24 1600  ceFEPIme  (MAXIPIME ) 2 g in sodium chloride  0.9 % 100 mL IVPB        2 g 200 mL/hr over 30 Minutes Intravenous  Once 02/12/24 1555 02/12/24 1653   02/12/24 1600  metroNIDAZOLE  (FLAGYL ) IVPB 500 mg  Status:  Discontinued        500 mg 100 mL/hr over 60 Minutes Intravenous  Once 02/12/24 1555 02/12/24 2101   02/12/24 1600  vancomycin  (VANCOCIN ) IVPB 1000 mg/200 mL premix        1,000 mg 200 mL/hr over 60 Minutes Intravenous  Once 02/12/24 1555 02/12/24 1903          Subjective: Vincent Anthony today has no further fevers, no emesis,  No chest pain,   - Constipation concerns   Objective: Vitals:   02/12/24 2114 02/13/24 0203 02/13/24 0409 02/13/24 1349  BP: 105/69 111/73 109/67 121/75  Pulse: 90 90 88 92  Resp: 18 18 18 20   Temp: 99.2 F (37.3 C) 99.8 F (37.7 C) 99.6 F (37.6 C) 98.2 F (36.8 C)  TempSrc: Oral Oral Oral Oral  SpO2: 96% 94% 96% 97%  Weight:      Height:        Intake/Output Summary (Last 24 hours) at 02/13/2024 1815 Last data filed at 02/13/2024 1158 Gross per 24 hour  Intake 1159.99 ml  Output 1350 ml  Net -190.01 ml   Filed Weights   02/12/24 1319  Weight: 109.8 kg    Physical Exam  Gen:- Awake Alert, in no acute distress HEENT:- [place].AT, No sclera icterus Neck-Supple Neck,No JVD,.  Lungs-  CTAB , fair symmetrical air movement CV- S1, S2 normal, regular  Abd-  +ve B.Sounds, Abd Soft, No tenderness,    Psych-affect is appropriate, oriented x3 Neuro-chronic neuromuscular deficits consistent with multiple sclerosis  , no additional new focal deficits, no tremors MSK/Skin--   Media Information  Document Information  Photos  Lt Medial Thigh  02/13/2024 13:07  Attached To:  Hospital Encounter on 02/12/24  Source Information  Colin Dawley, MD  Ap-Dept 300  Document History     Data Reviewed: I have personally reviewed following labs and imaging studies  CBC: Recent Labs  Lab 02/12/24 1437 02/13/24 0356  WBC 12.5* 9.6  NEUTROABS 11.3*  --   HGB 13.1 12.3*  HCT 40.8 40.3  MCV 82.6 83.4  PLT 316 256   Basic Metabolic Panel: Recent Labs  Lab 02/12/24 1334 02/13/24 0356  NA 132* 137  K 3.7 3.4*  CL 98 102  CO2 26 28  GLUCOSE 104* 85  BUN 14 14  CREATININE 1.29* 1.39*  CALCIUM 9.1 8.9   GFR: Estimated Creatinine Clearance: 82.8 mL/min (A) (by C-G formula based on SCr of 1.39 mg/dL (H)). Liver Function Tests: Recent Labs  Lab 02/12/24 1334  AST 17  ALT 13  ALKPHOS 134*   BILITOT 0.4  PROT 8.6*  ALBUMIN 4.1   HbA1C: Recent Labs    02/13/24 0356  HGBA1C 6.0*    Recent Results (from the past 240 hours)  Resp panel by RT-PCR (RSV, Flu A&B, Covid) Anterior Nasal Swab  Status: None   Collection Time: 02/12/24  1:25 PM   Specimen: Anterior Nasal Swab  Result Value Ref Range Status   SARS Coronavirus 2 by RT PCR NEGATIVE NEGATIVE Final    Comment: (NOTE) SARS-CoV-2 target nucleic acids are NOT DETECTED.  The SARS-CoV-2 RNA is generally detectable in upper respiratory specimens during the acute phase of infection. The lowest concentration of SARS-CoV-2 viral copies this assay can detect is 138 copies/mL. A negative result does not preclude SARS-Cov-2 infection and should not be used as the sole basis for treatment or other patient management decisions. A negative result may occur with  improper specimen collection/handling, submission of specimen other than nasopharyngeal swab, presence of viral mutation(s) within the areas targeted by this assay, and inadequate number of viral copies(<138 copies/mL). A negative result must be combined with clinical observations, patient history, and epidemiological information. The expected result is Negative.  Fact Sheet for Patients:  BloggerCourse.com  Fact Sheet for Healthcare Providers:  SeriousBroker.it  This test is no t yet approved or cleared by the [place]  FDA and  has been authorized for detection and/or diagnosis of SARS-CoV-2 by FDA under an Emergency Use Authorization (EUA). This EUA will remain  in effect (meaning this test can be used) for the duration of the COVID-19 declaration under Section 564(b)(1) of the Act, 21 U.S.C.section 360bbb-3(b)(1), unless the authorization is terminated  or revoked sooner.       Influenza A by PCR NEGATIVE NEGATIVE Final   Influenza B by PCR NEGATIVE NEGATIVE Final    Comment: (NOTE) The Xpert  Xpress SARS-CoV-2/FLU/RSV plus assay is intended as an aid in the diagnosis of influenza from Nasopharyngeal swab specimens and should not be used as a sole basis for treatment. Nasal washings and aspirates are unacceptable for Xpert Xpress SARS-CoV-2/FLU/RSV testing.  Fact Sheet for Patients: BloggerCourse.com  Fact Sheet for Healthcare Providers: SeriousBroker.it  This test is not yet approved or cleared by the [place]  FDA and has been authorized for detection and/or diagnosis of SARS-CoV-2 by FDA under an Emergency Use Authorization (EUA). This EUA will remain in effect (meaning this test can be used) for the duration of the COVID-19 declaration under Section 564(b)(1) of the Act, 21 U.S.C. section 360bbb-3(b)(1), unless the authorization is terminated or revoked.     Resp Syncytial Virus by PCR NEGATIVE NEGATIVE Final    Comment: (NOTE) Fact Sheet for Patients: BloggerCourse.com  Fact Sheet for Healthcare Providers: SeriousBroker.it  This test is not yet approved or cleared by the [place]  FDA and has been authorized for detection and/or diagnosis of SARS-CoV-2 by FDA under an Emergency Use Authorization (EUA). This EUA will remain in effect (meaning this test can be used) for the duration of the COVID-19 declaration under Section 564(b)(1) of the Act, 21 U.S.C. section 360bbb-3(b)(1), unless the authorization is terminated or revoked.  Performed at [facility], [address] [place], [place] [address]   Blood Culture (routine x 2)     Status: None (Preliminary result)   Collection Time: 02/12/24  1:34 PM   Specimen: Left Antecubital; Blood  Result Value Ref Range Status   Specimen Description   Final    LEFT ANTECUBITAL BOTTLES DRAWN AEROBIC AND ANAEROBIC   Special Requests Blood Culture adequate volume  Final   Culture   Final    NO GROWTH < 24  HOURS Performed at [facility], [address], [place], [place] [address]    Report Status PENDING  Incomplete  Blood Culture (  routine x 2)     Status: None (Preliminary result)   Collection Time: 02/12/24  1:57 PM   Specimen: BLOOD LEFT HAND  Result Value Ref Range Status   Specimen Description BLOOD LEFT HAND AEROBIC BOTTLE ONLY  Final   Special Requests   Final    Blood Culture results may not be optimal due to an inadequate volume of blood received in culture bottles   Culture   Final    NO GROWTH < 24 HOURS Performed at [facility], [address], [place], [place] [address]    Report Status PENDING  Incomplete  MRSA Next Gen by PCR, Nasal     Status: Abnormal   Collection Time: 02/12/24  5:45 PM   Specimen: Nasal Mucosa; Nasal Swab  Result Value Ref Range Status   MRSA by PCR Next Gen DETECTED (A) NOT DETECTED Final    Comment: RESULT CALLED TO, READ BACK BY AND VERIFIED WITH: RREYNOLDS,M ON 02/12/24 AT 2131 BY PURDIE,J        The GeneXpert MRSA Assay (FDA approved for NASAL specimens only), is one component of a comprehensive MRSA colonization surveillance program. It is not intended to diagnose MRSA infection nor to guide or monitor treatment for MRSA infections. Performed at [facility], [address], [place], [place] [address]     Radiology Studies: [facility] 1 View Result Date: 02/12/2024 CLINICAL DATA:  Questionable sepsis - evaluate for abnormality Fever this morning.  COVID vaccine yesterday. EXAM: PORTABLE CHEST 1 VIEW COMPARISON:  Radiograph 01/09/2024 FINDINGS: Stable heart size and mediastinal contours. Unchanged left lung base scarring. No acute airspace disease. No pulmonary edema, large pleural effusion or pneumothorax. No acute osseous abnormalities are seen. IMPRESSION: Unchanged left lung base scarring. No acute chest findings. Electronically Signed   By: Chadwick Colonel M.D.   On: 02/12/2024 14:07   Scheduled Meds:  amitriptyline   10 mg Oral  QHS   baclofen   10 mg Oral TID   Chlorhexidine  Gluconate Cloth  6 each Topical Daily   clonazePAM   1 mg Oral BID   enoxaparin  (LOVENOX ) injection  40 mg Subcutaneous Q24H   famotidine  20 mg Oral BID   fluticasone  1 spray Each Nare Daily   hydrocortisone  cream   Topical TID   hyoscyamine   0.375 mg Oral Q12H   insulin  aspart  0-15 Units Subcutaneous TID WC   insulin  aspart  0-5 Units Subcutaneous QHS   levETIRAcetam   250 mg Oral BID   loratadine   10 mg Oral Daily   [START ON 02/14/2024] lubiprostone   8 mcg Oral Q breakfast   modafinil  200 mg Oral Daily   mupirocin ointment  1 Application Nasal BID   pantoprazole   40 mg Oral QHS   polyethylene glycol  17 g Oral BID   pravastatin   20 mg Oral Daily   pregabalin   150 mg Oral QHS   pregabalin   75 mg Oral Daily   senna  2 tablet Oral BID   Continuous Infusions:   ceFAZolin  (ANCEF ) IV 2 g (02/13/24 1423)    LOS: 1 day   Colin Dawley M.D on 02/13/2024 at 6:15 PM  Go to [email or web address] - for contact info  [facility] - Office  [phone]  If 7PM-7AM, please contact night-coverage [email or web address] 02/13/2024, 6:15 PM

## 2024-02-13 NOTE — Progress Notes (Incomplete)
   02/13/24 1307  TOC Assessment  TOC screening is complete Yes  Once discharged, how will the patient get to their discharge location? Ambulance  Expected Discharge Plan Skilled Nursing Facility  Final next level of care Skilled Nursing Facility  Patient states their goals for this hospitalization and ongoing recovery are: I will be discharge to [facility].gov Compare Post Acute Care list provided to: Patient  Choice offered to / list presented to  Patient  Living arrangements for the past 2 months Skilled Nursing Facility  Lives with: Facility Resident  Share Information with NAME Kipling Graser  Permission granted to share info w Relationship Spouse  Permission granted to share info w Contact Information [phone]  Patient language and need for interpreter reviewed: No  Need for Family Participation in Patient Care Y  Care giver support system in place? Y  Appearance: Appears stated age  Attitude/Demeanor/Rapport Engaged  Affect (typically observed) Accepting  Orientation:  Oriented to Self;Oriented to Place;Oriented to  Time;Oriented to Situation   Admitted with Systemic inflammatory response syndrome.

## 2024-02-13 NOTE — Progress Notes (Signed)
   02/13/24 1307  TOC Assessment  TOC screening is complete Yes  Once discharged, how will the patient get to their discharge location? Ambulance  Expected Discharge Plan Skilled Nursing Facility  Final next level of care Skilled Nursing Facility  Patient states their goals for this hospitalization and ongoing recovery are: I will be discharge to [facility].gov Compare Post Acute Care list provided to: Patient  Choice offered to / list presented to  Patient  Living arrangements for the past 2 months Skilled Nursing Facility  Lives with: Facility Resident  Share Information with [name]  Permission granted to share info w Relationship Spouse  Permission granted to share info w Contact Information [phone]  Patient language and need for interpreter reviewed: No  Need for Family Participation in Patient Care Y  Care giver support system in place? Y  Appearance: Appears stated age  Attitude/Demeanor/Rapport Engaged  Affect (typically observed) Accepting  Orientation:  Oriented to Self;Oriented to Place;Oriented to  Time;Oriented to Situation   Admitted with Systemic inflammatory response syndrome .Transition of Care Department [facility]) has reviewed patient.   TOC continue to monitor patient advancement through interdisciplinary progressions rounds.

## 2024-02-13 NOTE — TOC Initial Note (Signed)
 Transition of Care [facility]) - Initial/Assessment Note    Patient Details  Name: Vincent Anthony MRN: [id number] Date of Birth: 12-05-1970  Transition of Care [facility]) CM/SW Contact:    Lynda Sands, RN Phone Number: 02/13/2024, 1:30 PM  Clinical Narrative:    CM me with patient at bedside. Patient reports he is at [facility]. Patient reports he is married but has  multiple sclerosis and his spouse needs assistance to care for him.                Expected Discharge Plan: Skilled Nursing Facility   Patient Goals and CMS Choice Patient states their goals for this hospitalization and ongoing recovery are:: I will be discharge to [facility].gov Compare Post Acute Care list provided to:: Patient Choice offered to / list presented to : Patient      Expected Discharge Plan and Services    Discharge to [facility] arrangements for the past 2 months: Skilled Nursing Facility                      Prior Living Arrangements/Services Living arrangements for the past 2 months: Skilled Nursing Facility Lives with:: Facility Resident Patient language and need for interpreter reviewed:: No        Need for Family Participation in Patient Care: Yes (Comment) Care giver support system in place?: Yes (comment)      Activities of Daily Living    Needs assistance  Permission Sought/Granted      Share Information with NAME: Remy Voiles     Permission granted to share info w Relationship: Spouse  Permission granted to share info w Contact Information: [phone]  Emotional Assessment Appearance:: Appears stated age Attitude/Demeanor/Rapport: Engaged Affect (typically observed): Accepting Orientation: : Oriented to Self, Oriented to Place, Oriented to  Time, Oriented to Situation      Admission diagnosis:  SIRS (systemic inflammatory response syndrome) (HCC) [R65.10] Sepsis without acute organ dysfunction, due to unspecified organism [facility])  [A41.9] Patient Active Problem List   Diagnosis Date Noted   SIRS (systemic inflammatory response syndrome) (HCC) 02/12/2024   Cellulitis of left thigh 02/12/2024   Diabetes mellitus without complication (HCC)    Neuromuscular disorder (HCC)    GERD (gastroesophageal reflux disease)    Depression    Nonspecific abnormal electrocardiogram (ECG) (EKG) 10/28/2023   Presence of urogenital implant 09/15/2023   Constipation due to neurogenic bowel 09/15/2023   Seizures (HCC) 11/18/2020   Hyponatremia 11/18/2020   MS (multiple sclerosis) (HCC) 03/08/2018   Absent testis, 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 [place] [address] Phone: [phone] Fax: [phone]   Social Drivers of Health (SDOH) Social History: SDOH Screenings   Food Insecurity: No Food Insecurity (02/12/2024)  Housing: Low Risk  (02/12/2024)  Transportation Needs: No Transportation Needs (02/12/2024)  Utilities: Not At Risk (02/12/2024)  Tobacco Use: Low Risk  (02/12/2024)  SDOH Interventions:     Readmission Risk Interventions     No data to display

## 2024-02-14 ENCOUNTER — Inpatient Hospital Stay (HOSPITAL_COMMUNITY)

## 2024-02-14 DIAGNOSIS — A419 Sepsis, unspecified organism: Secondary | ICD-10-CM | POA: Diagnosis not present

## 2024-02-14 DIAGNOSIS — R6510 Systemic inflammatory response syndrome (SIRS) of non-infectious origin without acute organ dysfunction: Secondary | ICD-10-CM | POA: Diagnosis not present

## 2024-02-14 DIAGNOSIS — R509 Fever, unspecified: Secondary | ICD-10-CM | POA: Diagnosis not present

## 2024-02-14 LAB — GLUCOSE, CAPILLARY
Glucose-Capillary: 141 mg/dL — ABNORMAL HIGH (ref 70–99)
Glucose-Capillary: 113 mg/dL — ABNORMAL HIGH (ref 70–99)
Glucose-Capillary: 105 mg/dL — ABNORMAL HIGH (ref 70–99)
Glucose-Capillary: 138 mg/dL — ABNORMAL HIGH (ref 70–99)

## 2024-02-14 NOTE — Progress Notes (Signed)
 Nurse at the bedside,patient alert and oriented times four.No c/o pain or discomfort noted. Plan of care on going.

## 2024-02-14 NOTE — TOC Progression Note (Signed)
 Transition of Care [facility]) - Progression Note    Patient Details  Name: Vincent Anthony MRN: [id number] Date of Birth: [birth year]  Transition of Care [facility]) CM/SW Contact  Ander [place], [place] Phone Number: 02/14/2024, 10:13 AM  Clinical Narrative: LCSW confirmed with Debbie at [facility] that pt is long term resident.       Expected Discharge Plan: Skilled Nursing Facility Barriers to Discharge: Continued Medical Work up  Expected Discharge Plan and Services       Living arrangements for the past 2 months: Skilled Nursing Facility                                       Social Determinants of Health (SDOH) Interventions SDOH Screenings   Food Insecurity: No Food Insecurity (02/12/2024)  Housing: Low Risk  (02/12/2024)  Transportation Needs: No Transportation Needs (02/12/2024)  Utilities: Not At Risk (02/12/2024)  Tobacco Use: Low Risk  (02/12/2024)    Readmission Risk Interventions     No data to display

## 2024-02-14 NOTE — Plan of Care (Signed)
  Problem: Health Behavior/Discharge Planning: Goal: Ability to manage health-related needs will improve Outcome: Progressing   Problem: Clinical Measurements: Goal: Ability to maintain clinical measurements within normal limits will improve Outcome: Progressing Goal: Will remain free from infection Outcome: Progressing Goal: Diagnostic test results will improve Outcome: Progressing   Problem: Activity: Goal: Risk for activity intolerance will decrease Outcome: Progressing   Problem: Nutrition: Goal: Adequate nutrition will be maintained Outcome: Progressing   Problem: Elimination: Goal: Will not experience complications related to bowel motility Outcome: Progressing Goal: Will not experience complications related to urinary retention Outcome: Progressing   Problem: Safety: Goal: Ability to remain free from injury will improve Outcome: Progressing   Problem: Skin Integrity: Goal: Risk for impaired skin integrity will decrease Outcome: Progressing   Problem: Clinical Measurements: Goal: Ability to avoid or minimize complications of infection will improve Outcome: Progressing   Problem: Skin Integrity: Goal: Skin integrity will improve Outcome: Progressing   Problem: Health Behavior/Discharge Planning: Goal: Ability to manage health-related needs will improve Outcome: Progressing   Problem: Skin Integrity: Goal: Risk for impaired skin integrity will decrease Outcome: Progressing   Problem: Tissue Perfusion: Goal: Adequacy of tissue perfusion will improve Outcome: Progressing

## 2024-02-14 NOTE — Progress Notes (Addendum)
 PROGRESS NOTE  Vincent Anthony, is a 53 y.o. male, DOB - Nov 25, 1970, UJW:[id number]  Admit date - 02/12/2024   Admitting Physician Malka Sea, DO  Outpatient Primary MD for the patient is System, Provider Not In  LOS - 2  Chief Complaint  Patient presents with   Fever       Brief Narrative:   53 y.o. male with medical history significant of multiple sclerosis with neurogenic bladder and neurogenic bowel, type 2 diabetes, history of elevated LFTs, GERD.  Admitted on 02/12/2024 from [facility] with concerns for sepsis due to left thigh cellulitis    -Assessment and Plan: 1)Sepsis due to Lt Thigh Cellulitis---POA -Sepsis criteria on admission with fevers over 103, tachycardia into the 110s, tachypnea RR 22, and leukocytosis WBC 12.5 >>9.6 - No further fevers, overall sepsis pathophysiology appears to be resolving -Left Thigh soft tissue ultrasound to exclude abscess is still pending--- - Continue IV Ancef   2)Multiple sclerosis--with neuromuscular deficits and neurogenic bowel and neurogenic bladder -- Continue supportive care -Patient is voiding okay --No BM yet - Continue laxatives and Amitiza  for constipation - Continue Elavil , baclofen  and Lyrica  for MS related neuromuscular issues  3)Seizure disorder--continue Keppra   4)GERD--- continue PPI  5)DM2-A1c 6.0 reflecting excellent diabetic control PTA  --continue to hold metformin Use Novolog /Humalog Sliding scale insulin  with Accu-Cheks/Fingersticks as ordered   6)AKI----acute kidney injury    creatinine on admission= 1.29  ,  baseline creatinine = 1.0 in January 2025    ,  -creatinine is now= up to 1.39, - renally adjust medications, avoid nephrotoxic agents / dehydration  / hypotension  7)Hypokalemia--- replace and recheck  Status is: Inpatient   Disposition: The patient is from: SNF              Anticipated d/c is to: SNF              Anticipated d/c date is: 1 day              Patient currently is  not medically stable to d/c. Barriers: Not Clinically Stable-   Code Status :  -  Code Status: Full Code   Family Communication:    NA (patient is alert, awake and coherent)   DVT Prophylaxis  :   - SCDs  enoxaparin  (LOVENOX ) injection 40 mg Start: 02/12/24 2200   Lab Results  Component Value Date   PLT 256 02/13/2024    Inpatient Medications  Scheduled Meds:  amitriptyline   10 mg Oral QHS   baclofen   10 mg Oral TID   Chlorhexidine  Gluconate Cloth  6 each Topical Daily   clonazePAM   1 mg Oral BID   enoxaparin  (LOVENOX ) injection  40 mg Subcutaneous Q24H   famotidine   20 mg Oral BID   fluticasone   1 spray Each Nare Daily   hydrocortisone  cream   Topical TID   hyoscyamine   0.375 mg Oral Q12H   insulin  aspart  0-15 Units Subcutaneous TID WC   insulin  aspart  0-5 Units Subcutaneous QHS   levETIRAcetam   250 mg Oral BID   loratadine   10 mg Oral Daily   lubiprostone   8 mcg Oral Q breakfast   mupirocin  ointment  1 Application Nasal BID   pantoprazole   40 mg Oral QHS   polyethylene glycol  17 g Oral BID   pravastatin   20 mg Oral Daily   pregabalin   150 mg Oral QHS   pregabalin   75 mg Oral Daily   senna  2 tablet Oral  BID   Continuous Infusions:   ceFAZolin  (ANCEF ) IV 2 g (02/14/24 0501)   PRN Meds:.artificial tears, ondansetron    Anti-infectives (From admission, onward)    Start     Dose/Rate Route Frequency Ordered Stop   02/12/24 2200  ceFAZolin  (ANCEF ) IVPB 2g/100 mL premix        2 g 200 mL/hr over 30 Minutes Intravenous Every 8 hours 02/12/24 1746 02/19/24 2159   02/12/24 1600  ceFEPIme  (MAXIPIME ) 2 g in sodium chloride  0.9 % 100 mL IVPB        2 g 200 mL/hr over 30 Minutes Intravenous  Once 02/12/24 1555 02/12/24 1653   02/12/24 1600  metroNIDAZOLE  (FLAGYL ) IVPB 500 mg  Status:  Discontinued        500 mg 100 mL/hr over 60 Minutes Intravenous  Once 02/12/24 1555 02/12/24 2101   02/12/24 1600  vancomycin  (VANCOCIN ) IVPB 1000 mg/200 mL premix        1,000 mg 200  mL/hr over 60 Minutes Intravenous  Once 02/12/24 1555 02/12/24 1903        Subjective: Vincent Anthony today has no further fevers, no emesis,  No chest pain,   - --Complains of generalized weakness and fatigue - Still no BM  Objective: Vitals:   02/13/24 2025 02/13/24 2043 02/14/24 0400 02/14/24 0856  BP: 113/67 (!) 129/92 (P) 108/76 111/73  Pulse: 88 76 82 90  Resp: 18 16 18 18   Temp: 98.1 F (36.7 C) 98 F (36.7 C) 98.7 F (37.1 C) 98.8 F (37.1 C)  TempSrc: Oral Oral Oral Oral  SpO2: 99% 100% 95% 95%  Weight:      Height:        Intake/Output Summary (Last 24 hours) at 02/14/2024 1357 Last data filed at 02/14/2024 1041 Gross per 24 hour  Intake 240 ml  Output 1225 ml  Net -985 ml   Filed Weights   02/12/24 1319  Weight: 109.8 kg    Physical Exam  Gen:- Awake Alert, in no acute distress HEENT:- [place].AT, No sclera icterus Neck-Supple Neck,No JVD,.  Lungs-  CTAB , fair symmetrical air movement CV- S1, S2 normal, regular  Abd-  +ve B.Sounds, Abd Soft, No tenderness,    Psych-affect is appropriate, oriented x3 Neuro-chronic neuromuscular deficits consistent with multiple sclerosis  , no additional new focal deficits, no tremors MSK/Skin--left erythema, warmth, tenderness and swelling   Media Information  Document Information  Photos  Lt Medial Thigh  02/13/2024 13:07  Attached To:  Hospital Encounter on 02/12/24  Source Information  Colin Dawley, MD  Ap-Dept 300  Document History     Data Reviewed: I have personally reviewed following labs and imaging studies  CBC: Recent Labs  Lab 02/12/24 1437 02/13/24 0356  WBC 12.5* 9.6  NEUTROABS 11.3*  --   HGB 13.1 12.3*  HCT 40.8 40.3  MCV 82.6 83.4  PLT 316 256   Basic Metabolic Panel: Recent Labs  Lab 02/12/24 1334 02/13/24 0356  NA 132* 137  K 3.7 3.4*  CL 98 102  CO2 26 28  GLUCOSE 104* 85  BUN 14 14  CREATININE 1.29* 1.39*  CALCIUM 9.1 8.9   GFR: Estimated Creatinine  Clearance: 82.8 mL/min (A) (by C-G formula based on SCr of 1.39 mg/dL (H)). Liver Function Tests: Recent Labs  Lab 02/12/24 1334  AST 17  ALT 13  ALKPHOS 134*  BILITOT 0.4  PROT 8.6*  ALBUMIN 4.1   HbA1C: Recent Labs    02/13/24 0356  HGBA1C 6.0*  Recent Results (from the past 240 hours)  Resp panel by RT-PCR (RSV, Flu A&B, Covid) Anterior Nasal Swab     Status: None   Collection Time: 02/12/24  1:25 PM   Specimen: Anterior Nasal Swab  Result Value Ref Range Status   SARS Coronavirus 2 by RT PCR NEGATIVE NEGATIVE Final    Comment: (NOTE) SARS-CoV-2 target nucleic acids are NOT DETECTED.  The SARS-CoV-2 RNA is generally detectable in upper respiratory specimens during the acute phase of infection. The lowest concentration of SARS-CoV-2 viral copies this assay can detect is 138 copies/mL. A negative result does not preclude SARS-Cov-2 infection and should not be used as the sole basis for treatment or other patient management decisions. A negative result may occur with  improper specimen collection/handling, submission of specimen other than nasopharyngeal swab, presence of viral mutation(s) within the areas targeted by this assay, and inadequate number of viral copies(<138 copies/mL). A negative result must be combined with clinical observations, patient history, and epidemiological information. The expected result is Negative.  Fact Sheet for Patients:  BloggerCourse.com  Fact Sheet for Healthcare Providers:  SeriousBroker.it  This test is no t yet approved or cleared by the [place]  FDA and  has been authorized for detection and/or diagnosis of SARS-CoV-2 by FDA under an Emergency Use Authorization (EUA). This EUA will remain  in effect (meaning this test can be used) for the duration of the COVID-19 declaration under Section 564(b)(1) of the Act, 21 U.S.C.section 360bbb-3(b)(1), unless the authorization is  terminated  or revoked sooner.       Influenza A by PCR NEGATIVE NEGATIVE Final   Influenza B by PCR NEGATIVE NEGATIVE Final    Comment: (NOTE) The Xpert Xpress SARS-CoV-2/FLU/RSV plus assay is intended as an aid in the diagnosis of influenza from Nasopharyngeal swab specimens and should not be used as a sole basis for treatment. Nasal washings and aspirates are unacceptable for Xpert Xpress SARS-CoV-2/FLU/RSV testing.  Fact Sheet for Patients: BloggerCourse.com  Fact Sheet for Healthcare Providers: SeriousBroker.it  This test is not yet approved or cleared by the [place]  FDA and has been authorized for detection and/or diagnosis of SARS-CoV-2 by FDA under an Emergency Use Authorization (EUA). This EUA will remain in effect (meaning this test can be used) for the duration of the COVID-19 declaration under Section 564(b)(1) of the Act, 21 U.S.C. section 360bbb-3(b)(1), unless the authorization is terminated or revoked.     Resp Syncytial Virus by PCR NEGATIVE NEGATIVE Final    Comment: (NOTE) Fact Sheet for Patients: BloggerCourse.com  Fact Sheet for Healthcare Providers: SeriousBroker.it  This test is not yet approved or cleared by the [place]  FDA and has been authorized for detection and/or diagnosis of SARS-CoV-2 by FDA under an Emergency Use Authorization (EUA). This EUA will remain in effect (meaning this test can be used) for the duration of the COVID-19 declaration under Section 564(b)(1) of the Act, 21 U.S.C. section 360bbb-3(b)(1), unless the authorization is terminated or revoked.  Performed at [facility], [address] [place], [place] [address]   Blood Culture (routine x 2)     Status: None (Preliminary result)   Collection Time: 02/12/24  1:34 PM   Specimen: Left Antecubital; Blood  Result Value Ref Range Status   Specimen Description   Final     LEFT ANTECUBITAL BOTTLES DRAWN AEROBIC AND ANAEROBIC   Special Requests Blood Culture adequate volume  Final   Culture   Final    NO GROWTH 2 DAYS  Performed at [facility], [address] [place], [place] [address]    Report Status PENDING  Incomplete  Blood Culture (routine x 2)     Status: None (Preliminary result)   Collection Time: 02/12/24  1:57 PM   Specimen: BLOOD LEFT HAND  Result Value Ref Range Status   Specimen Description BLOOD LEFT HAND AEROBIC BOTTLE ONLY  Final   Special Requests   Final    Blood Culture results may not be optimal due to an inadequate volume of blood received in culture bottles   Culture   Final    NO GROWTH 2 DAYS Performed at [facility], [address] [place], [place] [address]    Report Status PENDING  Incomplete  MRSA Next Gen by PCR, Nasal     Status: Abnormal   Collection Time: 02/12/24  5:45 PM   Specimen: Nasal Mucosa; Nasal Swab  Result Value Ref Range Status   MRSA by PCR Next Gen DETECTED (A) NOT DETECTED Final    Comment: RESULT CALLED TO, READ BACK BY AND VERIFIED WITH: RREYNOLDS,M ON 02/12/24 AT 2131 BY PURDIE,J        The GeneXpert MRSA Assay (FDA approved for NASAL specimens only), is one component of a comprehensive MRSA colonization surveillance program. It is not intended to diagnose MRSA infection nor to guide or monitor treatment for MRSA infections. Performed at [facility], [address] [place], [place] [address]     Scheduled Meds:  amitriptyline   10 mg Oral QHS   baclofen   10 mg Oral TID   Chlorhexidine  Gluconate Cloth  6 each Topical Daily   clonazePAM   1 mg Oral BID   enoxaparin  (LOVENOX ) injection  40 mg Subcutaneous Q24H   famotidine   20 mg Oral BID   fluticasone   1 spray Each Nare Daily   hydrocortisone  cream   Topical TID   hyoscyamine   0.375 mg Oral Q12H   insulin  aspart  0-15 Units Subcutaneous TID WC   insulin  aspart  0-5 Units Subcutaneous QHS   levETIRAcetam   250 mg Oral BID   loratadine    10 mg Oral Daily   lubiprostone   8 mcg Oral Q breakfast   mupirocin  ointment  1 Application Nasal BID   pantoprazole   40 mg Oral QHS   polyethylene glycol  17 g Oral BID   pravastatin   20 mg Oral Daily   pregabalin   150 mg Oral QHS   pregabalin   75 mg Oral Daily   senna  2 tablet Oral BID   Continuous Infusions:   ceFAZolin  (ANCEF ) IV 2 g (02/14/24 0501)    LOS: 2 days   Colin Dawley M.D on 02/14/2024 at 1:57 PM  Go to [email or web address] - for contact info  [facility] - Office  [phone]  If 7PM-7AM, please contact night-coverage [email or web address] 02/14/2024, 1:57 PM

## 2024-02-14 NOTE — Plan of Care (Signed)
  Problem: Nutrition: Goal: Adequate nutrition will be maintained Outcome: Progressing   Problem: Nutrition: Goal: Adequate nutrition will be maintained Outcome: Progressing   

## 2024-02-14 NOTE — NC FL2 (Signed)
 [place]  MEDICAID FL2 LEVEL OF CARE FORM     IDENTIFICATION  Patient Name: Vincent Anthony Birthdate: 15-Feb-1971 Sex: male Admission Date (Current Location): 02/12/2024  [facility] and [place] Number:  [employer] and Address:  [facility],  [address] [place] [address]      Provider Number: [id number]  Attending Physician Name and Address:  Colin Dawley, MD  Relative Name and Phone Number:       Current Level of Care: Hospital Recommended Level of Care: Skilled Nursing Facility Prior Approval Number:    Date Approved/Denied:   PASRR Number:    Discharge Plan: SNF    Current Diagnoses: Patient Active Problem List   Diagnosis Date Noted   SIRS (systemic inflammatory response syndrome) (HCC) 02/12/2024   Cellulitis of left thigh 02/12/2024   Diabetes mellitus without complication (HCC)    Neuromuscular disorder (HCC)    GERD (gastroesophageal reflux disease)    Depression    Nonspecific abnormal electrocardiogram (ECG) (EKG) 10/28/2023   Presence of urogenital implant 09/15/2023   Constipation due to neurogenic bowel 09/15/2023   Seizures (HCC) 11/18/2020   Hyponatremia 11/18/2020   MS (multiple sclerosis) (HCC) 03/08/2018   Absent testis, 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/03/2018   History of urinary stone 02/03/2018   Urge incontinence of urine 02/03/2018   Elevated LFTs 11/05/2016   Neuropathic pain 05/23/2015   Arthralgia of both knees 05/23/2015   Chronic pain syndrome 03/20/2014   Cryptorchidism, unilateral 05/16/2013   Bilateral knee pain 01/29/2013   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   Ongoing use of possibly toxic medication 10/11/2012   Acne 01/13/2012   Neurogenic bladder 11/13/2011   Urinary urgency 11/13/2011    Orientation RESPIRATION BLADDER Height & Weight     Self, Time, Situation, Place  Normal External catheter Weight: 242 lb (109.8 kg) Height:  6' 3.5" (191.8 cm)  BEHAVIORAL SYMPTOMS/MOOD NEUROLOGICAL BOWEL NUTRITION  STATUS      Incontinent Diet (See d/c summary)  AMBULATORY STATUS COMMUNICATION OF NEEDS Skin   Extensive Assist Verbally Normal                       Personal Care Assistance Level of Assistance  Bathing, Feeding, Dressing Bathing Assistance: Maximum assistance Feeding assistance: Limited assistance Dressing Assistance: Maximum assistance     Functional Limitations Info  Sight, Speech, Hearing Sight Info: Adequate Hearing Info: Adequate Speech Info: Adequate    SPECIAL CARE FACTORS FREQUENCY                       Contractures      Additional Factors Info  Code Status, Allergies, Psychotropic Code Status Info: Full Allergies Info: Tizanidine  Celebrex (Celecoxib)  Clindamycin  Vioxx (Rofecoxib)  Zanaflex (Tizanidine Hcl) Psychotropic Info: Klonopin          Current Medications (02/14/2024):  This is the current hospital active medication list Current Facility-Administered Medications  Medication Dose Route Frequency Provider Last Rate Last Admin   amitriptyline  (ELAVIL ) tablet 10 mg  10 mg Oral QHS Stinson, Jacob J, DO   10 mg at 02/13/24 2227   artificial tears (LACRILUBE) ophthalmic ointment   Both Eyes Q4H PRN Stinson, Jacob J, DO       baclofen  (LIORESAL ) tablet 10 mg  10 mg Oral TID Stinson, Jacob J, DO   10 mg at 02/13/24 2227   ceFAZolin  (ANCEF ) IVPB 2g/100 mL premix  2 g Intravenous Q8H Stinson, Jacob J, DO 200 mL/hr at  02/14/24 0501 2 g at 02/14/24 0501   Chlorhexidine  Gluconate Cloth 2 % PADS 6 each  6 each Topical Daily Adefeso, Oladapo, DO   6 each at 02/13/24 1107   clonazePAM  (KLONOPIN ) tablet 1 mg  1 mg Oral BID Stinson, Jacob J, DO   1 mg at 02/13/24 2226   enoxaparin  (LOVENOX ) injection 40 mg  40 mg Subcutaneous Q24H Stinson, Jacob J, DO   40 mg at 02/13/24 2227   famotidine  (PEPCID ) tablet 20 mg  20 mg Oral BID Stinson, Jacob J, DO   20 mg at 02/13/24 2226   fluticasone  (FLONASE ) 50 MCG/ACT nasal spray 1 spray  1 spray Each Nare Daily Stinson,  Jacob J, DO   1 spray at 02/13/24 1610   hydrocortisone  cream 1 %   Topical TID Stinson, Jacob J, DO   Given at 02/13/24 2227   hyoscyamine  (LEVBID) 0.375 MG 12 hr tablet 0.375 mg  0.375 mg Oral Q12H Stinson, Jacob J, DO   0.375 mg at 02/13/24 2241   insulin  aspart (novoLOG ) injection 0-15 Units  0-15 Units Subcutaneous TID WC Stinson, Jacob J, DO       insulin  aspart (novoLOG ) injection 0-5 Units  0-5 Units Subcutaneous QHS Stinson, Jacob J, DO       levETIRAcetam  (KEPPRA ) tablet 250 mg  250 mg Oral BID Stinson, Jacob J, DO   250 mg at 02/13/24 2227   loratadine  (CLARITIN ) tablet 10 mg  10 mg Oral Daily Stinson, Jacob J, DO   10 mg at 02/13/24 0840   lubiprostone  (AMITIZA ) capsule 8 mcg  8 mcg Oral Q breakfast Stinson, Jacob J, DO       modafinil  (PROVIGIL ) tablet 200 mg  200 mg Oral Daily Stinson, Jacob J, DO       mupirocin  ointment (BACTROBAN ) 2 % 1 Application  1 Application Nasal BID Adefeso, Oladapo, DO   1 Application at 02/13/24 2227   ondansetron  (ZOFRAN ) tablet 4 mg  4 mg Oral Q8H PRN Stinson, Jacob J, DO       pantoprazole  (PROTONIX ) EC tablet 40 mg  40 mg Oral QHS Stinson, Jacob J, DO   40 mg at 02/13/24 2227   polyethylene glycol (MIRALAX  / GLYCOLAX ) packet 17 g  17 g Oral BID Stinson, Jacob J, DO   17 g at 02/13/24 9604   pravastatin  (PRAVACHOL ) tablet 20 mg  20 mg Oral Daily Stinson, Jacob J, DO   20 mg at 02/13/24 0840   pregabalin  (LYRICA ) capsule 150 mg  150 mg Oral QHS Stinson, Jacob J, DO   150 mg at 02/13/24 2226   pregabalin  (LYRICA ) capsule 75 mg  75 mg Oral Daily Stinson, Jacob J, DO   75 mg at 02/13/24 5409   senna (SENOKOT) tablet 17.2 mg  2 tablet Oral BID Stinson, Jacob J, DO   17.2 mg at 02/13/24 2226     Discharge Medications: Please see discharge summary for a list of discharge medications.  Relevant Imaging Results:  Relevant Lab Results:   Additional Information    Ander Katos, LCSW

## 2024-02-15 DIAGNOSIS — R569 Unspecified convulsions: Secondary | ICD-10-CM

## 2024-02-15 DIAGNOSIS — R6510 Systemic inflammatory response syndrome (SIRS) of non-infectious origin without acute organ dysfunction: Secondary | ICD-10-CM | POA: Diagnosis not present

## 2024-02-15 DIAGNOSIS — G35 Multiple sclerosis: Secondary | ICD-10-CM

## 2024-02-15 DIAGNOSIS — F32A Depression, unspecified: Secondary | ICD-10-CM

## 2024-02-15 DIAGNOSIS — K592 Neurogenic bowel, not elsewhere classified: Secondary | ICD-10-CM

## 2024-02-15 DIAGNOSIS — R509 Fever, unspecified: Secondary | ICD-10-CM | POA: Diagnosis not present

## 2024-02-15 DIAGNOSIS — A419 Sepsis, unspecified organism: Secondary | ICD-10-CM | POA: Diagnosis not present

## 2024-02-15 DIAGNOSIS — E119 Type 2 diabetes mellitus without complications: Secondary | ICD-10-CM

## 2024-02-15 DIAGNOSIS — K219 Gastro-esophageal reflux disease without esophagitis: Secondary | ICD-10-CM

## 2024-02-15 DIAGNOSIS — G709 Myoneural disorder, unspecified: Secondary | ICD-10-CM

## 2024-02-15 LAB — GLUCOSE, CAPILLARY
Glucose-Capillary: 108 mg/dL — ABNORMAL HIGH (ref 70–99)
Glucose-Capillary: 126 mg/dL — ABNORMAL HIGH (ref 70–99)

## 2024-02-15 LAB — BASIC METABOLIC PANEL WITH GFR
Anion gap: 7 (ref 5–15)
BUN: 14 mg/dL (ref 6–20)
CO2: 27 mmol/L (ref 22–32)
Calcium: 9 mg/dL (ref 8.9–10.3)
Chloride: 102 mmol/L (ref 98–111)
Creatinine, Ser: 1.14 mg/dL (ref 0.61–1.24)
GFR, Estimated: >60 mL/min (ref >60)
Glucose, Bld: 105 mg/dL — ABNORMAL HIGH (ref 70–99)
Potassium: 4 mmol/L (ref 3.5–5.1)
Sodium: 136 mmol/L (ref 135–145)

## 2024-02-15 MED ORDER — CLONAZEPAM 1 MG PO TABS
1.0000 mg | ORAL_TABLET | Freq: Two times a day (BID) | ORAL | 0 refills | Status: AC
Start: 2024-02-15 — End: ?

## 2024-02-15 MED ORDER — DOXYCYCLINE HYCLATE 100 MG PO TABS: 100.0000 mg | ORAL_TABLET | Freq: Two times a day (BID) | ORAL | 0 refills | Status: AC

## 2024-02-15 MED ORDER — CEPHALEXIN 500 MG PO CAPS: 500.0000 mg | ORAL_CAPSULE | Freq: Three times a day (TID) | ORAL | 0 refills | Status: AC

## 2024-02-15 MED ORDER — PREGABALIN 75 MG PO CAPS: 75.0000 mg | ORAL_CAPSULE | Freq: Every day | ORAL | 0 refills | Status: AC

## 2024-02-15 MED ORDER — PREGABALIN 150 MG PO CAPS
150.0000 mg | ORAL_CAPSULE | Freq: Every day | ORAL | 0 refills | Status: AC
Start: 2024-02-15 — End: ?

## 2024-02-15 NOTE — Progress Notes (Signed)
 Patient discharged to [facility] skilled Nursing facility,report called and given to [place] E,LPN.IV discontinued ,catheter intact. [facility] to transport patient to awaiting facility.

## 2024-02-15 NOTE — Discharge Summary (Signed)
Vincent Anthony, is a 53 y.o. male  DOB 08-Nov-1970  MRN [id number].  Admission date:  02/12/2024  Admitting Physician  Malka Sea, DO  Discharge Date:  02/15/2024   Primary MD  System, Provider Not In  Recommendations for primary care physician for things to follow:  1) repeat CBC and BMP blood test in 7 to 10 days  Admission Diagnosis  SIRS (systemic inflammatory response syndrome) (HCC) [R65.10] Sepsis without acute organ dysfunction, due to unspecified organism [facility]) [A41.9]   Discharge Diagnosis  SIRS (systemic inflammatory response syndrome) (HCC) [R65.10] Sepsis without acute organ dysfunction, due to unspecified organism (HCC) [A41.9]    Principal Problem:   SIRS (systemic inflammatory response syndrome) (HCC) Active Problems:   MS (multiple sclerosis) (HCC)   Seizures (HCC)   Constipation due to neurogenic bowel   Diabetes mellitus without complication (HCC)   Neuromuscular disorder (HCC)   GERD (gastroesophageal reflux disease)   Depression   Cellulitis of left thigh      Past Medical History:  Diagnosis Date   Arthritis    Depression    Diabetes mellitus without complication (HCC)    Elevated LFTs    Elevated liver enzymes    GERD (gastroesophageal reflux disease)    Headache    MS (multiple sclerosis) (HCC)    Neurogenic bladder    Neuromuscular disorder (HCC)    Tremor     Past Surgical History:  Procedure Laterality Date   COLONOSCOPY  2014   COLONOSCOPY WITH PROPOFOL  N/A 05/01/2022   Procedure: COLONOSCOPY WITH PROPOFOL ;  Surgeon: Suzette Espy, MD;  [facility];  Service: Endoscopy;  Laterality: N/A;  9:15am   FLEXIBLE SIGMOIDOSCOPY N/A 08/11/2023   Procedure: FLEXIBLE SIGMOIDOSCOPY;  Surgeon: Suzette Espy, MD;  Location: [facility];  Service: Endoscopy;  Laterality: N/A;  11:00am, asa 3 (at [facility])   HERNIA REPAIR     KNEE SURGERY      TESTICLE REMOVAL         HPI  from the history and physical done on the day of admission:  HPI: Vincent Anthony is a 53 y.o. male with medical history significant of multiple sclerosis with neurogenic bladder and neurogenic bowel, type 2 diabetes, history of elevated LFTs, GERD.  Patient provides the history.  He was at [facility] and got a COVID vaccination yesterday.  Today, he had a fever of 103 and was brought to the hospital by EMS.  He denies abdominal pain, cough, wheezing, chest pain he does note that he has been having a rash on his thigh.  He has a lot of itching and has scratched at the area.  Denies diarrhea, nausea, vomiting.  His appetite is otherwise pretty good.   Review of Systems: As mentioned in the history of present illness. All other systems reviewed and are negative.     Hospital Course:   Brief Narrative:   53 y.o. male with medical history significant of multiple sclerosis with neurogenic bladder and neurogenic bowel, type 2  diabetes, history of elevated LFTs, GERD.  Admitted on 02/12/2024 from [facility] with concerns for sepsis due to left thigh cellulitis     -Assessment and Plan: 1)Sepsis due to Lt Thigh Cellulitis---POA -He Met Sepsis criteria on admission with fevers over 103, tachycardia into the 110s, tachypnea RR 22, and leukocytosis WBC 12.5 >>9.6 - No further fevers,  --overall sepsis pathophysiology has resolved -Left Thigh soft tissue ultrasound without abscess - -treated with IV Ancef  Okay to discharge on p.o. doxycycline and Keflex    2)Multiple sclerosis--with neuromuscular deficits and neurogenic bowel and neurogenic bladder -- Continue supportive care -Patient is voiding okay -Had BM - Continue laxatives and Amitiza  for constipation - Continue Elavil , baclofen  and Lyrica  for MS related neuromuscular issues   3)Seizure disorder--continue Keppra    4)GERD--- continue protonix    5)DM2-A1c 6.0 reflecting excellent diabetic  control PTA  --continue metformin   6)AKI----acute kidney injury    creatinine on admission= 1.29  , creatinine peaked at 1.39 baseline creatinine = 1.0 in January 2025    ,  -Has Now creatinine normalized with hydration - renally adjust medications, avoid nephrotoxic agents / dehydration  / hypotension   7)Hypokalemia--- normalized after replacement  Disposition: The patient is from: SNF              Anticipated d/c is to: SNF  Discharge Condition: stable   Follow UP with PCP  Diet and Activity recommendation:  As advised  Discharge Instructions    Discharge Instructions     Call MD for:  difficulty breathing, headache or visual disturbances   Complete by: As directed    Call MD for:  persistant dizziness or light-headedness   Complete by: As directed    Call MD for:  persistant nausea and vomiting   Complete by: As directed    Call MD for:  temperature >100.4   Complete by: As directed    Diet - low sodium heart healthy   Complete by: As directed    Discharge instructions   Complete by: As directed    1) repeat CBC and BMP blood test in 7 to 10 days   Increase activity slowly   Complete by: As directed         Discharge Medications     Allergies as of 02/15/2024       Reactions   Tizanidine Nausea Only   Celebrex [celecoxib] Nausea And Vomiting   Clindamycin Diarrhea   Vioxx [rofecoxib] Nausea And Vomiting   Zanaflex [tizanidine Hcl] Nausea And Vomiting        Medication List     TAKE these medications    amitriptyline  10 MG tablet Commonly known as: ELAVIL  Take 1 tablet (10 mg total) by mouth at bedtime. Hold until linezolid  course has finished What changed: additional instructions   artificial tears Oint ophthalmic ointment Commonly known as: LACRILUBE Place into both eyes every 4 (four) hours as needed for dry eyes.   baclofen  10 MG tablet Commonly known as: LIORESAL  Take 1 tablet by mouth 3 (three) times daily.   bisacodyl  5 MG EC  tablet Commonly known as: bisacodyl  As directed   cephALEXin  500 MG capsule Commonly known as: KEFLEX  Take 1 capsule (500 mg total) by mouth 3 (three) times daily for 5 days. What changed: when to take this   cetirizine 10 MG tablet Commonly known as: ZYRTEC Take 10 mg by mouth daily.   clonazePAM  1 MG tablet Commonly known as: KLONOPIN  Take 1 tablet (1 mg total)  by mouth 2 (two) times daily.   doxycycline 100 MG tablet Commonly known as: VIBRA-TABS Take 1 tablet (100 mg total) by mouth 2 (two) times daily for 5 days.   eucerin lotion Apply 1 Application topically every 12 (twelve) hours as needed for dry skin.   famotidine  20 MG tablet Commonly known as: PEPCID  Take 20 mg by mouth 2 (two) times daily.   fluticasone  50 MCG/ACT nasal spray Commonly known as: FLONASE  Place 1 spray into both nostrils daily.   hydrocortisone  cream 1 % Apply to affected area (ears) 2-4 times daily   hyoscyamine  0.375 MG 12 hr tablet Commonly known as: LEVBID TAKE (1) TABLET BYMOUTH EVERY TWELVE HOURS.   levETIRAcetam  250 MG tablet Commonly known as: KEPPRA  Take 250 mg by mouth 2 (two) times daily.   Linzess  145 MCG Caps capsule Generic drug: linaclotide  Take 145 mcg by mouth daily.   lubiprostone  8 MCG capsule Commonly known as: AMITIZA  Take by mouth.   metFORMIN 500 MG tablet Commonly known as: GLUCOPHAGE Take 500 mg by mouth 2 (two) times daily.   modafinil  200 MG tablet Commonly known as: PROVIGIL  Take 200 mg by mouth daily.   multivitamin with minerals Tabs tablet Take 1 tablet by mouth daily.   ondansetron  4 MG tablet Commonly known as: ZOFRAN  Take 4 mg by mouth every 8 (eight) hours as needed for nausea or vomiting.   pantoprazole  40 MG tablet Commonly known as: PROTONIX  Take 1 tablet (40 mg total) by mouth at bedtime.   polyethylene glycol 17 g packet Commonly known as: MIRALAX  / GLYCOLAX  Take 17 g by mouth 2 (two) times daily. What changed:  how much to  take when to take this additional instructions   pravastatin  20 MG tablet Commonly known as: PRAVACHOL  Take 20 mg by mouth daily.   pregabalin  75 MG capsule Commonly known as: LYRICA  Take 1 capsule (75 mg total) by mouth daily.   pregabalin  150 MG capsule Commonly known as: LYRICA  Take 1 capsule (150 mg total) by mouth at bedtime.   senna 8.6 MG tablet Commonly known as: SENOKOT Take 2 tablets (17.2 mg total) by mouth 2 (two) times daily. Hold for loose stools >2/day   Vitamin D3 50 MCG (2000 UT) Tabs Take 2,000 Units by mouth daily.       Major procedures and Radiology Reports - PLEASE review detailed and final reports for all details, in brief -   US  LT LOWER EXTREM LTD SOFT TISSUE NON VASCULAR Result Date: 02/14/2024 CLINICAL DATA:  Left-sided cellulitis EXAM: ULTRASOUND RIGHT LOWER EXTREMITY LIMITED TECHNIQUE: Ultrasound examination of the lower extremity soft tissues was performed in the area of clinical concern. COMPARISON:  None Available. FINDINGS: Ill-defined subcutaneous soft tissue edema underlying the area of clinical concern in the medial left thigh. No focal fluid collection. IMPRESSION: No focal fluid collection to suggest abscess in the area of soft tissue edema. Electronically Signed   By: Limin  Xu M.D.   On: 02/14/2024 14:17   DG Chest Port 1 View Result Date: 02/12/2024 CLINICAL DATA:  Questionable sepsis - evaluate for abnormality Fever this morning.  COVID vaccine yesterday. EXAM: PORTABLE CHEST 1 VIEW COMPARISON:  Radiograph 01/09/2024 FINDINGS: Stable heart size and mediastinal contours. Unchanged left lung base scarring. No acute airspace disease. No pulmonary edema, large pleural effusion or pneumothorax. No acute osseous abnormalities are seen. IMPRESSION: Unchanged left lung base scarring. No acute chest findings. Electronically Signed   By: Chadwick Colonel M.D.   On: 02/12/2024  14:07   Micro Results  Recent Results (from the past 240 hours)  Resp  panel by RT-PCR (RSV, Flu A&B, Covid) Anterior Nasal Swab     Status: None   Collection Time: 02/12/24  1:25 PM   Specimen: Anterior Nasal Swab  Result Value Ref Range Status   SARS Coronavirus 2 by RT PCR NEGATIVE NEGATIVE Final    Comment: (NOTE) SARS-CoV-2 target nucleic acids are NOT DETECTED.  The SARS-CoV-2 RNA is generally detectable in upper respiratory specimens during the acute phase of infection. The lowest concentration of SARS-CoV-2 viral copies this assay can detect is 138 copies/mL. A negative result does not preclude SARS-Cov-2 infection and should not be used as the sole basis for treatment or other patient management decisions. A negative result may occur with  improper specimen collection/handling, submission of specimen other than nasopharyngeal swab, presence of viral mutation(s) within the areas targeted by this assay, and inadequate number of viral copies(<138 copies/mL). A negative result must be combined with clinical observations, patient history, and epidemiological information. The expected result is Negative.  Fact Sheet for Patients:  BloggerCourse.com  Fact Sheet for Healthcare Providers:  SeriousBroker.it  This test is no t yet approved or cleared by the [place]  FDA and  has been authorized for detection and/or diagnosis of SARS-CoV-2 by FDA under an Emergency Use Authorization (EUA). This EUA will remain  in effect (meaning this test can be used) for the duration of the COVID-19 declaration under Section 564(b)(1) of the Act, 21 U.S.C.section 360bbb-3(b)(1), unless the authorization is terminated  or revoked sooner.       Influenza A by PCR NEGATIVE NEGATIVE Final   Influenza B by PCR NEGATIVE NEGATIVE Final    Comment: (NOTE) The Xpert Xpress SARS-CoV-2/FLU/RSV plus assay is intended as an aid in the diagnosis of influenza from Nasopharyngeal swab specimens and should not be used as a  sole basis for treatment. Nasal washings and aspirates are unacceptable for Xpert Xpress SARS-CoV-2/FLU/RSV testing.  Fact Sheet for Patients: BloggerCourse.com  Fact Sheet for Healthcare Providers: SeriousBroker.it  This test is not yet approved or cleared by the [place]  FDA and has been authorized for detection and/or diagnosis of SARS-CoV-2 by FDA under an Emergency Use Authorization (EUA). This EUA will remain in effect (meaning this test can be used) for the duration of the COVID-19 declaration under Section 564(b)(1) of the Act, 21 U.S.C. section 360bbb-3(b)(1), unless the authorization is terminated or revoked.     Resp Syncytial Virus by PCR NEGATIVE NEGATIVE Final    Comment: (NOTE) Fact Sheet for Patients: BloggerCourse.com  Fact Sheet for Healthcare Providers: SeriousBroker.it  This test is not yet approved or cleared by the [place]  FDA and has been authorized for detection and/or diagnosis of SARS-CoV-2 by FDA under an Emergency Use Authorization (EUA). This EUA will remain in effect (meaning this test can be used) for the duration of the COVID-19 declaration under Section 564(b)(1) of the Act, 21 U.S.C. section 360bbb-3(b)(1), unless the authorization is terminated or revoked.  Performed at [facility], [address], [place], [place] [address]   Blood Culture (routine x 2)     Status: None (Preliminary result)   Collection Time: 02/12/24  1:34 PM   Specimen: Left Antecubital; Blood  Result Value Ref Range Status   Specimen Description   Final    LEFT ANTECUBITAL BOTTLES DRAWN AEROBIC AND ANAEROBIC   Special Requests Blood Culture adequate volume  Final   Culture   Final  NO GROWTH 2 DAYS Performed at [facility], [address], [place], [place] [address]    Report Status PENDING  Incomplete  Blood Culture (routine x 2)     Status: None  (Preliminary result)   Collection Time: 02/12/24  1:57 PM   Specimen: BLOOD LEFT HAND  Result Value Ref Range Status   Specimen Description BLOOD LEFT HAND AEROBIC BOTTLE ONLY  Final   Special Requests   Final    Blood Culture results may not be optimal due to an inadequate volume of blood received in culture bottles   Culture   Final    NO GROWTH 2 DAYS Performed at [facility], [address] [place], [place] [address]    Report Status PENDING  Incomplete  MRSA Next Gen by PCR, Nasal     Status: Abnormal   Collection Time: 02/12/24  5:45 PM   Specimen: Nasal Mucosa; Nasal Swab  Result Value Ref Range Status   MRSA by PCR Next Gen DETECTED (A) NOT DETECTED Final    Comment: RESULT CALLED TO, READ BACK BY AND VERIFIED WITH: RREYNOLDS,M ON 02/12/24 AT 2131 BY PURDIE,J        The GeneXpert MRSA Assay (FDA approved for NASAL specimens only), is one component of a comprehensive MRSA colonization surveillance program. It is not intended to diagnose MRSA infection nor to guide or monitor treatment for MRSA infections. Performed at [facility], [address] [place], [place] [address]    Today   Subjective    Lennox Dolberry today has no new complaints  Had BM No fever  Or chills    Patient has been seen and examined prior to discharge   Objective   Blood pressure 109/66, pulse 88, temperature 98.4 F (36.9 C), temperature source Oral, resp. rate 19, height 6' 3.5" (1.918 m), weight 109.8 kg, SpO2 100%.   Intake/Output Summary (Last 24 hours) at 02/15/2024 1044 Last data filed at 02/15/2024 1028 Gross per 24 hour  Intake 240 ml  Output 1550 ml  Net -1310 ml    Exam Gen:- Awake Alert, in no acute distress HEENT:- [place].AT, No sclera icterus Neck-Supple Neck,No JVD,.  Lungs-  CTAB , fair symmetrical air movement CV- S1, S2 normal, regular  Abd-  +ve B.Sounds, Abd Soft, No tenderness,    Psych-affect is appropriate, oriented x3 Neuro-chronic neuromuscular deficits  consistent with multiple sclerosis, no additional new focal deficits, no tremors MSK/Skin--much improved Lt Thigh area erythema, much improved Lt Thigh area warmth, much improved tenderness and swelling.    Data Review   CBC w Diff:  Lab Results  Component Value Date   WBC 9.6 02/13/2024   HGB 12.3 (L) 02/13/2024   HCT 40.3 02/13/2024   PLT 256 02/13/2024   LYMPHOPCT 5 02/12/2024   MONOPCT 4 02/12/2024   EOSPCT 0 02/12/2024   BASOPCT 0 02/12/2024   CMP:  Lab Results  Component Value Date   NA 136 02/15/2024   K 4.0 02/15/2024   CL 102 02/15/2024   CO2 27 02/15/2024   BUN 14 02/15/2024   CREATININE 1.14 02/15/2024   CREATININE 0.99 11/16/2016   PROT 8.6 (H) 02/12/2024   ALBUMIN 4.1 02/12/2024   BILITOT 0.4 02/12/2024   ALKPHOS 134 (H) 02/12/2024   AST 17 02/12/2024   ALT 13 02/12/2024   Total Discharge time is about 33 minutes  Colin Dawley M.D on 02/15/2024 at 10:44 AM  Go to [email or web address] -  for contact info  [facility] - Office  [phone]   

## 2024-02-15 NOTE — Plan of Care (Signed)
  Problem: Clinical Measurements: Goal: Will remain free from infection Outcome: Progressing   Problem: Nutrition: Goal: Adequate nutrition will be maintained Outcome: Progressing   Problem: Pain Managment: Goal: General experience of comfort will improve and/or be controlled Outcome: Progressing

## 2024-02-15 NOTE — TOC Transition Note (Signed)
 Transition of Care [facility]) - Discharge Note   Patient Details  Name: Vincent Anthony MRN: [id number] Date of Birth: 1971/06/26  [facility] CM/SW Contact:  Ander Katos, LCSW Phone Number: 02/15/2024, 11:41 AM   Clinical Narrative: Pt d/c today back to [facility]. Pt and facility aware and agreeable. Pt indicates he has notified his wife. D/C summary sent to SNF. RN given number to call report. Pt and RN request EMS. Transport arranged with [employer].       Final next level of care: Skilled Nursing Facility Barriers to Discharge: Barriers Resolved   Patient Goals and CMS Choice Patient states their goals for this hospitalization and ongoing recovery are:: I will be discharge to [facility].gov Compare Post Acute Care list provided to:: Patient Choice offered to / list presented to : Patient      Discharge Placement                Patient to be transferred to facility by: [name] Name of family member notified: pt only Patient and family notified of of transfer: 02/15/24  Discharge Plan and Services Additional resources added to the After Visit Summary for                                       Social Drivers of Health (SDOH) Interventions SDOH Screenings   Food Insecurity: No Food Insecurity (02/12/2024)  Housing: Low Risk  (02/12/2024)  Transportation Needs: No Transportation Needs (02/12/2024)  Utilities: Not At Risk (02/12/2024)  Tobacco Use: Low Risk  (02/12/2024)     Readmission Risk Interventions     No data to display

## 2024-02-15 NOTE — Discharge Instructions (Signed)
 1) repeat CBC and BMP blood test in 7 to 10 days

## 2024-02-15 NOTE — Care Management Important Message (Signed)
 Important Message  Patient Details  Name: Vincent Anthony MRN: [id number] Date of Birth: 04/01/1971   Important Message Given:  Yes - Medicare IM     [name] [name] 02/15/2024, 11:20 AM

## 2024-02-15 NOTE — Progress Notes (Signed)
 Nurse at bedside,patient alert and oriented times four. Patient has no c/o pain or discomfort at this time.Patient did state that he had a bowel movement  last night. Plan of care on going,

## 2024-02-17 LAB — CULTURE, BLOOD (ROUTINE X 2)
Culture: NO GROWTH
Culture: NO GROWTH
Special Requests: ADEQUATE

## 2024-04-04 ENCOUNTER — Encounter: Payer: Self-pay | Admitting: Internal Medicine

## 2024-04-11 ENCOUNTER — Telehealth (INDEPENDENT_AMBULATORY_CARE_PROVIDER_SITE_OTHER): Payer: Self-pay | Admitting: *Deleted

## 2024-04-11 NOTE — Telephone Encounter (Signed)
 Voice mail on Vincent Anthony's phone 7/14 at 1:31  He received a letter and he cannot make his own appts since he is in [facility] they provide his transportation  Ask for what sounded like Nena there to make his appointments.  He did not leave their #

## 2024-05-15 ENCOUNTER — Telehealth: Payer: Self-pay

## 2024-05-15 NOTE — Progress Notes (Unsigned)
 GI Office Note    Referring Provider: No ref. provider found Primary Care Physician:  System, Provider Not In Primary Gastroenterologist: Lamar HERO.Rourk, MD  Date:  05/16/2024  ID:  Vincent Anthony, DOB 05-Aug-1971, MRN [id number]   Chief Complaint   Chief Complaint  Patient presents with   Follow-up    Follow up---screening for colonoscopy   History of Present Illness  Vincent Anthony is a 53 y.o. male with a history of GERD, MS, neurogenic bladder, and history of elevated LFTs presenting today for follow up post colonoscopy and Cologuard.   Prior workup of elevated LFTs: Previously on Gilenya for his MS which is known to potentially increase LFTs by 10-15%. PCP felt like his elevation was secondary to medication effect. Prior workup negative for viral etiology, ANA mildly elevated with titer 1:80 but not surprisingly given he has MS. Other autoimmune labs negative. RUQ U/S most consistent with fatty liver.   Prior to consultation with [facility] he was seen by a GI at [facility] and was being treated with hycosamine for abdominal cramping. He had began taking his Gilenya every other day and continued to have an elevation in LFTs with normal bilirubin. Medication adjustments deferred to neurology. He had multiple checks of his LFTs revealing some normalization of LFTs and then another bump including increase in Alk Phos. His last in person office visit was 01/05/17 and was no show for appointment in July 2018.   VV 07/10/20 for follow up and medication refills. At this visit he reported that he was doing fairly well in regards to his LFTs and MS. He was on Ocrevus IV. His abdominal pain and colon spasms were controlled on hycosamine BID. He denied any other GI complaints or URI symptoms. He had recently been following with ortho due to knee and ankle sprains post fall x2.   Colonoscopy 08/22/2013 with [facility]). Normal.   OV 03/02/2022.  Patient denied any changes in bowel  habits, melena, bright BPR.  Was on Linzess  145 mcg daily and Senokot and having a bowel movement every other day.  Abdominal pain controlled with Levbid .  Denies any weight loss, early satiety, or lack of appetite.  Reflux well-controlled on pantoprazole  40 mg daily.  Scheduled for colonoscopy with Dr. Shaaron.  Advised to continue Linzess  and Senokot prior to procedure.  Continue pantoprazole  40 mg once daily.   Colonoscopy 05/01/2022: -Poor prep -Markedly capacious, redundant, and elongated colon -Rectum and sigmoid segments not seen well -Advised flexible sigmoidoscopy in 1 year   Last office visit August 2024.  Was reportedly taking Amitiza  twice daily as well as Senokot once daily.  He had reported that in December 2023 he had a bowel blockage and for that reason he was taken off of Linzess  given history of prior ileus.  He denied any overt abdominal pain but did report some discomfort that does improve after bowel movements.  Denied any melena or BRBPR.  Taking pantoprazole  daily for reflux.  No complaints of dysphagia.  Per review of his med list at that time it appeared he was taking Levsin  twice daily frequently although some days not taking it at all.  Doing PT/OT in his facility. Scheduled for flex sig and advised Amitiza  8 mcg BID, Pantoprazole  40 mg nightly, senna nightly, and advised to start miralax  1/2 capful every other day.   Flexible sigmoidoscopy Nov 2024: - Preparation of the colon was inadequate.  - Viscous liquid stool and solid stool throughout the left  side.  - Future attempts at optical screening will likely not be productive. - Advised Cologuard.   Cologuard negative February 2025.   Hospitalization for sepsis in May 2025. Labs with mildly elevated alkaline phosphatase at 134.  Was previously normal in January 2025.  Also had some mild AKI at that time as well.  Received botox injections for muscle spasticity yesterday 8/18 with neurology.   Today:  Discussed the use  of AI scribe software for clinical note transcription with the patient, who gave verbal consent to proceed.  He discusses his bowel movements, taking a pink pill in the morning, two Senokot, and Miralax  at night. Per review of his MAR   He drinks water regularly and notes that his bowel movements are liquid but improving. He prefers colonoscopy over Cologuard for future screenings if the preparation is managed correctly.  He recounts a previous colonoscopy attempt where the preparation was not completed properly due to timing issues with the administration of the prep solution, leading to an incomplete procedure. He was advised to try Cologuard. The first Cologuard test was rejected due to labeling issues, and the second was not accepted because it was not received in time. The third attempt was successful and returned negative results.  He recalls a hospitalization in May for MRSA, which was discovered after he experienced a high fever and was admitted for treatment. He was treated with antibiotics and discharged after a few days. He mentions a past experience with hand, foot, and mouth disease, which he attributes to contact with children, experiencing symptoms such as bumps on his head and red spots on his hands, treated with steroids and Benadryl .  He has a history of upper respiratory infections and has had multiple COVID-19 infections while in a care facility. He is cautious about wearing a mask when others do. He is currently taking pantoprazole  for acid reflux, which he was unaware of until it was mentioned. He has a history of UTIs related to catheter use and has had a blood transfusion due to prolonged catheterization.  He reports that his bowel movements are liquid but improving.       Wt Readings from Last 5 Encounters:  05/16/24 244 lb 9.6 oz (110.9 kg)  02/12/24 242 lb (109.8 kg)  10/11/23 240 lb 4.8 oz (109 kg)  08/16/23 241 lb (109.3 kg)  08/11/23 (P) 240 lb 15.4 oz (109.3 kg)     Current Outpatient Medications  Medication Sig Dispense Refill   amitriptyline  (ELAVIL ) 10 MG tablet Take 1 tablet (10 mg total) by mouth at bedtime. Hold until linezolid  course has finished     artificial tears (LACRILUBE) OINT ophthalmic ointment Place into both eyes every 4 (four) hours as needed for dry eyes.     baclofen  (LIORESAL ) 10 MG tablet Take 1 tablet by mouth 3 (three) times daily.     bisacodyl  5 MG EC tablet As directed 30 tablet 3   cetirizine (ZYRTEC) 10 MG tablet Take 10 mg by mouth daily.     Cholecalciferol  (VITAMIN D3) 50 MCG (2000 UT) TABS Take 2,000 Units by mouth daily.     clonazePAM  (KLONOPIN ) 1 MG tablet Take 1 tablet (1 mg total) by mouth 2 (two) times daily. 30 tablet 0   Emollient (EUCERIN) lotion Apply 1 Application topically every 12 (twelve) hours as needed for dry skin.     famotidine  (PEPCID ) 20 MG tablet Take 20 mg by mouth 2 (two) times daily.     fluticasone  (FLONASE ) 50  MCG/ACT nasal spray Place 1 spray into both nostrils daily.     hyoscyamine  (LEVBID ) 0.375 MG 12 hr tablet TAKE (1) TABLET BYMOUTH EVERY TWELVE HOURS. 60 tablet 11   ketoconazole (NIZORAL) 2 % cream SMARTSIG:1 Application Topical 1 to 2 Times Daily     levETIRAcetam  (KEPPRA ) 250 MG tablet Take 250 mg by mouth 2 (two) times daily.     lubiprostone  (AMITIZA ) 8 MCG capsule Take by mouth.     metFORMIN (GLUCOPHAGE) 500 MG tablet Take 500 mg by mouth 2 (two) times daily.     modafinil  (PROVIGIL ) 200 MG tablet Take 200 mg by mouth daily.     Multiple Vitamin (MULTIVITAMIN WITH MINERALS) TABS tablet Take 1 tablet by mouth daily.     mupirocin  ointment (BACTROBAN ) 2 %      ondansetron  (ZOFRAN ) 4 MG tablet Take 4 mg by mouth every 8 (eight) hours as needed for nausea or vomiting.     pantoprazole  (PROTONIX ) 40 MG tablet Take 1 tablet (40 mg total) by mouth at bedtime. 30 tablet    polyethylene glycol (MIRALAX  / GLYCOLAX ) 17 g packet Take 17 g by mouth 2 (two) times daily. 60 each 4    pravastatin  (PRAVACHOL ) 20 MG tablet Take 20 mg by mouth daily.     pregabalin  (LYRICA ) 150 MG capsule Take 1 capsule (150 mg total) by mouth at bedtime. 30 capsule 0   pregabalin  (LYRICA ) 75 MG capsule Take 1 capsule (75 mg total) by mouth daily. 30 capsule 0   rosuvastatin (CRESTOR) 5 MG tablet Take 5 mg by mouth daily.     senna (SENOKOT) 8.6 MG tablet Take 2 tablets (17.2 mg total) by mouth 2 (two) times daily. Hold for loose stools >2/day 120 tablet 2   No current facility-administered medications for this visit.    Past Medical History:  Diagnosis Date   Arthritis    Depression    Diabetes mellitus without complication (HCC)    Elevated LFTs    Elevated liver enzymes    GERD (gastroesophageal reflux disease)    Headache    MS (multiple sclerosis) (HCC)    Neurogenic bladder    Neuromuscular disorder (HCC)    Tremor     Past Surgical History:  Procedure Laterality Date   COLONOSCOPY  2014   COLONOSCOPY WITH PROPOFOL  N/A 05/01/2022   Procedure: COLONOSCOPY WITH PROPOFOL ;  Surgeon: Shaaron Lamar HERO, MD;  Location: [facility];  Service: Endoscopy;  Laterality: N/A;  9:15am   FLEXIBLE SIGMOIDOSCOPY N/A 08/11/2023   Procedure: FLEXIBLE SIGMOIDOSCOPY;  Surgeon: Shaaron Lamar HERO, MD;  Location: [facility];  Service: Endoscopy;  Laterality: N/A;  11:00am, asa 3 (at [facility])   HERNIA REPAIR     KNEE SURGERY     TESTICLE REMOVAL      Family History  Problem Relation Age of Onset   Heart attack Mother    Diabetes Father    Hypertension Father    Colon cancer Neg Hx    Colonic polyp Neg Hx     Allergies as of 05/16/2024 - Review Complete 05/16/2024  Allergen Reaction Noted   Tizanidine Nausea Only 11/09/2002   Celebrex [celecoxib] Nausea And Vomiting 03/04/2013   Clindamycin Diarrhea 09/14/2012   Vioxx [rofecoxib] Nausea And Vomiting 03/04/2013   Zanaflex [tizanidine hcl] Nausea And Vomiting 11/30/2014    Social History   Socioeconomic History   Marital  status: Married    Spouse name: Not on file   Number of children: Not  on file   Years of education: Not on file   Highest education level: Not on file  Occupational History   Not on file  Tobacco Use   Smoking status: Never   Smokeless tobacco: Never  Substance and Sexual Activity   Alcohol use: No    Comment: occasionally   Drug use: No   Sexual activity: Not on file  Other Topics Concern   Not on file  Social History Narrative   Not on file   Social Drivers of Health   Financial Resource Strain: Not on file  Food Insecurity: No Food Insecurity (02/12/2024)   Hunger Vital Sign    Worried About Running Out of Food in the Last Year: Never true    Ran Out of Food in the Last Year: Never true  Transportation Needs: No Transportation Needs (02/12/2024)   PRAPARE - Administrator, Civil Service (Medical): No    Lack of Transportation (Non-Medical): No  Physical Activity: Not on file  Stress: Not on file  Social Connections: Not on file     Review of Systems   Gen: Denies fever, chills, anorexia. Denies fatigue, weakness, weight loss.  CV: Denies chest pain, palpitations, syncope, peripheral edema, and claudication. Resp: Denies dyspnea at rest, cough, wheezing, coughing up blood, and pleurisy. GI: See HPI Derm: Denies rash, itching, dry skin Psych: Denies depression, anxiety, memory loss, confusion. No homicidal or suicidal ideation.  Heme: Denies bruising, bleeding, and enlarged lymph nodes.  Physical Exam   BP 108/74   Pulse 89   Temp 98.6 F (37 C)   Ht 6' 3 (1.905 m)   Wt 244 lb 9.6 oz (110.9 kg)   BMI 30.57 kg/m   General:   Alert and oriented. No distress noted. Pleasant and cooperative.  Head:  Normocephalic and atraumatic. Eyes:  Conjuctiva clear without scleral icterus. Abdomen:  +BS, soft, non-tender and non-distended. No rebound or guarding. No HSM or masses noted. Rectal: deferred Msk: in motorized wheelchair. Right side spasticity and  mild contracture.  Extremities:  Without edema. Brace to RLE.  Neurologic:  Alert and  oriented x4 Psych:  Alert and cooperative. Normal mood and affect.  Assessment & Plan  Vincent Anthony is a 53 y.o. male presenting today for follow-up of constipation and reflux and discuss need for repeat colonoscopy versus Cologuard.    Chronic constipation Chronic constipation managed with Senokot, Miralax , and Amitiza . Bowel movements are liquidy without abdominal pain or blood in stool. Current regimen appears effective with adequate hydration and no significant issues reported.  Constipation is likely - Continue Senokot 2 tablets twice daily, Miralax  17g twice daily, and Amitiza  8 mcg twice daily. - Goal 64-80oz water daily.  - If any signs of recurrent small bowel obstruction then will need to stop Amitiza   Gastroesophageal reflux disease (GERD) GERD managed with pantoprazole  (Protonix ). No current symptoms of reflux reported. Pantoprazole  is used to protect the stomach lining and manage acid reflux. - Continue pantoprazole  (Protonix ) 40 mg for GERD management.     He reports a previous colonoscopy several years ago that was negative without any polyps.  Multiple attempts at colonoscopy and flexible sigmoidoscopy over the last 1-2 years with incomplete clearance of stool.  Cologuard in February of this year negative.  He is low risk given no significant family history of colon cancer or colon polyps.  He would like to consider repeat colonoscopy again in the future.  We discussed that we can revisit this in  1-2 years as long as he remains without alarm symptoms.  Follow up    Follow up 1-2 years.     Charmaine Melia, MSN, FNP-BC, AGACNP-BC [facility]

## 2024-05-15 NOTE — Telephone Encounter (Signed)
 Tried Research officer, trade [employer] at [facility] with no answer to rescheduled pt appt.

## 2024-05-16 ENCOUNTER — Ambulatory Visit (INDEPENDENT_AMBULATORY_CARE_PROVIDER_SITE_OTHER): Admitting: Gastroenterology

## 2024-05-16 ENCOUNTER — Encounter: Payer: Self-pay | Admitting: Gastroenterology

## 2024-05-16 VITALS — BP 108/74 | HR 89 | Temp 98.6°F | Ht 75.0 in | Wt 244.6 lb

## 2024-05-16 DIAGNOSIS — K5904 Chronic idiopathic constipation: Secondary | ICD-10-CM

## 2024-05-16 DIAGNOSIS — K5909 Other constipation: Secondary | ICD-10-CM | POA: Diagnosis not present

## 2024-05-16 DIAGNOSIS — K219 Gastro-esophageal reflux disease without esophagitis: Secondary | ICD-10-CM

## 2024-05-16 DIAGNOSIS — R1084 Generalized abdominal pain: Secondary | ICD-10-CM

## 2024-05-16 NOTE — Patient Instructions (Signed)
 VISIT SUMMARY: You came in today for a follow-up regarding your gastrointestinal health and issues with colonoscopy preparation. We discussed your current bowel movement routine and your preference for future colonoscopy screenings. We also reviewed your past medical history, including your hospitalization for MRSA, experiences with hand, foot, and mouth disease, upper respiratory infections, and your current medications.  YOUR PLAN: -CHRONIC CONSTIPATION: Chronic constipation means having infrequent or difficult bowel movements. Your current regimen of Senokot, Miralax , and Amitiza  is helping to manage your symptoms, and you should continue taking these medications as prescribed. Make sure to stay hydrated by drinking plenty of water.  -GASTROESOPHAGEAL REFLUX DISEASE (GERD): GERD is a condition where stomach acid frequently flows back into the tube connecting your mouth and stomach, causing irritation. You are currently managing this with pantoprazole  (Protonix ), which helps protect your stomach lining and control acid reflux. Continue taking pantoprazole  as prescribed.  INSTRUCTIONS: Please continue with your current medications for chronic constipation and GERD. Ensure you stay hydrated. If you experience any new symptoms or have concerns, schedule a follow-up appointment.  We will plan to follow up in 1-2 years and discuss Cologuard vs colonoscopy again.   It was a pleasure to see you today. I want to create trusting relationships with patients. If you receive a survey regarding your visit,  I greatly appreciate you taking time to fill this out on paper or through your MyChart. I value your feedback.  Charmaine Melia, MSN, FNP-BC, AGACNP-BC [facility]   Contains text generated by [employer].

## 2024-09-14 ENCOUNTER — Ambulatory Visit: Payer: Medicare HMO | Admitting: Urology

## 2024-10-04 ENCOUNTER — Telehealth: Payer: Self-pay

## 2024-10-04 NOTE — Telephone Encounter (Signed)
 Patient called and left a voicemail stating he needed a f/u appointment I attempted to call patient but he did not answer unable to leave voicemail

## 2024-10-09 NOTE — Telephone Encounter (Signed)
 Tried following up to get pt scheduled with no answer. LVM for return call.

## 2025-02-16 ENCOUNTER — Ambulatory Visit: Admitting: Urology
